# Patient Record
Sex: Female | Born: 1940
Health system: Southern US, Community
[De-identification: ages and names within clinical notes are randomized; demographics above are authoritative.]

## PROBLEM LIST (undated history)

## (undated) DIAGNOSIS — M48062 Spinal stenosis, lumbar region with neurogenic claudication: Secondary | ICD-10-CM

## (undated) DIAGNOSIS — K1379 Other lesions of oral mucosa: Secondary | ICD-10-CM

## (undated) DIAGNOSIS — Z9889 Other specified postprocedural states: Secondary | ICD-10-CM

## (undated) DIAGNOSIS — M79606 Pain in leg, unspecified: Secondary | ICD-10-CM

## (undated) DIAGNOSIS — F419 Anxiety disorder, unspecified: Secondary | ICD-10-CM

## (undated) DIAGNOSIS — C4490 Unspecified malignant neoplasm of skin, unspecified: Secondary | ICD-10-CM

## (undated) DIAGNOSIS — M542 Cervicalgia: Secondary | ICD-10-CM

## (undated) DIAGNOSIS — I10 Essential (primary) hypertension: Secondary | ICD-10-CM

## (undated) DIAGNOSIS — M545 Low back pain, unspecified: Secondary | ICD-10-CM

## (undated) DIAGNOSIS — M719 Bursopathy, unspecified: Secondary | ICD-10-CM

## (undated) DIAGNOSIS — G43909 Migraine, unspecified, not intractable, without status migrainosus: Secondary | ICD-10-CM

## (undated) DIAGNOSIS — R112 Nausea with vomiting, unspecified: Secondary | ICD-10-CM

## (undated) DIAGNOSIS — H269 Unspecified cataract: Secondary | ICD-10-CM

## (undated) DIAGNOSIS — K219 Gastro-esophageal reflux disease without esophagitis: Secondary | ICD-10-CM

## (undated) DIAGNOSIS — M7580 Other shoulder lesions, unspecified shoulder: Secondary | ICD-10-CM

## (undated) DIAGNOSIS — T8859XA Other complications of anesthesia, initial encounter: Secondary | ICD-10-CM

## (undated) DIAGNOSIS — E039 Hypothyroidism, unspecified: Secondary | ICD-10-CM

## (undated) DIAGNOSIS — E079 Disorder of thyroid, unspecified: Secondary | ICD-10-CM

## (undated) DIAGNOSIS — M438X9 Other specified deforming dorsopathies, site unspecified: Secondary | ICD-10-CM

## (undated) DIAGNOSIS — M4120 Other idiopathic scoliosis, site unspecified: Secondary | ICD-10-CM

## (undated) DIAGNOSIS — Z8489 Family history of other specified conditions: Secondary | ICD-10-CM

## (undated) DIAGNOSIS — G8929 Other chronic pain: Secondary | ICD-10-CM

## (undated) DIAGNOSIS — T4145XA Adverse effect of unspecified anesthetic, initial encounter: Secondary | ICD-10-CM

## (undated) DIAGNOSIS — C029 Malignant neoplasm of tongue, unspecified: Secondary | ICD-10-CM

## (undated) DIAGNOSIS — N8110 Cystocele, unspecified: Secondary | ICD-10-CM

## (undated) DIAGNOSIS — M1990 Unspecified osteoarthritis, unspecified site: Secondary | ICD-10-CM

## (undated) DIAGNOSIS — H9190 Unspecified hearing loss, unspecified ear: Secondary | ICD-10-CM

## (undated) DIAGNOSIS — M549 Dorsalgia, unspecified: Secondary | ICD-10-CM

## (undated) DIAGNOSIS — M5416 Radiculopathy, lumbar region: Secondary | ICD-10-CM

## (undated) HISTORY — DX: Other specified deforming dorsopathies, site unspecified: M43.8X9

## (undated) HISTORY — PX: TONGUE SURGERY: SHX810

## (undated) HISTORY — DX: Unspecified hearing loss, unspecified ear: H91.90

## (undated) HISTORY — DX: Bursopathy, unspecified: M71.9

## (undated) HISTORY — PX: OTHER SURGICAL HISTORY: SHX169

## (undated) HISTORY — PX: NASAL POLYP EXCISION: SHX2068

## (undated) HISTORY — PX: APPENDECTOMY: SHX54

## (undated) HISTORY — PX: CATARACT EXTRACTION, BILATERAL: SHX1313

## (undated) HISTORY — DX: Cervicalgia: M54.2

## (undated) HISTORY — DX: Dorsalgia, unspecified: M54.9

## (undated) HISTORY — DX: Spinal stenosis, lumbar region with neurogenic claudication: M48.062

## (undated) HISTORY — DX: Unspecified cataract: H26.9

## (undated) HISTORY — DX: Cystocele, unspecified: N81.10

## (undated) HISTORY — DX: Other shoulder lesions, unspecified shoulder: M75.80

## (undated) HISTORY — DX: Gastro-esophageal reflux disease without esophagitis: K21.9

## (undated) HISTORY — PX: BACK SURGERY: SHX140

## (undated) HISTORY — PX: TONSILLECTOMY: SUR1361

## (undated) HISTORY — PX: ABDOMINOPLASTY: SUR9

## (undated) HISTORY — DX: Hypothyroidism, unspecified: E03.9

## (undated) HISTORY — DX: Unspecified osteoarthritis, unspecified site: M19.90

## (undated) HISTORY — DX: Other lesions of oral mucosa: K13.79

## (undated) HISTORY — DX: Radiculopathy, lumbar region: M54.16

## (undated) HISTORY — PX: COLONOSCOPY: SHX174

## (undated) HISTORY — DX: Pain in leg, unspecified: M79.606

## (undated) HISTORY — DX: Disorder of thyroid, unspecified: E07.9

## (undated) HISTORY — PX: CARPAL TUNNEL RELEASE: SHX101

## (undated) HISTORY — DX: Other idiopathic scoliosis, site unspecified: M41.20

## (undated) HISTORY — DX: Essential (primary) hypertension: I10

## (undated) HISTORY — PX: ABDOMINAL HYSTERECTOMY: SHX81

---

## 1978-02-26 HISTORY — PX: BREAST SURGERY: SHX581

## 1986-02-26 HISTORY — PX: LUMBAR SPINE SURGERY: SHX701

## 1998-06-20 ENCOUNTER — Other Ambulatory Visit: Admission: RE | Admit: 1998-06-20 | Discharge: 1998-06-20 | Payer: Self-pay | Admitting: Otolaryngology

## 1999-03-20 ENCOUNTER — Other Ambulatory Visit: Admission: RE | Admit: 1999-03-20 | Discharge: 1999-03-20 | Payer: Self-pay | Admitting: Family Medicine

## 1999-09-23 ENCOUNTER — Emergency Department (HOSPITAL_COMMUNITY): Admission: EM | Admit: 1999-09-23 | Discharge: 1999-09-23 | Payer: Self-pay

## 1999-11-13 ENCOUNTER — Other Ambulatory Visit: Admission: RE | Admit: 1999-11-13 | Discharge: 1999-11-13 | Payer: Self-pay | Admitting: Gastroenterology

## 1999-11-13 ENCOUNTER — Encounter (INDEPENDENT_AMBULATORY_CARE_PROVIDER_SITE_OTHER): Payer: Self-pay

## 1999-12-06 ENCOUNTER — Emergency Department (HOSPITAL_COMMUNITY): Admission: EM | Admit: 1999-12-06 | Discharge: 1999-12-06 | Payer: Self-pay | Admitting: Emergency Medicine

## 1999-12-06 ENCOUNTER — Encounter: Payer: Self-pay | Admitting: Emergency Medicine

## 1999-12-07 ENCOUNTER — Encounter: Payer: Self-pay | Admitting: Emergency Medicine

## 1999-12-07 ENCOUNTER — Ambulatory Visit (HOSPITAL_COMMUNITY): Admission: RE | Admit: 1999-12-07 | Discharge: 1999-12-07 | Payer: Self-pay | Admitting: Emergency Medicine

## 1999-12-12 ENCOUNTER — Inpatient Hospital Stay (HOSPITAL_COMMUNITY): Admission: RE | Admit: 1999-12-12 | Discharge: 1999-12-15 | Payer: Self-pay | Admitting: Neurosurgery

## 1999-12-12 ENCOUNTER — Encounter: Payer: Self-pay | Admitting: Neurosurgery

## 1999-12-13 ENCOUNTER — Encounter: Payer: Self-pay | Admitting: Neurosurgery

## 2000-06-26 ENCOUNTER — Other Ambulatory Visit: Admission: RE | Admit: 2000-06-26 | Discharge: 2000-06-26 | Payer: Self-pay | Admitting: Family Medicine

## 2003-07-14 ENCOUNTER — Other Ambulatory Visit: Admission: RE | Admit: 2003-07-14 | Discharge: 2003-07-14 | Payer: Self-pay | Admitting: Family Medicine

## 2004-06-29 ENCOUNTER — Ambulatory Visit: Payer: Self-pay | Admitting: Family Medicine

## 2004-07-06 ENCOUNTER — Ambulatory Visit: Payer: Self-pay | Admitting: Family Medicine

## 2004-07-06 ENCOUNTER — Other Ambulatory Visit: Admission: RE | Admit: 2004-07-06 | Discharge: 2004-07-06 | Payer: Self-pay | Admitting: Family Medicine

## 2004-10-19 ENCOUNTER — Ambulatory Visit: Payer: Self-pay | Admitting: Family Medicine

## 2005-07-02 ENCOUNTER — Ambulatory Visit: Payer: Self-pay | Admitting: Family Medicine

## 2005-07-02 ENCOUNTER — Emergency Department (HOSPITAL_COMMUNITY): Admission: EM | Admit: 2005-07-02 | Discharge: 2005-07-03 | Payer: Self-pay | Admitting: Emergency Medicine

## 2005-07-05 ENCOUNTER — Ambulatory Visit: Payer: Self-pay | Admitting: Family Medicine

## 2005-07-19 ENCOUNTER — Ambulatory Visit: Payer: Self-pay | Admitting: Family Medicine

## 2005-07-26 ENCOUNTER — Other Ambulatory Visit: Admission: RE | Admit: 2005-07-26 | Discharge: 2005-07-26 | Payer: Self-pay | Admitting: Family Medicine

## 2005-07-26 ENCOUNTER — Encounter: Payer: Self-pay | Admitting: Family Medicine

## 2005-07-26 ENCOUNTER — Ambulatory Visit: Payer: Self-pay | Admitting: Family Medicine

## 2005-07-26 LAB — CONVERTED CEMR LAB

## 2005-10-11 ENCOUNTER — Ambulatory Visit: Payer: Self-pay | Admitting: Family Medicine

## 2005-10-17 ENCOUNTER — Ambulatory Visit: Payer: Self-pay | Admitting: Cardiology

## 2005-10-31 ENCOUNTER — Ambulatory Visit: Payer: Self-pay | Admitting: Family Medicine

## 2006-07-11 ENCOUNTER — Ambulatory Visit: Payer: Self-pay | Admitting: Family Medicine

## 2006-08-15 ENCOUNTER — Encounter: Payer: Self-pay | Admitting: Family Medicine

## 2006-08-15 ENCOUNTER — Ambulatory Visit: Payer: Self-pay | Admitting: Family Medicine

## 2006-08-15 DIAGNOSIS — G5600 Carpal tunnel syndrome, unspecified upper limb: Secondary | ICD-10-CM | POA: Insufficient documentation

## 2006-08-15 LAB — CONVERTED CEMR LAB
ALT: 18 units/L (ref 0–40)
AST: 24 units/L (ref 0–37)
Albumin: 4.2 g/dL (ref 3.5–5.2)
Alkaline Phosphatase: 67 units/L (ref 39–117)
BUN: 12 mg/dL (ref 6–23)
Basophils Absolute: 0.1 10*3/uL (ref 0.0–0.1)
Basophils Relative: 1.1 % — ABNORMAL HIGH (ref 0.0–1.0)
Bilirubin, Direct: 0.1 mg/dL (ref 0.0–0.3)
CO2: 30 meq/L (ref 19–32)
Calcium: 9.5 mg/dL (ref 8.4–10.5)
Chloride: 106 meq/L (ref 96–112)
Cholesterol: 222 mg/dL (ref 0–200)
Creatinine, Ser: 0.7 mg/dL (ref 0.4–1.2)
Direct LDL: 154 mg/dL
Eosinophils Absolute: 0.7 10*3/uL — ABNORMAL HIGH (ref 0.0–0.6)
Eosinophils Relative: 9.1 % — ABNORMAL HIGH (ref 0.0–5.0)
GFR calc Af Amer: 109 mL/min
GFR calc non Af Amer: 90 mL/min
Glucose, Bld: 80 mg/dL (ref 70–99)
HCT: 42 % (ref 36.0–46.0)
HDL: 41.7 mg/dL (ref >39.0)
Hemoglobin: 14.2 g/dL (ref 12.0–15.0)
Lymphocytes Relative: 21.3 % (ref 12.0–46.0)
MCHC: 33.8 g/dL (ref 30.0–36.0)
MCV: 89.1 fL (ref 78.0–100.0)
Monocytes Absolute: 0.7 10*3/uL (ref 0.2–0.7)
Monocytes Relative: 9.7 % (ref 3.0–11.0)
Neutro Abs: 4.2 10*3/uL (ref 1.4–7.7)
Neutrophils Relative %: 58.8 % (ref 43.0–77.0)
Platelets: 331 10*3/uL (ref 150–400)
Potassium: 3.5 meq/L (ref 3.5–5.1)
RBC: 4.71 M/uL (ref 3.87–5.11)
RDW: 12.8 % (ref 11.5–14.6)
Sodium: 141 meq/L (ref 135–145)
TSH: 2.06 microintl units/mL (ref 0.35–5.50)
Total Bilirubin: 0.6 mg/dL (ref 0.3–1.2)
Total CHOL/HDL Ratio: 5.3
Total Protein: 8 g/dL (ref 6.0–8.3)
Triglycerides: 115 mg/dL (ref 0–149)
VLDL: 23 mg/dL (ref 0–40)
Vit D, 1,25-Dihydroxy: 44 (ref 20–57)
WBC: 7.2 10*3/uL (ref 4.5–10.5)

## 2006-09-20 ENCOUNTER — Telehealth: Payer: Self-pay | Admitting: Family Medicine

## 2006-09-24 ENCOUNTER — Telehealth: Payer: Self-pay | Admitting: Family Medicine

## 2006-12-05 ENCOUNTER — Encounter: Payer: Self-pay | Admitting: Family Medicine

## 2007-08-14 ENCOUNTER — Ambulatory Visit: Payer: Self-pay | Admitting: Family Medicine

## 2007-08-14 LAB — CONVERTED CEMR LAB
Bilirubin Urine: NEGATIVE
Glucose, Urine, Semiquant: NEGATIVE
Ketones, urine, test strip: NEGATIVE
Specific Gravity, Urine: 1.01
pH: 6.5

## 2007-08-20 ENCOUNTER — Ambulatory Visit: Payer: Self-pay | Admitting: Family Medicine

## 2007-08-20 DIAGNOSIS — R51 Headache: Secondary | ICD-10-CM | POA: Insufficient documentation

## 2007-08-20 DIAGNOSIS — R609 Edema, unspecified: Secondary | ICD-10-CM | POA: Insufficient documentation

## 2007-08-20 DIAGNOSIS — E039 Hypothyroidism, unspecified: Secondary | ICD-10-CM | POA: Insufficient documentation

## 2007-08-20 DIAGNOSIS — R519 Headache, unspecified: Secondary | ICD-10-CM | POA: Insufficient documentation

## 2007-08-20 DIAGNOSIS — H8309 Labyrinthitis, unspecified ear: Secondary | ICD-10-CM | POA: Insufficient documentation

## 2007-08-20 LAB — CONVERTED CEMR LAB
Albumin: 3.8 g/dL (ref 3.5–5.2)
Bilirubin, Direct: 0.1 mg/dL (ref 0.0–0.3)
Calcium: 9.3 mg/dL (ref 8.4–10.5)
Cholesterol: 192 mg/dL (ref 0–200)
Eosinophils Absolute: 0.7 10*3/uL (ref 0.0–0.7)
GFR calc Af Amer: 93 mL/min
GFR calc non Af Amer: 77 mL/min
Glucose, Bld: 85 mg/dL (ref 70–99)
HCT: 41.7 % (ref 36.0–46.0)
HDL: 34.8 mg/dL — ABNORMAL LOW (ref >39.0)
Hemoglobin: 14.3 g/dL (ref 12.0–15.0)
MCHC: 34.3 g/dL (ref 30.0–36.0)
MCV: 89.1 fL (ref 78.0–100.0)
Monocytes Absolute: 0.7 10*3/uL (ref 0.1–1.0)
Monocytes Relative: 8.8 % (ref 3.0–12.0)
Neutro Abs: 4.9 10*3/uL (ref 1.4–7.7)
RDW: 12.4 % (ref 11.5–14.6)
Sodium: 139 meq/L (ref 135–145)
Total CHOL/HDL Ratio: 5.5
Total Protein: 7 g/dL (ref 6.0–8.3)
Triglycerides: 109 mg/dL (ref 0–149)

## 2008-06-28 ENCOUNTER — Ambulatory Visit: Payer: Self-pay | Admitting: Family Medicine

## 2008-06-28 DIAGNOSIS — M542 Cervicalgia: Secondary | ICD-10-CM | POA: Insufficient documentation

## 2008-06-28 DIAGNOSIS — M1990 Unspecified osteoarthritis, unspecified site: Secondary | ICD-10-CM | POA: Insufficient documentation

## 2008-06-28 DIAGNOSIS — I10 Essential (primary) hypertension: Secondary | ICD-10-CM | POA: Insufficient documentation

## 2008-07-15 ENCOUNTER — Ambulatory Visit: Payer: Self-pay | Admitting: Family Medicine

## 2008-07-15 DIAGNOSIS — M479 Spondylosis, unspecified: Secondary | ICD-10-CM | POA: Insufficient documentation

## 2008-07-17 ENCOUNTER — Encounter: Admission: RE | Admit: 2008-07-17 | Discharge: 2008-07-17 | Payer: Self-pay | Admitting: Family Medicine

## 2008-07-22 ENCOUNTER — Telehealth: Payer: Self-pay | Admitting: Family Medicine

## 2008-08-18 ENCOUNTER — Ambulatory Visit: Payer: Self-pay | Admitting: Family Medicine

## 2008-08-18 LAB — CONVERTED CEMR LAB
Bilirubin Urine: NEGATIVE
Glucose, Urine, Semiquant: NEGATIVE
pH: 6.5

## 2008-08-25 ENCOUNTER — Other Ambulatory Visit: Admission: RE | Admit: 2008-08-25 | Discharge: 2008-08-25 | Payer: Self-pay | Admitting: Family Medicine

## 2008-08-25 ENCOUNTER — Ambulatory Visit: Payer: Self-pay | Admitting: Family Medicine

## 2008-08-25 ENCOUNTER — Encounter: Payer: Self-pay | Admitting: Family Medicine

## 2008-08-25 DIAGNOSIS — R635 Abnormal weight gain: Secondary | ICD-10-CM | POA: Insufficient documentation

## 2008-08-25 DIAGNOSIS — R252 Cramp and spasm: Secondary | ICD-10-CM | POA: Insufficient documentation

## 2008-08-25 DIAGNOSIS — G4700 Insomnia, unspecified: Secondary | ICD-10-CM | POA: Insufficient documentation

## 2008-08-26 LAB — CONVERTED CEMR LAB
BUN: 22 mg/dL (ref 6–23)
Basophils Relative: 0.1 % (ref 0.0–3.0)
Bilirubin, Direct: 0 mg/dL (ref 0.0–0.3)
CO2: 29 meq/L (ref 19–32)
Chloride: 104 meq/L (ref 96–112)
Cholesterol: 228 mg/dL — ABNORMAL HIGH (ref 0–200)
Creatinine, Ser: 0.8 mg/dL (ref 0.4–1.2)
Direct LDL: 177.5 mg/dL
Eosinophils Absolute: 0.8 10*3/uL — ABNORMAL HIGH (ref 0.0–0.7)
Glucose, Bld: 90 mg/dL (ref 70–99)
MCHC: 35 g/dL (ref 30.0–36.0)
MCV: 88.5 fL (ref 78.0–100.0)
Monocytes Absolute: 0.6 10*3/uL (ref 0.1–1.0)
Neutrophils Relative %: 62.1 % (ref 43.0–77.0)
Platelets: 292 10*3/uL (ref 150.0–400.0)
Total Bilirubin: 0.6 mg/dL (ref 0.3–1.2)
Total Protein: 7.1 g/dL (ref 6.0–8.3)

## 2008-09-03 ENCOUNTER — Encounter: Payer: Self-pay | Admitting: Family Medicine

## 2008-09-07 ENCOUNTER — Encounter: Payer: Self-pay | Admitting: Family Medicine

## 2008-10-12 ENCOUNTER — Encounter: Payer: Self-pay | Admitting: Family Medicine

## 2009-09-08 ENCOUNTER — Ambulatory Visit: Payer: Self-pay | Admitting: Family Medicine

## 2009-09-08 DIAGNOSIS — T50995A Adverse effect of other drugs, medicaments and biological substances, initial encounter: Secondary | ICD-10-CM | POA: Insufficient documentation

## 2009-09-08 DIAGNOSIS — E559 Vitamin D deficiency, unspecified: Secondary | ICD-10-CM | POA: Insufficient documentation

## 2009-09-08 DIAGNOSIS — E785 Hyperlipidemia, unspecified: Secondary | ICD-10-CM | POA: Insufficient documentation

## 2009-09-08 DIAGNOSIS — R350 Frequency of micturition: Secondary | ICD-10-CM | POA: Insufficient documentation

## 2009-09-08 DIAGNOSIS — D649 Anemia, unspecified: Secondary | ICD-10-CM | POA: Insufficient documentation

## 2009-09-08 LAB — CONVERTED CEMR LAB
Glucose, Urine, Semiquant: NEGATIVE
Specific Gravity, Urine: 1.015
WBC Urine, dipstick: NEGATIVE
pH: 7

## 2009-09-13 LAB — CONVERTED CEMR LAB
Albumin: 4.3 g/dL (ref 3.5–5.2)
BUN: 16 mg/dL (ref 6–23)
Basophils Absolute: 0.1 10*3/uL (ref 0.0–0.1)
CO2: 29 meq/L (ref 19–32)
Chloride: 100 meq/L (ref 96–112)
Direct LDL: 184.3 mg/dL
Eosinophils Absolute: 0.6 10*3/uL (ref 0.0–0.7)
GFR calc non Af Amer: 77.18 mL/min (ref >60)
Glucose, Bld: 77 mg/dL (ref 70–99)
HCT: 42.9 % (ref 36.0–46.0)
HDL: 45.2 mg/dL (ref >39.00)
Lymphs Abs: 1.7 10*3/uL (ref 0.7–4.0)
MCHC: 34.8 g/dL (ref 30.0–36.0)
MCV: 86.2 fL (ref 78.0–100.0)
Monocytes Absolute: 0.7 10*3/uL (ref 0.1–1.0)
Neutro Abs: 6.1 10*3/uL (ref 1.4–7.7)
Platelets: 325 10*3/uL (ref 150.0–400.0)
Potassium: 5.2 meq/L — ABNORMAL HIGH (ref 3.5–5.1)
RDW: 13.6 % (ref 11.5–14.6)
TSH: 2.15 microintl units/mL (ref 0.35–5.50)
Total Bilirubin: 0.7 mg/dL (ref 0.3–1.2)

## 2009-10-19 ENCOUNTER — Encounter: Payer: Self-pay | Admitting: Family Medicine

## 2010-03-28 NOTE — Assessment & Plan Note (Signed)
Summary: PT WILL COME IN FASTING/NJR   Vital Signs:  Patient profile:   70 year old female Menstrual status:  hysterectomy Height:      67 inches Weight:      188 pounds BMI:     29.55 O2 Sat:      98 % Temp:     97.9 degrees F Pulse rate:   100 / minute Pulse rhythm:   regular BP sitting:   130 / 96  (left arm) Cuff size:   large  Vitals Entered By: Pura Spice, RN (September 08, 2009 10:44 AM) CC: refills discuss problems fasting for labs    History of Present Illness: This 70 year old white married female is in to discuss her medical problems, refill meds her medications but note Pap smear this year was normal 2010 had a hysterectomy 2008 Patient relates she continues to have neck pain but not as severe and has in the past Continues to have low back pain said surgery in the past by Dr. Gwynneth Munson. Takes ibuprofen 800 mg 3 times daily after meals when needed for back pain Takes Prilosec when needed for GERD Past problem with chronic labyrinthitis and takes meclizine 25 mg t.i.d. as needed. 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 Codeine  Past History:  Past Medical History: Last updated: 06/28/2008 Osteoarthritis Hypertension Hypothyroidism  Past Surgical History: Last updated: 08/20/2007 Lump from the Breast Appendectomy Hysterectomy  dr dr Nathaneil Canary  2008 Tonsillectomy  Past History:  Care Management: Dermatology: Terri Piedra Ophthalmology: Dr Hazle Quant   Review of Systems      See HPI  The patient denies anorexia, fever, weight loss, weight gain, vision loss, decreased hearing, hoarseness, chest pain, syncope, dyspnea on exertion, peripheral edema, prolonged cough, headaches, hemoptysis, abdominal pain, melena, hematochezia, severe indigestion/heartburn, hematuria, incontinence, genital  sores, muscle weakness, suspicious skin lesions, transient blindness, difficulty walking, depression, unusual weight change, abnormal bleeding, enlarged lymph nodes, angioedema, breast masses, and testicular masses.    Physical Exam  General:  Well-developed,well-nourished,in no acute distress; alert,appropriate and cooperative throughout examination Head:  Normocephalic and atraumatic without obvious abnormalities. No apparent alopecia or balding. Eyes:  No corneal or conjunctival inflammation noted. EOMI. Perrla. Funduscopic exam benign, without hemorrhages, exudates or papilledema. Vision grossly normal. Ears:  External ear exam shows no significant lesions or deformities.  Otoscopic examination reveals clear canals, tympanic membranes are intact bilaterally without bulging, retraction, inflammation or discharge. Hearing is grossly normal bilaterally. Nose:  External nasal examination shows no deformity or inflammation. Nasal mucosa are pink and moist without lesions or exudates. Mouth:  Oral mucosa and oropharynx without lesions or exudates.  Teeth in good repair. Neck:  No deformities, masses, or tenderness noted. Chest Wall:  No deformities, masses, or tenderness noted. Breasts:  No mass, nodules, thickening, tenderness, bulging, retraction, inflamation, nipple discharge or skin changes noted.   Lungs:  Normal respiratory effort, chest expands symmetrically. Lungs are clear to auscultation, no crackles or wheezes. Heart:  Normal rate and regular rhythm. S1 and S2 normal without gallop, murmur, click, rub or other extra sounds. Abdomen:  Bowel sounds positive,abdomen soft and non-tender without masses, organomegaly or hernias noted. Rectal:  not examined this year Genitalia:  not examined this year Msk:  No deformity or scoliosis noted of thoracic or lumbar spine.  continues to have some tenderness over her lateral cervical spine C2-7 Pulses:  R and L carotid,radial,femoral,dorsalis pedis  and posterior tibial pulses are full and equal bilaterally Extremities:  No clubbing, cyanosis, edema, or deformity noted with normal full range of motion of all joints.   Neurologic:  No cranial nerve deficits noted. Station and gait are normal. Plantar reflexes are down-going bilaterally. DTRs are symmetrical throughout. Sensory, motor and coordinative functions appear intact. Skin:  Intact without suspicious lesions or rashes Cervical Nodes:  No lymphadenopathy noted Axillary Nodes:  No palpable lymphadenopathy Inguinal Nodes:  No significant adenopathy   Impression & Recommendations:  Problem # 1:  FREQUENCY, URINARY (ICD-788.41) Assessment Unchanged negative urinalysis Orders: UA Dipstick w/o Micro (automated)  (81003)  Problem # 2:  HYPERLIPIDEMIA (ICD-272.4) Assessment: Improved  Orders: TLB-Lipid Panel (80061-LIPID) TLB-Hepatic/Liver Function Pnl (80076-HEPATIC) Prescription Created Electronically [phone])  Problem # 3:  ANEMIA (ICD-285.9) Assessment: Improved  Orders: TLB-CBC Platelet - w/Differential (85025-CBCD)  Problem # 4:  WEIGHT GAIN (ICD-783.1) Assessment: Deteriorated  Problem # 5:  DEGENERATIVE JOINT DISEASE, CERVICAL SPINE (ICD-721.90) Assessment: Improved  Problem # 6:  HYPERTENSION (ICD-401.9) Assessment: Improved  Her updated medication list for this problem includes:    Triamterene-hctz 75-50 Mg Tabs (Triamterene-hctz) .Marland Kitchen... Take 1 tablet by mouth once a day  Orders: EKG w/ Interpretation (93000)no evidence of ischemia  Problem # 7:  EDEMA (ICD-782.3) Assessment: Improved  Her updated medication list for this problem includes:    Triamterene-hctz 75-50 Mg Tabs (Triamterene-hctz) .Marland Kitchen... Take 1 tablet by mouth once a day  Problem # 8:  OSTEOARTHRITIS (ICD-715.90) Assessment: Improved  The following medications were removed from the medication list:    Diclofenac Sodium 75 Mg Tbec (Diclofenac sodium) .Marland Kitchen... 1 bid Her updated medication list  for this problem includes:    Ibuprofen 800 Mg Tabs (Ibuprofen) .Marland Kitchen... 1 three times a day pc gfor arthritis  Orders: T-Vitamin D (25-Hydroxy) (60454-09811)  Problem # 9:  LABYRINTHITIS, CHRONIC (ICD-386.30) Assessment: Unchanged meclizine 25 mg t.i.d.  Problem # 10:  HORMONE REPLACEMENT THERAPY (ICD-V07.4) Assessment: Improved  Complete Medication List: 1)  Meclizine Hcl 25 Mg Tabs (Meclizine hcl) .... Take 1 tablet three times a day for dizziness 2)  Sm Calcium/vitamin D 500-200 Mg-unit Tabs (Calcium carbonate-vitamin d) .... Take 2 tablet by mouth once a day 3)  Synthroid 50 Mcg Tabs (Levothyroxine sodium) .... Take 1 tablet by mouth once a day 4)  Triamterene-hctz 75-50 Mg Tabs (Triamterene-hctz) .... Take 1 tablet by mouth once a day 5)  Estratest 1.25-2.5  .Marland Kitchen.. 1 qd 6)  Lotrisone 1-0.05 % Crea (Clotrimazole-betamethasone) .... Apply bid 7)  Flexeril 10 Mg Tabs (Cyclobenzaprine hcl) .... Three times a day as needed spasm 8)  Ibuprofen 800 Mg Tabs (Ibuprofen) .Marland Kitchen.. 1 three times a day pc gfor arthritis 9)  Phenteramine 37.5  .Marland Kitchen.. 1 qam to decrease appetite  Other Orders: Venipuncture (91478) TLB-BMP (Basic Metabolic Panel-BMET) (80048-METABOL) TLB-TSH (Thyroid Stimulating Hormone) (84443-TSH)  Patient Instructions: 1)  in general physical examination reveals E. to be a healthy female but to continue the medications you're taking enough to be doing very well but you need to continue to watch her weight and increase her activity or exercise if you can Prescriptions: ESTRATEST 1.25-2.5 1 qd  #30 x 11   Entered and Authorized by:   Judithann Sheen MD   Signed by:   Judithann Sheen MD on 09/08/2009   Method used:   Print then Give to  Patient   RxID:   [id number] PHENTERAMINE 37.5 1 qAM to decrease appetite  #30 x 3   Entered and Authorized by:   Judithann Sheen MD   Signed by:   Judithann Sheen MD on 09/08/2009   Method used:   Print then Give to  Patient   RxID:   [id number] IBUPROFEN 800 MG TABS (IBUPROFEN) 1 three times a day pc gfor arthritis  #90 x 11   Entered and Authorized by:   Judithann Sheen MD   Signed by:   Judithann Sheen MD on 09/08/2009   Method used:   Electronically to        [facility]  [id number] (retail)       [address]       [place], [place]  [address]       Ph: [phone] or [phone]       Fax: [phone]   RxID:   [id number] FLEXERIL 10 MG TABS (CYCLOBENZAPRINE HCL) three times a day as needed spasm  #60 x 11   Entered and Authorized by:   Judithann Sheen MD   Signed by:   Judithann Sheen MD on 09/08/2009   Method used:   Electronically to        [id number] (retail)       [address]       [place], [place]  [address]       Ph: [phone] or [phone]       Fax: [phone]   RxID:   [id number] TRIAMTERENE-HCTZ 75-50 MG TABS (TRIAMTERENE-HCTZ) Take 1 tablet by mouth once a day  #30 x 11   Entered and Authorized by:   Judithann Sheen MD   Signed by:   Judithann Sheen MD on 09/08/2009   Method used:   Electronically to        [id number] ([facility])       [address]       [place], [place]  [address]       Ph: [phone] or [phone]       Fax: [phone]   RxID:   [id number] SYNTHROID 50 MCG TABS (LEVOTHYROXINE SODIUM) Take 1 tablet by mouth once a day  #30 x 11   Entered and Authorized by:   Judithann Sheen MD   Signed by:   Judithann Sheen MD on 09/08/2009   Method used:   Electronically to        [id number] ([facility]       [address]       [place], [place]  [address]       Ph: [phone] or [phone]       Fax: [phone]   RxID:   [id number] MECLIZINE HCL 25 MG TABS (MECLIZINE HCL) Take 1 tablet three times a day for dizziness  #60 x 11   Entered and Authorized by:   Judithann Sheen MD   Signed by:   Judithann Sheen MD on 09/08/2009   Method used:   Electronically to        [id number] ([facility]       [address]  [place], [place]  [address]       Ph: [phone] or [phone]       Fax: [phone]   RxID:   [id number]    Eye Exam  Dr Hazle Quant  Visit 08/2009     Laboratory Results   Urine Tests    Routine Urinalysis   Color: yellow Appearance: Clear Glucose: negative   (Normal Range: Negative) Bilirubin: negative   (Normal Range: Negative) Ketone: negative   (Normal Range: Negative) Spec. Gravity: 1.015   (Normal Range: 1.003-1.035) Blood: 1+   (Normal Range: Negative) pH: 7.0   (Normal Range: 5.0-8.0) Protein: negative   (Normal Range: Negative) Urobilinogen: 0.2   (Normal Range: 0-1) Nitrite: negative   (Normal Range: Negative) Leukocyte Esterace: negative   (Normal Range: Negative)    Comments: Rita Ohara  September 08, 2009 11:50 AM

## 2010-07-14 NOTE — H&P (Signed)
[place]  Patient:    Stacy Wagner, Stacy Wagner                    MRN: [id number] Adm. Date:  [id number] Attending:  Danella Penton                         History and Physical  HISTORY OF PRESENT ILLNESS:  Stacy Wagner is a lady who was seen by me yesterday afternoon in my office because of right hip and right leg pain.  It seemed that this lady had been having pain for almost three months, but since last week, the pain is getting worse.  She is unable to sleep.  She has been talking all sorts of medication, including a heavy, strong medication without any improvement.  She denies any pain in the left leg.  Patient had an MRI and I saw her as an emergency.  The pain goes from her back down to the right hip and then to groin, and to the knee, sometimes into the leg.  She is miserable. She denies any problem with bladder or bowel.  PAST MEDICAL HISTORY:  Tonsillectomy, appendectomy, benign tumor removed from the breast.  ALLERGIES:  She is not allergic to any medications.  MEDICATIONS:  She is taking medication for high blood pressure and thyroid.  SOCIAL HISTORY:  Patient does not smoke or drink.  HEIGHT AND WEIGHT:  She is 5 feet 8 inches and she weighs 225 pounds.  FAMILY HISTORY:  There is a history of heart disease and high blood pressure i her family.  REVIEW OF SYSTEMS:  Is positive for a hearing loss, ringing in the ear, high blood pressure.  PHYSICAL EXAMINATION:  GENERAL:  Patient came to my office and she was quite miserable.  It was almost impossible to do a complete physical examination on her because of the amount of pain she was having.  She was unable to get onto the examining room table up to the point that her husband and I had to help.  HEENT:  Normal.  NECK:  Normal.  LUNGS:  Normal.  CARDIOVASCULAR:  Normal.  ABDOMEN:  Normal.  NEUROLOGIC:  Mental status normal.  Cranial nerves normal.  Motor examination in  the left leg is completely normal.  In the right leg, I can break the iliopsoas and the quadriceps, and she has very mild weak dorsiflexion of the right foot.  Left leg is negative.  The right quadriceps is a bit smaller in comparison to the left one, although she is right-handed.  Femoral stretch maneuver in the left side is negative, but is highly positive in the right side, up to the point that the patient was screaming.  Sensation seemed to be normal.  Reflexes symmetrical.  LABORATORY DATA:  The MRI showed that, indeed, the lady has a degenerative disk disease at multiple levels, with some foraminal stenosis, but at the level of 2-3 in the right side she has a large herniated disk with displacement of the thecal sac and compromise of the L2 and L3 nerve roots.  CLINICAL IMPRESSION: 1. Right L2-L3 herniated disk. 2. Degenerative disk disease.  RECOMMENDATIONS:  The patient wants to go ahead with surgery.  She knows about the risks, such as infection, CSF leak, worsening of the pain, paralysis, and need for further surgery.  She is fully aware that the surgery is to help with the pain down through the  right leg, but most likely is not going to help her with the back pain that is secondary to degenerative disk disease. DD:  12/12/99 TD:  12/12/99 Job: [id number]

## 2010-07-14 NOTE — Op Note (Signed)
. [facility]  Patient:    Stacy Wagner, Stacy Wagner                    MRN: [id number] Proc. Date: 12/12/99 Adm. Date:  [id number] Attending:  Danella Penton                           Operative Report  PREOPERATIVE DIAGNOSIS:  Right L2-L3 herniated disk with a fragment, degenerative disk disease at multiple levels.  POSTOPERATIVE DIAGNOSIS:  Right L2-L3 herniated disk with a fragment, degenerative disk disease at multiple levels.  OPERATION PERFORMED:  Right L2-3 diskectomy, foraminotomy decompression of L3 nerve root.  MICROSCOPE:  Midas Rex.  SURGEON:  Tanya Nones. Jeral Fruit, M.D.  ASSISTANT:  Hewitt Shorts, M.D.  ANESTHESIA:  INDICATIONS FOR PROCEDURE:  Stacy Wagner is a 70 year old female complaining of back and right leg pain which is getting worse for the past few weeks.  Since last week, the patient has been unable to sleep.  MRI showed that she had multiple level degenerative disk disease.  At the level of 2-3 on the right side, she has a large herniated disk.  Surgery was advised.  The patient wanted to go ahead as soon as possible because of the pain.  She had weakening of the quadriceps and the iliopsoas.  The patient knew of the risks such as CSF leak, need for further surgery, paralysis and infection.  Also she knew that the procedure was to get rid of the pain down to the right leg but her degenerative disk disease would continue giving her back problems.  DESCRIPTION OF PROCEDURE:  The patient was taken to the operating room and she was positioned in a prone manner.  The back was prepped with Betadine.  Drapes were applied.  We counted the spinous process although it was quite difficult because of the patient.  Nevertheless we did an x-ray that showed indeed we were at the level of spinal stenosis of L2.  We opened the right side and we identified the L2-L3 space.  We did a hemilaminectomy L3.  We brought the microscope into  the area and we removed the yellow ligament.  With the Midas Rex, we drilled medially to ____________ facet and get access to the L3 nerve root.  Finally, using the microscope, we were able to retract the dural sac and indeed, there was a large herniated disk going upward and downward.  There was some free fragment going to the foramina.  Removal was done.  We entered the disk space and total gross diskectomy was achieved.  The procedure was done medial and lateral.  At the end we had good decompression with no evidence of any free fragment.  There was no evidence of any CSF leak although there was a little bit of arachnoid pouch.  Because of that, Tisseal was applied to the epidural space.  Valsalva maneuver before and after the application of the Tisseal was negative.  From then on, the area was irrigated closed with Vicryl and nylon.  The patient did well. DD:  12/12/99 TD:  12/13/99 Job: [id number]

## 2010-07-14 NOTE — Discharge Summary (Signed)
[place]. [facility]  Patient:    Stacy Wagner, Stacy Wagner                    MRN: [id number] Adm. Date:  [id number] Disc. Date: [id number] Attending:  Danella Penton                           Discharge Summary  ADMISSION DIAGNOSIS:  Right L2-L3 herniated disk with degenerative disk disease, lumbar.  DISCHARGE DIAGNOSIS:  Right L2-L3 herniated disk with degenerative disk disease, lumbar.  CLINICAL HISTORY:  The patient was seen by me in my office the day before admission complaining of back pain radiating down to the right leg up to the point that she was unable to stand.  MRI showed multiple levels of degenerative disk disease with a large ruptured disk at the L2-L3.  The patient was admitted for surgery.  LABORATORY:  Normal.  HOSPITAL COURSE:  The patient was taken to surgery and underwent right L2-L3 diskectomy with decompression of the L3 nerve root.  After surgery the patient had some back pain and leg pain, but the intensity of the pain was less. Today, she is feeling much better.  She is ready to go home.  CONDITION ON DISCHARGE:  Improvement.  DISCHARGE MEDICATIONS:  Percocet, diazepam, and prednisone.  FOLLOWUP:  Will be seen by me in two weeks.  DISCHARGE INSTRUCTIONS:  Diet:  She was encouraged to lose weight. Activities:  She is not to drive for at least a week, but she was encouraged to start walking up to the point that she should be able to walk at least 2 miles a day in the next three weeks. DD:  12/15/99 TD:  12/16/99 Job: [id number]

## 2010-09-26 ENCOUNTER — Encounter: Payer: Self-pay | Admitting: Family Medicine

## 2010-09-27 ENCOUNTER — Encounter: Payer: Self-pay | Admitting: Family Medicine

## 2010-09-27 ENCOUNTER — Ambulatory Visit (INDEPENDENT_AMBULATORY_CARE_PROVIDER_SITE_OTHER): Payer: BC Managed Care – PPO | Admitting: Family Medicine

## 2010-09-27 VITALS — BP 138/88 | HR 94 | Temp 98.6°F | Resp 16 | Ht 67.5 in | Wt 195.0 lb

## 2010-09-27 DIAGNOSIS — R3915 Urgency of urination: Secondary | ICD-10-CM

## 2010-09-27 DIAGNOSIS — M47812 Spondylosis without myelopathy or radiculopathy, cervical region: Secondary | ICD-10-CM

## 2010-09-27 DIAGNOSIS — I10 Essential (primary) hypertension: Secondary | ICD-10-CM

## 2010-09-27 DIAGNOSIS — M1712 Unilateral primary osteoarthritis, left knee: Secondary | ICD-10-CM

## 2010-09-27 DIAGNOSIS — Z23 Encounter for immunization: Secondary | ICD-10-CM

## 2010-09-27 DIAGNOSIS — E785 Hyperlipidemia, unspecified: Secondary | ICD-10-CM

## 2010-09-27 DIAGNOSIS — D649 Anemia, unspecified: Secondary | ICD-10-CM

## 2010-09-27 DIAGNOSIS — E039 Hypothyroidism, unspecified: Secondary | ICD-10-CM

## 2010-09-27 DIAGNOSIS — B353 Tinea pedis: Secondary | ICD-10-CM

## 2010-09-27 LAB — POCT URINALYSIS DIPSTICK
Protein, UA: NEGATIVE
Spec Grav, UA: 1.015
Urobilinogen, UA: 0.2

## 2010-09-27 LAB — CBC WITH DIFFERENTIAL/PLATELET
Basophils Relative: 0.7 % (ref 0.0–3.0)
Eosinophils Relative: 8 % — ABNORMAL HIGH (ref 0.0–5.0)
HCT: 43.3 % (ref 36.0–46.0)
Hemoglobin: 14.6 g/dL (ref 12.0–15.0)
Lymphs Abs: 1.7 10*3/uL (ref 0.7–4.0)
Monocytes Relative: 8 % (ref 3.0–12.0)
Neutro Abs: 5.9 10*3/uL (ref 1.4–7.7)
Platelets: 320 10*3/uL (ref 150.0–400.0)
RBC: 4.91 Mil/uL (ref 3.87–5.11)
WBC: 9.1 10*3/uL (ref 4.5–10.5)

## 2010-09-27 LAB — BASIC METABOLIC PANEL
GFR: 67.93 mL/min (ref >60.00)
Potassium: 4.5 mEq/L (ref 3.5–5.1)
Sodium: 142 mEq/L (ref 135–145)

## 2010-09-27 LAB — TSH: TSH: 2.39 u[IU]/mL (ref 0.35–5.50)

## 2010-09-27 LAB — LIPID PANEL
Cholesterol: 244 mg/dL — ABNORMAL HIGH (ref 0–200)
Total CHOL/HDL Ratio: 5
VLDL: 22 mg/dL (ref 0.0–40.0)

## 2010-09-27 MED ORDER — IBUPROFEN 800 MG PO TABS: ORAL_TABLET | ORAL | Status: DC

## 2010-09-27 MED ORDER — TRIAMTERENE-HCTZ 75-50 MG PO TABS: 1.0000 | ORAL_TABLET | Freq: Every day | ORAL | Status: DC

## 2010-09-27 MED ORDER — CLOTRIMAZOLE-BETAMETHASONE 1-0.05 % EX CREA: 1.0000 "application " | TOPICAL_CREAM | Freq: Two times a day (BID) | CUTANEOUS | Status: AC

## 2010-09-27 MED ORDER — EST ESTROGENS-METHYLTEST 1.25-2.5 MG PO TABS: 1.0000 | ORAL_TABLET | Freq: Every day | ORAL | Status: DC

## 2010-09-27 NOTE — Progress Notes (Signed)
  Subjective:    Patient ID: Stacy Wagner, female    DOB: 1940-04-27, 70 y.o.   MRN: [id number] This 70 year old white married female is in to discuss her medical problems get necessary lab studies as well as refilling necessary medications there your patient relates that 2 months ago she went to see orthopedic surgeon for pain in the left knee when walking, having a past history of arthritis in this knee  he injected with steroids which did not help then injected the knee with the 3 hyalin injections which did help her at she was started on Osteo Bi-Flex no NSAIDs continues to have tenderness and pain of the knee but not when walking as before. Other complaint is that of pain of a chronic nature that she  has had i as described by Dr. Para Skeans n her neck which x-ray read demonstrated degenerative joint joint disease of the cervical spine also complains of pain into the right shoulder and right upper arm but at other times when raising her right arm she has pain in the shoulder complains of occasional indigestion or reflux at night for which he takes Prilosec which gives her relief. She is very concerned about the fact she is gaining weight weighing 178 and 2010 and now weighing 195 pounds which we will discuss weight reduction She has tenia pedis or athlete feet being treated by Dr. Para Skeans dermatologist Hypertension well controlled 138/88 She relates she occasionally has a rash under her breasts which responds to Lotrisone Has had hysterectomy and and had Pap smear last year not be repeated this year Immunizations up to date except  needs Zostavax  HPI    Review of Systems see history of present illness     Objective:   Physical Exam the patient is a well-built well-nourished white female who is slightly overweight but in no distress HEENT no positive findings carotid pulses are good thyroid is normal Heart no evidence of cardiomegaly heart sounds are good without murmurs per  pulsations good bilaterally Lungs clear to palpation percussion and auscultation no rales no wheezing no dullness Breasts full no masses no tenderness silk clear operative scars present from mammary reduction in the past Abdomen liver spleen kidneys are nonpalpable masses no tenderness  Bowel sounds normal Pelvic exam and rectal exam not done Extremities marked tenderness medial and laterally of left knee other joints normal Spine patient has cervical tendernessCL2 to C7 are sure on the lateral right side of neck produces discomfort in the shoulder and upper arm Skin patient has evident fungal infection of the feet below on ankles Neurological exam negative         Assessment & Plan:  Cervical spine arthritis and arthritis of the knee continue present treatment start ibuprofen 800 mg 3 times a day and continue Osteo Bi-Flex Hypertension controlled continue Hypothyroidism continue Synthroid Chronic labyrinthitis continue meclizine  when necessary Exogenous obesity restart weight reduction diet Tenia pedis continue present treatment

## 2010-09-28 LAB — HEPATIC FUNCTION PANEL
ALT: 32 U/L (ref 0–35)
AST: 37 U/L (ref 0–37)
Albumin: 4.4 g/dL (ref 3.5–5.2)

## 2010-09-29 ENCOUNTER — Telehealth: Payer: Self-pay

## 2010-09-29 NOTE — Telephone Encounter (Signed)
Called to give pt lab results. Left a message for pt to return call.  

## 2010-09-30 ENCOUNTER — Other Ambulatory Visit: Payer: Self-pay | Admitting: Family Medicine

## 2010-10-01 ENCOUNTER — Encounter: Payer: Self-pay | Admitting: Family Medicine

## 2010-10-01 NOTE — Patient Instructions (Signed)
Medical problems improving or improved and stable For the arthritis I recommend he add ibuprofen 800 mg 3 times daily in addition to Osteo Bi-Flex Continue other medications as prescribed

## 2010-10-02 ENCOUNTER — Telehealth: Payer: Self-pay

## 2010-10-02 DIAGNOSIS — R799 Abnormal finding of blood chemistry, unspecified: Secondary | ICD-10-CM

## 2010-10-02 NOTE — Telephone Encounter (Signed)
Pt aware of lab results and has made a lab appt for 3 months.

## 2010-12-11 ENCOUNTER — Encounter (HOSPITAL_BASED_OUTPATIENT_CLINIC_OR_DEPARTMENT_OTHER)
Admission: RE | Admit: 2010-12-11 | Discharge: 2010-12-11 | Disposition: A | Discharge status: Home / Self Care | Payer: Medicare Other | Source: Ambulatory Visit | Attending: Orthopedic Surgery | Admitting: Orthopedic Surgery

## 2010-12-11 LAB — BASIC METABOLIC PANEL
BUN: 14 mg/dL (ref 6–23)
Calcium: 10 mg/dL (ref 8.4–10.5)
Chloride: 101 mEq/L (ref 96–112)
Creatinine, Ser: 0.63 mg/dL (ref 0.50–1.10)
GFR calc Af Amer: >90 mL/min (ref >90)

## 2010-12-14 ENCOUNTER — Ambulatory Visit (HOSPITAL_BASED_OUTPATIENT_CLINIC_OR_DEPARTMENT_OTHER)
Admission: RE | Admit: 2010-12-14 | Discharge: 2010-12-14 | Disposition: A | Discharge status: Home / Self Care | Payer: Medicare Other | Source: Ambulatory Visit | Attending: Orthopedic Surgery | Admitting: Orthopedic Surgery

## 2010-12-14 DIAGNOSIS — E039 Hypothyroidism, unspecified: Secondary | ICD-10-CM | POA: Insufficient documentation

## 2010-12-14 DIAGNOSIS — I10 Essential (primary) hypertension: Secondary | ICD-10-CM | POA: Insufficient documentation

## 2010-12-14 DIAGNOSIS — M65839 Other synovitis and tenosynovitis, unspecified forearm: Secondary | ICD-10-CM | POA: Insufficient documentation

## 2010-12-14 DIAGNOSIS — Z01812 Encounter for preprocedural laboratory examination: Secondary | ICD-10-CM | POA: Insufficient documentation

## 2010-12-14 DIAGNOSIS — K219 Gastro-esophageal reflux disease without esophagitis: Secondary | ICD-10-CM | POA: Insufficient documentation

## 2010-12-14 DIAGNOSIS — Z01810 Encounter for preprocedural cardiovascular examination: Secondary | ICD-10-CM | POA: Insufficient documentation

## 2010-12-28 NOTE — Op Note (Signed)
NAMEGARNET, CHATMON             ACCOUNT NO.:  [id number]  MEDICAL RECORD NO.:  [id number]  LOCATION:                                 FACILITY:  PHYSICIAN:  Katy Fitch. Wilder Kurowski, M.D.      DATE OF BIRTH:  DATE OF PROCEDURE:  12/14/2010 DATE OF DISCHARGE:                              OPERATIVE REPORT   PREOPERATIVE DIAGNOSIS:  Chronic and severe stenosing tenosynovitis, right first dorsal compartment.  POSTOPERATIVE DIAGNOSIS:  Chronic and severe stenosing tenosynovitis, right first dorsal compartment with identification of septum between extensor pollicis brevis and abductor pollicis longus tendons.  OPERATION:  Exploration of right first dorsal compartment with compartment release and excision of septum between abductor pollicis longus and extensor pollicis brevis tendons.  OPERATING SURGEON:  Katy Fitch. Machai Desmith, MD  ASSISTANT:  Marveen Reeks Dasnoit, PA-C  ANESTHESIA:  2% lidocaine field block and sheath block supplemented by IV sedation.  SUPERVISING ANESTHESIOLOGIST:  Zenon Mayo, MD  INDICATIONS:  Jenel Stacy Wagner is a 70 year old woman referred through the courtesy of Dr. Scotty Court for evaluation and management of a painful right first dorsal compartment stenosing tenosynovitis.  She has failed nonoperative measures.  We advised proceeding with release of the first dorsal compartment with the intended outcome, relief of pain and improvement of wrist and thumb mobility.  After informed consent, she is brought to the operating room at this time.  PROCEDURE:  Rheana Casebolt was brought to room 1 of the [facility] and placed in supine position on the operating table.  Following an interview by Dr. Sampson Goon of Anesthesia, sedation and local anesthesia was recommended and accepted.  Under Dr. Jarrett Ables direct supervision, IV sedation was provided in room 1.  After Betadine prep and informed consent, 2% lidocaine was infiltrated in the path intended  incision and into the right first dorsal compartment.  After a few moments, excellent anesthesia was achieved.  The right arm and hand were then prepped with Betadine soap solution and sterilely draped.  Following a routine surgical time-out, the right arm was exsanguinated with an Esmarch bandage and an arterial tourniquet on the proximal right brachium inflated to 250 mmHg.  The procedure commenced with a short transverse incision directly over the apex of the first dorsal compartment.  Subcutaneous tissues were carefully divided taking care to meticulously identify and retract the radial superficial sensory branches.  The compartment was very swollen with a wall thickness of approximately 3 mm.  This was incised longitudinally with a scalpel.  Scissors were used to extend the incision proximally and distally.  The abductor pollicis longus was noted to have two rather robust slips and the extensor pollicis brevis was noted to be in a separate dorsal compartment.  The wall between the compartments was released and the septum excised with a rongeur. Thereafter, free range of motion of the wrist and thumb was recovered. Bleeding points were electrocauterized with bipolar current followed by repair of the skin with intradermal 3-0 Prolene and a Steri-Strip.  There were no apparent complications.  For aftercare, Ms. Navejas is provided a prescription for Ultram 50 mg 1 p.o. q.4-6 hours p.r.n. pain, 20 tablets without refill.  We will see her  back in followup in a week for suture removal.  She may begin immediate range of motion exercises.     Katy Fitch Antwaine Boomhower, M.D.     RVS/MEDQ  D:  12/14/2010  T:  12/14/2010  Job:  [id number]  Electronically Signed by Josephine Igo M.D. on 12/28/2010 05:11:01 PM

## 2011-01-01 ENCOUNTER — Other Ambulatory Visit: Payer: BC Managed Care – PPO

## 2011-01-09 ENCOUNTER — Telehealth: Payer: Self-pay | Admitting: Family Medicine

## 2011-01-09 NOTE — Telephone Encounter (Signed)
DR. Scotty Wagner pt said that  her daughter Stacy Wagner is a pt of yours and pt is requesting for her and her husband be switched to you. Pt is aware that we are no longer excepting any more [facility] pts but had requested for me to ask. Please advise

## 2011-01-15 ENCOUNTER — Other Ambulatory Visit (INDEPENDENT_AMBULATORY_CARE_PROVIDER_SITE_OTHER): Payer: Medicare Other

## 2011-01-15 DIAGNOSIS — R7989 Other specified abnormal findings of blood chemistry: Secondary | ICD-10-CM

## 2011-01-15 DIAGNOSIS — R799 Abnormal finding of blood chemistry, unspecified: Secondary | ICD-10-CM

## 2011-01-15 LAB — HEPATIC FUNCTION PANEL
ALT: 24 U/L (ref 0–35)
AST: 28 U/L (ref 0–37)
Alkaline Phosphatase: 73 U/L (ref 39–117)
Total Bilirubin: 0.7 mg/dL (ref 0.3–1.2)

## 2011-01-15 LAB — LIPID PANEL
HDL: 46.1 mg/dL (ref >39.00)
Total CHOL/HDL Ratio: 4

## 2011-01-16 NOTE — Telephone Encounter (Signed)
Go ahead and accept these patients.

## 2011-03-12 DIAGNOSIS — Z1231 Encounter for screening mammogram for malignant neoplasm of breast: Secondary | ICD-10-CM | POA: Diagnosis not present

## 2011-04-05 ENCOUNTER — Encounter: Payer: Self-pay | Admitting: Family Medicine

## 2011-04-06 ENCOUNTER — Encounter: Payer: Self-pay | Admitting: Family Medicine

## 2011-04-06 ENCOUNTER — Ambulatory Visit (INDEPENDENT_AMBULATORY_CARE_PROVIDER_SITE_OTHER): Payer: Medicare Other | Admitting: Family Medicine

## 2011-04-06 DIAGNOSIS — E039 Hypothyroidism, unspecified: Secondary | ICD-10-CM | POA: Diagnosis not present

## 2011-04-06 DIAGNOSIS — I10 Essential (primary) hypertension: Secondary | ICD-10-CM

## 2011-04-06 DIAGNOSIS — R635 Abnormal weight gain: Secondary | ICD-10-CM

## 2011-04-06 DIAGNOSIS — E785 Hyperlipidemia, unspecified: Secondary | ICD-10-CM | POA: Diagnosis not present

## 2011-04-06 NOTE — Progress Notes (Signed)
  Subjective:    Patient ID: Stacy Wagner, female    DOB: [birth year], 71 y.o.   MRN: [id number]  HPI  Patient seen to establish care. Previous pt of Dr Scotty Court. She has history of hypertension, hyperlipidemia, hypothyroidism, osteoarthritis, vitamin D deficiency, and some recent problems with weight gain. Medications reviewed. Compliant with all. Thyroid normal last June. She takes Maxzide for hypertension. Not monitoring blood pressures at home. Denies any recent headaches or dizziness. No chest pains. Inconsistent exercise.  Patient has previously taken phentermine for weight loss but only briefly for one month. She is not a candidate at this point with her age and elevated blood pressure history  She remains on estrogen and testosterone has taken this for several years. She has been reluctant to taper in the past  Past Medical History  Diagnosis Date  . OA (osteoarthritis)   . Hypertension   . Thyroid disease   . Hypothyroidism   . Hypothyroidism   . Prolapse of female bladder, acquired    Past Surgical History  Procedure Date  . Breast surgery   . Appendectomy   . Abdominal hysterectomy   . Tonsillectomy   . Spine surgery   . Cosmetic surgery     reports that she has never smoked. She has never used smokeless tobacco. She reports that she does not drink alcohol or use illicit drugs. family history includes Cancer in her mother and Hypertension in her sister. Allergies  Allergen Reactions  . Codeine     nausea      Review of Systems  Constitutional: Positive for unexpected weight change. Negative for fever, chills, activity change and appetite change.  Respiratory: Negative for cough and shortness of breath.   Cardiovascular: Negative for chest pain, palpitations and leg swelling.  Gastrointestinal: Negative for abdominal pain.  Genitourinary: Negative for dysuria.  Neurological: Negative for dizziness, syncope, weakness and headaches.  Hematological: Negative  for adenopathy.  Psychiatric/Behavioral: Negative for confusion and dysphoric mood.       Objective:   Physical Exam  Constitutional: She is oriented to person, place, and time. She appears well-developed and well-nourished.  HENT:  Mouth/Throat: Oropharynx is clear and moist.  Cardiovascular: Normal rate and regular rhythm.   Pulmonary/Chest: Effort normal and breath sounds normal. No respiratory distress. She has no wheezes. She has no rales.  Musculoskeletal: She exhibits no edema.  Neurological: She is alert and oriented to person, place, and time.          Assessment & Plan:  #1 hypertension. Elevated reading today. Discussed options including additional medication but she prefers weight loss. Reassess in 4 months. Additional medication then if not to goal #2 recent weight gain. Recent TSH at goal. Discussed strategies for weight loss  #3 hypothyroidism  #4 history of postmenopausal with chronic hormonal placement. Discussed possible tapering will discuss further at physical

## 2011-04-06 NOTE — Patient Instructions (Signed)
Consider home blood pressure cuff. Work on weight loss. Try to reduce sodium intake.

## 2011-04-19 ENCOUNTER — Encounter: Payer: Self-pay | Admitting: Family Medicine

## 2011-04-25 NOTE — Progress Notes (Signed)
Quick Note:  Pt informed on home VM ______ 

## 2011-07-09 DIAGNOSIS — H40019 Open angle with borderline findings, low risk, unspecified eye: Secondary | ICD-10-CM | POA: Diagnosis not present

## 2011-08-12 ENCOUNTER — Ambulatory Visit (INDEPENDENT_AMBULATORY_CARE_PROVIDER_SITE_OTHER): Payer: Medicare Other | Admitting: Family Medicine

## 2011-08-12 VITALS — BP 121/73 | HR 90 | Temp 98.0°F | Resp 18 | Ht 67.5 in | Wt 195.6 lb

## 2011-08-12 DIAGNOSIS — R05 Cough: Secondary | ICD-10-CM | POA: Diagnosis not present

## 2011-08-12 MED ORDER — BENZONATATE 100 MG PO CAPS: 100.0000 mg | ORAL_CAPSULE | Freq: Three times a day (TID) | ORAL | Status: AC | PRN

## 2011-08-12 MED ORDER — CEFDINIR 300 MG PO CAPS: 300.0000 mg | ORAL_CAPSULE | Freq: Two times a day (BID) | ORAL | Status: AC

## 2011-08-12 NOTE — Progress Notes (Signed)
Patient Name: Stacy Wagner Date of Birth: 1940-05-19 Medical Record Number: [id number] Gender: female Date of Encounter: 08/12/2011  History of Present Illness:  Stacy Wagner is a 71 y.o. very pleasant female patient who presents with the following:  She has noted illness for about a week, and is not getting better.  Yesterday had right ear pain, and has a cough that "will not go away."  Cough is non- productive.  ST off and on.  She notes PND and hoarse voice.  No GI symptoms, she has not noted fever or chills.  She has not felt that bad until the last couple of days.  She still works full- time.   She does not take much in the way of chronic medications, and has used only cough drops for this illness.   Patient Active Problem List  Diagnosis  . HYPOTHYROIDISM  . VITAMIN D DEFICIENCY  . HYPERLIPIDEMIA  . ANEMIA  . SYNDROME, CARPAL TUNNEL  . LABYRINTHITIS, CHRONIC  . HYPERTENSION  . OSTEOARTHRITIS  . DEGENERATIVE JOINT DISEASE, CERVICAL SPINE  . NECK PAIN  . INSOMNIA  . EDEMA  . WEIGHT GAIN  . HEADACHE  . FREQUENCY, URINARY  . UNS ADVRS EFF OTH RX MEDICINAL&BIOLOGICAL SBSTNC   Past Medical History  Diagnosis Date  . OA (osteoarthritis)   . Hypertension   . Thyroid disease   . Hypothyroidism   . Hypothyroidism   . Prolapse of female bladder, acquired    Past Surgical History  Procedure Date  . Breast surgery   . Appendectomy   . Abdominal hysterectomy   . Tonsillectomy   . Spine surgery   . Cosmetic surgery    History  Substance Use Topics  . Smoking status: Never Smoker   . Smokeless tobacco: Never Used  . Alcohol Use: No   Family History  Problem Relation Age of Onset  . Cancer Mother   . Hypertension Sister    Allergies  Allergen Reactions  . Codeine     nausea    Medication list has been reviewed and updated.  Prior to Admission medications   Medication Sig Start Date End Date Taking? Authorizing Provider  Ascorbic Acid  (VITAMIN C PO) Take by mouth daily.     Yes Historical Provider, MD  Calcium 600-200 MG-UNIT per tablet Take 1 tablet by mouth daily.     Yes Historical Provider, MD  Calcium Carbonate (CALTRATE 600 PO) Take by mouth daily.     Yes Historical Provider, MD  clotrimazole-betamethasone (LOTRISONE) cream Apply 1 application topically 2 (two) times daily. 09/27/10 09/27/11 Yes Damian Leavell., MD  Cyanocobalamin (VITAMIN B 12 PO) Take by mouth daily.     Yes Historical Provider, MD  econazole nitrate 1 % cream  09/21/10  Yes Historical Provider, MD  estrogen-methylTESTOSTERone (ESTRATEST) 1.25-2.5 MG per tablet Take 1 tablet by mouth daily. 09/27/10  Yes Damian Leavell., MD  ibuprofen (ADVIL,MOTRIN) 800 MG tablet 1 tid pc for bursitis and arthritis 09/27/10  Yes Damian Leavell., MD  multivitamin [facility]) per tablet Take 1 tablet by mouth daily.     Yes Historical Provider, MD  SYNTHROID 50 MCG tablet TAKE 1 TABLET BY MOUTH ONCE A DAY 09/30/10  Yes Damian Leavell., MD  triamterene-hydrochlorothiazide (MAXZIDE) 75-50 MG per tablet Take 1 tablet by mouth daily. 09/27/10 10/27/11 Yes Damian Leavell., MD    Review of Systems:  As per HPI- otherwise negative.  Physical Examination: Filed Vitals:   08/12/11 1043  BP: 121/73  Pulse: 90  Temp: 98 F (36.7 C)  Resp: 18   Filed Vitals:   08/12/11 1043  Height: 5' 7.5" (1.715 m)  Weight: 195 lb 9.6 oz (88.724 kg)   Body mass index is 30.18 kg/(m^2). Ideal Body Weight: Weight in (lb) to have BMI = 25: 161.7   GEN: WDWN, NAD, Non-toxic, A & O x 3, overweight, looks well HEENT: Atraumatic, Normocephalic. Neck supple. No masses, No LAD.  TM and oropharynx wnl,  Nasal cavity normal Ears and Nose: No external deformity. CV: RRR, No M/G/R. No JVD. No thrill. No extra heart sounds. PULM: CTA B, no wheezes, crackles, rhonchi. No retractions. No resp. distress. No accessory muscle use. ABD: S, NT, ND, +BS. No  rebound. No HSM. EXTR: No c/c/e NEURO Normal gait.  PSYCH: Normally interactive. Conversant. Not depressed or anxious appearing.  Calm demeanor.    Assessment and Plan: 1. Cough  benzonatate (TESSALON) 100 MG capsule, cefdinir (OMNICEF) 300 MG capsule   Suspect a viral URI.  Tessalon to use PRN.  If she is not better in a few days she will fill the omnicef- will let me know if still not better or if she is getting worse.    Abbe Amsterdam, MD

## 2011-10-01 ENCOUNTER — Ambulatory Visit (INDEPENDENT_AMBULATORY_CARE_PROVIDER_SITE_OTHER): Payer: Medicare Other | Admitting: Family Medicine

## 2011-10-01 ENCOUNTER — Encounter: Payer: Self-pay | Admitting: Family Medicine

## 2011-10-01 VITALS — BP 150/90 | HR 80 | Temp 98.3°F | Resp 12 | Ht 67.25 in | Wt 196.0 lb

## 2011-10-01 DIAGNOSIS — Z0000 Encounter for general adult medical examination without abnormal findings: Secondary | ICD-10-CM

## 2011-10-01 DIAGNOSIS — E039 Hypothyroidism, unspecified: Secondary | ICD-10-CM

## 2011-10-01 DIAGNOSIS — E785 Hyperlipidemia, unspecified: Secondary | ICD-10-CM

## 2011-10-01 DIAGNOSIS — E559 Vitamin D deficiency, unspecified: Secondary | ICD-10-CM

## 2011-10-01 DIAGNOSIS — I10 Essential (primary) hypertension: Secondary | ICD-10-CM | POA: Diagnosis not present

## 2011-10-01 DIAGNOSIS — M1990 Unspecified osteoarthritis, unspecified site: Secondary | ICD-10-CM

## 2011-10-01 LAB — HEPATIC FUNCTION PANEL
AST: 33 U/L (ref 0–37)
Albumin: 4.1 g/dL (ref 3.5–5.2)
Alkaline Phosphatase: 73 U/L (ref 39–117)
Bilirubin, Direct: 0 mg/dL (ref 0.0–0.3)
Total Bilirubin: 0.4 mg/dL (ref 0.3–1.2)

## 2011-10-01 LAB — TSH: TSH: 2.89 u[IU]/mL (ref 0.35–5.50)

## 2011-10-01 LAB — BASIC METABOLIC PANEL
BUN: 19 mg/dL (ref 6–23)
CO2: 25 mEq/L (ref 19–32)
Calcium: 9.4 mg/dL (ref 8.4–10.5)
Chloride: 103 mEq/L (ref 96–112)
Creatinine, Ser: 0.7 mg/dL (ref 0.4–1.2)
Glucose, Bld: 84 mg/dL (ref 70–99)

## 2011-10-01 LAB — LIPID PANEL
Total CHOL/HDL Ratio: 5
Triglycerides: 129 mg/dL (ref 0.0–149.0)

## 2011-10-01 MED ORDER — CYCLOBENZAPRINE HCL 10 MG PO TABS: 10.0000 mg | ORAL_TABLET | Freq: Three times a day (TID) | ORAL | Status: DC | PRN

## 2011-10-01 NOTE — Progress Notes (Signed)
Subjective:    Patient ID: Stacy Wagner, female    DOB: Mar 01, 1940, 71 y.o.   MRN: [id number]  HPI  Patient here for Medicare wellness exam and medical followup. She has history of hypertension, obesity, hyperlipidemia, osteoarthritis involving multiple joints, vitamin D deficiency, and hypothyroidism. Medications reviewed. Compliant with all. She has not monitored blood pressures. No headaches or dizziness. No chest pains. No dizziness.  She had colonoscopy over 10 years ago. Immunizations up-to-date. She has been battling with weight loss for several years. No consistent exercise.  Past Medical History  Diagnosis Date  . OA (osteoarthritis)   . Hypertension   . Thyroid disease   . Hypothyroidism   . Hypothyroidism   . Prolapse of female bladder, acquired    Past Surgical History  Procedure Date  . Breast surgery   . Appendectomy   . Abdominal hysterectomy   . Tonsillectomy   . Spine surgery   . Cosmetic surgery     reports that she has never smoked. She has never used smokeless tobacco. She reports that she does not drink alcohol or use illicit drugs. family history includes Cancer in her mother and Hypertension in her sister. Allergies  Allergen Reactions  . Codeine     nausea   1.  Risk factors based on Past Medical , Social, and Family history reviewed as above 2.  Limitations in physical activities low risk for fall 3.  Depression/mood no recent depression or anxiety issues 4.  Hearing no major deficits 5.  ADLs independent in all 6.  Cognitive function (orientation to time and place, language, writing, speech,memory) memory intact. Language intact. Judgment intact 7.  Home Safety no issues identified 8.  Height, weight, and visual acuity. Recent 4 pound weight loss due to her efforts. Vision intact. Height intact with no recent changes 9.  Counseling regular exercise and diet and weight loss strategies discussed. 10. Recommendation of preventive services.  Yearly flu vaccine. Repeat colonoscopy 11. Labs based on risk factors TSH, lipid panel, hepatic panel, basic metabolic panel 12. Care Plan as above.    Review of Systems  Constitutional: Negative for fever, activity change, appetite change, fatigue and unexpected weight change.  HENT: Positive for neck pain. Negative for hearing loss, ear pain, sore throat and trouble swallowing.   Eyes: Negative for visual disturbance.  Respiratory: Negative for cough and shortness of breath.   Cardiovascular: Negative for chest pain and palpitations.  Gastrointestinal: Negative for abdominal pain, diarrhea, constipation and blood in stool.  Genitourinary: Negative for dysuria and hematuria.  Musculoskeletal: Positive for arthralgias. Negative for myalgias and back pain.  Skin: Negative for rash.  Neurological: Negative for dizziness, syncope and headaches.  Hematological: Negative for adenopathy.  Psychiatric/Behavioral: Negative for confusion and dysphoric mood.       Objective:   Physical Exam  Constitutional: She is oriented to person, place, and time. She appears well-developed and well-nourished.  HENT:  Head: Normocephalic and atraumatic.  Eyes: EOM are normal. Pupils are equal, round, and reactive to light.  Neck: Normal range of motion. Neck supple. No thyromegaly present.  Cardiovascular: Normal rate, regular rhythm and normal heart sounds.   No murmur heard. Pulmonary/Chest: Breath sounds normal. No respiratory distress. She has no wheezes. She has no rales.  Abdominal: Soft. Bowel sounds are normal. She exhibits no distension and no mass. There is no tenderness. There is no rebound and no guarding.  Musculoskeletal: Normal range of motion. She exhibits no edema.  Lymphadenopathy:  She has no cervical adenopathy.  Neurological: She is alert and oriented to person, place, and time. She displays normal reflexes. No cranial nerve deficit.  Skin: No rash noted.  Psychiatric: She has a  normal mood and affect. Her behavior is normal. Judgment and thought content normal.          Assessment & Plan:  #1 health maintenance. Repeat colonoscopy. Referral given. Discussed importance of exercise in controlling weight. Immunizations up to date. Reminder for yearly flu vaccine, though she has declined in the past. #2 hypothyroidism. Recheck TSH #3 hypertension stable.  Continue current medications #4 hyperlipidemia. Check lipid and hepatic panel #5 history of reported vitamin D deficiency. Patient on replacement. Recheck 25-hydroxy vitamin D level

## 2011-10-01 NOTE — Patient Instructions (Signed)
We will call you regarding repeat colonoscopy Remember yearly flu vaccine Get home blood pressure cuff and monitor regularly. If consistent home readings over 140/90 be in touch

## 2011-10-02 NOTE — Progress Notes (Signed)
Quick Note:  Pt informed on home and cell VM ______ 

## 2011-10-03 ENCOUNTER — Telehealth: Payer: Self-pay | Admitting: Family Medicine

## 2011-10-03 DIAGNOSIS — I10 Essential (primary) hypertension: Secondary | ICD-10-CM

## 2011-10-03 MED ORDER — ECONAZOLE NITRATE 1 % EX CREA: TOPICAL_CREAM | Freq: Every day | CUTANEOUS | Status: DC | PRN

## 2011-10-03 MED ORDER — LEVOTHYROXINE SODIUM 50 MCG PO TABS: 50.0000 ug | ORAL_TABLET | Freq: Every day | ORAL | Status: DC

## 2011-10-03 MED ORDER — EST ESTROGENS-METHYLTEST 1.25-2.5 MG PO TABS: 1.0000 | ORAL_TABLET | Freq: Every day | ORAL | Status: DC

## 2011-10-03 MED ORDER — TRIAMTERENE-HCTZ 75-50 MG PO TABS: 1.0000 | ORAL_TABLET | Freq: Every day | ORAL | Status: DC

## 2011-10-03 NOTE — Telephone Encounter (Signed)
Pt was here for cpx on 10/01/11. States that usually after her cpx, Dr. Caryl Never sends in refills on all her meds. The only one sent in on 8/5 was the Flexeril. Patient requesting refills be sent to [facility] on [address] on:  econazole nitrate 1 % cream estrogen-methylTESTOSTERone (ESTRATEST) 1.25-2.5 MG per tablet SYNTHROID 50 MCG tablet triamterene-hydrochlorothiazide (MAXZIDE) 75-50 MG per tablet  Thank you.

## 2011-10-04 ENCOUNTER — Telehealth: Payer: Self-pay | Admitting: Family Medicine

## 2011-10-04 NOTE — Telephone Encounter (Signed)
Spoke with justin at [facility] - they have Rf on hold .  Pt aware

## 2011-10-04 NOTE — Telephone Encounter (Signed)
Patient called stating that upon picking up her rx for estratest it only had one refill on it and she fills it should have been a 90 day supply. Please assist.

## 2011-10-11 ENCOUNTER — Encounter: Payer: Self-pay | Admitting: Internal Medicine

## 2011-11-13 ENCOUNTER — Ambulatory Visit (AMBULATORY_SURGERY_CENTER): Payer: Medicare Other

## 2011-11-13 VITALS — Ht 67.5 in | Wt 197.8 lb

## 2011-11-13 DIAGNOSIS — Z860100 Personal history of colon polyps, unspecified: Secondary | ICD-10-CM

## 2011-11-13 DIAGNOSIS — Z1211 Encounter for screening for malignant neoplasm of colon: Secondary | ICD-10-CM

## 2011-11-13 MED ORDER — NA SULFATE-K SULFATE-MG SULF 17.5-3.13-1.6 GM/177ML PO SOLN: 1.0000 | Freq: Once | ORAL | Status: DC

## 2011-11-13 NOTE — Progress Notes (Signed)
Pt came into office today for her PV prior to her colonoscopy with Dr Rhea Belton on 11/27/11. Informed pt she had seen Dr Arlyce Dice 12 years ago for a colonoscopy and we would need to reschedule her with Dr Arlyce Dice since that was her previous GI doctor.Pt stated she could not change her appt due to making arrangements at work and our office was the one that scheduled the appt.I will discuss the matter with the supervisor on the 4 th floor.

## 2011-11-27 ENCOUNTER — Ambulatory Visit (AMBULATORY_SURGERY_CENTER): Payer: Medicare Other | Admitting: Internal Medicine

## 2011-11-27 ENCOUNTER — Encounter: Payer: Self-pay | Admitting: Internal Medicine

## 2011-11-27 VITALS — BP 146/84 | HR 89 | Temp 97.9°F | Resp 14 | Ht 67.5 in | Wt 197.0 lb

## 2011-11-27 DIAGNOSIS — E669 Obesity, unspecified: Secondary | ICD-10-CM | POA: Diagnosis not present

## 2011-11-27 DIAGNOSIS — Z86010 Personal history of colonic polyps: Secondary | ICD-10-CM | POA: Diagnosis not present

## 2011-11-27 DIAGNOSIS — E785 Hyperlipidemia, unspecified: Secondary | ICD-10-CM | POA: Diagnosis not present

## 2011-11-27 DIAGNOSIS — E039 Hypothyroidism, unspecified: Secondary | ICD-10-CM | POA: Diagnosis not present

## 2011-11-27 DIAGNOSIS — D126 Benign neoplasm of colon, unspecified: Secondary | ICD-10-CM

## 2011-11-27 DIAGNOSIS — Z1211 Encounter for screening for malignant neoplasm of colon: Secondary | ICD-10-CM | POA: Diagnosis not present

## 2011-11-27 DIAGNOSIS — I10 Essential (primary) hypertension: Secondary | ICD-10-CM | POA: Diagnosis not present

## 2011-11-27 MED ORDER — SODIUM CHLORIDE 0.9 % IV SOLN: 500.0000 mL | INTRAVENOUS | Status: DC

## 2011-11-27 NOTE — Progress Notes (Signed)
The pt tolerated the colonoscopy very well. Maw   

## 2011-11-27 NOTE — Op Note (Signed)
[facility] [address] [place], [address]   COLONOSCOPY PROCEDURE REPORT  PATIENT: Stacy, Wagner  MR#: [id number] BIRTHDATE: 1940/07/13 , 71  yrs. old GENDER: Female ENDOSCOPIST: Beverley Fiedler, MD REFERRED AO:[id number], Bruce PROCEDURE DATE:  11/27/2011 PROCEDURE:   Colonoscopy with snare polypectomy ASA CLASS:   Class III INDICATIONS:average risk screening and last colonoscopy performed 2001. MEDICATIONS: MAC sedation, administered by CRNA and propofol (Diprivan) 250mg  IV  DESCRIPTION OF PROCEDURE:   After the risks benefits and alternatives of the procedure were thoroughly explained, informed consent was obtained.  A digital rectal exam revealed no rectal mass.   The LB CF-[id number]  endoscope was introduced through the anus and advanced to the cecum, which was identified by both the appendix and ileocecal valve. No adverse events experienced. The quality of the prep was Suprep good  The instrument was then slowly withdrawn as the colon was fully examined.  COLON FINDINGS: Three sessile polyps ranging between 3-66mm in size were found at the cecum, in the ascending colon, and transverse colon.  Polypectomy was performed using cold snare.  All resections were complete and all polyp tissue was completely retrieved. Small internal hemorrhoids were found.  Retroflexed views revealed internal hemorrhoids. The time to cecum=5 minutes 04 seconds. Withdrawal time=13 minutes 50 seconds.  The scope was withdrawn and the procedure completed. COMPLICATIONS: There were no complications.  ENDOSCOPIC IMPRESSION: 1.   Three sessile polyps ranging between 3-39mm in size were found at the cecum, in the ascending colon, and transverse colon; Polypectomy was performed using cold snare 2.   Small internal hemorrhoids  RECOMMENDATIONS: 1.  Hold aspirin, aspirin products, and anti-inflammatory medication for 1 week. 2.  Await pathology results 3.  If the polyps  removed today are proven to be adenomatous (pre-cancerous) polyps, you will need a colonoscopy in 3 years. Otherwise you should continue to follow colorectal cancer screening guidelines for "routine risk" patients with a colonoscopy in 10 years.  You will receive a letter within 1-2 weeks with the results of your biopsy as well as final recommendations.  Please call my office if you have not received a letter after 3 weeks.  eSigned:  Beverley Fiedler, MD 11/27/2011 9:11 AM   cc: Evelena Peat, MD and The Patient

## 2011-11-27 NOTE — Progress Notes (Signed)
Patient did not experience any of the following events: a burn prior to discharge; a fall within the facility; wrong site/side/patient/procedure/implant event; or a hospital transfer or hospital admission upon discharge from the facility. (G8907) Patient did not have preoperative order for IV antibiotic SSI prophylaxis. (G8918)  

## 2011-11-27 NOTE — Patient Instructions (Addendum)
Discharge instructions given with verbal understanding. Handouts on polyps and hemorrhoids given. Resume previous medications. Hold aspirin and aspirin products for one week. YOU HAD AN ENDOSCOPIC PROCEDURE TODAY AT THE [facility]: Refer to the procedure report that was given to you for any specific questions about what was found during the examination.  If the procedure report does not answer your questions, please call your gastroenterologist to clarify.  If you requested that your care partner not be given the details of your procedure findings, then the procedure report has been included in a sealed envelope for you to review at your convenience later.  YOU SHOULD EXPECT: Some feelings of bloating in the abdomen. Passage of more gas than usual.  Walking can help get rid of the air that was put into your GI tract during the procedure and reduce the bloating. If you had a lower endoscopy (such as a colonoscopy or flexible sigmoidoscopy) you may notice spotting of blood in your stool or on the toilet paper. If you underwent a bowel prep for your procedure, then you may not have a normal bowel movement for a few days.  DIET: Your first meal following the procedure should be a light meal and then it is ok to progress to your normal diet.  A half-sandwich or bowl of soup is an example of a good first meal.  Heavy or fried foods are harder to digest and may make you feel nauseous or bloated.  Likewise meals heavy in dairy and vegetables can cause extra gas to form and this can also increase the bloating.  Drink plenty of fluids but you should avoid alcoholic beverages for 24 hours.  ACTIVITY: Your care partner should take you home directly after the procedure.  You should plan to take it easy, moving slowly for the rest of the day.  You can resume normal activity the day after the procedure however you should NOT DRIVE or use heavy machinery for 24 hours (because of the sedation medicines used  during the test).    SYMPTOMS TO REPORT IMMEDIATELY: A gastroenterologist can be reached at any hour.  During normal business hours, 8:30 AM to 5:00 PM Monday through Friday, call [phone].  After hours and on weekends, please call the GI answering service at [phone] who will take a message and have the physician on call contact you.   Following lower endoscopy (colonoscopy or flexible sigmoidoscopy):  Excessive amounts of blood in the stool  Significant tenderness or worsening of abdominal pains  Swelling of the abdomen that is new, acute  Fever of 100F or higher FOLLOW UP: If any biopsies were taken you will be contacted by phone or by letter within the next 1-3 weeks.  Call your gastroenterologist if you have not heard about the biopsies in 3 weeks.  Our staff will call the home number listed on your records the next business day following your procedure to check on you and address any questions or concerns that you may have at that time regarding the information given to you following your procedure. This is a courtesy call and so if there is no answer at the home number and we have not heard from you through the emergency physician on call, we will assume that you have returned to your regular daily activities without incident.  SIGNATURES/CONFIDENTIALITY: You and/or your care partner have signed paperwork which will be entered into your electronic medical record.  These signatures attest to the fact that that  the information above on your After Visit Summary has been reviewed and is understood.  Full responsibility of the confidentiality of this discharge information lies with you and/or your care-partner.

## 2011-11-28 ENCOUNTER — Telehealth: Payer: Self-pay | Admitting: *Deleted

## 2011-11-28 NOTE — Telephone Encounter (Signed)
  Follow up Call-  Call back number 11/27/2011  Post procedure Call Back phone  # [phone]  Permission to leave phone message Yes     Patient questions:  Do you have a fever, pain , or abdominal swelling? no Pain Score  0 *  Have you tolerated food without any problems? yes  Have you been able to return to your normal activities? yes  Do you have any questions about your discharge instructions: Diet   no Medications  no Follow up visit  no  Do you have questions or concerns about your Care? no  Actions: * If pain score is 4 or above: No action needed, pain <4.

## 2011-12-03 ENCOUNTER — Encounter: Payer: Self-pay | Admitting: Internal Medicine

## 2012-01-07 DIAGNOSIS — H2510 Age-related nuclear cataract, unspecified eye: Secondary | ICD-10-CM | POA: Diagnosis not present

## 2012-02-21 DIAGNOSIS — L821 Other seborrheic keratosis: Secondary | ICD-10-CM | POA: Diagnosis not present

## 2012-02-21 DIAGNOSIS — D485 Neoplasm of uncertain behavior of skin: Secondary | ICD-10-CM | POA: Diagnosis not present

## 2012-02-21 DIAGNOSIS — L259 Unspecified contact dermatitis, unspecified cause: Secondary | ICD-10-CM | POA: Diagnosis not present

## 2012-05-05 ENCOUNTER — Other Ambulatory Visit: Payer: Self-pay | Admitting: Family Medicine

## 2012-05-09 ENCOUNTER — Other Ambulatory Visit: Payer: Self-pay | Admitting: Family Medicine

## 2012-07-17 DIAGNOSIS — H2510 Age-related nuclear cataract, unspecified eye: Secondary | ICD-10-CM | POA: Diagnosis not present

## 2012-07-17 DIAGNOSIS — H40019 Open angle with borderline findings, low risk, unspecified eye: Secondary | ICD-10-CM | POA: Diagnosis not present

## 2012-09-25 DIAGNOSIS — H40019 Open angle with borderline findings, low risk, unspecified eye: Secondary | ICD-10-CM | POA: Diagnosis not present

## 2012-10-01 ENCOUNTER — Encounter: Payer: Self-pay | Admitting: Family Medicine

## 2012-10-01 ENCOUNTER — Ambulatory Visit (INDEPENDENT_AMBULATORY_CARE_PROVIDER_SITE_OTHER): Payer: Medicare Other | Admitting: Family Medicine

## 2012-10-01 VITALS — BP 138/80 | HR 81 | Temp 97.7°F | Wt 217.0 lb

## 2012-10-01 DIAGNOSIS — I10 Essential (primary) hypertension: Secondary | ICD-10-CM | POA: Diagnosis not present

## 2012-10-01 DIAGNOSIS — E669 Obesity, unspecified: Secondary | ICD-10-CM | POA: Insufficient documentation

## 2012-10-01 DIAGNOSIS — E039 Hypothyroidism, unspecified: Secondary | ICD-10-CM | POA: Diagnosis not present

## 2012-10-01 DIAGNOSIS — E785 Hyperlipidemia, unspecified: Secondary | ICD-10-CM | POA: Diagnosis not present

## 2012-10-01 DIAGNOSIS — Z0000 Encounter for general adult medical examination without abnormal findings: Secondary | ICD-10-CM

## 2012-10-01 LAB — LIPID PANEL
HDL: 36.9 mg/dL — ABNORMAL LOW (ref >39.00)
Total CHOL/HDL Ratio: 6
VLDL: 27.2 mg/dL (ref 0.0–40.0)

## 2012-10-01 LAB — HEPATIC FUNCTION PANEL
ALT: 25 U/L (ref 0–35)
Total Bilirubin: 0.4 mg/dL (ref 0.3–1.2)
Total Protein: 7.6 g/dL (ref 6.0–8.3)

## 2012-10-01 LAB — BASIC METABOLIC PANEL
Chloride: 103 mEq/L (ref 96–112)
GFR: 76.48 mL/min (ref >60.00)
Glucose, Bld: 90 mg/dL (ref 70–99)
Potassium: 4.1 mEq/L (ref 3.5–5.1)
Sodium: 138 mEq/L (ref 135–145)

## 2012-10-01 MED ORDER — TRIAMTERENE-HCTZ 75-50 MG PO TABS: 1.0000 | ORAL_TABLET | Freq: Every day | ORAL | Status: DC

## 2012-10-01 MED ORDER — LEVOTHYROXINE SODIUM 50 MCG PO TABS: 50.0000 ug | ORAL_TABLET | Freq: Every day | ORAL | Status: DC

## 2012-10-01 MED ORDER — CLOTRIMAZOLE-BETAMETHASONE 1-0.05 % EX CREA: TOPICAL_CREAM | Freq: Two times a day (BID) | CUTANEOUS | Status: DC

## 2012-10-01 MED ORDER — EST ESTROGENS-METHYLTEST 1.25-2.5 MG PO TABS: 1.0000 | ORAL_TABLET | Freq: Every day | ORAL | Status: DC

## 2012-10-01 NOTE — Patient Instructions (Addendum)
Set up repeat mammogram Continue yearly flu vaccine. Try to lose some weight.

## 2012-10-01 NOTE — Progress Notes (Signed)
Subjective:    Patient ID: Stacy Wagner, female    DOB: November 19, 1940, 72 y.o.   MRN: [id number]  HPI Patient seen for Medicare wellness exam and medical followup Medical history significant for hypertension, hyperlipidemia, obesity, insomnia, GERD, osteoarthritis, hypothyroidism, and history of vitamin D deficiency GERD well controlled with Prilosec. Hypertension treated with triamterene hydrochlorothiazide. Blood pressure well-controlled. No dizziness. No headaches. No recent chest pains.  She had some recent weight gain which she attributes to poor dietary compliance. She is compliant with thyroid medication. No cold intolerance. No major constipation issues.  Immunizations up to date. Colonoscopy last year. Needs repeat mammogram. Previous hysterectomy for benign disease.  Past Medical History  Diagnosis Date  . OA (osteoarthritis)   . Hypertension   . Thyroid disease   . Hypothyroidism   . Hypothyroidism   . Prolapse of female bladder, acquired   . GERD (gastroesophageal reflux disease)   . Hearing loss     bil / no hearing aids  . Bursitis disorder     bil shoulders  . AC (acromioclavicular) joint bone spurs    Past Surgical History  Procedure Laterality Date  . Breast surgery    . Appendectomy    . Tonsillectomy    . Spine surgery    . Cosmetic surgery      tummy tuck /remove excess skin arms and legs  . Carpat tunnel  release      right hand  . Hand tendon surgery      right hand  . Abdominal hysterectomy      uterine prolapse    reports that she has never smoked. She has never used smokeless tobacco. She reports that she does not drink alcohol or use illicit drugs. family history includes Cancer in her mother and Hypertension in her sister. Allergies  Allergen Reactions  . Codeine     nausea   1.  Risk factors based on Past Medical , Social, and Family history reviewed and as above 2.  Limitations in physical activities no recent falls and low risk 3.   Depression/mood no depression or anxiety issues 4.  Hearing chronic hearing loss. She is previously seen osteolysis. She's not willing to get hearing aids at this time 5.  ADLs independent in all 6.  Cognitive function (orientation to time and place, language, writing, speech,memory) memory intact. Judgment and language intact 7.  Home Safety no issues 8.  Height, weight, and visual acuity. Recent weight gain as above due to poor dietary compliance. Height stable. Vision symptoms stable 9.  Counseling discussed weight loss strategies. 10. Recommendation of preventive services. Continue yearly flu vaccine. She needs repeat mammogram 11. Labs based on risk factors lipid, hepatic, basic metabolic panel, TSH. 12. Care Plan as above    Review of Systems  Constitutional: Negative for fever, activity change, appetite change, fatigue and unexpected weight change.  HENT: Positive for hearing loss (chronic with no recent sudden changes). Negative for ear pain, sore throat and trouble swallowing.   Eyes: Negative for visual disturbance.  Respiratory: Negative for cough and shortness of breath.   Cardiovascular: Negative for chest pain and palpitations.  Gastrointestinal: Negative for abdominal pain, diarrhea, constipation and blood in stool.  Genitourinary: Negative for dysuria and hematuria.  Musculoskeletal: Negative for myalgias, back pain and arthralgias.  Skin: Negative for rash.  Neurological: Negative for dizziness, syncope and headaches.  Hematological: Negative for adenopathy.  Psychiatric/Behavioral: Negative for confusion and dysphoric mood.       Objective:  Physical Exam  Constitutional: She is oriented to person, place, and time. She appears well-developed and well-nourished.  HENT:  Head: Normocephalic and atraumatic.  Eyes: EOM are normal. Pupils are equal, round, and reactive to light.  Neck: Normal range of motion. Neck supple. No thyromegaly present.  Cardiovascular:  Normal rate, regular rhythm and normal heart sounds.   No murmur heard. Pulmonary/Chest: Breath sounds normal. No respiratory distress. She has no wheezes. She has no rales.  Abdominal: Soft. Bowel sounds are normal. She exhibits no distension and no mass. There is no tenderness. There is no rebound and no guarding.  Musculoskeletal: Normal range of motion. She exhibits no edema.  Lymphadenopathy:    She has no cervical adenopathy.  Neurological: She is alert and oriented to person, place, and time. She displays normal reflexes. No cranial nerve deficit.  Skin: No rash noted.  Psychiatric: She has a normal mood and affect. Her behavior is normal. Judgment and thought content normal.          Assessment & Plan:  #1 health maintenance. Continue yearly flu vaccine. Set up mammogram. Work on weight loss #2 hypertension. Stable. Refill Maxzide for one year #3 hypothyroidism. Recheck TSH. Refill levothyroxin for one year #4 post menopause. She is reluctant to discontinue estrogen therapy. Refill given. Set up mammogram as above.

## 2013-01-01 ENCOUNTER — Other Ambulatory Visit: Payer: Self-pay

## 2013-04-02 DIAGNOSIS — H40019 Open angle with borderline findings, low risk, unspecified eye: Secondary | ICD-10-CM | POA: Diagnosis not present

## 2013-04-02 DIAGNOSIS — H2510 Age-related nuclear cataract, unspecified eye: Secondary | ICD-10-CM | POA: Diagnosis not present

## 2013-04-20 ENCOUNTER — Other Ambulatory Visit: Payer: Self-pay | Admitting: Family Medicine

## 2013-04-21 NOTE — Telephone Encounter (Signed)
Refill OK for one year.

## 2013-09-21 DIAGNOSIS — Z1231 Encounter for screening mammogram for malignant neoplasm of breast: Secondary | ICD-10-CM | POA: Diagnosis not present

## 2013-10-06 ENCOUNTER — Encounter: Payer: Self-pay | Admitting: Family Medicine

## 2013-10-08 ENCOUNTER — Ambulatory Visit (INDEPENDENT_AMBULATORY_CARE_PROVIDER_SITE_OTHER): Payer: Medicare Other | Admitting: Family Medicine

## 2013-10-08 ENCOUNTER — Encounter: Payer: Self-pay | Admitting: Family Medicine

## 2013-10-08 VITALS — BP 130/80 | HR 80 | Temp 97.8°F | Ht 67.5 in | Wt 175.0 lb

## 2013-10-08 DIAGNOSIS — Z0000 Encounter for general adult medical examination without abnormal findings: Secondary | ICD-10-CM | POA: Diagnosis not present

## 2013-10-08 DIAGNOSIS — K219 Gastro-esophageal reflux disease without esophagitis: Secondary | ICD-10-CM | POA: Insufficient documentation

## 2013-10-08 DIAGNOSIS — E785 Hyperlipidemia, unspecified: Secondary | ICD-10-CM

## 2013-10-08 DIAGNOSIS — M1990 Unspecified osteoarthritis, unspecified site: Secondary | ICD-10-CM | POA: Diagnosis not present

## 2013-10-08 DIAGNOSIS — I10 Essential (primary) hypertension: Secondary | ICD-10-CM

## 2013-10-08 DIAGNOSIS — Z23 Encounter for immunization: Secondary | ICD-10-CM | POA: Diagnosis not present

## 2013-10-08 DIAGNOSIS — E039 Hypothyroidism, unspecified: Secondary | ICD-10-CM

## 2013-10-08 LAB — HEPATIC FUNCTION PANEL
ALBUMIN: 4.1 g/dL (ref 3.5–5.2)
ALT: 25 U/L (ref 0–35)
AST: 28 U/L (ref 0–37)
Alkaline Phosphatase: 80 U/L (ref 39–117)
Bilirubin, Direct: 0.1 mg/dL (ref 0.0–0.3)
TOTAL PROTEIN: 7.7 g/dL (ref 6.0–8.3)
Total Bilirubin: 0.7 mg/dL (ref 0.2–1.2)

## 2013-10-08 LAB — BASIC METABOLIC PANEL
BUN: 15 mg/dL (ref 6–23)
CHLORIDE: 99 meq/L (ref 96–112)
CO2: 29 meq/L (ref 19–32)
CREATININE: 0.8 mg/dL (ref 0.4–1.2)
Calcium: 9.3 mg/dL (ref 8.4–10.5)
GFR: 77.39 mL/min (ref >60.00)
Glucose, Bld: 60 mg/dL — ABNORMAL LOW (ref 70–99)
POTASSIUM: 4.1 meq/L (ref 3.5–5.1)
Sodium: 138 mEq/L (ref 135–145)

## 2013-10-08 LAB — TSH: TSH: 2.4 u[IU]/mL (ref 0.35–4.50)

## 2013-10-08 LAB — LIPID PANEL
CHOL/HDL RATIO: 5
CHOLESTEROL: 199 mg/dL (ref 0–200)
HDL: 41.3 mg/dL (ref >39.00)
LDL CALC: 142 mg/dL — AB (ref 0–99)
NonHDL: 157.7
TRIGLYCERIDES: 79 mg/dL (ref 0.0–149.0)
VLDL: 15.8 mg/dL (ref 0.0–40.0)

## 2013-10-08 MED ORDER — LEVOTHYROXINE SODIUM 50 MCG PO TABS: 50.0000 ug | ORAL_TABLET | Freq: Every day | ORAL | Status: DC

## 2013-10-08 MED ORDER — TRIAMTERENE-HCTZ 75-50 MG PO TABS: 1.0000 | ORAL_TABLET | Freq: Every day | ORAL | Status: DC

## 2013-10-08 MED ORDER — EST ESTROGENS-METHYLTEST 1.25-2.5 MG PO TABS: ORAL_TABLET | ORAL | Status: DC

## 2013-10-08 MED ORDER — CYCLOBENZAPRINE HCL 10 MG PO TABS: 10.0000 mg | ORAL_TABLET | Freq: Three times a day (TID) | ORAL | Status: DC | PRN

## 2013-10-08 NOTE — Progress Notes (Signed)
Pre visit review using our clinic review tool, if applicable. No additional management support is needed unless otherwise documented below in the visit note. 

## 2013-10-08 NOTE — Progress Notes (Signed)
Subjective:    Patient ID: Stacy Wagner, female    DOB: 10/18/1940, 73 y.o.   MRN: [id number]  HPI Patient is here for Medicare wellness exam and medical followup. Her chronic problems include history of hypothyroidism, hyperlipidemia, hypertension, and obesity. She has done tremendous job of weight loss and lost about 30 pounds since last year. She's done this mostly due to dietary changes. She had colonoscopy in 2013 with recommended three-year followup. Needs Prevnar 13. Other immunizations up to date. She had recent mammogram. She's had previous hysterectomy.  Medications reviewed. She remains on triamterene hydrochlorothiazide for hypertension. Blood pressure well controlled. She is on Synthroid for hypothyroidism. No recent fatigue issues. She has history of GERD which is been controlled with omeprazole. She's tried several times tapering off this but without success. Denies any dysphagia.  Past Medical History  Diagnosis Date  . OA (osteoarthritis)   . Hypertension   . Thyroid disease   . Hypothyroidism   . Hypothyroidism   . Prolapse of female bladder, acquired   . GERD (gastroesophageal reflux disease)   . Hearing loss     bil / no hearing aids  . Bursitis disorder     bil shoulders  . AC (acromioclavicular) joint bone spurs    Past Surgical History  Procedure Laterality Date  . Breast surgery    . Appendectomy    . Tonsillectomy    . Spine surgery    . Cosmetic surgery      tummy tuck /remove excess skin arms and legs  . Carpat tunnel  release      right hand  . Hand tendon surgery      right hand  . Abdominal hysterectomy      uterine prolapse    reports that she has never smoked. She has never used smokeless tobacco. She reports that she does not drink alcohol or use illicit drugs. family history includes Cancer in her mother; Hypertension in her sister. Allergies  Allergen Reactions  . Codeine     nausea   1.  Risk factors based on Past Medical ,  Social, and Family history reviewed and as indicated above with no changes 2.  Limitations in physical activities None.  No recent falls. 3.  Depression/mood No active depression or anxiety issues 4.  Hearing  Chronic bilateral hearing loss and we've recommended audiology assessment 5.  ADLs independent in all. 6.  Cognitive function (orientation to time and place, language, writing, speech,memory) no short or long term memory issues.  Language and judgement intact. 7.  Home Safety no issues 8.  Height, weight, and visual acuity.all stable. Recent weight loss as above due to her efforts 9.  Counseling discussed discussed regular weightbearing exercise and adequate calcium and vitamin D consumption. Recommend hearing assessment 10. Recommendation of preventive services. Prevnar 13. Recommend yearly flu vaccine, though she declines. Repeat colonoscopy by next year 40. Labs based on risk factors lipid, hepatic, basic metabolic panel, and TSH 12. Care Plan yearly flu vaccine recommended but declined by patient. Repeat colonoscopy by next year. Repeat tetanus in 5 years. 13. Other Providers-Dr Pyrtle (GI) 14. Written schedule of screening/prevention services given to patient.    Review of Systems  Constitutional: Negative for fever, activity change, appetite change, fatigue and unexpected weight change.  HENT: Negative for ear pain, hearing loss, sore throat and trouble swallowing.   Eyes: Negative for visual disturbance.  Respiratory: Negative for cough and shortness of breath.   Cardiovascular: Negative for  chest pain and palpitations.  Gastrointestinal: Negative for abdominal pain, diarrhea, constipation and blood in stool.  Genitourinary: Negative for dysuria and hematuria.  Musculoskeletal: Negative for arthralgias, back pain and myalgias.  Skin: Negative for rash.  Neurological: Negative for dizziness, syncope and headaches.  Hematological: Negative for adenopathy.    Psychiatric/Behavioral: Negative for confusion and dysphoric mood.       Objective:   Physical Exam  Constitutional: She is oriented to person, place, and time. She appears well-developed and well-nourished.  HENT:  Head: Normocephalic and atraumatic.  Eyes: EOM are normal. Pupils are equal, round, and reactive to light.  Neck: Normal range of motion. Neck supple. No thyromegaly present.  Cardiovascular: Normal rate, regular rhythm and normal heart sounds.   No murmur heard. Pulmonary/Chest: Breath sounds normal. No respiratory distress. She has no wheezes. She has no rales.  Abdominal: Soft. Bowel sounds are normal. She exhibits no distension and no mass. There is no tenderness. There is no rebound and no guarding.  Genitourinary:  Breasts are symmetric with no mass. Pelvic deferred as she's had previous hysterectomy for benign disease-uterine prolapse  Musculoskeletal: Normal range of motion. She exhibits no edema.  Lymphadenopathy:    She has no cervical adenopathy.  Neurological: She is alert and oriented to person, place, and time. She displays normal reflexes. No cranial nerve deficit.  Skin: No rash noted.  Psychiatric: She has a normal mood and affect. Her behavior is normal. Judgment and thought content normal.          Assessment & Plan:  #1 health maintenance. She'll be due for repeat colonoscopy next year. She is encouraged to get hearing assessed. Check lab work including TSH, basic metabolic panel, lipid panel. Prevnar 13 given. Encouraged to get yearly flu vaccine but she declines #2 hypothyroidism. Recheck TSH #3 hypertension which is well-controlled. Continue Maxzide #4 dyslipidemia. Patient requesting repeat lipid panel #5 GERD. Continue omeprazole

## 2013-10-08 NOTE — Patient Instructions (Signed)
Let us know if you change your mind regarding flu vaccine this fall You'll need repeat colonoscopy by next year. Recommend regular weightbearing exercise such as walking Recommend daily calcium 1200 mg daily and vitamin D [phone] international units daily We will call you with lab work. Repeat tetanus in 5 years Continue yearly mammogram

## 2013-10-09 ENCOUNTER — Telehealth: Payer: Self-pay | Admitting: Family Medicine

## 2013-10-09 NOTE — Telephone Encounter (Signed)
Relevant patient education assigned to patient using Emmi. ° °

## 2013-10-22 DIAGNOSIS — D3709 Neoplasm of uncertain behavior of other specified sites of the oral cavity: Secondary | ICD-10-CM | POA: Diagnosis not present

## 2013-10-22 DIAGNOSIS — D3701 Neoplasm of uncertain behavior of lip: Secondary | ICD-10-CM | POA: Diagnosis not present

## 2013-10-29 DIAGNOSIS — K1329 Other disturbances of oral epithelium, including tongue: Secondary | ICD-10-CM | POA: Diagnosis not present

## 2013-11-05 DIAGNOSIS — K1329 Other disturbances of oral epithelium, including tongue: Secondary | ICD-10-CM | POA: Diagnosis not present

## 2014-03-04 DIAGNOSIS — H524 Presbyopia: Secondary | ICD-10-CM | POA: Diagnosis not present

## 2014-03-04 DIAGNOSIS — H40013 Open angle with borderline findings, low risk, bilateral: Secondary | ICD-10-CM | POA: Diagnosis not present

## 2014-03-04 DIAGNOSIS — H2513 Age-related nuclear cataract, bilateral: Secondary | ICD-10-CM | POA: Diagnosis not present

## 2014-03-29 ENCOUNTER — Ambulatory Visit (INDEPENDENT_AMBULATORY_CARE_PROVIDER_SITE_OTHER): Payer: Medicare Other | Admitting: Family Medicine

## 2014-03-29 ENCOUNTER — Encounter: Payer: Self-pay | Admitting: Family Medicine

## 2014-03-29 VITALS — BP 130/90 | HR 90 | Temp 97.9°F | Wt 178.0 lb

## 2014-03-29 DIAGNOSIS — H938X3 Other specified disorders of ear, bilateral: Secondary | ICD-10-CM | POA: Diagnosis not present

## 2014-03-29 NOTE — Progress Notes (Signed)
Pre visit review using our clinic review tool, if applicable. No additional management support is needed unless otherwise documented below in the visit note. 

## 2014-03-29 NOTE — Patient Instructions (Signed)

## 2014-03-29 NOTE — Progress Notes (Signed)
   Subjective:    Patient ID: Stacy Wagner, female    DOB: Apr 12, 1940, 74 y.o.   MRN: [id number]  HPI Acute visit. Patient here with 2 week history of sensation of both ears being "clogged up". She has chronic bilateral hearing loss which is unchanged. She has not had any ear pain. No ear drainage. No sinus congestion. She has occasional fleeting vertigo but this is not new. Denies any recent sinusitis symptoms. No alleviating factors. No exacerbating factors. No recent flying  Past Medical History  Diagnosis Date  . OA (osteoarthritis)   . Hypertension   . Thyroid disease   . Hypothyroidism   . Hypothyroidism   . Prolapse of female bladder, acquired   . GERD (gastroesophageal reflux disease)   . Hearing loss     bil / no hearing aids  . Bursitis disorder     bil shoulders  . AC (acromioclavicular) joint bone spurs    Past Surgical History  Procedure Laterality Date  . Breast surgery    . Appendectomy    . Tonsillectomy    . Spine surgery    . Cosmetic surgery      tummy tuck /remove excess skin arms and legs  . Carpat tunnel  release      right hand  . Hand tendon surgery      right hand  . Abdominal hysterectomy      uterine prolapse    reports that she has never smoked. She has never used smokeless tobacco. She reports that she does not drink alcohol or use illicit drugs. family history includes Cancer in her mother; Hypertension in her sister. Allergies  Allergen Reactions  . Codeine     nausea      Review of Systems  Constitutional: Negative for fever and chills.  HENT: Positive for hearing loss (Chronic and unchanged). Negative for congestion, ear discharge, ear pain, sore throat and tinnitus.   Neurological: Negative for dizziness and headaches.       Objective:   Physical Exam  Constitutional: She is oriented to person, place, and time. She appears well-developed and well-nourished.  HENT:  Right Ear: External ear normal.  Left Ear: External ear  normal.  Mouth/Throat: Oropharynx is clear and moist.  Neck: Neck supple.  Cardiovascular: Normal rate and regular rhythm.  Exam reveals no gallop.   Pulmonary/Chest: Effort normal and breath sounds normal. No respiratory distress. She has no wheezes. She has no rales.  Neurological: She is alert and oriented to person, place, and time. No cranial nerve deficit.          Assessment & Plan:  Bilateral ear fullness. Question barotitis media. No evidence for cerumen impaction. Nonfocal exam. No visible effusions. Give this another 2-3 weeks. if Not resolving by then consider ENT referral

## 2014-05-01 ENCOUNTER — Other Ambulatory Visit: Payer: Self-pay | Admitting: Family Medicine

## 2014-05-03 ENCOUNTER — Telehealth: Payer: Self-pay

## 2014-05-03 MED ORDER — EST ESTROGENS-METHYLTEST 1.25-2.5 MG PO TABS: 1.0000 | ORAL_TABLET | Freq: Every day | ORAL | Status: DC

## 2014-05-03 NOTE — Telephone Encounter (Signed)
Refill for one year 

## 2014-05-03 NOTE — Telephone Encounter (Signed)
Rx faxed to pharmacy  

## 2014-05-03 NOTE — Telephone Encounter (Signed)
eSTROGEN-methyltestosterone  Last visit 03/29/14 Last refill 10/08/13 #90 3 refill

## 2014-06-24 DIAGNOSIS — J3489 Other specified disorders of nose and nasal sinuses: Secondary | ICD-10-CM | POA: Diagnosis not present

## 2014-06-24 DIAGNOSIS — H903 Sensorineural hearing loss, bilateral: Secondary | ICD-10-CM | POA: Diagnosis not present

## 2014-07-06 DIAGNOSIS — H903 Sensorineural hearing loss, bilateral: Secondary | ICD-10-CM | POA: Diagnosis not present

## 2014-07-06 DIAGNOSIS — J3489 Other specified disorders of nose and nasal sinuses: Secondary | ICD-10-CM | POA: Diagnosis not present

## 2014-07-27 ENCOUNTER — Encounter (HOSPITAL_COMMUNITY): Payer: Self-pay

## 2014-07-27 ENCOUNTER — Emergency Department (HOSPITAL_COMMUNITY)
Admission: EM | Admit: 2014-07-27 | Discharge: 2014-07-28 | Disposition: A | Discharge status: Home / Self Care | Payer: Medicare Other | Attending: Emergency Medicine | Admitting: Emergency Medicine

## 2014-07-27 DIAGNOSIS — I10 Essential (primary) hypertension: Secondary | ICD-10-CM | POA: Insufficient documentation

## 2014-07-27 DIAGNOSIS — K219 Gastro-esophageal reflux disease without esophagitis: Secondary | ICD-10-CM | POA: Insufficient documentation

## 2014-07-27 DIAGNOSIS — Y9289 Other specified places as the place of occurrence of the external cause: Secondary | ICD-10-CM | POA: Diagnosis not present

## 2014-07-27 DIAGNOSIS — X58XXXA Exposure to other specified factors, initial encounter: Secondary | ICD-10-CM | POA: Insufficient documentation

## 2014-07-27 DIAGNOSIS — Y9389 Activity, other specified: Secondary | ICD-10-CM | POA: Diagnosis not present

## 2014-07-27 DIAGNOSIS — T161XXA Foreign body in right ear, initial encounter: Secondary | ICD-10-CM | POA: Diagnosis not present

## 2014-07-27 DIAGNOSIS — Y998 Other external cause status: Secondary | ICD-10-CM | POA: Diagnosis not present

## 2014-07-27 DIAGNOSIS — E039 Hypothyroidism, unspecified: Secondary | ICD-10-CM | POA: Diagnosis not present

## 2014-07-27 DIAGNOSIS — H9190 Unspecified hearing loss, unspecified ear: Secondary | ICD-10-CM | POA: Diagnosis not present

## 2014-07-27 DIAGNOSIS — Z87448 Personal history of other diseases of urinary system: Secondary | ICD-10-CM | POA: Insufficient documentation

## 2014-07-27 DIAGNOSIS — Z8739 Personal history of other diseases of the musculoskeletal system and connective tissue: Secondary | ICD-10-CM | POA: Diagnosis not present

## 2014-07-27 DIAGNOSIS — Z79899 Other long term (current) drug therapy: Secondary | ICD-10-CM | POA: Insufficient documentation

## 2014-07-27 NOTE — ED Notes (Signed)
Pt presents with c/o foreign body in her right ear. Pt reports she had an adjustment made to her hearing aid today and one of the small pieces is now lodged down into her ear. Pt reports she feels the object in her ear but is unable to retrieve it.

## 2014-07-28 NOTE — ED Provider Notes (Signed)
CSN: [id number]     Arrival date & time 07/27/14  2306 History   First MD Initiated Contact with Patient 07/28/14 0006     Chief Complaint  Patient presents with  . Foreign Body in [place]     (Consider location/radiation/quality/duration/timing/severity/associated sxs/prior Treatment) HPI Comments: She got new hearing aids today and a piece is now lodged in her ear.  They tried getting it out with tweezers only to pursue further in   Patient is a 74 y.o. female presenting with foreign body in ear. The history is provided by the patient.  Foreign Body in Ear This is a new problem. The current episode started today. The problem occurs constantly. The problem has been unchanged. Pertinent negatives include no fever or headaches. Nothing aggravates the symptoms. She has tried nothing for the symptoms. The treatment provided no relief.    Past Medical History  Diagnosis Date  . OA (osteoarthritis)   . Hypertension   . Thyroid disease   . Hypothyroidism   . Hypothyroidism   . Prolapse of female bladder, acquired   . GERD (gastroesophageal reflux disease)   . Hearing loss     bil / no hearing aids  . Bursitis disorder     bil shoulders  . AC (acromioclavicular) joint bone spurs    Past Surgical History  Procedure Laterality Date  . Breast surgery    . Appendectomy    . Tonsillectomy    . Spine surgery    . Cosmetic surgery      tummy tuck /remove excess skin arms and legs  . Carpat tunnel  release      right hand  . Hand tendon surgery      right hand  . Abdominal hysterectomy      uterine prolapse   Family History  Problem Relation Age of Onset  . Cancer Mother     ?lung cancer  . Hypertension Sister    History  Substance Use Topics  . Smoking status: Never Smoker   . Smokeless tobacco: Never Used  . Alcohol Use: No   OB History    No data available     Review of Systems  Constitutional: Negative for fever.  HENT: Positive for ear pain.   Neurological:  Negative for dizziness and headaches.  All other systems reviewed and are negative.     Allergies  Codeine  Home Medications   Prior to Admission medications   Medication Sig Start Date End Date Taking? Authorizing Provider  Ascorbic Acid (VITAMIN C PO) Take by mouth daily.      Historical Provider, MD  b complex vitamins tablet Take 1 tablet by mouth daily.    Historical Provider, MD  Calcium 600-200 MG-UNIT per tablet Take 1 tablet by mouth daily.      Historical Provider, MD  Calcium Carbonate (CALTRATE 600 PO) Take by mouth daily.      Historical Provider, MD  clotrimazole-betamethasone (LOTRISONE) cream Apply topically 2 (two) times daily. 10/01/12   Eulas Post, MD  Cyanocobalamin (VITAMIN B 12 PO) Take by mouth daily.      Historical Provider, MD  cyclobenzaprine (FLEXERIL) 10 MG tablet Take 1 tablet (10 mg total) by mouth 3 (three) times daily as needed. 10/08/13   Eulas Post, MD  estrogen-methylTESTOSTERone (ESTRATEST) 1.25-2.5 MG per tablet Take 1 tablet by mouth daily. 05/03/14   Eulas Post, MD  Glucosamine-Chondroitin (OSTEO BI-FLEX REGULAR STRENGTH PO) Take 1 tablet by mouth daily.  Historical Provider, MD  ibuprofen (ADVIL,MOTRIN) 800 MG tablet 1 tid pc for bursitis and arthritis 09/27/10   Hoover Browns., MD  levothyroxine (SYNTHROID) 50 MCG tablet Take 1 tablet (50 mcg total) by mouth daily. 10/08/13   Eulas Post, MD  multivitamin [facility]) per tablet Take 1 tablet by mouth daily. Centrum MVI -Take one daily    Historical Provider, MD  omeprazole (PRILOSEC) 20 MG capsule Take 20 mg by mouth daily.    Historical Provider, MD  triamterene-hydrochlorothiazide (MAXZIDE) 75-50 MG per tablet Take 1 tablet by mouth daily. 10/08/13 11/07/14  Eulas Post, MD   BP 156/96 mmHg  Pulse 82  Temp(Src) 97.6 F (36.4 C) (Oral)  Resp 18  SpO2 99% Physical Exam  Constitutional: She appears well-developed and well-nourished.  HENT:  Head:  Normocephalic.  Left Ear: External ear normal.  Mall, gray, soft rubber piece from hearing aid lodged in right ear removed successfully and  Neck: Normal range of motion.  Cardiovascular: Normal rate.   Pulmonary/Chest: Effort normal.  Musculoskeletal: Normal range of motion.  Nursing note and vitals reviewed.   ED Course  FOREIGN BODY REMOVAL Date/Time: 07/28/2014 12:30 AM Performed by: Junius Creamer Authorized by: Junius Creamer Consent: Verbal consent obtained. Written consent not obtained. Risks and benefits: risks, benefits and alternatives were discussed Consent given by: patient Patient understanding: patient states understanding of the procedure being performed Patient identity confirmed: verbally with patient Time out: Immediately prior to procedure a "time out" was called to verify the correct patient, procedure, equipment, support staff and site/side marked as required. Body area: ear Location details: right ear Patient sedated: no Patient restrained: no Patient cooperative: yes Localization method: visualized Removal mechanism: alligator forceps Complexity: simple 1 objects recovered. Objects recovered: hearing aid part Post-procedure assessment: foreign body removed Patient tolerance: Patient tolerated the procedure well with no immediate complications   (including critical care time) Labs Review Labs Reviewed - No data to display  Imaging Review No results found.   EKG Interpretation None      MDM   Final diagnoses:  Foreign body in ear, right, initial encounter         Junius Creamer, NP 07/28/14 0031  Varney Biles, MD 08/01/14 5883

## 2014-07-28 NOTE — Discharge Instructions (Signed)
Ear Foreign Body An ear foreign body is an object that is stuck in the ear. It is common for young children to put objects into the ear canal. These may include pebbles, beads, beans, and any other small objects which will fit. In adults, objects such as cotton swabs may become lodged in the ear canal. In all ages, the most common foreign bodies are insects that enter the ear canal.  SYMPTOMS  Foreign bodies may cause pain, buzzing or roaring sounds, hearing loss, and ear drainage.  HOME CARE INSTRUCTIONS   Keep all follow-up appointments with your caregiver as told.  Keep small objects out of reach of young children. Tell them not to put anything in their ears. SEEK IMMEDIATE MEDICAL CARE IF:   You have bleeding from the ear.  You have increased pain or swelling of the ear.  You have reduced hearing.  You have discharge coming from the ear.  You have a fever.  You have a headache. MAKE SURE YOU:   Understand these instructions.  Will watch your condition.  Will get help right away if you are not doing well or get worse. Document Released: 02/10/2000 Document Revised: 05/07/2011 Document Reviewed: 10/01/2007 [facility] Patient Information 2015 [place], [place]. This information is not intended to replace advice given to you by your health care provider. Make sure you discuss any questions you have with your health care provider. Foreign body was removed intact

## 2014-08-05 DIAGNOSIS — H524 Presbyopia: Secondary | ICD-10-CM | POA: Diagnosis not present

## 2014-08-05 DIAGNOSIS — H2513 Age-related nuclear cataract, bilateral: Secondary | ICD-10-CM | POA: Diagnosis not present

## 2014-08-05 DIAGNOSIS — H40013 Open angle with borderline findings, low risk, bilateral: Secondary | ICD-10-CM | POA: Diagnosis not present

## 2014-08-13 ENCOUNTER — Ambulatory Visit (INDEPENDENT_AMBULATORY_CARE_PROVIDER_SITE_OTHER): Payer: Medicare Other | Admitting: Neurology

## 2014-08-13 ENCOUNTER — Encounter: Payer: Self-pay | Admitting: Neurology

## 2014-08-13 VITALS — BP 130/72 | HR 80 | Resp 20 | Ht 68.4 in | Wt 189.6 lb

## 2014-08-13 DIAGNOSIS — R110 Nausea: Secondary | ICD-10-CM | POA: Diagnosis not present

## 2014-08-13 DIAGNOSIS — R42 Dizziness and giddiness: Secondary | ICD-10-CM | POA: Diagnosis not present

## 2014-08-13 NOTE — Progress Notes (Signed)
NEUROLOGY CONSULTATION NOTE  Stacy Wagner MRN: [id number] DOB: January 12, 1941  Referring provider: Dr. Thornell Mule Primary care provider: Dr. Elease Hashimoto  Reason for consult:  Vertigo, migraine  HISTORY OF PRESENT ILLNESS: Stacy Wagner is a 74 year old right-handed woman with hypertension, hypothyroidism, degenerative cervical disc disease, GERD and osteoarthritis who presents for migraine and vertigo.  She reports dizziness for many years, but she feels it has more recently gotten worse.  She describes a "funny feeling in my head" but cannot elaborate.  She denies spinning sensation or sensation of movement of herself or her surroundings.  It is not episodic.  She always has a sense of this dizziness and  Movement can exacerbate it.  She will always feel dizzy or nauseous if she quickly sits straight up from supine position.  When she is in bed, she will turn to her side before lifting herself up.  It is also associated with nausea without vomiting.  She is able to ambulate.  Sometimes, movement can trigger nausea without the dizziness.  Since it is not clearly episodic with distinct beginning and end, it is difficult for her to give an estimated duration of symptoms.  On only one occasion did she have a distinct onset of episode with spinning sensation, nausea and inability to ambulate.  She has remote history of left-sided migraines, which have since resolved.  On very rare occasions, she may note a mild headache.  She sees Dr. Thornell Mule, ENT for aural fullness and bilateral moderate sensorineural hearing loss.  She mentioned these symptoms to him.  He did perform an ENT.  I do not have these results, but he told her the dizziness is not coming from the ear.  PAST MEDICAL HISTORY: Past Medical History  Diagnosis Date  . OA (osteoarthritis)   . Hypertension   . Thyroid disease   . Hypothyroidism   . Hypothyroidism   . Prolapse of female bladder, acquired   . GERD (gastroesophageal reflux  disease)   . Hearing loss     bil / no hearing aids  . Bursitis disorder     bil shoulders  . AC (acromioclavicular) joint bone spurs     PAST SURGICAL HISTORY: Past Surgical History  Procedure Laterality Date  . Breast surgery    . Appendectomy    . Tonsillectomy    . Spine surgery    . Cosmetic surgery      tummy tuck /remove excess skin arms and legs  . Carpat tunnel  release      right hand  . Hand tendon surgery      right hand  . Abdominal hysterectomy      uterine prolapse    MEDICATIONS: Current Outpatient Prescriptions on File Prior to Visit  Medication Sig Dispense Refill  . clotrimazole-betamethasone (LOTRISONE) cream Apply topically 2 (two) times daily. 30 g 2  . cyclobenzaprine (FLEXERIL) 10 MG tablet Take 1 tablet (10 mg total) by mouth 3 (three) times daily as needed. 90 tablet 3  . estrogen-methylTESTOSTERone (ESTRATEST) 1.25-2.5 MG per tablet Take 1 tablet by mouth daily. 90 tablet 3  . ibuprofen (ADVIL,MOTRIN) 800 MG tablet 1 tid pc for bursitis and arthritis 90 tablet 11  . levothyroxine (SYNTHROID) 50 MCG tablet Take 1 tablet (50 mcg total) by mouth daily. 90 tablet 3  . omeprazole (PRILOSEC) 20 MG capsule Take 20 mg by mouth daily.    Marland Kitchen triamterene-hydrochlorothiazide (MAXZIDE) 75-50 MG per tablet Take 1 tablet by mouth daily. 90 tablet 3  .  Ascorbic Acid (VITAMIN C PO) Take by mouth daily.      Marland Kitchen b complex vitamins tablet Take 1 tablet by mouth daily.    . Calcium 600-200 MG-UNIT per tablet Take 1 tablet by mouth daily.      . Calcium Carbonate (CALTRATE 600 PO) Take by mouth daily.      . Cyanocobalamin (VITAMIN B 12 PO) Take by mouth daily.      . Glucosamine-Chondroitin (OSTEO BI-FLEX REGULAR STRENGTH PO) Take 1 tablet by mouth daily.    . multivitamin (THERAGRAN) per tablet Take 1 tablet by mouth daily. Centrum MVI -Take one daily     No current facility-administered medications on file prior to visit.    ALLERGIES: Allergies  Allergen  Reactions  . Codeine     nausea    FAMILY HISTORY: Family History  Problem Relation Age of Onset  . Cancer Mother     ?lung cancer  . Hypertension Sister   . Diabetes Father     SOCIAL HISTORY: History   Social History  . Marital Status: Married    Spouse Name: N/A  . Number of Children: N/A  . Years of Education: N/A   Occupational History  . Not on file.   Social History Main Topics  . Smoking status: Never Smoker   . Smokeless tobacco: Never Used  . Alcohol Use: No  . Drug Use: No  . Sexual Activity:    Partners: Male   Other Topics Concern  . Not on file   Social History Narrative    REVIEW OF SYSTEMS: Constitutional: No fevers, chills, or sweats, no generalized fatigue, change in appetite Eyes: No visual changes, double vision, eye pain Ear, nose and throat: Hearing loss, no ear pain, nasal congestion, sore throat Cardiovascular: No chest pain, palpitations Respiratory:  No shortness of breath at rest or with exertion, wheezes GastrointestinaI: as above Genitourinary:  No dysuria, urinary retention or frequency Musculoskeletal:  No neck pain, back pain Integumentary: No rash, pruritus, skin lesions Neurological: as above Psychiatric: No depression, insomnia, anxiety Endocrine: No palpitations, fatigue, diaphoresis, mood swings, change in appetite, change in weight, increased thirst Hematologic/Lymphatic:  No anemia, purpura, petechiae. Allergic/Immunologic: no itchy/runny eyes, nasal congestion, recent allergic reactions, rashes  PHYSICAL EXAM: Filed Vitals:   08/13/14 0955  BP: 130/72  Pulse: 80  Resp: 20   General: No acute distress Head:  Normocephalic/atraumatic Eyes:  fundi unremarkable, without vessel changes, exudates, hemorrhages or papilledema. Neck: supple, no paraspinal tenderness, full range of motion Back: No paraspinal tenderness Heart: regular rate and rhythm Lungs: Clear to auscultation bilaterally. Vascular: No carotid  bruits. Neurological Exam: Mental status: alert and oriented to person, place, and time, recent and remote memory intact, fund of knowledge intact, attention and concentration intact, speech fluent and not dysarthric, language intact. Cranial nerves: CN I: not tested CN II: pupils equal, round and reactive to light, visual fields intact, fundi unremarkable, without vessel changes, exudates, hemorrhages or papilledema. CN III, IV, VI:  full range of motion, no nystagmus, no ptosis CN V: facial sensation intact CN VII: upper and lower face symmetric CN VIII: hearing intact CN IX, X: gag intact, uvula midline CN XI: sternocleidomastoid and trapezius muscles intact CN XII: tongue midline Bulk & Tone: normal, no fasciculations. Motor:  5/5 throughout Sensation:  Mildly reduced pinprick and vibratory sensory loss in feet. Deep Tendon Reflexes:  2+ throughout, toes downgoing Finger to nose testing:  No dysmetria Heel to shin:  No dysmetria Gait:  Normal station and stride.  Able to turn and tandem walk. Romberg negative.  IMPRESSION: Dizziness Nausea History of migraine  She seems to have a vestibulopathy, however it does not correlate with vestibular migraine.  She does not describe clear episodic spells with onset and end.  It is more of a vague constant nausea or dizziness that can fluctuate throughout the day.  It happens daily for many years.  PLAN: Since she says it has been getting worse, we will check MRI of brain.  Further recommendations pending results.  Otherwise, I don't think there is anything specific from a neurologic perspective to treat the symptoms.    45 minutes spent with patient face to face, over 50% spent discussing possible diagnoses and management.  Thank you for allowing me to take part in the care of this patient.  Metta Clines, DO  CC:  Vicie Mutters, MD  Carolann Littler, MD

## 2014-08-13 NOTE — Patient Instructions (Addendum)
Since the dizziness is getting worse, I would get an MRI of the brain without contrast.  [facility]  08/24/14 3:45pm We will let you know what the results show and will give further recommendations based on results.

## 2014-08-24 ENCOUNTER — Ambulatory Visit (HOSPITAL_COMMUNITY)
Admission: RE | Admit: 2014-08-24 | Discharge: 2014-08-24 | Disposition: A | Discharge status: Home / Self Care | Payer: Medicare Other | Source: Ambulatory Visit | Attending: Neurology | Admitting: Neurology

## 2014-08-24 DIAGNOSIS — I10 Essential (primary) hypertension: Secondary | ICD-10-CM | POA: Insufficient documentation

## 2014-08-24 DIAGNOSIS — Z8673 Personal history of transient ischemic attack (TIA), and cerebral infarction without residual deficits: Secondary | ICD-10-CM | POA: Diagnosis not present

## 2014-08-24 DIAGNOSIS — R42 Dizziness and giddiness: Secondary | ICD-10-CM | POA: Insufficient documentation

## 2014-08-24 DIAGNOSIS — M4802 Spinal stenosis, cervical region: Secondary | ICD-10-CM | POA: Insufficient documentation

## 2014-08-24 DIAGNOSIS — I739 Peripheral vascular disease, unspecified: Secondary | ICD-10-CM | POA: Insufficient documentation

## 2014-08-24 DIAGNOSIS — R110 Nausea: Secondary | ICD-10-CM

## 2014-08-25 ENCOUNTER — Telehealth: Payer: Self-pay | Admitting: *Deleted

## 2014-08-25 NOTE — Telephone Encounter (Signed)
Patient is awareMRI shows no specific cause of dizziness. There is evidence of small vessel disease and an old very tiny stroke, likely related to history of high blood pressure. I would recommend starting aspirin 81mg  daily. Neurology follow-up not warranted. MRI shows no specific cause of dizziness. There is evidence of small vessel disease and an old very tiny stroke, likely related to history of high blood pressure. I would recommend starting aspirin 81mg  daily. Neurology follow-up not warranted.

## 2014-08-25 NOTE — Telephone Encounter (Signed)
-----   Message from Pieter Partridge, DO sent at 08/25/2014  1:51 PM EDT ----- MRI shows no specific cause of dizziness.  There is evidence of small vessel disease and an old very tiny stroke, likely related to history of high blood pressure.  I would recommend starting aspirin 81mg  daily.  Neurology follow-up not warranted.

## 2014-10-06 ENCOUNTER — Ambulatory Visit: Payer: Medicare Other | Admitting: Neurology

## 2014-10-07 ENCOUNTER — Encounter: Payer: Self-pay | Admitting: *Deleted

## 2014-10-15 ENCOUNTER — Encounter: Payer: Self-pay | Admitting: Family Medicine

## 2014-10-15 ENCOUNTER — Ambulatory Visit (INDEPENDENT_AMBULATORY_CARE_PROVIDER_SITE_OTHER): Payer: Medicare Other | Admitting: Family Medicine

## 2014-10-15 VITALS — BP 130/78 | HR 80 | Temp 97.8°F | Ht 67.5 in | Wt 187.0 lb

## 2014-10-15 DIAGNOSIS — Z0000 Encounter for general adult medical examination without abnormal findings: Secondary | ICD-10-CM

## 2014-10-15 DIAGNOSIS — E039 Hypothyroidism, unspecified: Secondary | ICD-10-CM | POA: Diagnosis not present

## 2014-10-15 DIAGNOSIS — Z780 Asymptomatic menopausal state: Secondary | ICD-10-CM | POA: Insufficient documentation

## 2014-10-15 DIAGNOSIS — I10 Essential (primary) hypertension: Secondary | ICD-10-CM

## 2014-10-15 LAB — BASIC METABOLIC PANEL
BUN: 18 mg/dL (ref 6–23)
CO2: 29 mEq/L (ref 19–32)
Calcium: 9.5 mg/dL (ref 8.4–10.5)
Chloride: 103 mEq/L (ref 96–112)
Creatinine, Ser: 0.81 mg/dL (ref 0.40–1.20)
GFR: 73.88 mL/min (ref >60.00)
Glucose, Bld: 76 mg/dL (ref 70–99)
POTASSIUM: 4.2 meq/L (ref 3.5–5.1)
SODIUM: 141 meq/L (ref 135–145)

## 2014-10-15 LAB — TSH: TSH: 2.17 u[IU]/mL (ref 0.35–4.50)

## 2014-10-15 MED ORDER — EST ESTROGENS-METHYLTEST 1.25-2.5 MG PO TABS: 1.0000 | ORAL_TABLET | Freq: Every day | ORAL | Status: DC

## 2014-10-15 MED ORDER — LEVOTHYROXINE SODIUM 50 MCG PO TABS: 50.0000 ug | ORAL_TABLET | Freq: Every day | ORAL | Status: DC

## 2014-10-15 MED ORDER — TRIAMTERENE-HCTZ 75-50 MG PO TABS: 1.0000 | ORAL_TABLET | Freq: Every day | ORAL | Status: DC

## 2014-10-15 NOTE — Progress Notes (Signed)
Pre visit review using our clinic review tool, if applicable. No additional management support is needed unless otherwise documented below in the visit note. 

## 2014-10-15 NOTE — Patient Instructions (Signed)
Remember to set up yearly mammogram You should get repeat colonoscopy this fall.  Left me know if gastroenterology office has not contacted you in a few weeks Recommend yearly flu vaccine

## 2014-10-15 NOTE — Progress Notes (Signed)
Subjective:    Patient ID: Stacy Wagner, female    DOB: 10/22/1940, 74 y.o.   MRN: [id number]  HPI Patient seen for Medicare Wellness exam and medical follow-up. Her chronic problems include intermittent vertigo (which has been worked previously by neurology), GERD, hypothyroidism, hypertension, hyperlipidemia. Compliant with prescription medications. She's not yet had mammogram this year. She'll be due for repeat colonoscopy later this year. She's had previous polyps. Takes Estratest for postmenopausal symptoms and is reluctant to stop. No history of osteoporosis.  Past Medical History  Diagnosis Date  . OA (osteoarthritis)   . Hypertension   . Thyroid disease   . Hypothyroidism   . Prolapse of female bladder, acquired   . GERD (gastroesophageal reflux disease)   . Hearing loss     bil / no hearing aids  . Bursitis disorder     bil shoulders  . AC (acromioclavicular) joint bone spurs    Past Surgical History  Procedure Laterality Date  . Breast surgery    . Appendectomy    . Tonsillectomy    . Spine surgery    . Cosmetic surgery      tummy tuck /remove excess skin arms and legs  . Carpat tunnel  release      right hand  . Hand tendon surgery      right hand  . Abdominal hysterectomy      uterine prolapse    reports that she has never smoked. She has never used smokeless tobacco. She reports that she does not drink alcohol or use illicit drugs. family history includes Cancer in her mother; Diabetes in her father; Hypertension in her sister. Allergies  Allergen Reactions  . Codeine     nausea   1.  Risk factors based on Past Medical , Social, and Family history reviewed and as indicated above with no changes 2.  Limitations in physical activities None.  No recent falls. 3.  Depression/mood No active depression or anxiety issues 4.  Hearing bilateral hearing aids 5.  ADLs independent in all. 6.  Cognitive function (orientation to time and place, language, writing,  speech,memory) no short or long term memory issues.  Language and judgement intact. 7.  Home Safety no issues 8.  Height, weight, and visual acuity.all stable. 9.  Counseling discussed daily calcium and vitamin D. Encouraged her to consider DEXA scan 10. Recommendation of preventive services. Yearly flu vaccine. Repeat mammogram this year. Repeat colonoscopy this fall 11. Labs based on risk factors TSH and basic metabolic panel 12. Care Plan as above 13. Other Providers Dr Anselmo Pickler. 14. Written schedule of screening/prevention services given to patient.    Review of Systems  Constitutional: Negative for fever, activity change, appetite change, fatigue and unexpected weight change.  HENT: Negative for ear pain, hearing loss, sore throat and trouble swallowing.   Eyes: Negative for visual disturbance.  Respiratory: Negative for cough and shortness of breath.   Cardiovascular: Negative for chest pain and palpitations.  Gastrointestinal: Negative for abdominal pain, diarrhea, constipation and blood in stool.  Endocrine: Negative for polydipsia and polyuria.  Genitourinary: Negative for dysuria and hematuria.  Musculoskeletal: Positive for arthralgias. Negative for myalgias and back pain.  Skin: Negative for rash.  Neurological: Negative for dizziness, syncope and headaches.  Hematological: Negative for adenopathy.  Psychiatric/Behavioral: Negative for confusion and dysphoric mood.       Objective:   Physical Exam  Constitutional: She is oriented to person, place, and time. She appears well-developed and well-nourished.  HENT:  Head: Normocephalic and atraumatic.  Eyes: EOM are normal. Pupils are equal, round, and reactive to light.  Neck: Normal range of motion. Neck supple. No thyromegaly present.  Cardiovascular: Normal rate, regular rhythm and normal heart sounds.   No murmur heard. Pulmonary/Chest: Breath sounds normal. No respiratory distress. She has no wheezes. She has no  rales.  Abdominal: Soft. Bowel sounds are normal. She exhibits no distension and no mass. There is no tenderness. There is no rebound and no guarding.  Musculoskeletal: Normal range of motion. She exhibits no edema.  Lymphadenopathy:    She has no cervical adenopathy.  Neurological: She is alert and oriented to person, place, and time. She displays normal reflexes. No cranial nerve deficit.  Skin: No rash noted.  Psychiatric: She has a normal mood and affect. Her behavior is normal. Judgment and thought content normal.          Assessment & Plan:  #1 Medicare wellness visit. We discussed yearly flu vaccine. Other immunizations up-to-date. Repeat mammogram this year. She will need repeat colonoscopy around October of this year #2 hypertension stable and at goal. Check basic metabolic panel. Refill Maxzide for one year #3 hypothyroidism. Recheck TSH. Refill levothyroxin for one year #4 postmenopausal. Discussed pros and cons of ongoing hormone use. She is reluctant to discontinue. Prescription provided. Schedule mammogram as above.

## 2014-10-24 ENCOUNTER — Encounter: Payer: Self-pay | Admitting: Internal Medicine

## 2014-10-31 ENCOUNTER — Ambulatory Visit (INDEPENDENT_AMBULATORY_CARE_PROVIDER_SITE_OTHER): Payer: Medicare Other | Admitting: Internal Medicine

## 2014-10-31 ENCOUNTER — Other Ambulatory Visit: Payer: Self-pay | Admitting: Family Medicine

## 2014-10-31 VITALS — BP 130/74 | HR 83 | Temp 97.4°F | Resp 14 | Ht 68.0 in | Wt 190.4 lb

## 2014-10-31 DIAGNOSIS — M25512 Pain in left shoulder: Secondary | ICD-10-CM

## 2014-10-31 DIAGNOSIS — M7552 Bursitis of left shoulder: Secondary | ICD-10-CM | POA: Diagnosis not present

## 2014-10-31 MED ORDER — PREDNISONE 20 MG PO TABS: ORAL_TABLET | ORAL | Status: DC

## 2014-10-31 MED ORDER — MELOXICAM 15 MG PO TABS: 15.0000 mg | ORAL_TABLET | Freq: Every day | ORAL | Status: DC

## 2014-10-31 MED ORDER — TRIAMCINOLONE ACETONIDE 40 MG/ML IJ SUSP
40.0000 mg | Freq: Once | INTRAMUSCULAR | Status: AC
  Administered 2014-10-31: 40 mg via INTRA_ARTICULAR

## 2014-10-31 NOTE — Progress Notes (Signed)
Subjective:  This chart was scribed for Tami Lin , MD by Thea Alken, ED Scribe. This patient was seen in room 7 and the patient's care was started at 11:23 AM.   Patient ID: Stacy Wagner, female    DOB: 1940/11/12, 74 y.o.   MRN: [id number]  HPI   Chief Complaint  Patient presents with  . Bursitis    Left shoulder   HPI Comments: Stacy Wagner is a 74 y.o. female who presents to the [facility] complaining of left shoulder pain that began 2 days ago. She reports pain with movement and with touch. She has taken ibuprofen with minimal relief to pain. She has hx of bursitis ,6-7 years ago and  received an injection for with relief to the pain a couple days later. She reports current pain is similar to when she had bursitis. She denies hx of gout.   Patient Active Problem List   Diagnosis Date Noted  . Postmenopausal 10/15/2014  . Dizziness and giddiness 08/13/2014  . Nausea without vomiting 08/13/2014  . GERD (gastroesophageal reflux disease) 10/08/2013  . Obesity (BMI 30-39.9) 10/01/2012  . VITAMIN D DEFICIENCY 09/08/2009  . HYPERLIPIDEMIA 09/08/2009  . FREQUENCY, URINARY 09/08/2009  . UNS ADVRS EFF OTH RX MEDICINAL&BIOLOGICAL SBSTNC 09/08/2009  . INSOMNIA 08/25/2008  . WEIGHT GAIN 08/25/2008  . DEGENERATIVE JOINT DISEASE, CERVICAL SPINE 07/15/2008  . Essential hypertension 06/28/2008  . OSTEOARTHRITIS 06/28/2008  . NECK PAIN 06/28/2008  . Hypothyroidism 08/20/2007  . LABYRINTHITIS, CHRONIC 08/20/2007  . EDEMA 08/20/2007  . HEADACHE 08/20/2007  . SYNDROME, CARPAL TUNNEL 08/15/2006     Prior to Admission medications   Medication Sig Start Date End Date Taking? Authorizing Provider  estrogen-methylTESTOSTERone (ESTRATEST) 1.25-2.5 MG per tablet Take 1 tablet by mouth daily. 10/15/14  Yes Eulas Post, MD  levothyroxine (SYNTHROID) 50 MCG tablet Take 1 tablet (50 mcg total) by mouth daily. 10/15/14  Yes Eulas Post, MD    multivitamin [facility]) per tablet Take 1 tablet by mouth daily. Centrum MVI -Take one daily   Yes Historical Provider, MD  triamterene-hydrochlorothiazide (MAXZIDE) 75-50 MG per tablet Take 1 tablet by mouth daily. 10/15/14 11/14/15 Yes Eulas Post, MD  Ascorbic Acid (VITAMIN C PO) Take by mouth daily.      Historical Provider, MD  b complex vitamins tablet Take 1 tablet by mouth daily.    Historical Provider, MD  Calcium 600-200 MG-UNIT per tablet Take 1 tablet by mouth daily.      Historical Provider, MD  Calcium Carbonate (CALTRATE 600 PO) Take by mouth daily.      Historical Provider, MD  clotrimazole-betamethasone (LOTRISONE) cream Apply topically 2 (two) times daily. Patient not taking: Reported on 10/31/2014 10/01/12   Eulas Post, MD  Cyanocobalamin (VITAMIN B 12 PO) Take by mouth daily.      Historical Provider, MD  cyclobenzaprine (FLEXERIL) 10 MG tablet Take 1 tablet (10 mg total) by mouth 3 (three) times daily as needed. Patient not taking: Reported on 10/31/2014 10/08/13   Eulas Post, MD  Glucosamine-Chondroitin (OSTEO BI-FLEX REGULAR STRENGTH PO) Take 1 tablet by mouth daily.    Historical Provider, MD  ibuprofen (ADVIL,MOTRIN) 800 MG tablet 1 tid pc for bursitis and arthritis Patient not taking: Reported on 10/31/2014 09/27/10   Hoover Browns., MD  mupirocin ointment (BACTROBAN) 2 % APPLY TO AFFECTED AREA TWICE PER DAY 06/24/14   Historical Provider, MD  omeprazole (PRILOSEC) 20 MG capsule Take  20 mg by mouth daily.    Historical Provider, MD   Review of Systems  Musculoskeletal: Positive for arthralgias.  Neurological: Negative for weakness and numbness.   no fever chills or night sweats   Objective:   Physical Exam  Constitutional: She is oriented to person, place, and time. She appears well-developed and well-nourished. No distress.  HENT:  Head: Normocephalic and atraumatic.  Eyes: Conjunctivae and EOM are normal.  Neck: Neck supple.   Cardiovascular: Normal rate.   Pulmonary/Chest: Effort normal.  Musculoskeletal: Normal range of motion.  Limites ROM in left shoulder with pain esp internal rotation and elevation pasted 45 degrees she has painful arch. There is no swelling or redness. She has discrete tenderness over the top of the deltoid and in the bicepital groove.    Neurological: She is alert and oriented to person, place, and time.  Skin: Skin is warm and dry.  Psychiatric: She has a normal mood and affect. Her behavior is normal.  Nursing note and vitals reviewed.   Procedure After sterile prep, injected 40 mg kenalog, plus 2 cc lidocaine into the bursa and periarticular area without complication. Post injection with improved ROM and pain    Filed Vitals:   10/31/14 1039  BP: 130/74  Pulse: 83  Temp: 97.4 F (36.3 C)  TempSrc: Oral  Resp: 14  Height: 5\' 8"  (1.727 m)  Weight: 190 lb 6.4 oz (86.365 kg)  SpO2: 98%    Assessment & Plan:  Pain in joint, shoulder region, left - Plan: triamcinolone acetonide (KENALOG-40) injection 40 mg  Bursitis of deltoid, left  Meds ordered this encounter  Medications  . predniSONE (DELTASONE) 20 MG tablet    Sig: 3/3/2/2/1/1 single daily dose for 6 days    Dispense:  12 tablet    Refill:  0  . meloxicam (MOBIC) 15 MG tablet    Sig: Take 1 tablet (15 mg total) by mouth daily.    Dispense:  14 tablet    Refill:  0  . triamcinolone acetonide (KENALOG-40) injection 40 mg    Sig:    Ice tid ROM exercises adv as tol F/u 2 weeks if not full rom I have completed the patient encounter in its entirety as documented by the scribe, with editing by me where necessary. Jamirah Zelaya P. Laney Pastor, M.D.

## 2014-11-08 ENCOUNTER — Encounter: Payer: Self-pay | Admitting: Family Medicine

## 2014-11-08 ENCOUNTER — Ambulatory Visit (INDEPENDENT_AMBULATORY_CARE_PROVIDER_SITE_OTHER): Payer: Medicare Other | Admitting: Family Medicine

## 2014-11-08 VITALS — BP 130/70 | HR 86 | Temp 97.9°F | Wt 190.0 lb

## 2014-11-08 DIAGNOSIS — M7582 Other shoulder lesions, left shoulder: Secondary | ICD-10-CM | POA: Diagnosis not present

## 2014-11-08 NOTE — Patient Instructions (Signed)
Rotator Cuff Tendinitis  Rotator cuff tendinitis is inflammation of the tough, cord-like bands that connect muscle to bone (tendons) in your rotator cuff. Your rotator cuff is the collection of all the muscles and tendons that connect your arm to your shoulder. Your rotator cuff holds the head of your upper arm bone (humerus) in the cup (fossa) of your shoulder blade (scapula). CAUSES Rotator cuff tendinitis is usually caused by overusing the joint involved.  SIGNS AND SYMPTOMS  Deep ache in the shoulder also felt on the outside upper arm over the shoulder muscle.  Point tenderness over the area that is injured.  Pain comes on gradually and becomes worse with lifting the arm to the side (abduction) or turning it inward (internal rotation).  May lead to a chronic tear: When a rotator cuff tendon becomes inflamed, it runs the risk of losing its blood supply, causing some tendon fibers to die. This increases the risk that the tendon can fray and partially or completely tear. DIAGNOSIS Rotator cuff tendinitis is diagnosed by taking a medical history, performing a physical exam, and reviewing results of imaging exams. The medical history is useful to help determine the type of rotator cuff injury. The physical exam will include looking at the injured shoulder, feeling the injured area, and watching you do range-of-motion exercises. X-ray exams are typically done to rule out other causes of shoulder pain, such as fractures. MRI is the imaging exam usually used for significant shoulder injuries. Sometimes a dye study called CT arthrogram is done, but it is not as widely used as MRI. In some institutions, special ultrasound tests may also be used to aid in the diagnosis. TREATMENT  Less Severe Cases  Use of a sling to rest the shoulder for a short period of time. Prolonged use of the sling can cause stiffness, weakness, and loss of motion of the shoulder joint.  Anti-inflammatory medicines, such as  ibuprofen or naproxen sodium, may be prescribed. More Severe Cases  Physical therapy.  Use of steroid injections into the shoulder joint.  Surgery. HOME CARE INSTRUCTIONS   Use a sling or splint until the pain decreases. Prolonged use of the sling can cause stiffness, weakness, and loss of motion of the shoulder joint.  Apply ice to the injured area:  Put ice in a plastic bag.  Place a towel between your skin and the bag.  Leave the ice on for 20 minutes, 2-3 times a day.  Try to avoid use other than gentle range of motion while your shoulder is painful. Use the shoulder and exercise only as directed by your health care provider. Stop exercises or range of motion if pain or discomfort increases, unless directed otherwise by your health care provider.  Only take over-the-counter or prescription medicines for pain, discomfort, or fever as directed by your health care provider.  If you were given a shoulder sling and straps (immobilizer), do not remove it except as directed, or until you see a health care provider for a follow-up exam. If you need to remove it, move your arm as little as possible or as directed.  You may want to sleep on several pillows at night to lessen swelling and pain. SEEK IMMEDIATE MEDICAL CARE IF:   Your shoulder pain increases or new pain develops in your arm, hand, or fingers and is not relieved with medicines.  You have new, unexplained symptoms, especially increased numbness in the hands or loss of strength.  You develop any worsening of the problems  that brought you in for care.  Your arm, hand, or fingers are numb or tingling.  Your arm, hand, or fingers are swollen, painful, or turn white or blue. MAKE SURE YOU:  Understand these instructions.  Will watch your condition.  Will get help right away if you are not doing well or get worse. Document Released: 05/05/2003 Document Revised: 12/03/2012 Document Reviewed: 09/24/2012 [facility] Patient  Information 2015 [place], [place]. This information is not intended to replace advice given to you by your health care provider. Make sure you discuss any questions you have with your health care provider.  Continue with the Meloxicam once daily Try icing 2-3 times daily as discussed If no better in 2 weeks let me know and we can consider repeat injection.

## 2014-11-08 NOTE — Progress Notes (Signed)
   Subjective:    Patient ID: Stacy Wagner, female    DOB: [birth year], 74 y.o.   MRN: [id number]  HPI Urgent care follow-up. Patient seen with left shoulder pain. She was seen there one week ago and related onset just a few days prior to that visit. She had pain mostly on the left subacromial region with similar episode of "bursitis "6-7 years ago that improved following steroid injection. Patient received steroid injection at urgent care along with oral prednisone taper and she has seen minimal improvement. She'll also started on meloxicam once daily. She denies any injury. No radiculopathy symptoms. Pain with abduction against resistance. No biceps tenderness. No upper extremity weakness  Past Medical History  Diagnosis Date  . OA (osteoarthritis)   . Hypertension   . Thyroid disease   . Hypothyroidism   . Prolapse of female bladder, acquired   . GERD (gastroesophageal reflux disease)   . Hearing loss     bil / no hearing aids  . Bursitis disorder     bil shoulders  . AC (acromioclavicular) joint bone spurs    Past Surgical History  Procedure Laterality Date  . Breast surgery    . Appendectomy    . Tonsillectomy    . Spine surgery    . Cosmetic surgery      tummy tuck /remove excess skin arms and legs  . Carpat tunnel  release      right hand  . Hand tendon surgery      right hand  . Abdominal hysterectomy      uterine prolapse    reports that she has never smoked. She has never used smokeless tobacco. She reports that she does not drink alcohol or use illicit drugs. family history includes Cancer in her mother; Diabetes in her father; Hypertension in her sister. Allergies  Allergen Reactions  . Codeine     nausea      Review of Systems  Cardiovascular: Negative for chest pain.  Neurological: Negative for weakness and numbness.       Objective:   Physical Exam  Constitutional: She appears well-developed and well-nourished.  Cardiovascular: Normal rate and  regular rhythm.   Pulmonary/Chest: Effort normal and breath sounds normal. No respiratory distress. She has no wheezes. She has no rales.  Musculoskeletal:  Left shoulder full range of motion. She has no biceps tenderness today no acromioclavicular tenderness. She has pain with abduction greater than 90 against resistance. Minimal pain with internal rotation          Assessment & Plan:  Left shoulder pain. Suspect rotator cuff tendinitis/bursitis. Recent steroid injection above. She has not tried icing. We strongly encouraged icing 2-3 times daily. Continue range of motion exercises. Continue meloxicam. Give this another 2-3 weeks if not better then consider repeat steroid injection

## 2015-03-23 DIAGNOSIS — H524 Presbyopia: Secondary | ICD-10-CM | POA: Diagnosis not present

## 2015-03-23 DIAGNOSIS — H25813 Combined forms of age-related cataract, bilateral: Secondary | ICD-10-CM | POA: Diagnosis not present

## 2015-03-23 DIAGNOSIS — H02831 Dermatochalasis of right upper eyelid: Secondary | ICD-10-CM | POA: Diagnosis not present

## 2015-03-23 DIAGNOSIS — H401133 Primary open-angle glaucoma, bilateral, severe stage: Secondary | ICD-10-CM | POA: Diagnosis not present

## 2015-05-09 ENCOUNTER — Other Ambulatory Visit: Payer: Self-pay | Admitting: Family Medicine

## 2015-05-11 ENCOUNTER — Telehealth: Payer: Self-pay | Admitting: Family Medicine

## 2015-05-11 MED ORDER — EEMT 1.25-2.5 MG PO TABS: 1.0000 | ORAL_TABLET | Freq: Every day | ORAL | Status: DC

## 2015-05-11 NOTE — Telephone Encounter (Signed)
Printed for signature

## 2015-05-11 NOTE — Telephone Encounter (Addendum)
Pt called back to advise they will not fill electronically and pt needs a written rx to take to her pharmacy. Pt would like to pick up today if possible please.  Please call on mobile phone.

## 2015-05-11 NOTE — Telephone Encounter (Signed)
Please resend rx estrogens methyltest to [facility] [address]

## 2015-05-11 NOTE — Telephone Encounter (Signed)
Pt is aware that RX is up front.

## 2015-06-02 DIAGNOSIS — L570 Actinic keratosis: Secondary | ICD-10-CM | POA: Diagnosis not present

## 2015-06-02 DIAGNOSIS — L820 Inflamed seborrheic keratosis: Secondary | ICD-10-CM | POA: Diagnosis not present

## 2015-06-02 DIAGNOSIS — L309 Dermatitis, unspecified: Secondary | ICD-10-CM | POA: Diagnosis not present

## 2015-08-10 ENCOUNTER — Ambulatory Visit (INDEPENDENT_AMBULATORY_CARE_PROVIDER_SITE_OTHER): Payer: Medicare Other | Admitting: Family Medicine

## 2015-08-10 DIAGNOSIS — M7552 Bursitis of left shoulder: Secondary | ICD-10-CM | POA: Insufficient documentation

## 2015-08-10 MED ORDER — METHYLPREDNISOLONE ACETATE 80 MG/ML IJ SUSP
80.0000 mg | Freq: Once | INTRAMUSCULAR | Status: AC
  Administered 2015-08-10: 80 mg via INTRA_ARTICULAR

## 2015-08-10 NOTE — Progress Notes (Signed)
Pre visit review using our clinic review tool, if applicable. No additional management support is needed unless otherwise documented below in the visit note. 

## 2015-08-10 NOTE — Patient Instructions (Signed)

## 2015-08-10 NOTE — Progress Notes (Signed)
   Subjective:    Patient ID: Stacy Wagner, female    DOB: 1940/03/18, 75 y.o.   MRN: [id number]  HPI  Patient seen with recurrent left shoulder pain. Similar problem in the past. No recent injury. No radiculopathy symptoms. She had steroid injection back in September at urgent care which helped for several months. Current symptoms started over month ago. She has pain with internal rotation and abduction. Occasional night pain. Has not tried any recent icing. No relief with ibuprofen. No associated weakness. No upper extremity numbness.  Past Medical History  Diagnosis Date  . OA (osteoarthritis)   . Hypertension   . Thyroid disease   . Hypothyroidism   . Prolapse of female bladder, acquired   . GERD (gastroesophageal reflux disease)   . Hearing loss     bil / no hearing aids  . Bursitis disorder     bil shoulders  . AC (acromioclavicular) joint bone spurs    Past Surgical History  Procedure Laterality Date  . Breast surgery    . Appendectomy    . Tonsillectomy    . Spine surgery    . Cosmetic surgery      tummy tuck /remove excess skin arms and legs  . Carpat tunnel  release      right hand  . Hand tendon surgery      right hand  . Abdominal hysterectomy      uterine prolapse    reports that she has never smoked. She has never used smokeless tobacco. She reports that she does not drink alcohol or use illicit drugs. family history includes Cancer in her mother; Diabetes in her father; Hypertension in her sister. Allergies  Allergen Reactions  . Codeine     nausea     Review of Systems  Respiratory: Negative for shortness of breath.   Cardiovascular: Negative for chest pain.  Musculoskeletal: Negative for neck pain.  Neurological: Negative for weakness and numbness.       Objective:   Physical Exam  Constitutional: She appears well-developed and well-nourished.  Cardiovascular: Normal rate and regular rhythm.   Pulmonary/Chest: Effort normal and  breath sounds normal. No respiratory distress. She has no wheezes. She has no rales.  Musculoskeletal:  Shoulders are symmetric in appearance. No obvious swelling. No ecchymosis. She has no localized tenderness. She has pain with internal rotation and abduction greater than 90 against resistance. No biceps tenderness. No acromioclavicular tenderness.  Neurological:  Full strength rotator cuff testing. Symmetric reflexes          Assessment & Plan:  Recurrent left shoulder pain. Suspect rotator cuff tendinitis versus bursitis. No evidence for obvious weakness. She has benefited in the past from injection. She's not gotten relief recently from ibuprofen.  Discussed risks and benefits of corticosteroid injection and patient consented.  After prepping skin with betadine, injected 1 cc depomedrol and 2 cc of plain xylocaine with 25 gauge one and one half inch needle using posterior lateral approach and pt tolerated well. -She is instructed in range of motion exercises- -she is instructed in rotator cuff strengthening exercises after her acute pain subsides. She was given therapy and and exercise reviewed with her in detail- -she'll try some icing for the next few days -Touch base in 1-2 weeks if no improvement  Eulas Post MD [facility] at [facility]

## 2015-09-20 ENCOUNTER — Telehealth: Payer: Self-pay | Admitting: Family Medicine

## 2015-09-20 NOTE — Telephone Encounter (Signed)
Pt request refill  Est Estrogens-Methyltest (EEMT) 1.25-2.5 MG TABS  Pt has cpe on 8/24 and is out of this med. Can she get 30 days to get her through?  Pt states she has to pick up this rx due to it now being a controlled substance. ? Ok to leave message on cell phone.

## 2015-09-21 MED ORDER — EEMT 1.25-2.5 MG PO TABS
1.0000 | ORAL_TABLET | Freq: Every day | ORAL | 3 refills | Status: DC
Start: 2015-09-21 — End: 2015-10-17

## 2015-09-21 NOTE — Telephone Encounter (Signed)
RX printed, signed, faxed. Pt is aware via voicemail.

## 2015-10-17 ENCOUNTER — Ambulatory Visit (INDEPENDENT_AMBULATORY_CARE_PROVIDER_SITE_OTHER): Payer: Medicare Other | Admitting: Family Medicine

## 2015-10-17 ENCOUNTER — Other Ambulatory Visit: Payer: Self-pay

## 2015-10-17 ENCOUNTER — Encounter: Payer: Self-pay | Admitting: Family Medicine

## 2015-10-17 VITALS — BP 122/82 | HR 93 | Temp 97.9°F | Ht 68.0 in | Wt 195.0 lb

## 2015-10-17 DIAGNOSIS — Z0000 Encounter for general adult medical examination without abnormal findings: Secondary | ICD-10-CM | POA: Diagnosis not present

## 2015-10-17 DIAGNOSIS — E039 Hypothyroidism, unspecified: Secondary | ICD-10-CM | POA: Diagnosis not present

## 2015-10-17 DIAGNOSIS — M1990 Unspecified osteoarthritis, unspecified site: Secondary | ICD-10-CM | POA: Diagnosis not present

## 2015-10-17 DIAGNOSIS — E785 Hyperlipidemia, unspecified: Secondary | ICD-10-CM

## 2015-10-17 DIAGNOSIS — I10 Essential (primary) hypertension: Secondary | ICD-10-CM | POA: Diagnosis not present

## 2015-10-17 LAB — LIPID PANEL
CHOL/HDL RATIO: 5
Cholesterol: 210 mg/dL — ABNORMAL HIGH (ref 0–200)
HDL: 38.3 mg/dL — AB (ref >39.00)
LDL CALC: 132 mg/dL — AB (ref 0–99)
NONHDL: 171.33
TRIGLYCERIDES: 195 mg/dL — AB (ref 0.0–149.0)
VLDL: 39 mg/dL (ref 0.0–40.0)

## 2015-10-17 LAB — BASIC METABOLIC PANEL
BUN: 16 mg/dL (ref 6–23)
CHLORIDE: 102 meq/L (ref 96–112)
CO2: 26 meq/L (ref 19–32)
Calcium: 9.1 mg/dL (ref 8.4–10.5)
Creatinine, Ser: 0.88 mg/dL (ref 0.40–1.20)
GFR: 66.95 mL/min (ref >60.00)
GLUCOSE: 87 mg/dL (ref 70–99)
Potassium: 3.7 mEq/L (ref 3.5–5.1)
SODIUM: 138 meq/L (ref 135–145)

## 2015-10-17 LAB — HEPATIC FUNCTION PANEL
ALBUMIN: 4.2 g/dL (ref 3.5–5.2)
ALT: 20 U/L (ref 0–35)
AST: 25 U/L (ref 0–37)
Alkaline Phosphatase: 81 U/L (ref 39–117)
Bilirubin, Direct: 0.1 mg/dL (ref 0.0–0.3)
TOTAL PROTEIN: 7.4 g/dL (ref 6.0–8.3)
Total Bilirubin: 0.3 mg/dL (ref 0.2–1.2)

## 2015-10-17 LAB — TSH: TSH: 2.73 u[IU]/mL (ref 0.35–4.50)

## 2015-10-17 MED ORDER — EEMT 1.25-2.5 MG PO TABS: 1.0000 | ORAL_TABLET | Freq: Every day | ORAL | 3 refills | Status: DC

## 2015-10-17 MED ORDER — CYCLOBENZAPRINE HCL 10 MG PO TABS: 10.0000 mg | ORAL_TABLET | Freq: Three times a day (TID) | ORAL | 0 refills | Status: DC | PRN

## 2015-10-17 MED ORDER — LEVOTHYROXINE SODIUM 50 MCG PO TABS: 50.0000 ug | ORAL_TABLET | Freq: Every day | ORAL | 3 refills | Status: DC

## 2015-10-17 MED ORDER — TRIAMTERENE-HCTZ 75-50 MG PO TABS: 1.0000 | ORAL_TABLET | Freq: Every day | ORAL | 3 refills | Status: DC

## 2015-10-17 NOTE — Addendum Note (Signed)
Addended by: Elio Forget on: 10/17/2015 09:48 AM   Modules accepted: Orders

## 2015-10-17 NOTE — Progress Notes (Signed)
Subjective:     Patient ID: Stacy Wagner, female   DOB: 05/25/1940, 75 y.o.   MRN: [id number]  HPI Patient here for Medicare subsequent annual wellness visit and medical follow-up.  Her chronic problems include history of hypothyroidism, obesity, hypertension, hyperlipidemia, GERD. She remains on low-dose estrogen and has been reluctant to discontinue. She has hypertension which has been well controlled with Maxzide. She has history of hyperlipidemia which she has been able to manage with diet. She has unfortunately gained some weight since last visit. Poor compliance with diet. Takes levothyroxine for hypothyroidism. Compliant with medications. No side effects.  Bone density scan few years ago. No known history of abnormality. She has lost over one inch in height. She is scheduled for mammogram in August. She's had previous hysterectomy for benign disease. She had previous adenomatous polyps and is overdue for repeat colonoscopy. She refuses flu vaccine. Other immunizations up-to-date.  Past Medical History:  Diagnosis Date  . AC (acromioclavicular) joint bone spurs   . Bursitis disorder    bil shoulders  . GERD (gastroesophageal reflux disease)   . Hearing loss    bil / no hearing aids  . Hypertension   . Hypothyroidism   . OA (osteoarthritis)   . Prolapse of female bladder, acquired   . Thyroid disease    Past Surgical History:  Procedure Laterality Date  . ABDOMINAL HYSTERECTOMY     uterine prolapse  . APPENDECTOMY    . BREAST SURGERY    . carpat tunnel  release     right hand  . COSMETIC SURGERY     tummy tuck /remove excess skin arms and legs  . HAND TENDON SURGERY     right hand  . SPINE SURGERY    . TONSILLECTOMY      reports that she has never smoked. She has never used smokeless tobacco. She reports that she does not drink alcohol or use drugs. family history includes Cancer in her mother; Diabetes in her father; Hypertension in her sister. Allergies  Allergen  Reactions  . Codeine     nausea   1.  Risk factors based on Past Medical , Social, and Family history reviewed and as indicated above with no changes 2.  Limitations in physical activities None.  No recent falls. 3.  Depression/mood No active depression or anxiety issues 4.  Hearing chronic hearing loss with bilateral hearing aids. Stable 5.  ADLs independent in all. Still works part-time about 15 hours per week 6.  Cognitive function (orientation to time and place, language, writing, speech,memory) no short or long term memory issues.  Language and judgement intact. 7.  Home Safety no issues 8.  Height, weight, and visual acuity. recent weight gain. She attributes to poor dietary compliance 9.  Counseling discussed encouraged weight loss. Daily calcium and vitamin D. 10. Recommendation of preventive services. Consider repeat colonoscopy. Schedule DEXA scan. Mammogram scheduled. Recommended flu vaccine and she declines. 11. Labs based on risk factors lipid, hepatic, TSH, basic metabolic panel 12. Care Plan as above 13. Other Providers-none 14. Written schedule of screening/prevention services given to patient.    Review of Systems  Constitutional: Negative for activity change, appetite change, fatigue, fever and unexpected weight change.  HENT: Negative for ear pain, hearing loss, sore throat and trouble swallowing.   Eyes: Negative for visual disturbance.  Respiratory: Negative for cough and shortness of breath.   Cardiovascular: Negative for chest pain and palpitations.  Gastrointestinal: Negative for abdominal pain, blood in stool,  constipation and diarrhea.  Endocrine: Negative for polydipsia and polyuria.  Genitourinary: Negative for dysuria and hematuria.  Musculoskeletal: Positive for arthralgias and back pain. Negative for myalgias.  Skin: Negative for rash.  Neurological: Negative for dizziness, syncope and headaches.  Hematological: Negative for adenopathy.   Psychiatric/Behavioral: Negative for confusion and dysphoric mood.       Objective:   Physical Exam  Constitutional: She is oriented to person, place, and time. She appears well-developed and well-nourished.  HENT:  Right Ear: External ear normal.  Left Ear: External ear normal.  Mouth/Throat: Oropharynx is clear and moist.  Neck: Neck supple. No thyromegaly present.  Cardiovascular: Normal rate.   Pulmonary/Chest: Effort normal and breath sounds normal. No respiratory distress. She has no wheezes. She has no rales.  Abdominal: Soft. She exhibits no mass. There is no tenderness. There is no rebound and no guarding.  Musculoskeletal: She exhibits no edema.  Lymphadenopathy:    She has no cervical adenopathy.  Neurological: She is alert and oriented to person, place, and time.  Skin: No rash noted.  Psychiatric: She has a normal mood and affect. Her behavior is normal.       Assessment:     #1 Medicare subsequent annual wellness visit-offered flu vaccine and patient declines. She needs to schedule follow-up colonoscopy. She has mammogram scheduled.   #2 hypothyroidism  #3 hypertension stable and at goal  #4 history of hyperlipidemia    Plan:     -Flu vaccine offered and declined -Other immunizations up-to-date -Mammogram already scheduled for August 25 -Written prescription for bone density scan -Labs with TSH, basic metabolic panel, hepatic, lipid -She is encouraged to lose some weight  Stacy Post MD [facility] at [facility]

## 2015-10-17 NOTE — Progress Notes (Signed)
Pre visit review using our clinic review tool, if applicable. No additional management support is needed unless otherwise documented below in the visit note. 

## 2015-10-17 NOTE — Patient Instructions (Signed)
Set up repeat colonoscopy.  Health Maintenance  Topic Date Due  . DEXA SCAN  10/24/2007  . COLONOSCOPY  11/27/2014  . MAMMOGRAM  09/22/2015  . INFLUENZA VACCINE  11/08/2015 (Originally 09/27/2015)  . TETANUS/TDAP  08/26/2018  . ZOSTAVAX  Completed  . PNA vac Low Risk Adult  Completed

## 2015-10-21 ENCOUNTER — Encounter: Payer: Self-pay | Admitting: Family Medicine

## 2015-10-21 DIAGNOSIS — Z780 Asymptomatic menopausal state: Secondary | ICD-10-CM | POA: Diagnosis not present

## 2015-10-21 DIAGNOSIS — Z1231 Encounter for screening mammogram for malignant neoplasm of breast: Secondary | ICD-10-CM | POA: Diagnosis not present

## 2015-10-21 LAB — HM MAMMOGRAPHY: HM Mammogram: NORMAL (ref 0–4)

## 2015-10-25 ENCOUNTER — Telehealth: Payer: Self-pay | Admitting: Family Medicine

## 2015-10-25 NOTE — Telephone Encounter (Signed)
[employer] called to inform of denial for  cyclobenzaprine (FLEXERIL) 10 MG tablet  This is a non FDA approved use.  Letter will be mailed out.

## 2015-10-25 NOTE — Telephone Encounter (Signed)
Spoke with husband who said she would be home in an hour so I stated would call patient back.

## 2015-10-25 NOTE — Telephone Encounter (Signed)
She was apparently placed on that years ago by Dr Joni Fears.  Let her know.  If she would like she can follow up to discuss alternatives.

## 2015-10-26 NOTE — Telephone Encounter (Signed)
Called and left message for pt to return call to office.  

## 2015-10-27 NOTE — Telephone Encounter (Signed)
Spoke with patient about [employer]'s denial of Flexeril. She stated she knew and will just pay for it out of pocket since she doesn't need it that often.

## 2015-11-03 ENCOUNTER — Encounter: Payer: Self-pay | Admitting: Family Medicine

## 2015-11-18 DIAGNOSIS — M7542 Impingement syndrome of left shoulder: Secondary | ICD-10-CM | POA: Diagnosis not present

## 2015-11-18 DIAGNOSIS — M25512 Pain in left shoulder: Secondary | ICD-10-CM | POA: Diagnosis not present

## 2015-12-22 DIAGNOSIS — H02834 Dermatochalasis of left upper eyelid: Secondary | ICD-10-CM | POA: Diagnosis not present

## 2015-12-22 DIAGNOSIS — H02831 Dermatochalasis of right upper eyelid: Secondary | ICD-10-CM | POA: Diagnosis not present

## 2015-12-22 DIAGNOSIS — H524 Presbyopia: Secondary | ICD-10-CM | POA: Diagnosis not present

## 2015-12-22 DIAGNOSIS — H02832 Dermatochalasis of right lower eyelid: Secondary | ICD-10-CM | POA: Diagnosis not present

## 2015-12-22 DIAGNOSIS — H02835 Dermatochalasis of left lower eyelid: Secondary | ICD-10-CM | POA: Diagnosis not present

## 2015-12-22 DIAGNOSIS — H25813 Combined forms of age-related cataract, bilateral: Secondary | ICD-10-CM | POA: Diagnosis not present

## 2016-02-03 ENCOUNTER — Other Ambulatory Visit: Payer: Self-pay

## 2016-04-26 ENCOUNTER — Other Ambulatory Visit: Payer: Self-pay | Admitting: Family Medicine

## 2016-04-28 ENCOUNTER — Other Ambulatory Visit: Payer: Self-pay | Admitting: Family Medicine

## 2016-04-30 ENCOUNTER — Telehealth: Payer: Self-pay | Admitting: Family Medicine

## 2016-04-30 MED ORDER — EST ESTROGENS-METHYLTEST 1.25-2.5 MG PO TABS: 1.0000 | ORAL_TABLET | Freq: Every day | ORAL | 5 refills | Status: DC

## 2016-04-30 NOTE — Telephone Encounter (Signed)
Pt need new Rx for estrogens-methylTEST  Pt state that she only has 2 pills left and would like to pick the scrip up today if possible.

## 2016-04-30 NOTE — Telephone Encounter (Signed)
Rx sent 

## 2016-05-02 MED ORDER — EST ESTROGENS-METHYLTEST 1.25-2.5 MG PO TABS: 1.0000 | ORAL_TABLET | Freq: Every day | ORAL | 5 refills | Status: DC

## 2016-05-02 NOTE — Addendum Note (Signed)
Addended by: Westley Hummer B on: 05/02/2016 01:04 PM   Modules accepted: Orders

## 2016-05-02 NOTE — Telephone Encounter (Signed)
Refill sent.

## 2016-05-02 NOTE — Telephone Encounter (Signed)
rx needs to go to [facility]

## 2016-06-18 DIAGNOSIS — L570 Actinic keratosis: Secondary | ICD-10-CM | POA: Diagnosis not present

## 2016-06-18 DIAGNOSIS — L430 Hypertrophic lichen planus: Secondary | ICD-10-CM | POA: Diagnosis not present

## 2016-06-18 DIAGNOSIS — L814 Other melanin hyperpigmentation: Secondary | ICD-10-CM | POA: Diagnosis not present

## 2016-06-18 DIAGNOSIS — L820 Inflamed seborrheic keratosis: Secondary | ICD-10-CM | POA: Diagnosis not present

## 2016-06-18 DIAGNOSIS — D485 Neoplasm of uncertain behavior of skin: Secondary | ICD-10-CM | POA: Diagnosis not present

## 2016-06-18 DIAGNOSIS — L821 Other seborrheic keratosis: Secondary | ICD-10-CM | POA: Diagnosis not present

## 2016-06-18 DIAGNOSIS — L309 Dermatitis, unspecified: Secondary | ICD-10-CM | POA: Diagnosis not present

## 2016-06-21 DIAGNOSIS — H25813 Combined forms of age-related cataract, bilateral: Secondary | ICD-10-CM | POA: Diagnosis not present

## 2016-06-21 DIAGNOSIS — H02831 Dermatochalasis of right upper eyelid: Secondary | ICD-10-CM | POA: Diagnosis not present

## 2016-06-21 DIAGNOSIS — H02832 Dermatochalasis of right lower eyelid: Secondary | ICD-10-CM | POA: Diagnosis not present

## 2016-06-21 DIAGNOSIS — H524 Presbyopia: Secondary | ICD-10-CM | POA: Diagnosis not present

## 2016-06-21 DIAGNOSIS — H02834 Dermatochalasis of left upper eyelid: Secondary | ICD-10-CM | POA: Diagnosis not present

## 2016-10-17 ENCOUNTER — Other Ambulatory Visit: Payer: Self-pay | Admitting: Family Medicine

## 2016-10-17 ENCOUNTER — Ambulatory Visit (INDEPENDENT_AMBULATORY_CARE_PROVIDER_SITE_OTHER): Payer: Medicare Other | Admitting: Family Medicine

## 2016-10-17 ENCOUNTER — Encounter: Payer: Self-pay | Admitting: Family Medicine

## 2016-10-17 VITALS — BP 160/100 | HR 110 | Temp 98.2°F | Ht 67.5 in | Wt 206.2 lb

## 2016-10-17 DIAGNOSIS — K219 Gastro-esophageal reflux disease without esophagitis: Secondary | ICD-10-CM | POA: Diagnosis not present

## 2016-10-17 DIAGNOSIS — I10 Essential (primary) hypertension: Secondary | ICD-10-CM | POA: Diagnosis not present

## 2016-10-17 DIAGNOSIS — E039 Hypothyroidism, unspecified: Secondary | ICD-10-CM

## 2016-10-17 DIAGNOSIS — E785 Hyperlipidemia, unspecified: Secondary | ICD-10-CM

## 2016-10-17 LAB — HEPATIC FUNCTION PANEL
ALK PHOS: 79 U/L (ref 39–117)
ALT: 19 U/L (ref 0–35)
AST: 23 U/L (ref 0–37)
Albumin: 3.9 g/dL (ref 3.5–5.2)
BILIRUBIN DIRECT: 0.1 mg/dL (ref 0.0–0.3)
Total Bilirubin: 0.4 mg/dL (ref 0.2–1.2)
Total Protein: 6.9 g/dL (ref 6.0–8.3)

## 2016-10-17 LAB — BASIC METABOLIC PANEL
BUN: 18 mg/dL (ref 6–23)
CALCIUM: 9.2 mg/dL (ref 8.4–10.5)
CO2: 27 meq/L (ref 19–32)
Chloride: 104 mEq/L (ref 96–112)
Creatinine, Ser: 0.9 mg/dL (ref 0.40–1.20)
GFR: 65.06 mL/min (ref >60.00)
GLUCOSE: 108 mg/dL — AB (ref 70–99)
Potassium: 3.5 mEq/L (ref 3.5–5.1)
SODIUM: 139 meq/L (ref 135–145)

## 2016-10-17 LAB — LIPID PANEL
CHOL/HDL RATIO: 5
Cholesterol: 200 mg/dL (ref 0–200)
HDL: 37 mg/dL — AB (ref >39.00)
LDL Cholesterol: 130 mg/dL — ABNORMAL HIGH (ref 0–99)
NONHDL: 163.23
Triglycerides: 166 mg/dL — ABNORMAL HIGH (ref 0.0–149.0)
VLDL: 33.2 mg/dL (ref 0.0–40.0)

## 2016-10-17 LAB — TSH: TSH: 6.3 u[IU]/mL — AB (ref 0.35–4.50)

## 2016-10-17 MED ORDER — LEVOTHYROXINE SODIUM 50 MCG PO TABS: 50.0000 ug | ORAL_TABLET | Freq: Every day | ORAL | 3 refills | Status: DC

## 2016-10-17 MED ORDER — EST ESTROGENS-METHYLTEST 1.25-2.5 MG PO TABS: 1.0000 | ORAL_TABLET | Freq: Every day | ORAL | 5 refills | Status: DC

## 2016-10-17 MED ORDER — LEVOTHYROXINE SODIUM 75 MCG PO TABS: 75.0000 ug | ORAL_TABLET | Freq: Every day | ORAL | 1 refills | Status: DC

## 2016-10-17 MED ORDER — TRIAMTERENE-HCTZ 75-50 MG PO TABS: 1.0000 | ORAL_TABLET | Freq: Every day | ORAL | 3 refills | Status: DC

## 2016-10-17 NOTE — Progress Notes (Signed)
Subjective:     Patient ID: Stacy Wagner, female   DOB: [birth year], 76 y.o.   MRN: [id number]  HPI Patient here for medical follow-up. She unfortunately got a speeding ticket on the way here this morning and blood pressures up likely related to that. She feels quite anxious at this time. She has hypertension which is generally been well controlled Maxide. She has had some weight gain this past year. Her other medical problems include history of hypothyroidism, hyperlipidemia, osteoarthritis, GERD. She's been on Prilosec for several years. She's not tried transitioning to H2-blockers.  No recent chest pains. Nonsmoker. No alcohol use.  She has mammogram set up. She is overdue for repeat colonoscopy in hopes to set that up this fall.  Past Medical History:  Diagnosis Date  . AC (acromioclavicular) joint bone spurs   . Bursitis disorder    bil shoulders  . GERD (gastroesophageal reflux disease)   . Hearing loss    bil / no hearing aids  . Hypertension   . Hypothyroidism   . OA (osteoarthritis)   . Prolapse of female bladder, acquired   . Thyroid disease    Past Surgical History:  Procedure Laterality Date  . ABDOMINAL HYSTERECTOMY     uterine prolapse  . APPENDECTOMY    . BREAST SURGERY    . carpat tunnel  release     right hand  . COSMETIC SURGERY     tummy tuck /remove excess skin arms and legs  . HAND TENDON SURGERY     right hand  . SPINE SURGERY    . TONSILLECTOMY      reports that she has never smoked. She has never used smokeless tobacco. She reports that she does not drink alcohol or use drugs. family history includes Cancer in her mother; Diabetes in her father; Hypertension in her sister. Allergies  Allergen Reactions  . Codeine     nausea     Review of Systems  Constitutional: Negative for fatigue and unexpected weight change.  Eyes: Negative for visual disturbance.  Respiratory: Negative for cough, chest tightness, shortness of breath and wheezing.    Cardiovascular: Negative for chest pain, palpitations and leg swelling.  Gastrointestinal: Negative for abdominal pain.  Endocrine: Negative for polydipsia and polyuria.  Musculoskeletal: Positive for arthralgias.  Neurological: Negative for dizziness, seizures, syncope, weakness, light-headedness and headaches.       Objective:   Physical Exam  Constitutional: She appears well-developed and well-nourished.  Eyes: Pupils are equal, round, and reactive to light.  Neck: Neck supple. No JVD present. No thyromegaly present.  Cardiovascular: Normal rate and regular rhythm.  Exam reveals no gallop.   Pulmonary/Chest: Effort normal and breath sounds normal. No respiratory distress. She has no wheezes. She has no rales.  Musculoskeletal: She exhibits no edema.  Neurological: She is alert.       Assessment:     #1 hypertension. Elevated reading today likely related to acute stress of her speeding ticket  #2 hypothyroidism  #3 hyperlipidemia  #4 GERD currently well controlled    Plan:     -She is strongly advised to lose some weight -Monitor blood pressure more closely at home and schedule follow-up within 2 months to reassess blood pressure here -Refilled regular medications for one year -Obtain labs with TSH, basic metabolic panel, lipid panel, hepatic panel -set up repeat colonoscopy -She is already scheduled for repeat mammogram -Encouraged to set up Medicare subsequent annual wellness visit  Eulas Post MD [facility]

## 2016-10-17 NOTE — Patient Instructions (Addendum)
Try to lose some weight Get repeat mammogram Consider trying to transition from Prilosec to Zantac or Pepcid Monitor blood pressure at home and be in touch if consistently > 140/90  Set up Medicare Annual Wellness Visit. Set up repeat Colonoscopy

## 2016-10-31 DIAGNOSIS — Z1231 Encounter for screening mammogram for malignant neoplasm of breast: Secondary | ICD-10-CM | POA: Diagnosis not present

## 2016-10-31 LAB — HM MAMMOGRAPHY

## 2016-11-02 ENCOUNTER — Encounter: Payer: Self-pay | Admitting: Family Medicine

## 2016-11-09 ENCOUNTER — Ambulatory Visit: Payer: Medicare Other

## 2016-11-12 NOTE — Progress Notes (Addendum)
Subjective:   Stacy Wagner is a 76 y.o. female who presents for Medicare Annual (Subsequent) preventive examination.  The Patient was informed that the wellness visit is to identify future health risk and educate and initiate measures that can reduce risk for increased disease through the lifespan.    Annual Wellness Assessment  Reports health as fair  Preventive Screening -Counseling & Management  Medicare Annual Preventive Care Visit - Subsequent Last OV 08/22;   Health Maintenance Due  Topic Date Due  . COLONOSCOPY  11/27/2014    Last one 11/2011; agrees to fup and get this taken care of Mammogram 10/2016 Dexa; 09/2015 -0.30 (taking calcium)  Pap 2007   VS reviewed;   Diet  76 yo weight 285 Last 2 years put on 30 - 40lbs Success in the past; Dr. 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 and she had to work afternoon and ended up having to leave  [employer] the job - grieving some  Tearful today - allowed to vent feelings  States she is fine Has bible study she can go to now The wound has not healed as yet  Encouraged counseling if she does not feel better soon   BMI 31.8  Exercise Has a bad back; can't stand for a long period of time  Has a bad knee on the right  When she worked; she would walk up and down steps  On hold to "get her back fixed" has lower back pain  Hearing Screening Comments: Has hearing aids  Went to the hearing center  Vision Screening Comments: Vision - has glasses Has cataracts but they are not large enough for surgery    Stressors: recent loss of job  Sleep patterns: takes otc meds but does sleep   Pain - back  1-10  Hurts 10 if she is on her feet a lot  Stops when she sits States it starting to go down her right leg      Cardiac Risk Factors Addressed Hyperlipidemia - chol 200; HDL 37; LDL 130; trig 166        Objective:     Vitals: BP (!) 160/70   Pulse 80   Ht 5\' 8"  (1.727 m)   Wt 209 lb (94.8 kg)   SpO2 98%   BMI 31.78 kg/m   Body mass index is 31.78 kg/m.   Tobacco History  Smoking Status  . Never Smoker  Smokeless Tobacco  . Never Used     Counseling given: Yes   Past Medical History:  Diagnosis Date  . AC (acromioclavicular) joint bone spurs   . Bursitis disorder    bil shoulders  . GERD (gastroesophageal reflux disease)   . Hearing loss    bil / no hearing aids  . Hypertension   . Hypothyroidism   . OA (osteoarthritis)   . Prolapse of female bladder, acquired   . Thyroid disease    Past Surgical History:  Procedure Laterality Date  . ABDOMINAL HYSTERECTOMY     uterine prolapse  . APPENDECTOMY    . BREAST SURGERY    . carpat  tunnel  release     right hand  . COSMETIC SURGERY     tummy tuck /remove excess skin arms and legs  . HAND TENDON SURGERY     right hand  . SPINE SURGERY    . TONSILLECTOMY     Family History  Problem Relation Age of Onset  . Cancer Mother        ?lung cancer  . Hypertension Sister   . Diabetes Father    History  Sexual Activity  . Sexual activity: Yes  . Partners: Male    Outpatient Encounter Prescriptions as of 11/13/2016  Medication Sig  . Ascorbic Acid (VITAMIN C PO) Take by mouth daily.    Marland Kitchen b complex vitamins tablet Take 1 tablet by mouth daily.  . Calcium 600-200 MG-UNIT per tablet Take 1 tablet by mouth daily.    . Calcium Carbonate (CALTRATE 600 PO) Take by mouth daily.    . clotrimazole-betamethasone (LOTRISONE) cream Apply topically 2 (two) times daily.  . Cyanocobalamin (VITAMIN B 12 PO) Take by mouth daily.    . cyclobenzaprine (FLEXERIL) 10 MG tablet Take 1 tablet (10 mg total) by mouth 3 (three)  times daily as needed.  Marland Kitchen estrogens-methylTEST (ESTRATEST) 1.25-2.5 MG tablet Take 1 tablet by mouth daily.  . Glucosamine-Chondroitin (OSTEO BI-FLEX REGULAR STRENGTH PO) Take 1 tablet by mouth daily.  Marland Kitchen levothyroxine (SYNTHROID, LEVOTHROID) 75 MCG tablet Take 1 tablet (75 mcg total) by mouth daily.  . multivitamin (THERAGRAN) per tablet Take 1 tablet by mouth daily. Centrum MVI -Take one daily  . mupirocin ointment (BACTROBAN) 2 % APPLY TO AFFECTED AREA TWICE PER DAY  . omeprazole (PRILOSEC) 20 MG capsule Take 20 mg by mouth daily.  Marland Kitchen triamterene-hydrochlorothiazide (MAXZIDE) 75-50 MG tablet Take 1 tablet by mouth daily.   No facility-administered encounter medications on file as of 11/13/2016.     Activities of Daily Living In your present state of health, do you have any difficulty performing the following activities: 11/13/2016  Hearing? Y  Vision? N  Some recent data might be hidden    Patient Care Team: Eulas Post, MD as PCP - General (Family Medicine)    Assessment:     Exercise Activities and Dietary recommendations    Goals    . Weight (lb) < 190 lb (86.2 kg)          Will determine the best path to wait loss.  Life is good and enjoy the journey            Fall Risk Fall Risk  11/13/2016 02/03/2016 10/15/2014 10/15/2014 10/08/2013  Falls in the past year? No No No No No  Comment - Emmi Telephone Survey: data to providers prior to load - - -   Depression Screen PHQ 2/9 Scores 11/13/2016 10/17/2016 10/31/2014 10/15/2014  PHQ - 2 Score 0 0 0 0     Cognitive Function   Ad8 score reviewed for issues:  Issues making decisions:  Less interest in hobbies / activities:  Repeats questions, stories (family complaining):  Trouble using ordinary gadgets (microwave, computer, phone):  Forgets the month or year:   Mismanaging finances:   Remembering appts:  Daily problems with thinking and/or memory: Ad8 score is=0       6CIT Screen 11/13/2016  What  Year? 0 points  What month? 0 points  What time? 0 points  Count back from 20 0 points  Months in reverse 0 points  Repeat phrase 0 points  Total Score 0  Immunization History  Administered Date(s) Administered  . Pneumococcal Conjugate-13 10/08/2013  . Pneumococcal Polysaccharide-23 08/25/2008  . Td 08/25/2008  . Zoster 09/27/2010   Screening Tests Health Maintenance  Topic Date Due  . COLONOSCOPY  11/27/2014  . INFLUENZA VACCINE  10/17/2017 (Originally 09/26/2016)  . TETANUS/TDAP  08/26/2018  . MAMMOGRAM  11/01/2018  . DEXA SCAN  Completed  . PNA vac Low Risk Adult  Completed      Plan:    PCP Notes   Health Maintenance Colonoscopy due 11/2014 and she agreed to call and schedule   Abnormal Screens  BP elevated; she will buy her a new cuff this week  Feels like this is weight gain;  Discussed seeing Dr. Elease Hashimoto if it does not go down in 2 weeks  Referrals  Discussed back pain and some pain down her right leg if she stands to long; declined apt with Dr. Elease Hashimoto but agreed to make an apt with the same practice that did her back surgery   Patient concerns; Is centered on her weight, but has not made any deliberate plans to lose weight. 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   Nurse Concerns; BP 160 /70 / feels like this is due to weight gain and stress  Next PCP apt 12/17/2016 and her will recheck your BP  She will bring her readings with her when seeing Dr. Elease Hashimoto (bP was taken at the end of her session today so a second BP was not obtained. )        I have personally reviewed and noted the following in the patient's chart:   . Medical and social history . Use of alcohol, tobacco or illicit drugs  . Current medications and supplements . Functional ability and status . Nutritional status . Physical activity . Advanced directives . List of other  physicians . Hospitalizations, surgeries, and ER visits in previous 12 months . Vitals . Screenings to include cognitive, depression, and falls . Referrals and appointments  In addition, I have reviewed and discussed with patient certain preventive protocols, quality metrics, and best practice recommendations. A written personalized care plan for preventive services as well as general preventive health recommendations were provided to patient.     VFIEP,PIRJJ, RN  11/13/2016  Agree with assessment as above.  We do need to reassess BP if remains elevated.  Eulas Post MD [facility]

## 2016-11-13 ENCOUNTER — Telehealth: Payer: Self-pay

## 2016-11-13 ENCOUNTER — Ambulatory Visit (INDEPENDENT_AMBULATORY_CARE_PROVIDER_SITE_OTHER): Payer: Medicare Other

## 2016-11-13 VITALS — BP 160/70 | HR 80 | Ht 68.0 in | Wt 209.0 lb

## 2016-11-13 DIAGNOSIS — Z0000 Encounter for general adult medical examination without abnormal findings: Secondary | ICD-10-CM | POA: Diagnosis not present

## 2016-11-13 NOTE — Patient Instructions (Addendum)
Stacy Wagner , Thank you for taking time to come for your Medicare Wellness Visit. I appreciate your ongoing commitment to your health goals. Please review the following plan we discussed and let me know if I can assist you in the future.   Plans to make apt with a doctor for her back issues/pain going down your leg indicates you need to see someone   Taking care of this may help with future exercise  and enjoying your life   Will set up a time for colonoscopy with your GI doctor   Deaf & [facility] of Hearing [facility] Services - can assist with hearing aid x 1  No reviews  [facility]  [address]  [phone]  (discussed BP and has apt 10/22 with Dr. Elease Hashimoto   These are the goals we discussed: Goals    . Weight (lb) < 190 lb (86.2 kg)          Will determine the best path to wait loss.  Life is good and enjoy the journey             This is a list of the screening recommended for you and due dates:  Health Maintenance  Topic Date Due  . Colon Cancer Screening  11/27/2014  . Flu Shot  10/17/2017*  . Tetanus Vaccine  08/26/2018  . Mammogram  11/01/2018  . DEXA scan (bone density measurement)  Completed  . Pneumonia vaccines  Completed  *Topic was postponed. The date shown is not the original due date.    Prevention of falls: Remove rugs or any tripping hazards in the home Use Non slip mats in bathtubs and showers Placing grab bars next to the toilet and or shower Placing handrails on both sides of the stair way Adding extra lighting in the home.   Personal safety issues reviewed:  1. Consider starting a community watch program per [facility] 2.  Changes batteries is smoke detector and/or carbon monoxide detector  3.  If you have firearms; keep them in a safe place 4.  Wear protection when in the sun; Always wear sunscreen or a hat; It is good to have your doctor check your skin annually or review any new areas of concern 5. Driving  safety; Keep in the right lane; stay 3 car lengths behind the car in front of you on the highway; look 3 times prior to pulling out; carry your cell phone everywhere you go!    Learn about the Yellow Dot program:  The program allows first responders at your emergency to have access to who your physician is, as well as your medications and medical conditions.  Citizens requesting the Yellow Dot Packages should contact Master Corporal Nunzio Cobbs at the [facility] [phone] for the first week of the program and beginning the week after Easter citizens should contact their Scientist, physiological.    Fall Prevention in the Home Falls can cause injuries and can affect people from all age groups. There are many simple things that you can do to make your home safe and to help prevent falls. What can I do on the outside of my home?  Regularly repair the edges of walkways and driveways and fix any cracks.  Remove high doorway thresholds.  Trim any shrubbery on the main path into your home.  Use bright outdoor lighting.  Clear walkways of debris and clutter, including tools and rocks.  Regularly check that handrails  are securely fastened and in good repair. Both sides of any steps should have handrails.  Install guardrails along the edges of any raised decks or porches.  Have leaves, snow, and ice cleared regularly.  Use sand or salt on walkways during winter months.  In the garage, clean up any spills right away, including grease or oil spills. What can I do in the bathroom?  Use night lights.  Install grab bars by the toilet and in the tub and shower. Do not use towel bars as grab bars.  Use non-skid mats or decals on the floor of the tub or shower.  If you need to sit down while you are in the shower, use a plastic, non-slip stool.  Keep the floor dry. Immediately clean up any water that spills on the floor.  Remove soap buildup in the tub or  shower on a regular basis.  Attach bath mats securely with double-sided non-slip rug tape.  Remove throw rugs and other tripping hazards from the floor. What can I do in the bedroom?  Use night lights.  Make sure that a bedside light is easy to reach.  Do not use oversized bedding that drapes onto the floor.  Have a firm chair that has side arms to use for getting dressed.  Remove throw rugs and other tripping hazards from the floor. What can I do in the kitchen?  Clean up any spills right away.  Avoid walking on wet floors.  Place frequently used items in easy-to-reach places.  If you need to reach for something above you, use a sturdy step stool that has a grab bar.  Keep electrical cables out of the way.  Do not use floor polish or wax that makes floors slippery. If you have to use wax, make sure that it is non-skid floor wax.  Remove throw rugs and other tripping hazards from the floor. What can I do in the stairways?  Do not leave any items on the stairs.  Make sure that there are handrails on both sides of the stairs. Fix handrails that are broken or loose. Make sure that handrails are as long as the stairways.  Check any carpeting to make sure that it is firmly attached to the stairs. Fix any carpet that is loose or worn.  Avoid having throw rugs at the top or bottom of stairways, or secure the rugs with carpet tape to prevent them from moving.  Make sure that you have a light switch at the top of the stairs and the bottom of the stairs. If you do not have them, have them installed. What are some other fall prevention tips?  Wear closed-toe shoes that fit well and support your feet. Wear shoes that have rubber soles or low heels.  When you use a stepladder, make sure that it is completely opened and that the sides are firmly locked. Have someone hold the ladder while you are using it. Do not climb a closed stepladder.  Add color or contrast paint or tape to grab  bars and handrails in your home. Place contrasting color strips on the first and last steps.  Use mobility aids as needed, such as canes, walkers, scooters, and crutches.  Turn on lights if it is dark. Replace any light bulbs that burn out.  Set up furniture so that there are clear paths. Keep the furniture in the same spot.  Fix any uneven floor surfaces.  Choose a carpet design that does not hide the edge  of steps of a stairway.  Be aware of any and all pets.  Review your medicines with your healthcare provider. Some medicines can cause dizziness or changes in blood pressure, which increase your risk of falling. Talk with your health care provider about other ways that you can decrease your risk of falls. This may include working with a physical therapist or trainer to improve your strength, balance, and endurance. This information is not intended to replace advice given to you by your health care provider. Make sure you discuss any questions you have with your health care provider. Document Released: 02/02/2002 Document Revised: 07/12/2015 Document Reviewed: 03/19/2014 Elsevier Interactive Patient Education  2017 [place] Maintenance, Female Adopting a healthy lifestyle and getting preventive care can go a long way to promote health and wellness. Talk with your health care provider about what schedule of regular examinations is right for you. This is a good chance for you to check in with your provider about disease prevention and staying healthy. In between checkups, there are plenty of things you can do on your own. Experts have done a lot of research about which lifestyle changes and preventive measures are most likely to keep you healthy. Ask your health care provider for more information. Weight and diet Eat a healthy diet  Be sure to include plenty of vegetables, fruits, low-fat dairy products, and lean protein.  Do not eat a lot of foods high in solid fats, added  sugars, or salt.  Get regular exercise. This is one of the most important things you can do for your health. ? Most adults should exercise for at least 150 minutes each week. The exercise should increase your heart rate and make you sweat (moderate-intensity exercise). ? Most adults should also do strengthening exercises at least twice a week. This is in addition to the moderate-intensity exercise.  Maintain a healthy weight  Body mass index (BMI) is a measurement that can be used to identify possible weight problems. It estimates body fat based on height and weight. Your health care provider can help determine your BMI and help you achieve or maintain a healthy weight.  For females 72 years of age and older: ? A BMI below 18.5 is considered underweight. ? A BMI of 18.5 to 24.9 is normal. ? A BMI of 25 to 29.9 is considered overweight. ? A BMI of 30 and above is considered obese.  Watch levels of cholesterol and blood lipids  You should start having your blood tested for lipids and cholesterol at 74 years of age, then have this test every 5 years.  You may need to have your cholesterol levels checked more often if: ? Your lipid or cholesterol levels are high. ? You are older than 74 years of age. ? You are at high risk for heart disease.  Cancer screening Lung Cancer  Lung cancer screening is recommended for adults 55-100 years old who are at high risk for lung cancer because of a history of smoking.  A yearly low-dose CT scan of the lungs is recommended for people who: ? Currently smoke. ? Have quit within the past 15 years. ? Have at least a 30-pack-year history of smoking. A pack year is smoking an average of one pack of cigarettes a day for 1 year.  Yearly screening should continue until it has been 15 years since you quit.  Yearly screening should stop if you develop a health problem that would prevent you from having lung cancer  treatment.  Breast Cancer  Practice breast  self-awareness. This means understanding how your breasts normally appear and feel.  It also means doing regular breast self-exams. Let your health care provider know about any changes, no matter how small.  If you are in your 20s or 30s, you should have a clinical breast exam (CBE) by a health care provider every 1-3 years as part of a regular health exam.  If you are 42 or older, have a CBE every year. Also consider having a breast X-ray (mammogram) every year.  If you have a family history of breast cancer, talk to your health care provider about genetic screening.  If you are at high risk for breast cancer, talk to your health care provider about having an MRI and a mammogram every year.  Breast cancer gene (BRCA) assessment is recommended for women who have family members with BRCA-related cancers. BRCA-related cancers include: ? Breast. ? Ovarian. ? Tubal. ? Peritoneal cancers.  Results of the assessment will determine the need for genetic counseling and BRCA1 and BRCA2 testing.  Cervical Cancer Your health care provider may recommend that you be screened regularly for cancer of the pelvic organs (ovaries, uterus, and vagina). This screening involves a pelvic examination, including checking for microscopic changes to the surface of your cervix (Pap test). You may be encouraged to have this screening done every 3 years, beginning at age 71.  For women ages 46-65, health care providers may recommend pelvic exams and Pap testing every 3 years, or they may recommend the Pap and pelvic exam, combined with testing for human papilloma virus (HPV), every 5 years. Some types of HPV increase your risk of cervical cancer. Testing for HPV may also be done on women of any age with unclear Pap test results.  Other health care providers may not recommend any screening for nonpregnant women who are considered low risk for pelvic cancer and who do not have symptoms. Ask your health care provider if a  screening pelvic exam is right for you.  If you have had past treatment for cervical cancer or a condition that could lead to cancer, you need Pap tests and screening for cancer for at least 20 years after your treatment. If Pap tests have been discontinued, your risk factors (such as having a new sexual partner) need to be reassessed to determine if screening should resume. Some women have medical problems that increase the chance of getting cervical cancer. In these cases, your health care provider may recommend more frequent screening and Pap tests.  Colorectal Cancer  This type of cancer can be detected and often prevented.  Routine colorectal cancer screening usually begins at 74 years of age and continues through 74 years of age.  Your health care provider may recommend screening at an earlier age if you have risk factors for colon cancer.  Your health care provider may also recommend using home test kits to check for hidden blood in the stool.  A small camera at the end of a tube can be used to examine your colon directly (sigmoidoscopy or colonoscopy). This is done to check for the earliest forms of colorectal cancer.  Routine screening usually begins at age 40.  Direct examination of the colon should be repeated every 5-10 years through 74 years of age. However, you may need to be screened more often if early forms of precancerous polyps or small growths are found.  Skin Cancer  Check your skin from head to toe regularly.  Tell your health care provider about any new moles or changes in moles, especially if there is a change in a mole's shape or color.  Also tell your health care provider if you have a mole that is larger than the size of a pencil eraser.  Always use sunscreen. Apply sunscreen liberally and repeatedly throughout the day.  Protect yourself by wearing long sleeves, pants, a wide-brimmed hat, and sunglasses whenever you are outside.  Heart disease, diabetes, and  high blood pressure  High blood pressure causes heart disease and increases the risk of stroke. High blood pressure is more likely to develop in: ? People who have blood pressure in the high end of the normal range (130-139/85-89 mm Hg). ? People who are overweight or obese. ? People who are African American.  If you are 39-62 years of age, have your blood pressure checked every 3-5 years. If you are 30 years of age or older, have your blood pressure checked every year. You should have your blood pressure measured twice-once when you are at a hospital or clinic, and once when you are not at a hospital or clinic. Record the average of the two measurements. To check your blood pressure when you are not at a hospital or clinic, you can use: ? An automated blood pressure machine at a pharmacy. ? A home blood pressure monitor.  If you are between 71 years and 84 years old, ask your health care provider if you should take aspirin to prevent strokes.  Have regular diabetes screenings. This involves taking a blood sample to check your fasting blood sugar level. ? If you are at a normal weight and have a low risk for diabetes, have this test once every three years after 74 years of age. ? If you are overweight and have a high risk for diabetes, consider being tested at a younger age or more often. Preventing infection Hepatitis B  If you have a higher risk for hepatitis B, you should be screened for this virus. You are considered at high risk for hepatitis B if: ? You were born in a country where hepatitis B is common. Ask your health care provider which countries are considered high risk. ? Your parents were born in a high-risk country, and you have not been immunized against hepatitis B (hepatitis B vaccine). ? You have HIV or AIDS. ? You use needles to inject street drugs. ? You live with someone who has hepatitis B. ? You have had sex with someone who has hepatitis B. ? You get hemodialysis  treatment. ? You take certain medicines for conditions, including cancer, organ transplantation, and autoimmune conditions.  Hepatitis C  Blood testing is recommended for: ? Everyone born from [birth year] through 1963. ? Anyone with known risk factors for hepatitis C.  Sexually transmitted infections (STIs)  You should be screened for sexually transmitted infections (STIs) including gonorrhea and chlamydia if: ? You are sexually active and are younger than 74 years of age. ? You are older than 74 years of age and your health care provider tells you that you are at risk for this type of infection. ? Your sexual activity has changed since you were last screened and you are at an increased risk for chlamydia or gonorrhea. Ask your health care provider if you are at risk.  If you do not have HIV, but are at risk, it may be recommended that you take a prescription medicine daily to prevent HIV infection. This is called  pre-exposure prophylaxis (PrEP). You are considered at risk if: ? You are sexually active and do not regularly use condoms or know the HIV status of your partner(s). ? You take drugs by injection. ? You are sexually active with a partner who has HIV.  Talk with your health care provider about whether you are at high risk of being infected with HIV. If you choose to begin PrEP, you should first be tested for HIV. You should then be tested every 3 months for as long as you are taking PrEP. Pregnancy  If you are premenopausal and you may become pregnant, ask your health care provider about preconception counseling.  If you may become pregnant, take 400 to 800 micrograms (mcg) of folic acid every day.  If you want to prevent pregnancy, talk to your health care provider about birth control (contraception). Osteoporosis and menopause  Osteoporosis is a disease in which the bones lose minerals and strength with aging. This can result in serious bone fractures. Your risk for osteoporosis  can be identified using a bone density scan.  If you are 77 years of age or older, or if you are at risk for osteoporosis and fractures, ask your health care provider if you should be screened.  Ask your health care provider whether you should take a calcium or vitamin D supplement to lower your risk for osteoporosis.  Menopause may have certain physical symptoms and risks.  Hormone replacement therapy may reduce some of these symptoms and risks. Talk to your health care provider about whether hormone replacement therapy is right for you. Follow these instructions at home:  Schedule regular health, dental, and eye exams.  Stay current with your immunizations.  Do not use any tobacco products including cigarettes, chewing tobacco, or electronic cigarettes.  If you are pregnant, do not drink alcohol.  If you are breastfeeding, limit how much and how often you drink alcohol.  Limit alcohol intake to no more than 1 drink per day for nonpregnant women. One drink equals 12 ounces of beer, 5 ounces of wine, or 1 ounces of hard liquor.  Do not use street drugs.  Do not share needles.  Ask your health care provider for help if you need support or information about quitting drugs.  Tell your health care provider if you often feel depressed.  Tell your health care provider if you have ever been abused or do not feel safe at home. This information is not intended to replace advice given to you by your health care provider. Make sure you discuss any questions you have with your health care provider. Document Released: 08/28/2010 Document Revised: 07/21/2015 Document Reviewed: 11/16/2014 Elsevier Interactive Patient Education  [employer].

## 2016-11-13 NOTE — Telephone Encounter (Signed)
fup on home BP  Pt BP 160/80 Declines MD apt   Outreach to see what home readings have been

## 2016-11-15 ENCOUNTER — Encounter: Payer: Self-pay | Admitting: Family Medicine

## 2016-11-20 DIAGNOSIS — L821 Other seborrheic keratosis: Secondary | ICD-10-CM | POA: Diagnosis not present

## 2016-11-20 DIAGNOSIS — L820 Inflamed seborrheic keratosis: Secondary | ICD-10-CM | POA: Diagnosis not present

## 2016-11-20 DIAGNOSIS — B353 Tinea pedis: Secondary | ICD-10-CM | POA: Diagnosis not present

## 2016-11-20 DIAGNOSIS — L570 Actinic keratosis: Secondary | ICD-10-CM | POA: Diagnosis not present

## 2016-11-21 NOTE — Telephone Encounter (Signed)
Call to the patient and unable to reach to fup on home reading for her BP She does have an apt with Dr. Elease Hashimoto in October. [place] RN

## 2016-12-17 ENCOUNTER — Ambulatory Visit: Payer: Medicare Other | Admitting: Family Medicine

## 2016-12-19 ENCOUNTER — Telehealth: Payer: Self-pay

## 2016-12-19 NOTE — Telephone Encounter (Signed)
Has apt with Dr. Elease Hashimoto on the 26th for BP review Sh  RN

## 2016-12-21 ENCOUNTER — Encounter: Payer: Self-pay | Admitting: Family Medicine

## 2016-12-21 ENCOUNTER — Ambulatory Visit (INDEPENDENT_AMBULATORY_CARE_PROVIDER_SITE_OTHER): Payer: Medicare Other | Admitting: Family Medicine

## 2016-12-21 VITALS — BP 130/80 | HR 89 | Temp 98.1°F | Wt 203.8 lb

## 2016-12-21 DIAGNOSIS — E039 Hypothyroidism, unspecified: Secondary | ICD-10-CM

## 2016-12-21 DIAGNOSIS — I10 Essential (primary) hypertension: Secondary | ICD-10-CM | POA: Diagnosis not present

## 2016-12-21 LAB — TSH: TSH: 1.65 u[IU]/mL (ref 0.35–4.50)

## 2016-12-21 NOTE — Progress Notes (Signed)
Subjective:     Patient ID: Stacy Wagner, female   DOB: 1940-08-13, 76 y.o.   MRN: [id number]  HPI Patient seen for medical follow-up. She was here to [place] for physical and had blood pressure 160/100. However, that morning she got a speeding ticket on the way here and we suspected her blood pressure was up partly related to that. She is compliant with her blood pressure medication. No recent headaches, dizziness, or chest pains.  Recent TSH under treated with TSH of 6.3. We increased her levothyroxine to 75 g daily. She has lost about 7 pounds and she treats this to cutting ice cream out of her diet. She hopes to lose more weight  Past Medical History:  Diagnosis Date  . AC (acromioclavicular) joint bone spurs   . Bursitis disorder    bil shoulders  . GERD (gastroesophageal reflux disease)   . Hearing loss    bil / no hearing aids  . Hypertension   . Hypothyroidism   . OA (osteoarthritis)   . Prolapse of female bladder, acquired   . Thyroid disease    Past Surgical History:  Procedure Laterality Date  . ABDOMINAL HYSTERECTOMY     uterine prolapse  . APPENDECTOMY    . BREAST SURGERY    . carpat tunnel  release     right hand  . COSMETIC SURGERY     tummy tuck /remove excess skin arms and legs  . HAND TENDON SURGERY     right hand  . SPINE SURGERY    . TONSILLECTOMY      reports that she has never smoked. She has never used smokeless tobacco. She reports that she does not drink alcohol or use drugs. family history includes Cancer in her mother; Diabetes in her father; Hypertension in her sister. Allergies  Allergen Reactions  . Codeine     nausea     Review of Systems  Constitutional: Negative for fatigue.  Eyes: Negative for visual disturbance.  Respiratory: Negative for cough, chest tightness, shortness of breath and wheezing.   Cardiovascular: Negative for chest pain, palpitations and leg swelling.  Endocrine: Negative for polydipsia and polyuria.   Neurological: Negative for dizziness, seizures, syncope, weakness, light-headedness and headaches.       Objective:   Physical Exam  Constitutional: She appears well-developed and well-nourished.  Eyes: Pupils are equal, round, and reactive to light.  Neck: Neck supple. No JVD present. No thyromegaly present.  Cardiovascular: Normal rate and regular rhythm.  Exam reveals no gallop.   Pulmonary/Chest: Effort normal and breath sounds normal. No respiratory distress. She has no wheezes. She has no rales.  Musculoskeletal: She exhibits no edema.  Neurological: She is alert.       Assessment:     #1 hypertension stable and at goal  #2 hypothyroidism recently under replaced    Plan:     -Repeat TSH -Continue weight loss efforts -We'll plan routine follow-up in one year for physical and sooner as needed  Eulas Post MD [facility] at [facility]

## 2016-12-27 DIAGNOSIS — H25813 Combined forms of age-related cataract, bilateral: Secondary | ICD-10-CM | POA: Diagnosis not present

## 2016-12-27 DIAGNOSIS — H524 Presbyopia: Secondary | ICD-10-CM | POA: Diagnosis not present

## 2016-12-27 DIAGNOSIS — H02831 Dermatochalasis of right upper eyelid: Secondary | ICD-10-CM | POA: Diagnosis not present

## 2016-12-27 DIAGNOSIS — H40013 Open angle with borderline findings, low risk, bilateral: Secondary | ICD-10-CM | POA: Diagnosis not present

## 2016-12-27 DIAGNOSIS — H02834 Dermatochalasis of left upper eyelid: Secondary | ICD-10-CM | POA: Diagnosis not present

## 2016-12-27 DIAGNOSIS — H02835 Dermatochalasis of left lower eyelid: Secondary | ICD-10-CM | POA: Diagnosis not present

## 2016-12-27 DIAGNOSIS — H02832 Dermatochalasis of right lower eyelid: Secondary | ICD-10-CM | POA: Diagnosis not present

## 2017-01-28 ENCOUNTER — Other Ambulatory Visit: Payer: Self-pay | Admitting: Family Medicine

## 2017-04-04 DIAGNOSIS — H25813 Combined forms of age-related cataract, bilateral: Secondary | ICD-10-CM | POA: Diagnosis not present

## 2017-04-04 DIAGNOSIS — H02831 Dermatochalasis of right upper eyelid: Secondary | ICD-10-CM | POA: Diagnosis not present

## 2017-04-04 DIAGNOSIS — H02832 Dermatochalasis of right lower eyelid: Secondary | ICD-10-CM | POA: Diagnosis not present

## 2017-04-04 DIAGNOSIS — H02834 Dermatochalasis of left upper eyelid: Secondary | ICD-10-CM | POA: Diagnosis not present

## 2017-04-04 DIAGNOSIS — H40013 Open angle with borderline findings, low risk, bilateral: Secondary | ICD-10-CM | POA: Diagnosis not present

## 2017-04-08 DIAGNOSIS — M545 Low back pain: Secondary | ICD-10-CM | POA: Diagnosis not present

## 2017-04-08 DIAGNOSIS — Z6830 Body mass index (BMI) 30.0-30.9, adult: Secondary | ICD-10-CM | POA: Diagnosis not present

## 2017-04-08 DIAGNOSIS — I10 Essential (primary) hypertension: Secondary | ICD-10-CM | POA: Diagnosis not present

## 2017-04-16 DIAGNOSIS — M545 Low back pain: Secondary | ICD-10-CM | POA: Diagnosis not present

## 2017-04-18 DIAGNOSIS — M5126 Other intervertebral disc displacement, lumbar region: Secondary | ICD-10-CM | POA: Diagnosis not present

## 2017-04-18 DIAGNOSIS — M545 Low back pain: Secondary | ICD-10-CM | POA: Diagnosis not present

## 2017-04-27 ENCOUNTER — Other Ambulatory Visit: Payer: Self-pay | Admitting: Family Medicine

## 2017-04-29 NOTE — Telephone Encounter (Signed)
Refill for 6 months. 

## 2017-04-30 DIAGNOSIS — M5416 Radiculopathy, lumbar region: Secondary | ICD-10-CM | POA: Diagnosis not present

## 2017-05-20 DIAGNOSIS — Z6830 Body mass index (BMI) 30.0-30.9, adult: Secondary | ICD-10-CM | POA: Diagnosis not present

## 2017-05-20 DIAGNOSIS — M48062 Spinal stenosis, lumbar region with neurogenic claudication: Secondary | ICD-10-CM | POA: Diagnosis not present

## 2017-05-20 DIAGNOSIS — M5416 Radiculopathy, lumbar region: Secondary | ICD-10-CM | POA: Diagnosis not present

## 2017-05-20 DIAGNOSIS — I10 Essential (primary) hypertension: Secondary | ICD-10-CM | POA: Diagnosis not present

## 2017-05-21 DIAGNOSIS — L570 Actinic keratosis: Secondary | ICD-10-CM | POA: Diagnosis not present

## 2017-05-21 DIAGNOSIS — D2262 Melanocytic nevi of left upper limb, including shoulder: Secondary | ICD-10-CM | POA: Diagnosis not present

## 2017-05-21 DIAGNOSIS — M48062 Spinal stenosis, lumbar region with neurogenic claudication: Secondary | ICD-10-CM | POA: Diagnosis not present

## 2017-05-21 DIAGNOSIS — L820 Inflamed seborrheic keratosis: Secondary | ICD-10-CM | POA: Diagnosis not present

## 2017-05-21 DIAGNOSIS — L821 Other seborrheic keratosis: Secondary | ICD-10-CM | POA: Diagnosis not present

## 2017-06-05 DIAGNOSIS — M1712 Unilateral primary osteoarthritis, left knee: Secondary | ICD-10-CM | POA: Insufficient documentation

## 2017-06-05 DIAGNOSIS — M25562 Pain in left knee: Secondary | ICD-10-CM | POA: Diagnosis not present

## 2017-06-19 DIAGNOSIS — M48062 Spinal stenosis, lumbar region with neurogenic claudication: Secondary | ICD-10-CM | POA: Diagnosis not present

## 2017-06-19 DIAGNOSIS — I10 Essential (primary) hypertension: Secondary | ICD-10-CM | POA: Diagnosis not present

## 2017-06-19 DIAGNOSIS — Z6831 Body mass index (BMI) 31.0-31.9, adult: Secondary | ICD-10-CM | POA: Diagnosis not present

## 2017-07-04 DIAGNOSIS — H02831 Dermatochalasis of right upper eyelid: Secondary | ICD-10-CM | POA: Diagnosis not present

## 2017-07-04 DIAGNOSIS — H25813 Combined forms of age-related cataract, bilateral: Secondary | ICD-10-CM | POA: Diagnosis not present

## 2017-07-04 DIAGNOSIS — H02832 Dermatochalasis of right lower eyelid: Secondary | ICD-10-CM | POA: Diagnosis not present

## 2017-07-04 DIAGNOSIS — H02834 Dermatochalasis of left upper eyelid: Secondary | ICD-10-CM | POA: Diagnosis not present

## 2017-07-04 DIAGNOSIS — H524 Presbyopia: Secondary | ICD-10-CM | POA: Diagnosis not present

## 2017-07-29 DIAGNOSIS — I10 Essential (primary) hypertension: Secondary | ICD-10-CM | POA: Diagnosis not present

## 2017-07-29 DIAGNOSIS — Z6830 Body mass index (BMI) 30.0-30.9, adult: Secondary | ICD-10-CM | POA: Diagnosis not present

## 2017-07-29 DIAGNOSIS — M4120 Other idiopathic scoliosis, site unspecified: Secondary | ICD-10-CM | POA: Diagnosis not present

## 2017-07-31 ENCOUNTER — Other Ambulatory Visit: Payer: Self-pay | Admitting: Student

## 2017-07-31 DIAGNOSIS — M4120 Other idiopathic scoliosis, site unspecified: Secondary | ICD-10-CM

## 2017-08-12 ENCOUNTER — Ambulatory Visit
Admission: RE | Admit: 2017-08-12 | Discharge: 2017-08-12 | Disposition: A | Discharge status: Home / Self Care | Payer: Medicare Other | Source: Ambulatory Visit | Attending: Student | Admitting: Student

## 2017-08-12 DIAGNOSIS — M48061 Spinal stenosis, lumbar region without neurogenic claudication: Secondary | ICD-10-CM | POA: Diagnosis not present

## 2017-08-12 DIAGNOSIS — M4120 Other idiopathic scoliosis, site unspecified: Secondary | ICD-10-CM

## 2017-08-12 DIAGNOSIS — M4727 Other spondylosis with radiculopathy, lumbosacral region: Secondary | ICD-10-CM | POA: Diagnosis not present

## 2017-08-19 DIAGNOSIS — M4120 Other idiopathic scoliosis, site unspecified: Secondary | ICD-10-CM | POA: Diagnosis not present

## 2017-08-19 DIAGNOSIS — Z6830 Body mass index (BMI) 30.0-30.9, adult: Secondary | ICD-10-CM | POA: Diagnosis not present

## 2017-08-19 DIAGNOSIS — I10 Essential (primary) hypertension: Secondary | ICD-10-CM | POA: Diagnosis not present

## 2017-08-27 ENCOUNTER — Other Ambulatory Visit: Payer: Self-pay | Admitting: Family Medicine

## 2017-09-17 DIAGNOSIS — M5416 Radiculopathy, lumbar region: Secondary | ICD-10-CM | POA: Diagnosis not present

## 2017-09-17 DIAGNOSIS — M48062 Spinal stenosis, lumbar region with neurogenic claudication: Secondary | ICD-10-CM | POA: Diagnosis not present

## 2017-09-17 DIAGNOSIS — I10 Essential (primary) hypertension: Secondary | ICD-10-CM | POA: Diagnosis not present

## 2017-09-17 DIAGNOSIS — Z6830 Body mass index (BMI) 30.0-30.9, adult: Secondary | ICD-10-CM | POA: Diagnosis not present

## 2017-09-17 DIAGNOSIS — M47816 Spondylosis without myelopathy or radiculopathy, lumbar region: Secondary | ICD-10-CM | POA: Diagnosis not present

## 2017-10-02 DIAGNOSIS — M47816 Spondylosis without myelopathy or radiculopathy, lumbar region: Secondary | ICD-10-CM | POA: Diagnosis not present

## 2017-10-08 DIAGNOSIS — K1329 Other disturbances of oral epithelium, including tongue: Secondary | ICD-10-CM | POA: Diagnosis not present

## 2017-10-18 ENCOUNTER — Encounter: Payer: Self-pay | Admitting: Family Medicine

## 2017-10-18 ENCOUNTER — Ambulatory Visit (INDEPENDENT_AMBULATORY_CARE_PROVIDER_SITE_OTHER): Payer: Medicare Other | Admitting: Family Medicine

## 2017-10-18 VITALS — BP 130/80 | HR 89 | Temp 98.1°F | Wt 204.2 lb

## 2017-10-18 DIAGNOSIS — M1990 Unspecified osteoarthritis, unspecified site: Secondary | ICD-10-CM | POA: Diagnosis not present

## 2017-10-18 DIAGNOSIS — K219 Gastro-esophageal reflux disease without esophagitis: Secondary | ICD-10-CM

## 2017-10-18 DIAGNOSIS — E039 Hypothyroidism, unspecified: Secondary | ICD-10-CM | POA: Diagnosis not present

## 2017-10-18 DIAGNOSIS — E785 Hyperlipidemia, unspecified: Secondary | ICD-10-CM | POA: Diagnosis not present

## 2017-10-18 DIAGNOSIS — I10 Essential (primary) hypertension: Secondary | ICD-10-CM

## 2017-10-18 LAB — HEPATIC FUNCTION PANEL
ALBUMIN: 4.3 g/dL (ref 3.5–5.2)
ALK PHOS: 81 U/L (ref 39–117)
ALT: 16 U/L (ref 0–35)
AST: 19 U/L (ref 0–37)
Bilirubin, Direct: 0.1 mg/dL (ref 0.0–0.3)
Total Bilirubin: 0.5 mg/dL (ref 0.2–1.2)
Total Protein: 7.4 g/dL (ref 6.0–8.3)

## 2017-10-18 LAB — LIPID PANEL
Cholesterol: 232 mg/dL — ABNORMAL HIGH (ref 0–200)
HDL: 43.9 mg/dL (ref >39.00)
LDL Cholesterol: 156 mg/dL — ABNORMAL HIGH (ref 0–99)
NonHDL: 188.49
Total CHOL/HDL Ratio: 5
Triglycerides: 164 mg/dL — ABNORMAL HIGH (ref 0.0–149.0)
VLDL: 32.8 mg/dL (ref 0.0–40.0)

## 2017-10-18 LAB — BASIC METABOLIC PANEL
BUN: 22 mg/dL (ref 6–23)
CALCIUM: 10.1 mg/dL (ref 8.4–10.5)
CO2: 31 mEq/L (ref 19–32)
Chloride: 100 mEq/L (ref 96–112)
Creatinine, Ser: 0.99 mg/dL (ref 0.40–1.20)
GFR: 58.12 mL/min — AB (ref >60.00)
Glucose, Bld: 104 mg/dL — ABNORMAL HIGH (ref 70–99)
POTASSIUM: 4.2 meq/L (ref 3.5–5.1)
SODIUM: 140 meq/L (ref 135–145)

## 2017-10-18 LAB — TSH: TSH: 2.54 u[IU]/mL (ref 0.35–4.50)

## 2017-10-18 MED ORDER — EST ESTROGENS-METHYLTEST 1.25-2.5 MG PO TABS: 1.0000 | ORAL_TABLET | Freq: Every day | ORAL | 1 refills | Status: DC

## 2017-10-18 MED ORDER — TRIAMTERENE-HCTZ 75-50 MG PO TABS: 1.0000 | ORAL_TABLET | Freq: Every day | ORAL | 3 refills | Status: DC

## 2017-10-18 MED ORDER — LEVOTHYROXINE SODIUM 75 MCG PO TABS: ORAL_TABLET | ORAL | 3 refills | Status: DC

## 2017-10-18 MED ORDER — CYCLOBENZAPRINE HCL 10 MG PO TABS: 10.0000 mg | ORAL_TABLET | Freq: Three times a day (TID) | ORAL | 1 refills | Status: DC | PRN

## 2017-10-18 NOTE — Progress Notes (Signed)
  Subjective:     Patient ID: Stacy Wagner, female   DOB: 25-Jan-1941, 77 y.o.   MRN: [id number]  HPI Patient here for medical follow-up. She has history of hypertension, hypothyroidism, osteoarthritis. She's had some chronic back difficulties and is seen by specialist. She's had some epidural injections without much improvement. This has limited her activities. She needs refills of several medications today including levothyroxine, Maxide, and Estratest. She gets regular mammograms. Also requests refill Flexeril which takes for intermittent upper back pain.  No recent falls. She declines flu vaccine  Past Medical History:  Diagnosis Date  . AC (acromioclavicular) joint bone spurs   . Bursitis disorder    bil shoulders  . GERD (gastroesophageal reflux disease)   . Hearing loss    bil / no hearing aids  . Hypertension   . Hypothyroidism   . OA (osteoarthritis)   . Prolapse of female bladder, acquired   . Thyroid disease    Past Surgical History:  Procedure Laterality Date  . ABDOMINAL HYSTERECTOMY     uterine prolapse  . APPENDECTOMY    . BREAST SURGERY    . carpat tunnel  release     right hand  . COSMETIC SURGERY     tummy tuck /remove excess skin arms and legs  . HAND TENDON SURGERY     right hand  . SPINE SURGERY    . TONSILLECTOMY      reports that she has never smoked. She has never used smokeless tobacco. She reports that she does not drink alcohol or use drugs. family history includes Cancer in her mother; Diabetes in her father; Hypertension in her sister. Allergies  Allergen Reactions  . Codeine     nausea     Review of Systems  Constitutional: Negative for fatigue.  Eyes: Negative for visual disturbance.  Respiratory: Negative for cough, chest tightness, shortness of breath and wheezing.   Cardiovascular: Negative for chest pain, palpitations and leg swelling.  Neurological: Negative for dizziness, seizures, syncope, weakness, light-headedness and  headaches.       Objective:   Physical Exam  Constitutional: She appears well-developed and well-nourished.  Eyes: Pupils are equal, round, and reactive to light.  Neck: Neck supple. No JVD present. No thyromegaly present.  Cardiovascular: Normal rate and regular rhythm. Exam reveals no gallop.  Pulmonary/Chest: Effort normal and breath sounds normal. No respiratory distress. She has no wheezes. She has no rales.  Musculoskeletal: She exhibits no edema.  Neurological: She is alert.       Assessment:     #1 hypertension stable and at goal  #2 hypothyroidism  #3 postmenopausal on estrogen replacement    Plan:     -refilled her Synthroid, Maxzide, Estratest and Flexeril -Check lab work with basic metabolic panel, lipid panel, hepatic panel, TSH -Recommended flu vaccine and she declines -We had discussions regarding pros and cons of postmenopausal estrogen therapy. She is aware of risks including DVT/PE risk, CVA.  Eulas Post MD [facility] at [facility]

## 2017-10-22 ENCOUNTER — Telehealth: Payer: Self-pay | Admitting: *Deleted

## 2017-10-22 NOTE — Telephone Encounter (Signed)
Prior auth for Estrogen-methyltest 1.25-2.5mg  sent to [email or web address]-key [id number].

## 2017-10-22 NOTE — Telephone Encounter (Signed)
Prior auth for Cyclobenzaprine 10mg  sent to [email or web address]-key [id number].

## 2017-10-25 NOTE — Telephone Encounter (Signed)
Fax received from [facility] stating the request was denied and this was given to Dr Burchette's asst.

## 2017-10-29 NOTE — Telephone Encounter (Signed)
Placed in Dr Burchette's folder 

## 2017-10-29 NOTE — Telephone Encounter (Signed)
Fax received from [facility] stating the request was approved and this was unable to be scanned due to torn paper.  I called [facility] and left a detailed message with this info.

## 2017-10-31 ENCOUNTER — Other Ambulatory Visit: Payer: Self-pay | Admitting: Family Medicine

## 2017-11-04 DIAGNOSIS — Z1231 Encounter for screening mammogram for malignant neoplasm of breast: Secondary | ICD-10-CM | POA: Diagnosis not present

## 2017-11-04 LAB — HM MAMMOGRAPHY

## 2017-11-08 DIAGNOSIS — M1712 Unilateral primary osteoarthritis, left knee: Secondary | ICD-10-CM | POA: Diagnosis not present

## 2017-11-11 DIAGNOSIS — H903 Sensorineural hearing loss, bilateral: Secondary | ICD-10-CM | POA: Diagnosis not present

## 2017-11-12 DIAGNOSIS — M5416 Radiculopathy, lumbar region: Secondary | ICD-10-CM | POA: Diagnosis not present

## 2017-11-12 DIAGNOSIS — I10 Essential (primary) hypertension: Secondary | ICD-10-CM | POA: Diagnosis not present

## 2017-11-12 DIAGNOSIS — M47816 Spondylosis without myelopathy or radiculopathy, lumbar region: Secondary | ICD-10-CM | POA: Diagnosis not present

## 2017-11-12 DIAGNOSIS — M48062 Spinal stenosis, lumbar region with neurogenic claudication: Secondary | ICD-10-CM | POA: Diagnosis not present

## 2017-11-15 ENCOUNTER — Ambulatory Visit: Payer: Medicare Other

## 2017-11-15 DIAGNOSIS — M1712 Unilateral primary osteoarthritis, left knee: Secondary | ICD-10-CM | POA: Diagnosis not present

## 2017-11-20 DIAGNOSIS — M47816 Spondylosis without myelopathy or radiculopathy, lumbar region: Secondary | ICD-10-CM | POA: Diagnosis not present

## 2017-11-22 DIAGNOSIS — M1712 Unilateral primary osteoarthritis, left knee: Secondary | ICD-10-CM | POA: Diagnosis not present

## 2017-11-25 DIAGNOSIS — L821 Other seborrheic keratosis: Secondary | ICD-10-CM | POA: Diagnosis not present

## 2017-11-25 DIAGNOSIS — L814 Other melanin hyperpigmentation: Secondary | ICD-10-CM | POA: Diagnosis not present

## 2017-11-25 DIAGNOSIS — D2262 Melanocytic nevi of left upper limb, including shoulder: Secondary | ICD-10-CM | POA: Diagnosis not present

## 2017-11-25 DIAGNOSIS — D0472 Carcinoma in situ of skin of left lower limb, including hip: Secondary | ICD-10-CM | POA: Diagnosis not present

## 2017-11-25 DIAGNOSIS — D485 Neoplasm of uncertain behavior of skin: Secondary | ICD-10-CM | POA: Diagnosis not present

## 2017-11-25 DIAGNOSIS — L570 Actinic keratosis: Secondary | ICD-10-CM | POA: Diagnosis not present

## 2017-11-26 DIAGNOSIS — C4490 Unspecified malignant neoplasm of skin, unspecified: Secondary | ICD-10-CM

## 2017-11-26 HISTORY — PX: SKIN CANCER EXCISION: SHX779

## 2017-11-26 HISTORY — DX: Unspecified malignant neoplasm of skin, unspecified: C44.90

## 2017-12-31 DIAGNOSIS — M1712 Unilateral primary osteoarthritis, left knee: Secondary | ICD-10-CM | POA: Diagnosis not present

## 2018-01-01 DIAGNOSIS — C021 Malignant neoplasm of border of tongue: Secondary | ICD-10-CM | POA: Diagnosis not present

## 2018-01-03 DIAGNOSIS — H40013 Open angle with borderline findings, low risk, bilateral: Secondary | ICD-10-CM | POA: Diagnosis not present

## 2018-01-06 DIAGNOSIS — C021 Malignant neoplasm of border of tongue: Secondary | ICD-10-CM | POA: Diagnosis not present

## 2018-01-07 DIAGNOSIS — C021 Malignant neoplasm of border of tongue: Secondary | ICD-10-CM | POA: Diagnosis not present

## 2018-01-08 ENCOUNTER — Encounter: Payer: Self-pay | Admitting: Family Medicine

## 2018-01-08 ENCOUNTER — Other Ambulatory Visit: Payer: Self-pay

## 2018-01-08 ENCOUNTER — Ambulatory Visit (INDEPENDENT_AMBULATORY_CARE_PROVIDER_SITE_OTHER): Payer: Medicare Other | Admitting: Family Medicine

## 2018-01-08 VITALS — BP 136/88 | HR 93 | Temp 97.6°F | Wt 182.4 lb

## 2018-01-08 DIAGNOSIS — H6981 Other specified disorders of Eustachian tube, right ear: Secondary | ICD-10-CM

## 2018-01-08 DIAGNOSIS — H6991 Unspecified Eustachian tube disorder, right ear: Secondary | ICD-10-CM

## 2018-01-08 NOTE — Progress Notes (Signed)
  Subjective:     Patient ID: Stacy Wagner, female   DOB: 11/18/1940, 77 y.o.   MRN: [id number]  HPI Patient is seen with chief complaint of a "clogged right ear ".  Symptom duration one day.  She was concerned she may have either wax or ear infection.  Denies any nasal congestion.  No dizziness.  No ear drainage.  No ear pain.  She has chronic hearing loss and has bilateral hearing aids.  She had a tongue biopsy reportedly recently which showed signs of cancer.  She had several previous biopsies which had not shown cancer.  She has follow-up with ENT scheduled for tomorrow.  She has not noted any lymphadenopathy.  Past Medical History:  Diagnosis Date  . AC (acromioclavicular) joint bone spurs   . Bursitis disorder    bil shoulders  . GERD (gastroesophageal reflux disease)   . Hearing loss    bil / no hearing aids  . Hypertension   . Hypothyroidism   . OA (osteoarthritis)   . Prolapse of female bladder, acquired   . Thyroid disease    Past Surgical History:  Procedure Laterality Date  . ABDOMINAL HYSTERECTOMY     uterine prolapse  . APPENDECTOMY    . BREAST SURGERY    . carpat tunnel  release     right hand  . COSMETIC SURGERY     tummy tuck /remove excess skin arms and legs  . HAND TENDON SURGERY     right hand  . SPINE SURGERY    . TONSILLECTOMY      reports that she has never smoked. She has never used smokeless tobacco. She reports that she does not drink alcohol or use drugs. family history includes Cancer in her mother; Diabetes in her father; Hypertension in her sister. Allergies  Allergen Reactions  . Codeine     nausea     Review of Systems  Constitutional: Negative for appetite change, chills, fever and unexpected weight change.  HENT: Negative for ear discharge, ear pain and hearing loss.        Objective:   Physical Exam  Constitutional: She appears well-developed and well-nourished.  HENT:  Right Ear: External ear normal.  Left Ear: External  ear normal.  Neck: Neck supple.  Cardiovascular: Normal rate and regular rhythm.  Pulmonary/Chest: Effort normal and breath sounds normal.  Lymphadenopathy:    She has no cervical adenopathy.       Assessment:     Right ear fullness.  Normal exam.  No cerumen.  No eardrum abnormalities.  No evidence for effusion    Plan:     Right ear fullness with normal exam.  Question eustachian tube dysfunction.  Recommend observation.  She will try Valsalva maneuver.  Eulas Post MD  [facility] at [facility]

## 2018-01-08 NOTE — Patient Instructions (Signed)
Your ear looks good.  No sign of infection and no wax.  Try Valsalva maneuver to see if this helps.

## 2018-01-09 ENCOUNTER — Other Ambulatory Visit: Payer: Self-pay | Admitting: Otolaryngology

## 2018-01-09 DIAGNOSIS — C021 Malignant neoplasm of border of tongue: Secondary | ICD-10-CM | POA: Diagnosis not present

## 2018-01-09 DIAGNOSIS — C029 Malignant neoplasm of tongue, unspecified: Secondary | ICD-10-CM

## 2018-01-10 ENCOUNTER — Ambulatory Visit
Admission: RE | Admit: 2018-01-10 | Discharge: 2018-01-10 | Disposition: A | Discharge status: Home / Self Care | Payer: Medicare Other | Source: Ambulatory Visit | Attending: Otolaryngology | Admitting: Otolaryngology

## 2018-01-10 ENCOUNTER — Ambulatory Visit: Payer: Self-pay | Admitting: Otolaryngology

## 2018-01-10 DIAGNOSIS — C029 Malignant neoplasm of tongue, unspecified: Secondary | ICD-10-CM

## 2018-01-10 MED ORDER — IOPAMIDOL (ISOVUE-300) INJECTION 61%
60.0000 mL | Freq: Once | INTRAVENOUS | Status: AC | PRN
  Administered 2018-01-10: 60 mL via INTRAVENOUS

## 2018-01-10 NOTE — H&P (Signed)
PREOPERATIVE H&P  Chief Complaint: cancer on the right lateral tongue  HPI: Stacy Wagner is a 77 y.o. female who presents for evaluation of cancer on the right lateral tongue. 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 More recently has had increased soreness and repeat biopsy demonstrated squamous cell carcinoma. She's taken operating room this time for partial glossectomy and limited neck dissection.  Past Medical History:  Diagnosis Date  . AC (acromioclavicular) joint bone spurs   . Bursitis disorder    bil shoulders  . GERD (gastroesophageal reflux disease)   . Hearing loss    bil / no hearing aids  . Hypertension   . Hypothyroidism   . OA (osteoarthritis)   . Prolapse of female bladder, acquired   . Thyroid disease    Past Surgical History:  Procedure Laterality Date  . ABDOMINAL HYSTERECTOMY     uterine prolapse  . APPENDECTOMY    . BREAST SURGERY    . carpat tunnel  release     right hand  . COSMETIC SURGERY     tummy tuck /remove excess skin arms and legs  . HAND TENDON SURGERY     right hand  . SPINE SURGERY    . TONSILLECTOMY     Social History   Socioeconomic History  . Marital status: Married    Spouse name: Not on file  . Number of children: Not on file  . Years of education: Not on file  . Highest education level: Not on file  Occupational History  . Not on file  Social Needs  . Financial resource strain: Not on file  . Food insecurity:    Worry: Not on file    Inability: Not on file  . Transportation needs:    Medical: Not on file    Non-medical: Not on file  Tobacco Use  . Smoking status: Never Smoker  . Smokeless tobacco: Never Used  Substance and Sexual Activity  . Alcohol use: No    Alcohol/week: 0.0 standard drinks  . Drug use: No  . Sexual activity: Yes    Partners: Male  Lifestyle  . Physical activity:    Days per week: Not on  file    Minutes per session: Not on file  . Stress: Not on file  Relationships  . Social connections:    Talks on phone: Not on file    Gets together: Not on file    Attends religious service: Not on file    Active member of club or organization: Not on file    Attends meetings of clubs or organizations: Not on file    Relationship status: Not on file  Other Topics Concern  . Not on file  Social History Narrative  . Not on file   Family History  Problem Relation Age of Onset  . Cancer Mother        ?lung cancer  . Hypertension Sister   . Diabetes Father    Allergies  Allergen Reactions  . Codeine     nausea   Prior to Admission medications   Medication Sig Start Date End Date Taking? Authorizing Provider  Ascorbic Acid (VITAMIN C PO) Take by mouth daily.      [provider]  b complex vitamins tablet Take 1 tablet by mouth daily.    [provider]  Calcium 600-200 MG-UNIT per tablet Take 1 tablet by mouth  daily.      [provider]  Calcium Carbonate (CALTRATE 600 PO) Take by mouth daily.      [provider]  clotrimazole-betamethasone (LOTRISONE) cream Apply topically 2 (two) times daily. 10/01/12   Burchette, Alinda Sierras, MD  Cyanocobalamin (VITAMIN B 12 PO) Take by mouth daily.      [provider]  cyclobenzaprine (FLEXERIL) 10 MG tablet Take 1 tablet (10 mg total) by mouth 3 (three) times daily as needed. 10/18/17   Burchette, Alinda Sierras, MD  estrogens-methylTEST (ESTRATEST) 1.25-2.5 MG tablet TAKE 1 TABLET BY MOUTH EVERY DAY 11/01/17   Burchette, Alinda Sierras, MD  Glucosamine-Chondroitin (OSTEO BI-FLEX REGULAR STRENGTH PO) Take 1 tablet by mouth daily.    [provider]  levothyroxine (SYNTHROID, LEVOTHROID) 75 MCG tablet TAKE 1 TABLET BY MOUTH EVERY DAY ON AN EMPTY STOMACH 10/18/17   Burchette, Alinda Sierras, MD  multivitamin [facility]) per tablet Take 1 tablet by mouth daily. Centrum MVI -Take one daily    [provider]   mupirocin ointment (BACTROBAN) 2 % APPLY TO AFFECTED AREA TWICE PER DAY 06/24/14   [provider]  omeprazole (PRILOSEC) 20 MG capsule Take 20 mg by mouth daily.    [provider]  triamterene-hydrochlorothiazide (MAXZIDE) 75-50 MG tablet Take 1 tablet by mouth daily. 10/18/17 11/17/18  Burchette, Alinda Sierras, MD     Positive ROS: right lateral tongue soreness  All other systems have been reviewed and were otherwise negative with the exception of those mentioned in the HPI and as above.  Physical Exam: There were no vitals filed for this visit.  General: Alert, no acute distress Oral: Normal oral mucosa. Status post tonsillectomy. She has right lateral tongue leukoplakia, slight induration and opened sore from previous biopsy. Tongue has normal mobility. IDL reveals clear BOT, hypopharynx and larynx. Nasal: Clear nasal passages Neck: No palpable adenopathy or thyroid nodules. No submental or submandibular nodes noted. Ear: Ear canal is clear with normal appearing TMs Cardiovascular: Regular rate and rhythm, no murmur.  Respiratory: Clear to auscultation Neurologic: Alert and oriented x 3   Assessment/Plan: TONGUE CANCER Plan for Procedure(s): PARTIAL GLOSSECTOMY AND RIGHT MODIFIED NECK DISSECTION   [name], MD 01/10/2018 1:29 PM

## 2018-01-12 DIAGNOSIS — C021 Malignant neoplasm of border of tongue: Secondary | ICD-10-CM | POA: Diagnosis not present

## 2018-01-13 NOTE — Pre-Procedure Instructions (Signed)
Stacy Wagner  01/13/2018      [facility] #[id number], [address] [place] [address] Phone: [phone] Fax: [phone]    Your procedure is scheduled on November 20th.  Report to [facility] Admitting at [phone] A.M.  Call this number if you have problems the morning of surgery:  [phone]   Remember:  Do not eat or drink after midnight.    Take these medicines the morning of surgery with A SIP OF WATER   acetaminophen (TYLENOL) if needed  estrogens-methylTEST (ESTRATEST)  levothyroxine (SYNTHROID, LEVOTHROID   7 days prior to surgery STOP taking any Aspirin(unless otherwise instructed by your surgeon), Aleve, Naproxen, Ibuprofen, Motrin, Advil, Goody's, BC's, all herbal medications, fish oil, and all vitamins     Do not wear jewelry, make-up or nail polish.  Do not wear lotions, powders, or perfumes, or deodorant.  Do not shave 48 hours prior to surgery.  Men may shave face and neck.  Do not bring valuables to the hospital.  [facility] is not responsible for any belongings or valuables.  Contacts, dentures or bridgework may not be worn into surgery.  Leave your suitcase in the car.  After surgery it may be brought to your room.  For patients admitted to the hospital, discharge time will be determined by your treatment team.  Patients discharged the day of surgery will not be allowed to drive home.    - Preparing For Surgery  Before surgery, you can play an important role. Because skin is not sterile, your skin needs to be as free of germs as possible. You can reduce the number of germs on your skin by washing with CHG (chlorahexidine gluconate) Soap before surgery.  CHG is an antiseptic cleaner which kills germs and bonds with the skin to continue killing germs even after washing.    Oral Hygiene is also important to reduce your risk of  infection.  Remember - BRUSH YOUR TEETH THE MORNING OF SURGERY WITH YOUR REGULAR TOOTHPASTE  Please do not use if you have an allergy to CHG or antibacterial soaps. If your skin becomes reddened/irritated stop using the CHG.  Do not shave (including legs and underarms) for at least 48 hours prior to first CHG shower. It is OK to shave your face.  Please follow these instructions carefully.   1. Shower the NIGHT BEFORE SURGERY and the MORNING OF SURGERY with CHG.   2. If you chose to wash your hair, wash your hair first as usual with your normal shampoo.  3. After you shampoo, rinse your hair and body thoroughly to remove the shampoo.  4. Use CHG as you would any other liquid soap. You can apply CHG directly to the skin and wash gently with a scrungie or a clean washcloth.   5. Apply the CHG Soap to your body ONLY FROM THE NECK DOWN.  Do not use on open wounds or open sores. Avoid contact with your eyes, ears, mouth and genitals (private parts). Wash Face and genitals (private parts)  with your normal soap.  6. Wash thoroughly, paying special attention to the area where your surgery will be performed.  7. Thoroughly rinse your body with warm water from the neck down.  8. DO NOT shower/wash with your normal soap after using and rinsing off the CHG Soap.  9. Pat yourself dry with  a CLEAN TOWEL.  10. Wear CLEAN PAJAMAS to bed the night before surgery, wear comfortable clothes the morning of surgery  11. Place CLEAN SHEETS on your bed the night of your first shower and DO NOT SLEEP WITH PETS.    Day of Surgery:  Do not apply any deodorants/lotions.  Please wear clean clothes to the hospital/surgery center.   Remember to brush your teeth WITH YOUR REGULAR TOOTHPASTE.    Please read over the following fact sheets that you were given.

## 2018-01-14 ENCOUNTER — Encounter (HOSPITAL_COMMUNITY)
Admission: RE | Admit: 2018-01-14 | Discharge: 2018-01-14 | Disposition: A | Discharge status: Home / Self Care | Payer: Medicare Other | Source: Ambulatory Visit | Attending: Otolaryngology | Admitting: Otolaryngology

## 2018-01-14 ENCOUNTER — Encounter (HOSPITAL_COMMUNITY): Payer: Self-pay

## 2018-01-14 ENCOUNTER — Other Ambulatory Visit: Payer: Self-pay

## 2018-01-14 DIAGNOSIS — E039 Hypothyroidism, unspecified: Secondary | ICD-10-CM | POA: Diagnosis not present

## 2018-01-14 DIAGNOSIS — C029 Malignant neoplasm of tongue, unspecified: Secondary | ICD-10-CM | POA: Diagnosis not present

## 2018-01-14 DIAGNOSIS — Z833 Family history of diabetes mellitus: Secondary | ICD-10-CM | POA: Diagnosis not present

## 2018-01-14 DIAGNOSIS — Z8249 Family history of ischemic heart disease and other diseases of the circulatory system: Secondary | ICD-10-CM | POA: Diagnosis not present

## 2018-01-14 DIAGNOSIS — K219 Gastro-esophageal reflux disease without esophagitis: Secondary | ICD-10-CM | POA: Diagnosis not present

## 2018-01-14 DIAGNOSIS — Z809 Family history of malignant neoplasm, unspecified: Secondary | ICD-10-CM | POA: Diagnosis not present

## 2018-01-14 DIAGNOSIS — Z01818 Encounter for other preprocedural examination: Secondary | ICD-10-CM | POA: Insufficient documentation

## 2018-01-14 DIAGNOSIS — Z90710 Acquired absence of both cervix and uterus: Secondary | ICD-10-CM | POA: Diagnosis not present

## 2018-01-14 DIAGNOSIS — H9193 Unspecified hearing loss, bilateral: Secondary | ICD-10-CM | POA: Diagnosis not present

## 2018-01-14 DIAGNOSIS — Z79899 Other long term (current) drug therapy: Secondary | ICD-10-CM | POA: Diagnosis not present

## 2018-01-14 DIAGNOSIS — C023 Malignant neoplasm of anterior two-thirds of tongue, part unspecified: Secondary | ICD-10-CM | POA: Diagnosis present

## 2018-01-14 DIAGNOSIS — Z885 Allergy status to narcotic agent status: Secondary | ICD-10-CM | POA: Diagnosis not present

## 2018-01-14 DIAGNOSIS — M719 Bursopathy, unspecified: Secondary | ICD-10-CM | POA: Diagnosis not present

## 2018-01-14 DIAGNOSIS — M1990 Unspecified osteoarthritis, unspecified site: Secondary | ICD-10-CM | POA: Diagnosis not present

## 2018-01-14 DIAGNOSIS — I10 Essential (primary) hypertension: Secondary | ICD-10-CM | POA: Diagnosis not present

## 2018-01-14 HISTORY — DX: Adverse effect of unspecified anesthetic, initial encounter: T41.45XA

## 2018-01-14 HISTORY — DX: Nausea with vomiting, unspecified: R11.2

## 2018-01-14 HISTORY — DX: Other complications of anesthesia, initial encounter: T88.59XA

## 2018-01-14 HISTORY — DX: Other specified postprocedural states: Z98.890

## 2018-01-14 LAB — CBC
HCT: 45.2 % (ref 36.0–46.0)
Hemoglobin: 14.1 g/dL (ref 12.0–15.0)
MCH: 28 pg (ref 26.0–34.0)
MCHC: 31.2 g/dL (ref 30.0–36.0)
MCV: 89.7 fL (ref 80.0–100.0)
PLATELETS: 365 10*3/uL (ref 150–400)
RBC: 5.04 MIL/uL (ref 3.87–5.11)
RDW: 13.9 % (ref 11.5–15.5)
WBC: 8.5 10*3/uL (ref 4.0–10.5)
nRBC: 0 % (ref 0.0–0.2)

## 2018-01-14 LAB — BASIC METABOLIC PANEL
ANION GAP: 9 (ref 5–15)
BUN: 16 mg/dL (ref 8–23)
CALCIUM: 9.1 mg/dL (ref 8.9–10.3)
CO2: 24 mmol/L (ref 22–32)
Chloride: 105 mmol/L (ref 98–111)
Creatinine, Ser: 1.07 mg/dL — ABNORMAL HIGH (ref 0.44–1.00)
GFR calc Af Amer: 57 mL/min — ABNORMAL LOW (ref >60)
GFR, EST NON AFRICAN AMERICAN: 49 mL/min — AB (ref >60)
Glucose, Bld: 70 mg/dL (ref 70–99)
POTASSIUM: 3.2 mmol/L — AB (ref 3.5–5.1)
Sodium: 138 mmol/L (ref 135–145)

## 2018-01-14 MED ORDER — CHLORHEXIDINE GLUCONATE CLOTH 2 % EX PADS: 6.0000 | MEDICATED_PAD | Freq: Once | CUTANEOUS | Status: DC

## 2018-01-14 NOTE — Pre-Procedure Instructions (Signed)
Stacy Wagner  01/14/2018      [facility] #[id number], [address] [place] [address] Phone: [phone] Fax: [phone]    Your procedure is scheduled on November 20th.  Report to [facility] Admitting at [phone] A.M.  Call this number if you have problems the morning of surgery:  [phone]   Remember:  Do not eat or drink after midnight.    Take these medicines the morning of surgery with A SIP OF WATER   acetaminophen (TYLENOL) if needed  estrogens-methylTEST (ESTRATEST)  levothyroxine (SYNTHROID, LEVOTHROID   7 days prior to surgery STOP taking any Aspirin(unless otherwise instructed by your surgeon), Aleve, Naproxen, Ibuprofen, Motrin, Advil, Goody's, BC's, all herbal medications, fish oil, and all vitamins     Do not wear jewelry, make-up or nail polish.   Do not wear lotions, powders, or perfumes, or deodorant.   Do not shave 48 hours prior to surgery.     Do not bring valuables to the hospital.   [facility] is not responsible for any belongings or valuables.  Contacts, dentures or bridgework may not be worn into surgery.  Leave your suitcase in the car.  After surgery it may be brought to your room.  For patients admitted to the hospital, discharge time will be determined by your treatment team.  Patients discharged the day of surgery will not be allowed to drive home.    [place]- Preparing For Surgery  Before surgery, you can play an important role. Because skin is not sterile, your skin needs to be as free of germs as possible. You can reduce the number of germs on your skin by washing with CHG (chlorahexidine gluconate) Soap before surgery.  CHG is an antiseptic cleaner which kills germs and bonds with the skin to continue killing germs even after washing.    Oral Hygiene is also important to reduce your risk of infection.  Remember -  BRUSH YOUR TEETH THE MORNING OF SURGERY WITH YOUR REGULAR TOOTHPASTE  Please do not use if you have an allergy to CHG or antibacterial soaps. If your skin becomes reddened/irritated stop using the CHG.  Do not shave (including legs and underarms) for at least 48 hours prior to first CHG shower. It is OK to shave your face.  Please follow these instructions carefully.   1. Shower the NIGHT BEFORE SURGERY and the MORNING OF SURGERY with CHG.   2. If you chose to wash your hair, wash your hair first as usual with your normal shampoo.  3. After you shampoo, rinse your hair and body thoroughly to remove the shampoo.  4. Use CHG as you would any other liquid soap. You can apply CHG directly to the skin and wash gently with a scrungie or a clean washcloth.   5. Apply the CHG Soap to your body ONLY FROM THE NECK DOWN.  Do not use on open wounds or open sores. Avoid contact with your eyes, ears, mouth and genitals (private parts). Wash Face and genitals (private parts)  with your normal soap.  6. Wash thoroughly, paying special attention to the area where your surgery will be performed.  7. Thoroughly rinse your body with warm water from the neck down.  8. DO NOT shower/wash with your normal soap after using and rinsing off the CHG Soap.  9. Pat yourself dry with a  CLEAN TOWEL.  10. Wear CLEAN PAJAMAS to bed the night before surgery, wear comfortable clothes the morning of surgery  11. Place CLEAN SHEETS on your bed the night of your first shower and DO NOT SLEEP WITH PETS.    Day of Surgery:  Do not apply any deodorants/lotions.  Please wear clean clothes to the hospital/surgery center.   Remember to brush your teeth WITH YOUR REGULAR TOOTHPASTE.    Please read over the following fact sheets that you were given.

## 2018-01-14 NOTE — Progress Notes (Signed)
Pt. Denies any concerns related to her breathing or chest,  Denies ever having any advanced cardiac care or testing. PCP- Dr. Elease Hashimoto. Copied label from a vitamin, amino source that she takes daily.  Paper copy in binder chart.

## 2018-01-15 ENCOUNTER — Inpatient Hospital Stay (HOSPITAL_COMMUNITY): Payer: Medicare Other | Admitting: Registered Nurse

## 2018-01-15 ENCOUNTER — Encounter (HOSPITAL_COMMUNITY)
Admission: RE | Disposition: A | Discharge status: Home / Self Care | Payer: Self-pay | Source: Ambulatory Visit | Attending: Otolaryngology

## 2018-01-15 ENCOUNTER — Encounter (HOSPITAL_COMMUNITY): Payer: Self-pay

## 2018-01-15 ENCOUNTER — Observation Stay (HOSPITAL_COMMUNITY)
Admission: RE | Admit: 2018-01-15 | Discharge: 2018-01-16 | Disposition: A | Discharge status: Home / Self Care | Payer: Medicare Other | Source: Ambulatory Visit | Attending: Otolaryngology | Admitting: Otolaryngology

## 2018-01-15 ENCOUNTER — Other Ambulatory Visit: Payer: Self-pay

## 2018-01-15 DIAGNOSIS — Z8249 Family history of ischemic heart disease and other diseases of the circulatory system: Secondary | ICD-10-CM | POA: Insufficient documentation

## 2018-01-15 DIAGNOSIS — M719 Bursopathy, unspecified: Secondary | ICD-10-CM | POA: Diagnosis not present

## 2018-01-15 DIAGNOSIS — Z79899 Other long term (current) drug therapy: Secondary | ICD-10-CM | POA: Insufficient documentation

## 2018-01-15 DIAGNOSIS — Z90710 Acquired absence of both cervix and uterus: Secondary | ICD-10-CM | POA: Diagnosis not present

## 2018-01-15 DIAGNOSIS — K219 Gastro-esophageal reflux disease without esophagitis: Secondary | ICD-10-CM | POA: Diagnosis not present

## 2018-01-15 DIAGNOSIS — E039 Hypothyroidism, unspecified: Secondary | ICD-10-CM | POA: Diagnosis not present

## 2018-01-15 DIAGNOSIS — C029 Malignant neoplasm of tongue, unspecified: Secondary | ICD-10-CM | POA: Diagnosis not present

## 2018-01-15 DIAGNOSIS — M1990 Unspecified osteoarthritis, unspecified site: Secondary | ICD-10-CM | POA: Insufficient documentation

## 2018-01-15 DIAGNOSIS — Z833 Family history of diabetes mellitus: Secondary | ICD-10-CM | POA: Diagnosis not present

## 2018-01-15 DIAGNOSIS — Z809 Family history of malignant neoplasm, unspecified: Secondary | ICD-10-CM | POA: Insufficient documentation

## 2018-01-15 DIAGNOSIS — H9193 Unspecified hearing loss, bilateral: Secondary | ICD-10-CM | POA: Diagnosis not present

## 2018-01-15 DIAGNOSIS — Z885 Allergy status to narcotic agent status: Secondary | ICD-10-CM | POA: Insufficient documentation

## 2018-01-15 DIAGNOSIS — C023 Malignant neoplasm of anterior two-thirds of tongue, part unspecified: Secondary | ICD-10-CM

## 2018-01-15 DIAGNOSIS — C021 Malignant neoplasm of border of tongue: Secondary | ICD-10-CM | POA: Diagnosis not present

## 2018-01-15 DIAGNOSIS — I10 Essential (primary) hypertension: Secondary | ICD-10-CM | POA: Diagnosis not present

## 2018-01-15 DIAGNOSIS — E785 Hyperlipidemia, unspecified: Secondary | ICD-10-CM | POA: Diagnosis not present

## 2018-01-15 HISTORY — DX: Low back pain, unspecified: M54.50

## 2018-01-15 HISTORY — DX: Unspecified malignant neoplasm of skin, unspecified: C44.90

## 2018-01-15 HISTORY — DX: Migraine, unspecified, not intractable, without status migrainosus: G43.909

## 2018-01-15 HISTORY — DX: Other chronic pain: G89.29

## 2018-01-15 HISTORY — PX: GLOSSECTOMY: SHX5200

## 2018-01-15 HISTORY — PX: PARTIAL GLOSSECTOMY: SHX2173

## 2018-01-15 HISTORY — DX: Malignant neoplasm of tongue, unspecified: C02.9

## 2018-01-15 HISTORY — DX: Low back pain: M54.5

## 2018-01-15 HISTORY — DX: Unspecified osteoarthritis, unspecified site: M19.90

## 2018-01-15 HISTORY — DX: Family history of other specified conditions: Z84.89

## 2018-01-15 SURGERY — GLOSSECTOMY
Anesthesia: General

## 2018-01-15 MED ORDER — LIDOCAINE-EPINEPHRINE 1 %-1:100000 IJ SOLN
INTRAMUSCULAR | Status: AC
  Filled 2018-01-15: qty 1

## 2018-01-15 MED ORDER — PROPOFOL 10 MG/ML IV BOLUS
INTRAVENOUS | Status: AC
  Filled 2018-01-15: qty 20

## 2018-01-15 MED ORDER — BACITRACIN ZINC 500 UNIT/GM EX OINT
1.0000 "application " | TOPICAL_OINTMENT | Freq: Three times a day (TID) | CUTANEOUS | Status: DC
  Administered 2018-01-15: 1 via TOPICAL
  Filled 2018-01-15: qty 28.35

## 2018-01-15 MED ORDER — EST ESTROGENS-METHYLTEST 1.25-2.5 MG PO TABS: 1.0000 | ORAL_TABLET | Freq: Every day | ORAL | Status: DC

## 2018-01-15 MED ORDER — 0.9 % SODIUM CHLORIDE (POUR BTL) OPTIME
TOPICAL | Status: DC | PRN
  Administered 2018-01-15: 1000 mL

## 2018-01-15 MED ORDER — FENTANYL CITRATE (PF) 100 MCG/2ML IJ SOLN: 25.0000 ug | INTRAMUSCULAR | Status: DC | PRN

## 2018-01-15 MED ORDER — LIDOCAINE 2% (20 MG/ML) 5 ML SYRINGE
INTRAMUSCULAR | Status: AC
  Filled 2018-01-15: qty 5

## 2018-01-15 MED ORDER — MORPHINE SULFATE (PF) 2 MG/ML IV SOLN: 2.0000 mg | INTRAVENOUS | Status: DC | PRN

## 2018-01-15 MED ORDER — ACETAMINOPHEN 500 MG PO TABS: 1000.0000 mg | ORAL_TABLET | Freq: Four times a day (QID) | ORAL | Status: DC | PRN

## 2018-01-15 MED ORDER — LACTATED RINGERS IV SOLN
INTRAVENOUS | Status: DC
  Administered 2018-01-15: 12:00:00 via INTRAVENOUS

## 2018-01-15 MED ORDER — ONDANSETRON HCL 4 MG/2ML IJ SOLN
INTRAMUSCULAR | Status: DC | PRN
  Administered 2018-01-15: 4 mg via INTRAVENOUS

## 2018-01-15 MED ORDER — DIPHENHYDRAMINE-APAP (SLEEP) 25-500 MG PO TABS: 2.0000 | ORAL_TABLET | Freq: Every evening | ORAL | Status: DC | PRN

## 2018-01-15 MED ORDER — ROCURONIUM BROMIDE 10 MG/ML (PF) SYRINGE
PREFILLED_SYRINGE | INTRAVENOUS | Status: DC | PRN
  Administered 2018-01-15: 10 mg via INTRAVENOUS
  Administered 2018-01-15: 40 mg via INTRAVENOUS

## 2018-01-15 MED ORDER — KCL IN DEXTROSE-NACL 20-5-0.45 MEQ/L-%-% IV SOLN
INTRAVENOUS | Status: DC
  Administered 2018-01-15 – 2018-01-16 (×2): via INTRAVENOUS
  Filled 2018-01-15 (×2): qty 1000

## 2018-01-15 MED ORDER — HYDROCODONE-ACETAMINOPHEN 7.5-325 MG/15ML PO SOLN: 10.0000 mL | ORAL | Status: DC | PRN

## 2018-01-15 MED ORDER — FENTANYL CITRATE (PF) 250 MCG/5ML IJ SOLN
INTRAMUSCULAR | Status: AC
  Filled 2018-01-15: qty 5

## 2018-01-15 MED ORDER — SODIUM CHLORIDE 0.9 % IV SOLN
INTRAVENOUS | Status: DC | PRN
  Administered 2018-01-15: 15 ug/min via INTRAVENOUS

## 2018-01-15 MED ORDER — DEXAMETHASONE SODIUM PHOSPHATE 10 MG/ML IJ SOLN
INTRAMUSCULAR | Status: DC | PRN
  Administered 2018-01-15: 10 mg via INTRAVENOUS

## 2018-01-15 MED ORDER — IBUPROFEN 100 MG/5ML PO SUSP
400.0000 mg | Freq: Four times a day (QID) | ORAL | Status: DC | PRN
  Filled 2018-01-15: qty 20

## 2018-01-15 MED ORDER — IBUPROFEN 800 MG PO TABS: 800.0000 mg | ORAL_TABLET | Freq: Every evening | ORAL | Status: DC | PRN

## 2018-01-15 MED ORDER — LIDOCAINE 2% (20 MG/ML) 5 ML SYRINGE
INTRAMUSCULAR | Status: DC | PRN
  Administered 2018-01-15: 60 mg via INTRAVENOUS

## 2018-01-15 MED ORDER — CEFAZOLIN SODIUM-DEXTROSE 2-4 GM/100ML-% IV SOLN
2.0000 g | INTRAVENOUS | Status: AC
  Administered 2018-01-15: 2 g via INTRAVENOUS

## 2018-01-15 MED ORDER — CEFAZOLIN SODIUM-DEXTROSE 1-4 GM/50ML-% IV SOLN
1.0000 g | Freq: Three times a day (TID) | INTRAVENOUS | Status: DC
  Administered 2018-01-15 – 2018-01-16 (×2): 1 g via INTRAVENOUS
  Filled 2018-01-15 (×3): qty 50

## 2018-01-15 MED ORDER — ONDANSETRON HCL 4 MG/2ML IJ SOLN
4.0000 mg | Freq: Once | INTRAMUSCULAR | Status: AC
  Administered 2018-01-15: 4 mg via INTRAVENOUS
  Filled 2018-01-15: qty 2

## 2018-01-15 MED ORDER — ROCURONIUM BROMIDE 50 MG/5ML IV SOSY
PREFILLED_SYRINGE | INTRAVENOUS | Status: AC
  Filled 2018-01-15: qty 10

## 2018-01-15 MED ORDER — DEXAMETHASONE SODIUM PHOSPHATE 10 MG/ML IJ SOLN
INTRAMUSCULAR | Status: AC
  Filled 2018-01-15: qty 1

## 2018-01-15 MED ORDER — SUGAMMADEX SODIUM 200 MG/2ML IV SOLN
INTRAVENOUS | Status: DC | PRN
  Administered 2018-01-15: 200 mg via INTRAVENOUS

## 2018-01-15 MED ORDER — PROPOFOL 10 MG/ML IV BOLUS
INTRAVENOUS | Status: DC | PRN
  Administered 2018-01-15: 110 mg via INTRAVENOUS

## 2018-01-15 MED ORDER — BACITRACIN ZINC 500 UNIT/GM EX OINT
TOPICAL_OINTMENT | CUTANEOUS | Status: AC
  Filled 2018-01-15: qty 28.35

## 2018-01-15 MED ORDER — LEVOTHYROXINE SODIUM 75 MCG PO TABS
75.0000 ug | ORAL_TABLET | Freq: Every day | ORAL | Status: DC
  Administered 2018-01-16: 75 ug via ORAL
  Filled 2018-01-15: qty 1

## 2018-01-15 MED ORDER — LACTATED RINGERS IV SOLN
INTRAVENOUS | Status: DC | PRN
  Administered 2018-01-15 (×2): via INTRAVENOUS

## 2018-01-15 MED ORDER — FENTANYL CITRATE (PF) 250 MCG/5ML IJ SOLN
INTRAMUSCULAR | Status: DC | PRN
  Administered 2018-01-15: 25 ug via INTRAVENOUS
  Administered 2018-01-15 (×4): 50 ug via INTRAVENOUS
  Administered 2018-01-15 (×2): 25 ug via INTRAVENOUS
  Administered 2018-01-15: 50 ug via INTRAVENOUS

## 2018-01-15 MED ORDER — SUCCINYLCHOLINE CHLORIDE 200 MG/10ML IV SOSY
PREFILLED_SYRINGE | INTRAVENOUS | Status: AC
  Filled 2018-01-15: qty 10

## 2018-01-15 MED ORDER — CEFAZOLIN SODIUM-DEXTROSE 2-4 GM/100ML-% IV SOLN
INTRAVENOUS | Status: AC
  Filled 2018-01-15: qty 100

## 2018-01-15 MED ORDER — SUGAMMADEX SODIUM 200 MG/2ML IV SOLN
INTRAVENOUS | Status: AC
  Filled 2018-01-15: qty 2

## 2018-01-15 MED ORDER — ONDANSETRON HCL 4 MG/2ML IJ SOLN
INTRAMUSCULAR | Status: AC
  Filled 2018-01-15: qty 2

## 2018-01-15 MED ORDER — PHENYLEPHRINE 40 MCG/ML (10ML) SYRINGE FOR IV PUSH (FOR BLOOD PRESSURE SUPPORT)
PREFILLED_SYRINGE | INTRAVENOUS | Status: DC | PRN
  Administered 2018-01-15 (×2): 80 ug via INTRAVENOUS

## 2018-01-15 MED ORDER — OXYMETAZOLINE HCL 0.05 % NA SOLN
NASAL | Status: AC
  Filled 2018-01-15: qty 15

## 2018-01-15 MED ORDER — ARTIFICIAL TEARS OPHTHALMIC OINT
TOPICAL_OINTMENT | OPHTHALMIC | Status: AC
  Filled 2018-01-15: qty 3.5

## 2018-01-15 MED ORDER — TRIAMTERENE-HCTZ 75-50 MG PO TABS
1.0000 | ORAL_TABLET | Freq: Every day | ORAL | Status: DC
  Administered 2018-01-16: 1 via ORAL
  Filled 2018-01-15: qty 1

## 2018-01-15 MED ORDER — ACETAMINOPHEN 500 MG PO TABS: 500.0000 mg | ORAL_TABLET | Freq: Every evening | ORAL | Status: DC | PRN

## 2018-01-15 MED ORDER — DIPHENHYDRAMINE HCL 25 MG PO CAPS: 25.0000 mg | ORAL_CAPSULE | Freq: Every evening | ORAL | Status: DC | PRN

## 2018-01-15 SURGICAL SUPPLY — 50 items
ATTRACTOMAT 16X20 MAGNETIC DRP (DRAPES) IMPLANT
BLADE 10 SAFETY STRL DISP (BLADE) IMPLANT
BLADE SURG 15 STRL LF DISP TIS (BLADE) ×2 IMPLANT
BLADE SURG 15 STRL SS (BLADE) ×4
CLEANER TIP ELECTROSURG 2X2 (MISCELLANEOUS) ×3 IMPLANT
CONT SPEC 4OZ CLIKSEAL STRL BL (MISCELLANEOUS) ×3 IMPLANT
CORD BIPOLAR FORCEPS 12FT (ELECTRODE) ×3 IMPLANT
COVER BACK TABLE 60X90IN (DRAPES) IMPLANT
COVER WAND RF STERILE (DRAPES) ×3 IMPLANT
DRAPE HALF SHEET 40X57 (DRAPES) ×9 IMPLANT
ELECT COATED BLADE 2.86 ST (ELECTRODE) ×3 IMPLANT
ELECT REM PT RETURN 9FT ADLT (ELECTROSURGICAL) ×3
ELECTRODE REM PT RTRN 9FT ADLT (ELECTROSURGICAL) ×1 IMPLANT
GAUZE 4X4 16PLY RFD (DISPOSABLE) IMPLANT
GAUZE SPONGE 4X4 12PLY STRL (GAUZE/BANDAGES/DRESSINGS) IMPLANT
GLOVE SS BIOGEL STRL SZ 7.5 (GLOVE) ×1 IMPLANT
GLOVE SUPERSENSE BIOGEL SZ 7.5 (GLOVE) ×2
GOWN STRL REUS W/ TWL LRG LVL3 (GOWN DISPOSABLE) ×2 IMPLANT
GOWN STRL REUS W/ TWL XL LVL3 (GOWN DISPOSABLE) ×1 IMPLANT
GOWN STRL REUS W/TWL LRG LVL3 (GOWN DISPOSABLE) ×4
GOWN STRL REUS W/TWL XL LVL3 (GOWN DISPOSABLE) ×2
GUARD TEETH (MISCELLANEOUS) IMPLANT
IMPL STRAVIX 3X6 (Tissue) ×1 IMPLANT
IMPLANT STRAVIX 3X6 (Tissue) ×3 IMPLANT
KIT BASIN OR (CUSTOM PROCEDURE TRAY) ×3 IMPLANT
KIT TURNOVER KIT B (KITS) ×3 IMPLANT
LOCATOR NERVE 3 VOLT (DISPOSABLE) IMPLANT
NEEDLE HYPO 25GX1X1/2 BEV (NEEDLE) IMPLANT
NS IRRIG 1000ML POUR BTL (IV SOLUTION) ×3 IMPLANT
PAD ARMBOARD 7.5X6 YLW CONV (MISCELLANEOUS) ×6 IMPLANT
PENCIL FOOT CONTROL (ELECTRODE) ×3 IMPLANT
SLEEVE SCD COMPRESS KNEE MED (MISCELLANEOUS) ×3 IMPLANT
SPECIMEN JAR SMALL (MISCELLANEOUS) ×3 IMPLANT
SPONGE INTESTINAL PEANUT (DISPOSABLE) IMPLANT
SURGILUBE 2OZ TUBE FLIPTOP (MISCELLANEOUS) IMPLANT
SUT CHROMIC 3 0 PS 2 (SUTURE) ×6 IMPLANT
SUT SILK 2 0 PERMA HAND 18 BK (SUTURE) IMPLANT
SUT SILK 3 0 (SUTURE)
SUT SILK 3 0 SH CR/8 (SUTURE) IMPLANT
SUT SILK 3-0 18XBRD TIE 12 (SUTURE) IMPLANT
SUT SILK 4 0 (SUTURE)
SUT SILK 4-0 18XBRD TIE 12 (SUTURE) IMPLANT
SUT VIC AB 3-0 SH 27 (SUTURE) ×2
SUT VIC AB 3-0 SH 27XBRD (SUTURE) ×1 IMPLANT
SUT VIC AB 4-0 RB1 18 (SUTURE) ×3 IMPLANT
TOWEL OR 17X24 6PK STRL BLUE (TOWEL DISPOSABLE) IMPLANT
TRAY ENT MC OR (CUSTOM PROCEDURE TRAY) ×3 IMPLANT
TUBE CONNECTING 12'X1/4 (SUCTIONS)
TUBE CONNECTING 12X1/4 (SUCTIONS) IMPLANT
WATER STERILE IRR 1000ML POUR (IV SOLUTION) ×3 IMPLANT

## 2018-01-15 NOTE — Interval H&P Note (Signed)
History and Physical Interval Note:  01/15/2018 1:30 PM  Stacy Wagner  has presented today for surgery, with the diagnosis of TONGUE CANCER  The various methods of treatment have been discussed with the patient and family. After consideration of risks, benefits and other options for treatment, the patient has consented to  Procedure(s): PARTIAL GLOSSECTOMY AND RIGHT MODIFIED NECK DISSECTION (N/A) as a surgical intervention .  The patient's history has been reviewed, patient examined, no change in status, stable for surgery.  I have reviewed the patient's chart and labs.  Questions were answered to the patient's satisfaction.     [name]

## 2018-01-15 NOTE — Anesthesia Procedure Notes (Signed)
Procedure Name: Intubation Date/Time: 01/15/2018 1:40 PM Performed by: Jearld Pies, CRNA Pre-anesthesia Checklist: Patient identified, Emergency Drugs available, Suction available and Patient being monitored Patient Re-evaluated:Patient Re-evaluated prior to induction Oxygen Delivery Method: Circle System Utilized Preoxygenation: Pre-oxygenation with 100% oxygen Induction Type: IV induction Ventilation: Mask ventilation without difficulty Laryngoscope Size: Miller and 2 Grade View: Grade I Tube type: Oral Rae Tube size: 7.0 mm Number of attempts: 1 Airway Equipment and Method: Stylet and Oral airway Placement Confirmation: ETT inserted through vocal cords under direct vision,  positive ETCO2 and breath sounds checked- equal and bilateral Secured at: 22 cm Tube secured with: Tape Dental Injury: Teeth and Oropharynx as per pre-operative assessment

## 2018-01-15 NOTE — Anesthesia Postprocedure Evaluation (Signed)
Anesthesia Post Note  Patient: MAZIYAH VESSEL  Procedure(s) Performed: PARTIAL GLOSSECTOMY (N/A )     Patient location during evaluation: PACU Anesthesia Type: General Level of consciousness: awake and alert Pain management: pain level controlled Vital Signs Assessment: post-procedure vital signs reviewed and stable Respiratory status: spontaneous breathing, nonlabored ventilation, respiratory function stable and patient connected to nasal cannula oxygen Cardiovascular status: blood pressure returned to baseline and stable Postop Assessment: no apparent nausea or vomiting Anesthetic complications: no    Last Vitals:  Vitals:   01/15/18 1600 01/15/18 1615  BP: (!) 153/72 (!) 144/73  Pulse: 92 88  Resp: 19 12  Temp:    SpO2: 99% 99%    Last Pain:  Vitals:   01/15/18 1615  TempSrc:   PainSc: 0-No pain                 Agastya Meister,W. EDMOND

## 2018-01-15 NOTE — Anesthesia Preprocedure Evaluation (Signed)
Anesthesia Evaluation  Patient identified by MRN, date of birth, ID band Patient awake    Reviewed: Allergy & Precautions, H&P , NPO status , Patient's Chart, lab work & pertinent test results  History of Anesthesia Complications (+) PONV  Airway Mallampati: II  TM Distance: >3 FB Neck ROM: Full    Dental no notable dental hx. (+) Teeth Intact, Dental Advisory Given   Pulmonary neg pulmonary ROS,    Pulmonary exam normal breath sounds clear to auscultation       Cardiovascular hypertension,  Rhythm:Regular Rate:Normal     Neuro/Psych  Headaches, negative psych ROS   GI/Hepatic Neg liver ROS, GERD  Medicated and Controlled,  Endo/Other  Hypothyroidism   Renal/GU negative Renal ROS  negative genitourinary   Musculoskeletal   Abdominal   Peds  Hematology negative hematology ROS (+)   Anesthesia Other Findings   Reproductive/Obstetrics negative OB ROS                             Anesthesia Physical Anesthesia Plan  ASA: II  Anesthesia Plan: General   Post-op Pain Management:    Induction: Intravenous  PONV Risk Score and Plan: 4 or greater and Ondansetron, Dexamethasone and Treatment may vary due to age or medical condition  Airway Management Planned: Oral ETT  Additional Equipment:   Intra-op Plan:   Post-operative Plan: Extubation in OR  Informed Consent: I have reviewed the patients History and Physical, chart, labs and discussed the procedure including the risks, benefits and alternatives for the proposed anesthesia with the patient or authorized representative who has indicated his/her understanding and acceptance.   Dental advisory given  Plan Discussed with: CRNA  Anesthesia Plan Comments:         Anesthesia Quick Evaluation

## 2018-01-15 NOTE — Transfer of Care (Signed)
Immediate Anesthesia Transfer of Care Note  Patient: Stacy Wagner  Procedure(s) Performed: PARTIAL GLOSSECTOMY (N/A )  Patient Location: PACU  Anesthesia Type:General  Level of Consciousness: sedated, patient cooperative and responds to stimulation  Airway & Oxygen Therapy: Patient Spontanous Breathing and Patient connected to nasal cannula oxygen  Post-op Assessment: Report given to RN and Post -op Vital signs reviewed and stable  Post vital signs: Reviewed and stable  Last Vitals:  Vitals Value Taken Time  BP 168/87   Temp    Pulse 93   Resp 15   SpO2 99     Last Pain:  Vitals:   01/15/18 1205  TempSrc:   PainSc: 0-No pain         Complications: No apparent anesthesia complications

## 2018-01-15 NOTE — Brief Op Note (Signed)
01/15/2018  3:37 PM  PATIENT:  [name]  77 y.o. female  PRE-OPERATIVE DIAGNOSIS:  TONGUE CANCER  POST-OPERATIVE DIAGNOSIS:  TONGUE CANCER  PROCEDURE:  Procedure(s): PARTIAL GLOSSECTOMY (N/A) with placement of Stravix   SURGEON:  Surgeon(s) and Role:    * Rozetta Nunnery, MD - Primary  PHYSICIAN ASSISTANT:   ASSISTANTS: none   ANESTHESIA:   none General  EBL:  10 mL   BLOOD ADMINISTERED:none  DRAINS: none   LOCAL MEDICATIONS USED:  NONE  SPECIMEN:  Source of Specimen:  right lateral tongue  DISPOSITION OF SPECIMEN:  PATHOLOGY  COUNTS:  YES  TOURNIQUET:  * No tourniquets in log *  DICTATION: .Other Dictation: Dictation Number [id number]  PLAN OF CARE: Admit for overnight observation  PATIENT DISPOSITION:  PACU - hemodynamically stable.   Delay start of Pharmacological VTE agent (>24hrs) due to surgical blood loss or risk of bleeding: yes

## 2018-01-15 NOTE — Op Note (Signed)
NAME: Stacy Wagner, Stacy Wagner MEDICAL RECORD [id number] ACCOUNT [id number] DATE OF BIRTH:[birth year] FACILITY: [facility] LOCATION: [facility] PHYSICIAN:CHRISTOPHER Lincoln Maxin, MD  OPERATIVE REPORT  DATE OF PROCEDURE:  01/15/2018  PREOPERATIVE DIAGNOSIS:  Squamous cell carcinoma of the right lateral tongue.  POSTOPERATIVE DIAGNOSIS:  Squamous cell carcinoma of the right lateral tongue.  OPERATION:  Partial right glossectomy with placement of Strabix graft.  SURGEON:  [name], MD  ANESTHESIA:  General endotracheal.  COMPLICATIONS:  None.  BRIEF CLINICAL NOTE:  the patient is a 77 year old female who has had chronic dysplasia on the right lateral tongue for several years.  More recently, it has become tender and a biopsy by oral surgery demonstrated invasive squamous cell carcinoma.  On  exam, she has dysplasia with slight induration on the midportion of dysplasia where previous biopsy was performed.  A CT scan demonstrated no significant adenopathy in the neck and the tongue cancer could not visualize on a CT scan.  The patient is taken  to the operating room this time for a wide local excision, partial glossectomy of the cancer.  DESCRIPTION OF PROCEDURE:  The patient was brought to the operating room and underwent general endotracheal anesthesia.  The tongue was exposed using a bite blocks and a _____  to retract the lips.  The dysplasia extended from the mid lateral tongue back  posteriorly.  Just short of the anterior tonsillar pillar.  The previous biopsy site extended close to the dorsum of the tongue, but was just a couple of millimeters short of the dorsum of the tongue.  Wide excision of the dysplasia that appeared  grossly was marked out.  This included approximate 6-8 mm depth at the midportion of dysplasia where the previous biopsy was obtained.  The specimen was excised and marked with a long suture anteriorly, short suture superiorly and medially sutures  posteriorly and long  suture inferiorly.  In addition, a small sliver of superior margin was reexcised because of the proximity of the excision to the superior margin.  This was sent as a separate specimen as a superior margin.  Hemostasis was obtained  with cautery and bipolar.  After obtaining adequate hemostasis, a graft of Strabix was utilized and secured to the right lateral tongue with 4-0 Vicryl sutures around the periphery and several mid sutures to hold it deep and centrally to the tongue.  The  Strabix graft was perforated with a scalpel.  Prior to placing the graft over the right lateral tongue.  The patient had minimal bleeding throughout the procedure.  She had minimal swelling of the tongue, was awoke from anesthesia with no airway  problems and was transferred to recovery room  postoperatively doing well.  DISPOSITION:  The patient will be observed overnight in the hospital and planned discharge tomorrow on hydrocodone elixir and amoxicillin for antibiotic and I will follow up in my office in 1 week for recheck.  AN/NUANCE  D:01/15/2018 T:01/15/2018 JOB:[id number]

## 2018-01-15 NOTE — Progress Notes (Signed)
2000 spoke with pharmacy, they do not have Estratest on site. Asked patient to bring from home and take in moring.  Patient's husband will bring per patient.

## 2018-01-16 ENCOUNTER — Encounter (HOSPITAL_COMMUNITY): Payer: Self-pay | Admitting: Otolaryngology

## 2018-01-16 DIAGNOSIS — I10 Essential (primary) hypertension: Secondary | ICD-10-CM | POA: Diagnosis not present

## 2018-01-16 DIAGNOSIS — K219 Gastro-esophageal reflux disease without esophagitis: Secondary | ICD-10-CM | POA: Diagnosis not present

## 2018-01-16 DIAGNOSIS — M719 Bursopathy, unspecified: Secondary | ICD-10-CM | POA: Diagnosis not present

## 2018-01-16 DIAGNOSIS — E039 Hypothyroidism, unspecified: Secondary | ICD-10-CM | POA: Diagnosis not present

## 2018-01-16 DIAGNOSIS — H9193 Unspecified hearing loss, bilateral: Secondary | ICD-10-CM | POA: Diagnosis not present

## 2018-01-16 DIAGNOSIS — C029 Malignant neoplasm of tongue, unspecified: Secondary | ICD-10-CM | POA: Diagnosis not present

## 2018-01-16 MED ORDER — CEPHALEXIN 500 MG PO CAPS: 500.0000 mg | ORAL_CAPSULE | Freq: Two times a day (BID) | ORAL | 0 refills | Status: AC

## 2018-01-16 MED ORDER — MECLIZINE HCL 25 MG PO TABS: 25.0000 mg | ORAL_TABLET | Freq: Three times a day (TID) | ORAL | 0 refills | Status: DC | PRN

## 2018-01-16 NOTE — Discharge Planning (Signed)
Patient discharged home in stable condition. Verbalizes understanding of all discharge instructions, including home medications and follow up appointments. 

## 2018-01-16 NOTE — Progress Notes (Signed)
Patient POD 1 AF VSS Doing well with minimal complaints of pain. C/o of dizziness last night. No bleeding or airway problems Stravix graft inatact with no significant tongue swelling Discharge to home Follow up in 6 days. Meds include keflex 500 mg bid for 1 week and meclizine prn dizziness.

## 2018-01-16 NOTE — Discharge Instructions (Signed)
Tylenol, ibuprofen or hydrocodone tabs every 6 hrs prn pain Keflex 500 mg bid for the next week Soft diet Topical anesthetic as needed for pain as prescribed by Dr Romie Minus Return to see Dr Lucia Gaskins next Wednesday at 11 am

## 2018-01-18 NOTE — Discharge Summary (Signed)
NAME: Stacy Wagner, Stacy Wagner MEDICAL RECORD [id number] ACCOUNT [id number] DATE OF BIRTH:[birth year] FACILITY[facility] LOCATION: [facility] PHYSICIAN:CHRISTOPHER Lincoln Maxin, MD  DISCHARGE SUMMARY  DATE OF DISCHARGE:  01/16/2018  ADMISSION DIAGNOSIS:  T1 N0 squamous cell carcinoma of the right lateral tongue.  OPERATION DURING THIS HOSPITALIZATION:  Right partial glossectomy on 01/15/2018.  HOSPITAL COURSE:  The patient was admitted to the operating room on 01/15/2018 at which time she underwent a right partial glossectomy for a T1 N0 squamous cell carcinoma of the right lateral tongue.  She had a graft placed after partial glossectomy.   She received preoperative and postoperative antibiotic Ancef 1 g IV q.8.  She remained afebrile and vital signs stable throughout the hospital course.  The patient was drinking and talking well the following day after surgery with no significant swelling  or airway problems.  She was afebrile.  Wound was healing nicely.  The patient was subsequently discharged home on first postoperative day.  DISPOSITION:  The patient was discharged home and will follow up in my office in 1 week for recheck.  She was prescribed amoxicillin 500 mg b.i.d. for a week along with hydrocodone elixir 2-3 teaspoons q.6 hours p.r.n. pain.  She was also instructed to  take Tylenol and ibuprofen for pain.  LN/NUANCE D:01/18/2018 T:01/18/2018 JOB:[id number]

## 2018-02-04 DIAGNOSIS — L820 Inflamed seborrheic keratosis: Secondary | ICD-10-CM | POA: Diagnosis not present

## 2018-02-04 DIAGNOSIS — Z85828 Personal history of other malignant neoplasm of skin: Secondary | ICD-10-CM | POA: Diagnosis not present

## 2018-02-04 DIAGNOSIS — L821 Other seborrheic keratosis: Secondary | ICD-10-CM | POA: Diagnosis not present

## 2018-02-04 DIAGNOSIS — L570 Actinic keratosis: Secondary | ICD-10-CM | POA: Diagnosis not present

## 2018-03-06 DIAGNOSIS — H02831 Dermatochalasis of right upper eyelid: Secondary | ICD-10-CM | POA: Diagnosis not present

## 2018-03-06 DIAGNOSIS — H40013 Open angle with borderline findings, low risk, bilateral: Secondary | ICD-10-CM | POA: Diagnosis not present

## 2018-03-06 DIAGNOSIS — H25813 Combined forms of age-related cataract, bilateral: Secondary | ICD-10-CM | POA: Diagnosis not present

## 2018-03-06 DIAGNOSIS — H02834 Dermatochalasis of left upper eyelid: Secondary | ICD-10-CM | POA: Diagnosis not present

## 2018-03-06 DIAGNOSIS — H524 Presbyopia: Secondary | ICD-10-CM | POA: Diagnosis not present

## 2018-03-06 DIAGNOSIS — H02832 Dermatochalasis of right lower eyelid: Secondary | ICD-10-CM | POA: Diagnosis not present

## 2018-04-01 ENCOUNTER — Other Ambulatory Visit: Payer: Self-pay | Admitting: Family Medicine

## 2018-05-02 DIAGNOSIS — C021 Malignant neoplasm of border of tongue: Secondary | ICD-10-CM | POA: Diagnosis not present

## 2018-05-13 ENCOUNTER — Other Ambulatory Visit: Payer: Self-pay | Admitting: Family Medicine

## 2018-05-26 DIAGNOSIS — D225 Melanocytic nevi of trunk: Secondary | ICD-10-CM | POA: Diagnosis not present

## 2018-05-26 DIAGNOSIS — L814 Other melanin hyperpigmentation: Secondary | ICD-10-CM | POA: Diagnosis not present

## 2018-05-26 DIAGNOSIS — L821 Other seborrheic keratosis: Secondary | ICD-10-CM | POA: Diagnosis not present

## 2018-05-26 DIAGNOSIS — L570 Actinic keratosis: Secondary | ICD-10-CM | POA: Diagnosis not present

## 2018-05-26 DIAGNOSIS — L550 Sunburn of first degree: Secondary | ICD-10-CM | POA: Diagnosis not present

## 2018-05-26 DIAGNOSIS — D1801 Hemangioma of skin and subcutaneous tissue: Secondary | ICD-10-CM | POA: Diagnosis not present

## 2018-05-26 DIAGNOSIS — D2262 Melanocytic nevi of left upper limb, including shoulder: Secondary | ICD-10-CM | POA: Diagnosis not present

## 2018-06-16 ENCOUNTER — Telehealth: Payer: Self-pay | Admitting: *Deleted

## 2018-06-16 NOTE — Telephone Encounter (Signed)
Left message to scheudle awv

## 2018-08-05 ENCOUNTER — Other Ambulatory Visit: Payer: Self-pay | Admitting: Otolaryngology

## 2018-08-05 DIAGNOSIS — C029 Malignant neoplasm of tongue, unspecified: Secondary | ICD-10-CM

## 2018-08-25 ENCOUNTER — Ambulatory Visit
Admission: RE | Admit: 2018-08-25 | Discharge: 2018-08-25 | Disposition: A | Discharge status: Home / Self Care | Payer: Medicare Other | Source: Ambulatory Visit | Attending: Otolaryngology | Admitting: Otolaryngology

## 2018-08-25 DIAGNOSIS — C021 Malignant neoplasm of border of tongue: Secondary | ICD-10-CM | POA: Diagnosis not present

## 2018-08-25 DIAGNOSIS — C029 Malignant neoplasm of tongue, unspecified: Secondary | ICD-10-CM

## 2018-08-25 MED ORDER — IOPAMIDOL (ISOVUE-300) INJECTION 61%
75.0000 mL | Freq: Once | INTRAVENOUS | Status: AC | PRN
  Administered 2018-08-25: 75 mL via INTRAVENOUS

## 2018-08-28 DIAGNOSIS — Z85810 Personal history of malignant neoplasm of tongue: Secondary | ICD-10-CM | POA: Diagnosis not present

## 2018-09-04 DIAGNOSIS — H02834 Dermatochalasis of left upper eyelid: Secondary | ICD-10-CM | POA: Diagnosis not present

## 2018-09-04 DIAGNOSIS — H524 Presbyopia: Secondary | ICD-10-CM | POA: Diagnosis not present

## 2018-09-04 DIAGNOSIS — H40013 Open angle with borderline findings, low risk, bilateral: Secondary | ICD-10-CM | POA: Diagnosis not present

## 2018-09-04 DIAGNOSIS — H25813 Combined forms of age-related cataract, bilateral: Secondary | ICD-10-CM | POA: Diagnosis not present

## 2018-09-04 DIAGNOSIS — H02831 Dermatochalasis of right upper eyelid: Secondary | ICD-10-CM | POA: Diagnosis not present

## 2018-09-22 DIAGNOSIS — H2511 Age-related nuclear cataract, right eye: Secondary | ICD-10-CM | POA: Diagnosis not present

## 2018-09-22 DIAGNOSIS — H2513 Age-related nuclear cataract, bilateral: Secondary | ICD-10-CM | POA: Diagnosis not present

## 2018-09-24 ENCOUNTER — Other Ambulatory Visit: Payer: Self-pay

## 2018-09-29 DIAGNOSIS — H2511 Age-related nuclear cataract, right eye: Secondary | ICD-10-CM | POA: Diagnosis not present

## 2018-09-29 DIAGNOSIS — H25811 Combined forms of age-related cataract, right eye: Secondary | ICD-10-CM | POA: Diagnosis not present

## 2018-10-07 DIAGNOSIS — H2512 Age-related nuclear cataract, left eye: Secondary | ICD-10-CM | POA: Diagnosis not present

## 2018-10-13 ENCOUNTER — Ambulatory Visit: Payer: Medicare Other | Admitting: Family Medicine

## 2018-10-20 DIAGNOSIS — H25812 Combined forms of age-related cataract, left eye: Secondary | ICD-10-CM | POA: Diagnosis not present

## 2018-10-20 DIAGNOSIS — H2512 Age-related nuclear cataract, left eye: Secondary | ICD-10-CM | POA: Diagnosis not present

## 2018-10-21 ENCOUNTER — Ambulatory Visit: Payer: Self-pay

## 2018-10-21 ENCOUNTER — Encounter: Payer: Self-pay | Admitting: Family Medicine

## 2018-10-21 ENCOUNTER — Ambulatory Visit (INDEPENDENT_AMBULATORY_CARE_PROVIDER_SITE_OTHER): Payer: Medicare Other | Admitting: Family Medicine

## 2018-10-21 ENCOUNTER — Other Ambulatory Visit: Payer: Self-pay

## 2018-10-21 ENCOUNTER — Encounter: Payer: Self-pay | Admitting: Internal Medicine

## 2018-10-21 VITALS — BP 116/68 | HR 91 | Temp 97.8°F | Ht 66.5 in | Wt 150.5 lb

## 2018-10-21 DIAGNOSIS — Z0000 Encounter for general adult medical examination without abnormal findings: Secondary | ICD-10-CM

## 2018-10-21 DIAGNOSIS — E039 Hypothyroidism, unspecified: Secondary | ICD-10-CM | POA: Diagnosis not present

## 2018-10-21 DIAGNOSIS — D126 Benign neoplasm of colon, unspecified: Secondary | ICD-10-CM | POA: Diagnosis not present

## 2018-10-21 DIAGNOSIS — E785 Hyperlipidemia, unspecified: Secondary | ICD-10-CM

## 2018-10-21 DIAGNOSIS — K219 Gastro-esophageal reflux disease without esophagitis: Secondary | ICD-10-CM

## 2018-10-21 DIAGNOSIS — I10 Essential (primary) hypertension: Secondary | ICD-10-CM

## 2018-10-21 LAB — HEPATIC FUNCTION PANEL
ALT: 16 U/L (ref 0–35)
AST: 22 U/L (ref 0–37)
Albumin: 4.5 g/dL (ref 3.5–5.2)
Alkaline Phosphatase: 74 U/L (ref 39–117)
Bilirubin, Direct: 0.1 mg/dL (ref 0.0–0.3)
Total Bilirubin: 0.4 mg/dL (ref 0.2–1.2)
Total Protein: 7.1 g/dL (ref 6.0–8.3)

## 2018-10-21 LAB — BASIC METABOLIC PANEL
BUN: 24 mg/dL — ABNORMAL HIGH (ref 6–23)
CO2: 29 mEq/L (ref 19–32)
Calcium: 9.4 mg/dL (ref 8.4–10.5)
Chloride: 103 mEq/L (ref 96–112)
Creatinine, Ser: 0.79 mg/dL (ref 0.40–1.20)
GFR: 70.76 mL/min (ref >60.00)
Glucose, Bld: 83 mg/dL (ref 70–99)
Potassium: 3.8 mEq/L (ref 3.5–5.1)
Sodium: 141 mEq/L (ref 135–145)

## 2018-10-21 LAB — LIPID PANEL
Cholesterol: 206 mg/dL — ABNORMAL HIGH (ref 0–200)
HDL: 45.3 mg/dL (ref >39.00)
LDL Cholesterol: 140 mg/dL — ABNORMAL HIGH (ref 0–99)
NonHDL: 160.38
Total CHOL/HDL Ratio: 5
Triglycerides: 100 mg/dL (ref 0.0–149.0)
VLDL: 20 mg/dL (ref 0.0–40.0)

## 2018-10-21 LAB — TSH: TSH: 1.57 u[IU]/mL (ref 0.35–4.50)

## 2018-10-21 NOTE — Telephone Encounter (Signed)
Provided lab results to Patient voiced understanding.  No questions noted.

## 2018-10-21 NOTE — Telephone Encounter (Signed)
Noted! Thank you

## 2018-10-21 NOTE — Patient Instructions (Signed)
Advance Directive  Advance directives are legal documents that let you make choices ahead of time about your health care and medical treatment in case you become unable to communicate for yourself. Advance directives are a way for you to communicate your wishes to family, friends, and health care providers. This can help convey your decisions about end-of-life care if you become unable to communicate. Discussing and writing advance directives should happen over time rather than all at once. Advance directives can be changed depending on your situation and what you want, even after you have signed the advance directives. If you do not have an advance directive, some states assign family decision makers to act on your behalf based on how closely you are related to them. Each state has its own laws regarding advance directives. You may want to check with your health care provider, attorney, or state representative about the laws in your state. There are different types of advance directives, such as:  Medical power of attorney.  Living will.  Do not resuscitate (DNR) or do not attempt resuscitation (DNAR) order. Health care proxy and medical power of attorney A health care proxy, also called a health care agent, is a person who is appointed to make medical decisions for you in cases in which you are unable to make the decisions yourself. Generally, people choose someone they know well and trust to represent their preferences. Make sure to ask this person for an agreement to act as your proxy. A proxy may have to exercise judgment in the event of a medical decision for which your wishes are not known. A medical power of attorney is a legal document that names your health care proxy. Depending on the laws in your state, after the document is written, it may also need to be:  Signed.  Notarized.  Dated.  Copied.  Witnessed.  Incorporated into your medical record. You may also want to appoint  someone to manage your financial affairs in a situation in which you are unable to do so. This is called a durable power of attorney for finances. It is a separate legal document from the durable power of attorney for health care. You may choose the same person or someone different from your health care proxy to act as your agent in financial matters. If you do not appoint a proxy, or if there is a concern that the proxy is not acting in your best interests, a court-appointed guardian may be designated to act on your behalf. Living will A living will is a set of instructions documenting your wishes about medical care when you cannot express them yourself. Health care providers should keep a copy of your living will in your medical record. You may want to give a copy to family members or friends. To alert caregivers in case of an emergency, you can place a card in your wallet to let them know that you have a living will and where they can find it. A living will is used if you become:  Terminally ill.  Incapacitated.  Unable to communicate or make decisions. Items to consider in your living will include:  The use or non-use of life-sustaining equipment, such as dialysis machines and breathing machines (ventilators).  A DNR or DNAR order, which is the instruction not to use cardiopulmonary resuscitation (CPR) if breathing or heartbeat stops.  The use or non-use of tube feeding.  Withholding of food and fluids.  Comfort (palliative) care when the goal becomes comfort rather  than a cure.  Organ and tissue donation. A living will does not give instructions for distributing your money and property if you should pass away. It is recommended that you seek the advice of a lawyer when writing a will. Decisions about taxes, beneficiaries, and asset distribution will be legally binding. This process can relieve your family and friends of any concerns surrounding disputes or questions that may come up about  the distribution of your assets. DNR or DNAR A DNR or DNAR order is a request not to have CPR in the event that your heart stops beating or you stop breathing. If a DNR or DNAR order has not been made and shared, a health care provider will try to help any patient whose heart has stopped or who has stopped breathing. If you plan to have surgery, talk with your health care provider about how your DNR or DNAR order will be followed if problems occur. Summary  Advance directives are the legal documents that allow you to make choices ahead of time about your health care and medical treatment in case you become unable to communicate for yourself.  The process of discussing and writing advance directives should happen over time. You can change the advance directives, even after you have signed them.  Advance directives include DNR or DNAR orders, living wills, and designating an agent as your medical power of attorney. This information is not intended to replace advice given to you by your health care provider. Make sure you discuss any questions you have with your health care provider. Document Released: 05/22/2007 Document Revised: 03/19/2018 Document Reviewed: 01/02/2016 Elsevier Patient Education  [place].  We will set up repeat colonoscopy  Let me know if you change your mind about flu vaccine.

## 2018-10-21 NOTE — Progress Notes (Signed)
Subjective:     Patient ID: Stacy Wagner, female   DOB: 1940/06/03, 78 y.o.   MRN: [id number]  HPI Patient is here for subsequent annual Medicare wellness visit and medical follow-up.  She was diagnosed with tongue cancer last year and had surgery and had recent follow-up with ENT with follow-up CT scan which is shown no signs of recurrence.  She lost a lot of weight following her surgery and made some healthy lifestyle changes and has been basically maintaining her weight around 150 pounds past several weeks.  She is happy with her current weight.  Has good appetite.  Her chronic problems include hypothyroidism, GERD, hypertension, hyperlipidemia.  Takes Maxide for hypertension.  She is on thyroid replacement.  Compliant with medications.  Blood pressure well controlled.  No dizziness.  GERD symptoms controlled with over-the-counter Nexium. Denies any recent chest pains, dyspnea, cough, fever, abdominal pain.  Ongoing chronic back pain  Past Medical History:  Diagnosis Date  . AC (acromioclavicular) joint bone spurs   . Arthritis    "lower back" (01/15/2018)  . Bursitis disorder    bil shoulders  . Chronic lower back pain   . Complication of anesthesia    some nausea in past with anesthesia, pt. also recalls having the "tube removed too early & her her throat was full of mucous, states that she could move any part of her body)  . Family history of adverse reaction to anesthesia    "daughter gets PONV" (01/15/2018)  . GERD (gastroesophageal reflux disease)   . Hearing loss    bil / no hearing aids  . Hypertension   . Hypothyroidism   . Migraine    "used to have them all the time; none in 10 yrs" (01/15/2018)  . OA (osteoarthritis)    Multiple injections in lumbar region   . PONV (postoperative nausea and vomiting)   . Prolapse of female bladder, acquired   . Skin cancer 11/2017   "left thigh"  . Thyroid disease   . Tongue cancer [facility])    Past Surgical History:  Procedure  Laterality Date  . ABDOMINAL HYSTERECTOMY     uterine prolapse  . ABDOMINOPLASTY     tummy tuck Emilio Aspen excess skin arms and legs  . APPENDECTOMY    . BACK SURGERY    . BREAST SURGERY Right 1980   removal of a nodule- post trauma, benign pathology  . CARPAL TUNNEL RELEASE Right   . CATARACT EXTRACTION, BILATERAL    . GLOSSECTOMY N/A 01/15/2018   Procedure: PARTIAL GLOSSECTOMY;  Surgeon: Rozetta Nunnery, MD;  Location: [facility] OR;  Service: ENT;  Laterality: N/A;  . LUMBAR SPINE SURGERY  1988   /w Dr. Joya Salm- reports that it was in the lumbar region, unsure of date.  Marland Kitchen NASAL POLYP EXCISION    . PARTIAL GLOSSECTOMY  01/15/2018  . SKIN CANCER EXCISION Left 11/2017   "thigh"  . TONSILLECTOMY      reports that she has never smoked. She has never used smokeless tobacco. She reports that she does not drink alcohol or use drugs. family history includes Cancer in her mother; Diabetes in her father; Hypertension in her sister. Allergies  Allergen Reactions  . Codeine Nausea Only   1.  Risk factors based on Past Medical , Social, and Family history reviewed and as indicated above with no changes  2.  Limitations in physical activities None.  No recent falls.  Stays very active around the house.  She has been limited  with formal exercise such as walking because of some chronic back difficulties.  Feels very steady on her feet   3.  Depression/mood No active depression or anxiety issues PHQ 2 equals 0  4.  Hearing -she has chronic hearing loss and has bilateral hearing aids  5.  ADLs independent in all.  6.  Cognitive function (orientation to time and place, language, writing, speech,memory) no short or long term memory issues.  Language and judgement intact.  7.  Home Safety no issues  8.  Height, weight, and visual acuity-loss of weight as above.  This was initially unintentional following surgery within she has made intentional dietary changes to maintain her weight  9.   Counseling discussed patient has had previous adenomatous polyps and is overdue for colonoscopy.  She is willing to go back at this time.  She had 2 colon adenomas back in 2013  10. Recommendation of preventive services.  Recommend flu vaccine but she declines.  She has had previous Zostavax and we discussed Shingrix and she will check with pharmacy about that.  Pneumonia vaccines complete.  Needs follow-up mammogram which she will schedule.  Previous hysterectomy so no indication for Pap smears  11. Labs based on risk factors-TSH, lipid, hepatic, basic metabolic panel  12. Care Plan-as above  13. Other Providers-Dr. Radene Journey, ENT   Dr. Hilarie Fredrickson, GI  14. Written schedule of screening/prevention services given to patient. Health Maintenance  Topic Date Due  . COLONOSCOPY  11/27/2014  . TETANUS/TDAP  08/26/2018  . INFLUENZA VACCINE  10/15/2019 (Originally 09/27/2018)  . DEXA SCAN  Completed  . PNA vac Low Risk Adult  Completed   15 Advanced Directives.  She and her husband currently have none.  These were discussed with handout given.   Review of Systems  Constitutional: Negative for activity change, appetite change, fatigue, fever and unexpected weight change.  HENT: Negative for ear pain, hearing loss, sore throat and trouble swallowing.   Eyes: Negative for visual disturbance.  Respiratory: Negative for cough and shortness of breath.   Cardiovascular: Negative for chest pain and palpitations.  Gastrointestinal: Negative for abdominal pain, blood in stool, constipation and diarrhea.  Endocrine: Negative for polydipsia and polyuria.  Genitourinary: Negative for dysuria, hematuria and vaginal discharge.  Musculoskeletal: Negative for arthralgias, back pain and myalgias.  Skin: Negative for rash.  Neurological: Negative for dizziness, syncope, weakness and headaches.  Hematological: Negative for adenopathy. Does not bruise/bleed easily.  Psychiatric/Behavioral: Negative for confusion  and dysphoric mood.       Objective:   Physical Exam Constitutional:      Appearance: She is well-developed.  HENT:     Head: Normocephalic and atraumatic.     Mouth/Throat:     Comments: Previous surgery involving the right side of tongue.  No evidence for disease recurrence Eyes:     Pupils: Pupils are equal, round, and reactive to light.  Neck:     Musculoskeletal: Normal range of motion and neck supple.     Thyroid: No thyromegaly.     Comments: No anterior cervical or supraclavicular adenopathy Cardiovascular:     Rate and Rhythm: Normal rate and regular rhythm.     Heart sounds: Normal heart sounds. No murmur.  Pulmonary:     Effort: No respiratory distress.     Breath sounds: Normal breath sounds. No wheezing or rales.  Abdominal:     General: Bowel sounds are normal. There is no distension.     Palpations: Abdomen is soft.  There is no mass.     Tenderness: There is no abdominal tenderness. There is no guarding or rebound.  Musculoskeletal: Normal range of motion.  Lymphadenopathy:     Cervical: No cervical adenopathy.  Skin:    Findings: No rash.  Neurological:     Mental Status: She is alert and oriented to person, place, and time.     Cranial Nerves: No cranial nerve deficit.     Deep Tendon Reflexes: Reflexes normal.  Psychiatric:        Behavior: Behavior normal.        Thought Content: Thought content normal.        Judgment: Judgment normal.        Assessment:     #1 Medicare subsequent annual wellness visit.  We discussed health maintenance issues as below  #2 history of colon adenomas.  Patient is overdue for repeat colonoscopy and is willing to go at this time  #3 hypertension which is stable and at goal  #4 history of dyslipidemia  #5 hypothyroidism  #6 GERD which is controlled on over-the-counter Nexium    Plan:     -Check labs with TSH, lipid, hepatic, basic metabolic panel -Set up referral for repeat colonoscopy -Handout given  regarding advanced directives -She will check with pharmacist regarding coverage for Shingrix vaccine -Recommend flu vaccine but she declines -Set up repeat mammogram  Eulas Post MD [facility]

## 2018-10-27 ENCOUNTER — Other Ambulatory Visit: Payer: Self-pay | Admitting: Family Medicine

## 2018-10-27 NOTE — Telephone Encounter (Signed)
See request °

## 2018-10-27 NOTE — Telephone Encounter (Signed)
Requested medication (s) are due for refill today: yes  Requested medication (s) are on the active medication list: yes  Last refill:   Future visit scheduled: no  Notes to clinic:  Review for refill   Requested Prescriptions  Pending Prescriptions Disp Refills   estrogens-methylTEST (ESTRATEST) 1.25-2.5 MG tablet 90 tablet 1    Sig: Take 1 tablet by mouth daily.     Not Delegated - OB/GYN:  Hormone Combinations - Controlled Failed - 10/27/2018 10:04 AM      Failed - This refill cannot be delegated      Failed - Mammogram is up-to-date per Health Maintenance      Passed - Last BP in normal range    BP Readings from Last 1 Encounters:  10/21/18 116/68         Passed - Valid encounter within last 12 months    Recent Outpatient Visits          6 days ago Medicare annual wellness visit, subsequent   Therapist, [facility] at [facility], Alinda Sierras, MD   9 months ago Acute dysfunction of right eustachian tube   Therapist, [facility] at [facility], Alinda Sierras, MD   1 year ago Essential hypertension   Therapist, music at [facility], Alinda Sierras, MD   1 year ago Essential hypertension   Therapist, music at [facility], Alinda Sierras, MD   2 years ago Essential hypertension   Therapist, music at [facility], Alinda Sierras, MD              levothyroxine (SYNTHROID) 75 MCG tablet 90 tablet 3    Sig: TAKE 1 TABLET BY MOUTH EVERY DAY ON AN [place]     Endocrinology:  Hypothyroid Agents Failed - 10/27/2018 10:04 AM      Failed - TSH needs to be rechecked within 3 months after an abnormal result. Refill until TSH is due.      Passed - TSH in normal range and within 360 days    TSH  Date Value Ref Range Status  10/21/2018 1.57 0.35 - 4.50 uIU/mL Final         Passed - Valid encounter within last 12 months    Recent Outpatient Visits          6 days ago Medicare annual wellness visit, subsequent   Therapist, music at [facility],  Alinda Sierras, MD   9 months ago Acute dysfunction of right eustachian tube   Therapist, music at [facility], Alinda Sierras, MD   1 year ago Essential hypertension   Therapist, music at [facility], Alinda Sierras, MD   1 year ago Essential hypertension   Therapist, music at [facility], Alinda Sierras, MD   2 years ago Essential hypertension   Therapist, music at [facility], Alinda Sierras, MD              triamterene-hydrochlorothiazide (MAXZIDE) 75-50 MG tablet 90 tablet 0    Sig: Take 1 tablet by mouth daily with breakfast.     Cardiovascular: Diuretic Combos Passed - 10/27/2018 10:04 AM      Passed - K in normal range and within 360 days    Potassium  Date Value Ref Range Status  10/21/2018 3.8 3.5 - 5.1 mEq/L Final         Passed - Na in normal range and within 360 days    Sodium  Date Value Ref Range Status  10/21/2018 141 135 - 145 mEq/L Final  Passed - Cr in normal range and within 360 days    Creatinine, Ser  Date Value Ref Range Status  10/21/2018 0.79 0.40 - 1.20 mg/dL Final         Passed - Ca in normal range and within 360 days    Calcium  Date Value Ref Range Status  10/21/2018 9.4 8.4 - 10.5 mg/dL Final         Passed - Last BP in normal range    BP Readings from Last 1 Encounters:  10/21/18 116/68         Passed - Valid encounter within last 6 months    Recent Outpatient Visits          6 days ago Medicare annual wellness visit, subsequent   Therapist, music at [facility], Alinda Sierras, MD   9 months ago Acute dysfunction of right eustachian tube   Therapist, music at [facility], Alinda Sierras, MD   1 year ago Essential hypertension   Therapist, music at [facility], Alinda Sierras, MD   1 year ago Essential hypertension   Therapist, music at [facility], Alinda Sierras, MD   2 years ago Essential hypertension   Therapist, music at [facility], Alinda Sierras, MD

## 2018-10-27 NOTE — Telephone Encounter (Signed)
Medication Refill - Medication: levothyroxine (SYNTHROID, LEVOTHROID) 75 MCG tablet and estrogens-methylTEST (ESTRATEST) 1.25-2.5 MG tablet and triamterene-hydrochlorothiazide (MAXZIDE) 75-50 MG tablet   Preferred Pharmacy (with phone number or street name):  [facility] [id number], [place] [phone] (Phone) [phone] (Fax)

## 2018-10-28 ENCOUNTER — Other Ambulatory Visit: Payer: Self-pay | Admitting: Family Medicine

## 2018-10-28 MED ORDER — EST ESTROGENS-METHYLTEST 1.25-2.5 MG PO TABS: 1.0000 | ORAL_TABLET | Freq: Every day | ORAL | 1 refills | Status: DC

## 2018-10-28 MED ORDER — LEVOTHYROXINE SODIUM 75 MCG PO TABS: ORAL_TABLET | ORAL | 3 refills | Status: DC

## 2018-10-28 MED ORDER — TRIAMTERENE-HCTZ 75-50 MG PO TABS: 1.0000 | ORAL_TABLET | Freq: Every day | ORAL | 1 refills | Status: DC

## 2018-10-29 ENCOUNTER — Other Ambulatory Visit: Payer: Self-pay

## 2018-10-29 MED ORDER — EST ESTROGENS-METHYLTEST 1.25-2.5 MG PO TABS: 1.0000 | ORAL_TABLET | Freq: Every day | ORAL | 1 refills | Status: DC

## 2018-10-29 NOTE — Telephone Encounter (Signed)
Can you send this controlled med? Last OV 10/21/18  Thank you!

## 2018-11-05 ENCOUNTER — Other Ambulatory Visit: Payer: Self-pay | Admitting: Family Medicine

## 2018-11-05 ENCOUNTER — Other Ambulatory Visit: Payer: Self-pay

## 2018-11-05 DIAGNOSIS — Z780 Asymptomatic menopausal state: Secondary | ICD-10-CM

## 2018-11-05 MED ORDER — EST ESTROGENS-METHYLTEST 1.25-2.5 MG PO TABS: 1.0000 | ORAL_TABLET | Freq: Every day | ORAL | 1 refills | Status: DC

## 2018-11-10 DIAGNOSIS — Z1231 Encounter for screening mammogram for malignant neoplasm of breast: Secondary | ICD-10-CM | POA: Diagnosis not present

## 2018-11-10 LAB — HM MAMMOGRAPHY

## 2018-11-13 ENCOUNTER — Other Ambulatory Visit: Payer: Self-pay

## 2018-11-13 ENCOUNTER — Ambulatory Visit: Payer: Medicare Other | Admitting: *Deleted

## 2018-11-13 VITALS — Ht 68.0 in | Wt 150.0 lb

## 2018-11-13 DIAGNOSIS — Z86010 Personal history of colonic polyps: Secondary | ICD-10-CM

## 2018-11-13 MED ORDER — SUPREP BOWEL PREP KIT 17.5-3.13-1.6 GM/177ML PO SOLN: 1.0000 | Freq: Once | ORAL | 0 refills | Status: AC

## 2018-11-13 NOTE — Progress Notes (Signed)
No constipation issues per pt  No home oxygen used or hx of sleep apnea  She does have PONV  Registered in [place]  Pt's previsit is done over the phone and all paperwork (prep instructions, blank consent form to just read over, pre-procedure acknowledgement form and stamped envelope) sent to patient  Pt is aware that care partner will wait in the car during procedure; if they feel like they will be too hot to wait in the car; they may wait in the lobby.  We want them to wear a mask (we do not have any that we can provide them), practice social distancing, and we will check their temperatures when they get here.  I did remind patient that their care partner needs to stay in the parking lot the entire time. Pt will wear mask into building.

## 2018-11-21 ENCOUNTER — Encounter (HOSPITAL_COMMUNITY): Payer: Self-pay | Admitting: Emergency Medicine

## 2018-11-21 ENCOUNTER — Other Ambulatory Visit: Payer: Self-pay

## 2018-11-21 ENCOUNTER — Emergency Department (HOSPITAL_COMMUNITY)
Admission: EM | Admit: 2018-11-21 | Discharge: 2018-11-21 | Disposition: A | Discharge status: Home / Self Care | Payer: No Typology Code available for payment source | Attending: Emergency Medicine | Admitting: Emergency Medicine

## 2018-11-21 ENCOUNTER — Emergency Department (HOSPITAL_COMMUNITY): Payer: No Typology Code available for payment source

## 2018-11-21 DIAGNOSIS — Z23 Encounter for immunization: Secondary | ICD-10-CM | POA: Insufficient documentation

## 2018-11-21 DIAGNOSIS — Z85810 Personal history of malignant neoplasm of tongue: Secondary | ICD-10-CM | POA: Diagnosis not present

## 2018-11-21 DIAGNOSIS — S46912A Strain of unspecified muscle, fascia and tendon at shoulder and upper arm level, left arm, initial encounter: Secondary | ICD-10-CM | POA: Insufficient documentation

## 2018-11-21 DIAGNOSIS — Y92410 Unspecified street and highway as the place of occurrence of the external cause: Secondary | ICD-10-CM | POA: Diagnosis not present

## 2018-11-21 DIAGNOSIS — R0781 Pleurodynia: Secondary | ICD-10-CM | POA: Diagnosis not present

## 2018-11-21 DIAGNOSIS — Y999 Unspecified external cause status: Secondary | ICD-10-CM | POA: Diagnosis not present

## 2018-11-21 DIAGNOSIS — E039 Hypothyroidism, unspecified: Secondary | ICD-10-CM | POA: Insufficient documentation

## 2018-11-21 DIAGNOSIS — R52 Pain, unspecified: Secondary | ICD-10-CM | POA: Diagnosis not present

## 2018-11-21 DIAGNOSIS — Y939 Activity, unspecified: Secondary | ICD-10-CM | POA: Insufficient documentation

## 2018-11-21 DIAGNOSIS — Z79899 Other long term (current) drug therapy: Secondary | ICD-10-CM | POA: Insufficient documentation

## 2018-11-21 DIAGNOSIS — S299XXA Unspecified injury of thorax, initial encounter: Secondary | ICD-10-CM | POA: Diagnosis not present

## 2018-11-21 DIAGNOSIS — S4992XA Unspecified injury of left shoulder and upper arm, initial encounter: Secondary | ICD-10-CM | POA: Diagnosis not present

## 2018-11-21 DIAGNOSIS — I10 Essential (primary) hypertension: Secondary | ICD-10-CM | POA: Diagnosis not present

## 2018-11-21 DIAGNOSIS — S46812A Strain of other muscles, fascia and tendons at shoulder and upper arm level, left arm, initial encounter: Secondary | ICD-10-CM | POA: Diagnosis not present

## 2018-11-21 DIAGNOSIS — M25512 Pain in left shoulder: Secondary | ICD-10-CM | POA: Diagnosis not present

## 2018-11-21 MED ORDER — CYCLOBENZAPRINE HCL 10 MG PO TABS
5.0000 mg | ORAL_TABLET | Freq: Once | ORAL | Status: AC
  Administered 2018-11-21: 16:00:00 5 mg via ORAL
  Filled 2018-11-21 (×2): qty 1

## 2018-11-21 MED ORDER — IBUPROFEN 200 MG PO TABS
600.0000 mg | ORAL_TABLET | Freq: Once | ORAL | Status: AC
  Administered 2018-11-21: 600 mg via ORAL
  Filled 2018-11-21: qty 3

## 2018-11-21 MED ORDER — TETANUS-DIPHTH-ACELL PERTUSSIS 5-2.5-18.5 LF-MCG/0.5 IM SUSP
0.5000 mL | Freq: Once | INTRAMUSCULAR | Status: AC
  Administered 2018-11-21: 0.5 mL via INTRAMUSCULAR
  Filled 2018-11-21: qty 0.5

## 2018-11-21 MED ORDER — CYCLOBENZAPRINE HCL 5 MG PO TABS: 5.0000 mg | ORAL_TABLET | Freq: Three times a day (TID) | ORAL | 0 refills | Status: DC | PRN
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
## 2018-11-21 NOTE — Discharge Instructions (Signed)
Take motrin for pain   Take flexeril for muscle spasms   Use shoulder immobilizer for comfort   See your doctor  Return to ER if you have worse shoulder pain, headaches, vomiting, abdominal pain.
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
## 2018-11-25 DIAGNOSIS — L820 Inflamed seborrheic keratosis: Secondary | ICD-10-CM | POA: Diagnosis not present

## 2018-11-25 DIAGNOSIS — L578 Other skin changes due to chronic exposure to nonionizing radiation: Secondary | ICD-10-CM | POA: Diagnosis not present

## 2018-11-25 DIAGNOSIS — L821 Other seborrheic keratosis: Secondary | ICD-10-CM | POA: Diagnosis not present

## 2018-11-26 ENCOUNTER — Telehealth: Payer: Self-pay | Admitting: Internal Medicine

## 2018-11-26 NOTE — Telephone Encounter (Signed)

## 2018-11-27 ENCOUNTER — Encounter: Payer: Self-pay | Admitting: Internal Medicine

## 2018-11-27 ENCOUNTER — Other Ambulatory Visit: Payer: Self-pay

## 2018-11-27 ENCOUNTER — Ambulatory Visit (AMBULATORY_SURGERY_CENTER): Payer: Medicare Other | Admitting: Internal Medicine

## 2018-11-27 VITALS — BP 100/69 | HR 79 | Temp 98.0°F | Resp 22 | Ht 68.0 in | Wt 150.0 lb

## 2018-11-27 DIAGNOSIS — I10 Essential (primary) hypertension: Secondary | ICD-10-CM | POA: Diagnosis not present

## 2018-11-27 DIAGNOSIS — D120 Benign neoplasm of cecum: Secondary | ICD-10-CM | POA: Diagnosis not present

## 2018-11-27 DIAGNOSIS — K219 Gastro-esophageal reflux disease without esophagitis: Secondary | ICD-10-CM | POA: Diagnosis not present

## 2018-11-27 DIAGNOSIS — Z86010 Personal history of colonic polyps: Secondary | ICD-10-CM | POA: Diagnosis not present

## 2018-11-27 MED ORDER — SODIUM CHLORIDE 0.9 % IV SOLN: 500.0000 mL | Freq: Once | INTRAVENOUS | Status: DC

## 2018-11-27 NOTE — Op Note (Signed)
[name] Procedure Date: 11/27/2018 9:15 AM MRN: [id number] Endoscopist: Jerene Bears , MD Age: 78 Referring MD:  Date of Birth: 11-06-1940 Gender: Female Account #: [id number] Procedure:                Colonoscopy Indications:              High risk colon cancer surveillance: Personal                            history of non-advanced adenoma, Personal history                            of sessile serrated colon polyp (less than 10 mm in                            size) with no dysplasia, Last colonoscopy: 2013 Medicines:                Monitored Anesthesia Care Procedure:                Pre-Anesthesia Assessment:                           - Prior to the procedure, a History and Physical                            was performed, and patient medications and                            allergies were reviewed. The patient's tolerance of                            previous anesthesia was also reviewed. The risks                            and benefits of the procedure and the sedation                            options and risks were discussed with the patient.                            All questions were answered, and informed consent                            was obtained. Prior Anticoagulants: The patient has                            taken no previous anticoagulant or antiplatelet                            agents. ASA Grade Assessment: II - A patient with                            mild systemic disease. After reviewing the risks  and benefits, the patient was deemed in                            satisfactory condition to undergo the procedure.                           After obtaining informed consent, the colonoscope                            was passed under direct vision. Throughout the                            procedure, the patient's blood pressure, pulse, and                            oxygen  saturations were monitored continuously. The                            Colonoscope was introduced through the anus and                            advanced to the cecum, identified by appendiceal                            orifice and ileocecal valve. The colonoscopy was                            performed without difficulty. The patient tolerated                            the procedure well. The quality of the bowel                            preparation was good. The ileocecal valve,                            appendiceal orifice, and rectum were photographed. Scope In: 9:23:02 AM Scope Out: 9:45:29 AM Scope Withdrawal Time: 0 hours 17 minutes 56 seconds  Total Procedure Duration: 0 hours 22 minutes 27 seconds  Findings:                 The digital rectal exam was normal.                           A 4 mm polyp was found in the cecum. The polyp was                            sessile. The polyp was removed with a cold snare.                            Resection and retrieval were complete.                           The exam was otherwise without abnormality on  direct and retroflexion views. Complications:            No immediate complications. Estimated Blood Loss:     Estimated blood loss was minimal. Impression:               - One 4 mm polyp in the cecum, removed with a cold                            snare. Resected and retrieved.                           - The examination was otherwise normal on direct                            and retroflexion views. Recommendation:           - Patient has a contact number available for                            emergencies. The signs and symptoms of potential                            delayed complications were discussed with the                            patient. Return to normal activities tomorrow.                            Written discharge instructions were provided to the                             patient.                           - Resume previous diet.                           - Continue present medications.                           - Await pathology results.                           - No repeat colonoscopy due to age > 50 years at                            next surveillance interval. Jerene Bears, MD 11/27/2018 9:48:50 AM This report has been signed electronically.

## 2018-11-27 NOTE — Patient Instructions (Signed)
Discharge instructions given. Handout on polyps. Resume previous medications. YOU HAD AN ENDOSCOPIC PROCEDURE TODAY AT THE [facility]:   Refer to the procedure report that was given to you for any specific questions about what was found during the examination.  If the procedure report does not answer your questions, please call your gastroenterologist to clarify.  If you requested that your care partner not be given the details of your procedure findings, then the procedure report has been included in a sealed envelope for you to review at your convenience later.  YOU SHOULD EXPECT: Some feelings of bloating in the abdomen. Passage of more gas than usual.  Walking can help get rid of the air that was put into your GI tract during the procedure and reduce the bloating. If you had a lower endoscopy (such as a colonoscopy or flexible sigmoidoscopy) you may notice spotting of blood in your stool or on the toilet paper. If you underwent a bowel prep for your procedure, you may not have a normal bowel movement for a few days.  Please Note:  You might notice some irritation and congestion in your nose or some drainage.  This is from the oxygen used during your procedure.  There is no need for concern and it should clear up in a day or so.  SYMPTOMS TO REPORT IMMEDIATELY:   Following lower endoscopy (colonoscopy or flexible sigmoidoscopy):  Excessive amounts of blood in the stool  Significant tenderness or worsening of abdominal pains  Swelling of the abdomen that is new, acute  Fever of 100F or higher   For urgent or emergent issues, a gastroenterologist can be reached at any hour by calling [phone].   DIET:  We do recommend a small meal at first, but then you may proceed to your regular diet.  Drink plenty of fluids but you should avoid alcoholic beverages for 24 hours.  ACTIVITY:  You should plan to take it easy for the rest of today and you should NOT DRIVE or use heavy  machinery until tomorrow (because of the sedation medicines used during the test).    FOLLOW UP: Our staff will call the number listed on your records 48-72 hours following your procedure to check on you and address any questions or concerns that you may have regarding the information given to you following your procedure. If we do not reach you, we will leave a message.  We will attempt to reach you two times.  During this call, we will ask if you have developed any symptoms of COVID 19. If you develop any symptoms (ie: fever, flu-like symptoms, shortness of breath, cough etc.) before then, please call [phone].  If you test positive for Covid 19 in the 2 weeks post procedure, please call and report this information to us.    If any biopsies were taken you will be contacted by phone or by letter within the next 1-3 weeks.  Please call us at [phone] if you have not heard about the biopsies in 3 weeks.    SIGNATURES/CONFIDENTIALITY: You and/or your care partner have signed paperwork which will be entered into your electronic medical record.  These signatures attest to the fact that that the information above on your After Visit Summary has been reviewed and is understood.  Full responsibility of the confidentiality of this discharge information lies with you and/or your care-partner. 

## 2018-11-27 NOTE — Progress Notes (Signed)
Called to room to assist during endoscopic procedure.  Patient ID and intended procedure confirmed with present staff. Received instructions for my participation in the procedure from the performing physician.  

## 2018-11-27 NOTE — Progress Notes (Signed)
Report to PACU, RN, vss, BBS= Clear.  

## 2018-12-01 ENCOUNTER — Telehealth: Payer: Self-pay

## 2018-12-01 ENCOUNTER — Encounter: Payer: Self-pay | Admitting: Internal Medicine

## 2018-12-01 NOTE — Telephone Encounter (Signed)
NO ANSWER, MESSAGE LEFT FOR PATIENT. 

## 2018-12-01 NOTE — Telephone Encounter (Signed)
  Follow up Call-  Call back number 11/27/2018  Post procedure Call Back phone  # [phone]  Permission to leave phone message Yes  Some recent data might be hidden     Patient questions:  Do you have a fever, pain , or abdominal swelling? No. Pain Score  0 *  Have you tolerated food without any problems? Yes.    Have you been able to return to your normal activities? Yes.    Do you have any questions about your discharge instructions: Diet   No. Medications  No. Follow up visit  No.  Do you have questions or concerns about your Care? No.  Actions: * If pain score is 4 or above: No action needed, pain <4. 1. Have you developed a fever since your procedure? no  2.   Have you had an respiratory symptoms (SOB or cough) since your procedure? no  3.   Have you tested positive for COVID 19 since your procedure no  4.   Have you had any family members/close contacts diagnosed with the COVID 19 since your procedure?  no   If yes to any of these questions please route to Joylene John, RN and Alphonsa Gin, Therapist, sports.

## 2018-12-18 DIAGNOSIS — I10 Essential (primary) hypertension: Secondary | ICD-10-CM | POA: Diagnosis not present

## 2018-12-18 DIAGNOSIS — M542 Cervicalgia: Secondary | ICD-10-CM | POA: Diagnosis not present

## 2018-12-23 ENCOUNTER — Other Ambulatory Visit: Payer: Self-pay | Admitting: Neurological Surgery

## 2018-12-23 DIAGNOSIS — M542 Cervicalgia: Secondary | ICD-10-CM

## 2018-12-27 ENCOUNTER — Encounter (INDEPENDENT_AMBULATORY_CARE_PROVIDER_SITE_OTHER): Payer: Self-pay

## 2018-12-29 ENCOUNTER — Ambulatory Visit
Admission: RE | Admit: 2018-12-29 | Discharge: 2018-12-29 | Disposition: A | Discharge status: Home / Self Care | Payer: Medicare Other | Source: Ambulatory Visit | Attending: Neurological Surgery | Admitting: Neurological Surgery

## 2018-12-29 ENCOUNTER — Other Ambulatory Visit: Payer: Self-pay

## 2018-12-29 DIAGNOSIS — M542 Cervicalgia: Secondary | ICD-10-CM | POA: Diagnosis not present

## 2018-12-29 DIAGNOSIS — S199XXA Unspecified injury of neck, initial encounter: Secondary | ICD-10-CM | POA: Diagnosis not present

## 2019-01-01 ENCOUNTER — Ambulatory Visit (INDEPENDENT_AMBULATORY_CARE_PROVIDER_SITE_OTHER): Payer: Medicare Other | Admitting: Otolaryngology

## 2019-01-01 ENCOUNTER — Other Ambulatory Visit: Payer: Self-pay

## 2019-01-01 DIAGNOSIS — Z85810 Personal history of malignant neoplasm of tongue: Secondary | ICD-10-CM

## 2019-01-01 NOTE — Progress Notes (Signed)
HPI: Stacy Wagner is a 78 y.o. female who returns today for evaluation of follow-up for excision of a T1N0 right lateral tongue squamous cell carcinoma.  Pathology report revealed a 1.8 cm moderately differentiated squamous cell carcinoma with depth of invasion 4 mm.  Resection margins were not involved.  This was performed in November 2019.  She has been doing well with no specific complaints..  Past Medical History:  Diagnosis Date  . AC (acromioclavicular) joint bone spurs   . Arthritis    "lower back" (01/15/2018)  . Bursitis disorder    bil shoulders  . Chronic lower back pain   . Complication of anesthesia    some nausea in past with anesthesia, pt. also recalls having the "tube removed too early & her her throat was full of mucous, states that she could move any part of her body)  . Family history of adverse reaction to anesthesia    "daughter gets PONV" (01/15/2018)  . GERD (gastroesophageal reflux disease)   . Hearing loss    bil / no hearing aids  . Hypertension   . Hypothyroidism   . Migraine    "used to have them all the time; none in 10 yrs" (01/15/2018)  . OA (osteoarthritis)    Multiple injections in lumbar region   . PONV (postoperative nausea and vomiting)   . Prolapse of female bladder, acquired   . Skin cancer 11/2017   "left thigh"  . Thyroid disease   . Tongue cancer ([place])    12-2017   Past Surgical History:  Procedure Laterality Date  . ABDOMINAL HYSTERECTOMY     uterine prolapse  . ABDOMINOPLASTY     tummy tuck Emilio Aspen excess skin arms and legs  . APPENDECTOMY    . BACK SURGERY    . BREAST SURGERY Right 1980   removal of a nodule- post trauma, benign pathology  . CARPAL TUNNEL RELEASE Right   . CATARACT EXTRACTION, BILATERAL    . COLONOSCOPY    . GLOSSECTOMY N/A 01/15/2018   Procedure: PARTIAL GLOSSECTOMY;  Surgeon: Rozetta Nunnery, MD;  Location: [facility] OR;  Service: ENT;  Laterality: N/A;  . LUMBAR SPINE SURGERY  1988   /w Dr. Joya Salm-  reports that it was in the lumbar region, unsure of date.  Marland Kitchen NASAL POLYP EXCISION    . PARTIAL GLOSSECTOMY  01/15/2018  . SKIN CANCER EXCISION Left 11/2017   "thigh"  . TONSILLECTOMY     Social History   Socioeconomic History  . Marital status: Married    Spouse name: Not on file  . Number of children: Not on file  . Years of education: Not on file  . Highest education level: Not on file  Occupational History  . Not on file  Social Needs  . Financial resource strain: Not on file  . Food insecurity    Worry: Not on file    Inability: Not on file  . Transportation needs    Medical: Not on file    Non-medical: Not on file  Tobacco Use  . Smoking status: Never Smoker  . Smokeless tobacco: Never Used  Substance and Sexual Activity  . Alcohol use: Never    Alcohol/week: 0.0 standard drinks    Frequency: Never  . Drug use: Never  . Sexual activity: Not Currently    Partners: Male  Lifestyle  . Physical activity    Days per week: Not on file    Minutes per session: Not on file  . Stress:  Not on file  Relationships  . Social Herbalist on phone: Not on file    Gets together: Not on file    Attends religious service: Not on file    Active member of club or organization: Not on file    Attends meetings of clubs or organizations: Not on file    Relationship status: Not on file  Other Topics Concern  . Not on file  Social History Narrative  . Not on file   Family History  Problem Relation Age of Onset  . Cancer Mother        ?lung cancer  . Hypertension Sister   . Diabetes Father   . Stomach cancer Cousin   . Rectal cancer Neg Hx   . Esophageal cancer Neg Hx   . Colon cancer Neg Hx    Allergies  Allergen Reactions  . Codeine Nausea Only   Prior to Admission medications   Medication Sig Start Date End Date Taking? Authorizing Provider  acetaminophen (TYLENOL) 500 MG tablet Take 1,000 mg by mouth every 6 (six) hours as needed for moderate pain or  headache.    [provider]  Ascorbic Acid (VITAMIN C PO) Take by mouth daily.    [provider]  Calcium Carbonate-Vit D-Min (CALTRATE 600+D PLUS MINERALS PO) Take by mouth daily.    [provider]  cyclobenzaprine (FLEXERIL) 5 MG tablet Take 1 tablet (5 mg total) by mouth 3 (three) times daily as needed for muscle spasms. 11/21/18   Drenda Freeze, MD  esomeprazole (NEXIUM) 20 MG capsule Take 20 mg by mouth at bedtime.    [provider]  estrogens-methylTEST (ESTRATEST) 1.25-2.5 MG tablet Take 1 tablet by mouth daily. 11/05/18   Burchette, Alinda Sierras, MD  ibuprofen (ADVIL,MOTRIN) 200 MG tablet Take 800 mg by mouth at bedtime as needed for headache or moderate pain.    [provider]  levothyroxine (SYNTHROID) 75 MCG tablet TAKE 1 TABLET BY MOUTH EVERY DAY ON AN EMPTY STOMACH 10/28/18   Burchette, Alinda Sierras, MD  LOTEMAX SM 0.38 % GEL as needed.  10/08/18   [provider]  meclizine (ANTIVERT) 25 MG tablet Take 1 tablet (25 mg total) by mouth 3 (three) times daily as needed for dizziness. 01/16/18   Rozetta Nunnery, MD  Multiple Vitamins-Minerals (OSTEO COMPLEX PO) Take by mouth daily.    [provider]  OVER THE COUNTER MEDICATION Take 4 tablets by mouth daily. Serovital Supplement    [provider]  OVER THE COUNTER MEDICATION Serovital daily for sleep    [provider]  Thiamine HCl (VITAMIN B-1 PO) Take by mouth daily.    [provider]  triamterene-hydrochlorothiazide (MAXZIDE) 75-50 MG tablet Take 1 tablet by mouth daily with breakfast. 10/28/18 11/27/19  Burchette, Alinda Sierras, MD  VITAMIN D PO Take by mouth daily.    [provider]     Positive ROS: Are otherwise negative  All other systems have been reviewed and were otherwise negative with the exception of those mentioned in the HPI and as above.  Physical Exam: General: Alert, no acute distress Ears: Ear canals are clear  bilaterally with intact, clear TMs Nasal: Clear nasal passages Oral: Clear oropharynx.  Right lateral tongue is soft to palpation with no abnormal mucosal lesions noted.  She has minimal deviation of the tongue to the right with extension.  Palpation of the tongue was soft with no palpable nodules or lesions.  Remaining  oral mucosa is clear.  Indirect laryngoscopy revealed a clear base of tongue, vallecula and epiglottis. Neck: No palpable adenopathy or masses.  Specificly no palpable right submandibular or right jugular nodes palpable.  Procedures  Assessment: History of T1N0 squamous cell carcinoma of the right lateral tongue status post partial glossectomy in November 2019 No evidence of recurrent disease on clinical exam in the office today  Plan: She will follow-up in 6 months for recheck.  Earlier if she notices any problems.   Radene Journey, MD

## 2019-01-08 DIAGNOSIS — I10 Essential (primary) hypertension: Secondary | ICD-10-CM | POA: Diagnosis not present

## 2019-01-08 DIAGNOSIS — M4120 Other idiopathic scoliosis, site unspecified: Secondary | ICD-10-CM | POA: Diagnosis not present

## 2019-01-21 DIAGNOSIS — M545 Low back pain: Secondary | ICD-10-CM | POA: Diagnosis not present

## 2019-01-21 DIAGNOSIS — M48062 Spinal stenosis, lumbar region with neurogenic claudication: Secondary | ICD-10-CM | POA: Diagnosis not present

## 2019-01-21 DIAGNOSIS — M4120 Other idiopathic scoliosis, site unspecified: Secondary | ICD-10-CM | POA: Diagnosis not present

## 2019-01-21 DIAGNOSIS — M47816 Spondylosis without myelopathy or radiculopathy, lumbar region: Secondary | ICD-10-CM | POA: Diagnosis not present

## 2019-01-21 DIAGNOSIS — M5416 Radiculopathy, lumbar region: Secondary | ICD-10-CM | POA: Diagnosis not present

## 2019-01-21 DIAGNOSIS — I10 Essential (primary) hypertension: Secondary | ICD-10-CM | POA: Diagnosis not present

## 2019-02-02 ENCOUNTER — Other Ambulatory Visit: Payer: Self-pay | Admitting: Neurosurgery

## 2019-02-02 DIAGNOSIS — Z780 Asymptomatic menopausal state: Secondary | ICD-10-CM | POA: Diagnosis not present

## 2019-02-02 DIAGNOSIS — E2839 Other primary ovarian failure: Secondary | ICD-10-CM | POA: Diagnosis not present

## 2019-02-02 DIAGNOSIS — R634 Abnormal weight loss: Secondary | ICD-10-CM | POA: Diagnosis not present

## 2019-02-02 DIAGNOSIS — Z90710 Acquired absence of both cervix and uterus: Secondary | ICD-10-CM | POA: Diagnosis not present

## 2019-02-02 DIAGNOSIS — M5416 Radiculopathy, lumbar region: Secondary | ICD-10-CM

## 2019-02-02 DIAGNOSIS — R29890 Loss of height: Secondary | ICD-10-CM | POA: Diagnosis not present

## 2019-02-02 DIAGNOSIS — Z85038 Personal history of other malignant neoplasm of large intestine: Secondary | ICD-10-CM | POA: Diagnosis not present

## 2019-02-02 LAB — HM DEXA SCAN: HM Dexa Scan: NORMAL

## 2019-02-10 ENCOUNTER — Encounter: Payer: Self-pay | Admitting: *Deleted

## 2019-02-25 ENCOUNTER — Other Ambulatory Visit: Payer: Self-pay

## 2019-02-25 ENCOUNTER — Ambulatory Visit
Admission: RE | Admit: 2019-02-25 | Discharge: 2019-02-25 | Disposition: A | Discharge status: Home / Self Care | Payer: Medicare Other | Source: Ambulatory Visit | Attending: Neurosurgery | Admitting: Neurosurgery

## 2019-02-25 DIAGNOSIS — M5416 Radiculopathy, lumbar region: Secondary | ICD-10-CM

## 2019-03-08 ENCOUNTER — Ambulatory Visit: Payer: Medicare Other | Attending: Internal Medicine

## 2019-03-08 DIAGNOSIS — Z23 Encounter for immunization: Secondary | ICD-10-CM | POA: Insufficient documentation

## 2019-03-08 NOTE — Progress Notes (Signed)
   Covid-19 Vaccination Clinic  Name:  Stacy Wagner    MRN: [id number] DOB: 08-26-1940  03/08/2019  [name] was observed post Covid-19 immunization for 15 minutes without incidence. She was provided with Vaccine Information Sheet and instruction to access the V-Safe system.   [name] was instructed to call 911 with any severe reactions post vaccine: Marland Kitchen Difficulty breathing  . Swelling of your face and throat  . A fast heartbeat  . A bad rash all over your body  . Dizziness and weakness    Immunizations Administered    Name Date Dose VIS Date Route   Pfizer COVID-19 Vaccine 03/08/2019  1:41 PM 0.3 mL 02/06/2019 Intramuscular   Manufacturer: [employer]   Lot: [id number]   [place]: [id number]

## 2019-03-10 ENCOUNTER — Ambulatory Visit (INDEPENDENT_AMBULATORY_CARE_PROVIDER_SITE_OTHER): Payer: Medicare Other | Admitting: Otolaryngology

## 2019-03-19 ENCOUNTER — Ambulatory Visit (INDEPENDENT_AMBULATORY_CARE_PROVIDER_SITE_OTHER): Payer: Medicare Other | Admitting: Otolaryngology

## 2019-03-19 ENCOUNTER — Other Ambulatory Visit: Payer: Self-pay

## 2019-03-19 VITALS — Temp 97.3°F

## 2019-03-19 DIAGNOSIS — K140 Glossitis: Secondary | ICD-10-CM | POA: Diagnosis not present

## 2019-03-19 NOTE — Progress Notes (Signed)
HPI: Stacy Wagner is a 79 y.o. female who returns today for evaluation of soreness on the right side of her tongue.  It was worse 3 to 4 weeks ago but is doing better now.  She is status post right hemiglossectomy for squamous cell carcinoma performed in November 2019.  Tumor depth of invasion at that time was 4 mm.  She had negative margins.  She has had aphthous ulcers in the past.  And the soreness on the right lateral anterior tongue is doing better today..  Past Medical History:  Diagnosis Date  . AC (acromioclavicular) joint bone spurs   . Arthritis    "lower back" (01/15/2018)  . Bursitis disorder    bil shoulders  . Chronic lower back pain   . Complication of anesthesia    some nausea in past with anesthesia, pt. also recalls having the "tube removed too early & her her throat was full of mucous, states that she could move any part of her body)  . Family history of adverse reaction to anesthesia    "daughter gets PONV" (01/15/2018)  . GERD (gastroesophageal reflux disease)   . Hearing loss    bil / no hearing aids  . Hypertension   . Hypothyroidism   . Migraine    "used to have them all the time; none in 10 yrs" (01/15/2018)  . OA (osteoarthritis)    Multiple injections in lumbar region   . PONV (postoperative nausea and vomiting)   . Prolapse of female bladder, acquired   . Skin cancer 11/2017   "left thigh"  . Thyroid disease   . Tongue cancer ([place])    12-2017   Past Surgical History:  Procedure Laterality Date  . ABDOMINAL HYSTERECTOMY     uterine prolapse  . ABDOMINOPLASTY     tummy tuck Emilio Aspen excess skin arms and legs  . APPENDECTOMY    . BACK SURGERY    . BREAST SURGERY Right 1980   removal of a nodule- post trauma, benign pathology  . CARPAL TUNNEL RELEASE Right   . CATARACT EXTRACTION, BILATERAL    . COLONOSCOPY    . GLOSSECTOMY N/A 01/15/2018   Procedure: PARTIAL GLOSSECTOMY;  Surgeon: Rozetta Nunnery, MD;  Location: [facility] OR;  Service: ENT;   Laterality: N/A;  . LUMBAR SPINE SURGERY  1988   /w Dr. Joya Salm- reports that it was in the lumbar region, unsure of date.  Marland Kitchen NASAL POLYP EXCISION    . PARTIAL GLOSSECTOMY  01/15/2018  . SKIN CANCER EXCISION Left 11/2017   "thigh"  . TONSILLECTOMY     Social History   Socioeconomic History  . Marital status: Married    Spouse name: Not on file  . Number of children: Not on file  . Years of education: Not on file  . Highest education level: Not on file  Occupational History  . Not on file  Tobacco Use  . Smoking status: Never Smoker  . Smokeless tobacco: Never Used  Substance and Sexual Activity  . Alcohol use: Never    Alcohol/week: 0.0 standard drinks  . Drug use: Never  . Sexual activity: Not Currently    Partners: Male  Other Topics Concern  . Not on file  Social History Narrative  . Not on file   Social Determinants of Health   Financial Resource Strain:   . Difficulty of Paying Living Expenses: Not on file  Food Insecurity:   . Worried About Charity fundraiser in the Last Year:  Not on file  . Ran Out of Food in the Last Year: Not on file  Transportation Needs:   . Lack of Transportation (Medical): Not on file  . Lack of Transportation (Non-Medical): Not on file  Physical Activity:   . Days of Exercise per Week: Not on file  . Minutes of Exercise per Session: Not on file  Stress:   . Feeling of Stress : Not on file  Social Connections:   . Frequency of Communication with Friends and Family: Not on file  . Frequency of Social Gatherings with Friends and Family: Not on file  . Attends Religious Services: Not on file  . Active Member of Clubs or Organizations: Not on file  . Attends Archivist Meetings: Not on file  . Marital Status: Not on file   Family History  Problem Relation Age of Onset  . Cancer Mother        ?lung cancer  . Hypertension Sister   . Diabetes Father   . Stomach cancer Cousin   . Rectal cancer Neg Hx   . Esophageal cancer  Neg Hx   . Colon cancer Neg Hx    Allergies  Allergen Reactions  . Codeine Nausea Only   Prior to Admission medications   Medication Sig Start Date End Date Taking? Authorizing Provider  acetaminophen (TYLENOL) 500 MG tablet Take 1,000 mg by mouth every 6 (six) hours as needed for moderate pain or headache.   Yes [provider]  Ascorbic Acid (VITAMIN C PO) Take by mouth daily.   Yes [provider]  Calcium Carbonate-Vit D-Min (CALTRATE 600+D PLUS MINERALS PO) Take by mouth daily.   Yes [provider]  cyclobenzaprine (FLEXERIL) 5 MG tablet Take 1 tablet (5 mg total) by mouth 3 (three) times daily as needed for muscle spasms. 11/21/18  Yes Drenda Freeze, MD  esomeprazole (NEXIUM) 20 MG capsule Take 20 mg by mouth at bedtime.   Yes [provider]  estrogens-methylTEST (ESTRATEST) 1.25-2.5 MG tablet Take 1 tablet by mouth daily. 11/05/18  Yes Burchette, Alinda Sierras, MD  ibuprofen (ADVIL,MOTRIN) 200 MG tablet Take 800 mg by mouth at bedtime as needed for headache or moderate pain.   Yes [provider]  levothyroxine (SYNTHROID) 75 MCG tablet TAKE 1 TABLET BY MOUTH EVERY DAY ON AN EMPTY STOMACH 10/28/18  Yes Burchette, Alinda Sierras, MD  LOTEMAX SM 0.38 % GEL as needed.  10/08/18  Yes [provider]  meclizine (ANTIVERT) 25 MG tablet Take 1 tablet (25 mg total) by mouth 3 (three) times daily as needed for dizziness. 01/16/18  Yes Rozetta Nunnery, MD  Multiple Vitamins-Minerals (OSTEO COMPLEX PO) Take by mouth daily.   Yes [provider]  OVER THE COUNTER MEDICATION Take 4 tablets by mouth daily. Serovital Supplement   Yes [provider]  OVER THE COUNTER MEDICATION Serovital daily for sleep   Yes [provider]  Thiamine HCl (VITAMIN B-1 PO) Take by mouth daily.   Yes [provider]  triamterene-hydrochlorothiazide (MAXZIDE) 75-50 MG tablet Take 1 tablet by mouth daily with breakfast. 10/28/18 11/27/19 Yes  Burchette, Alinda Sierras, MD  VITAMIN D PO Take by mouth daily.   Yes [provider]     Positive ROS: Otherwise negative  All other systems have been reviewed and were otherwise negative with the exception of those mentioned in the HPI and as above.  Physical Exam: Constitutional: Alert, well-appearing, no acute distress Ears: External ears without lesions  or tenderness. Ear canals are clear bilaterally with intact, clear TMs.  Nasal: External nose without lesions. Septum midline. Clear nasal passages Oral: Lips and gums without lesions. Tongue status post right partial glossectomy.  The area of soreness she describes is on the anterior portion of this and more toward the tip of the tongue.  There is no ulceration noted.  Mucosa appears intact.  This area soft to palpation with no induration and is not really tender today on exam.  Remaining floor mouth is clear.  It appears that her tongue is irritated somewhat by her teeth. Neck: No palpable adenopathy or masses.  No palpable nodules within the submandibular or submental region on close examination Respiratory: Breathing comfortably  Skin: No facial/neck lesions or rash noted.  Procedures  Assessment: Status post partial glossectomy for T1N0 squamous cell carcinoma of the right lateral tongue 01/15/2018. Mild glossitis.  On clinical exam no evidence of recurrent disease  Plan: She will follow-up in 4 months for recheck. She will return earlier if she notices any worsening of the soreness of the tongue Also discussed with her concerning using a mouthguard her teeth guard on the lower teeth.    Radene Journey, MD

## 2019-03-25 DIAGNOSIS — M6281 Muscle weakness (generalized): Secondary | ICD-10-CM | POA: Diagnosis not present

## 2019-03-25 DIAGNOSIS — R293 Abnormal posture: Secondary | ICD-10-CM | POA: Diagnosis not present

## 2019-03-25 DIAGNOSIS — M546 Pain in thoracic spine: Secondary | ICD-10-CM | POA: Diagnosis not present

## 2019-03-25 DIAGNOSIS — M5136 Other intervertebral disc degeneration, lumbar region: Secondary | ICD-10-CM | POA: Diagnosis not present

## 2019-03-26 ENCOUNTER — Ambulatory Visit: Payer: Medicare Other

## 2019-03-27 DIAGNOSIS — M5136 Other intervertebral disc degeneration, lumbar region: Secondary | ICD-10-CM | POA: Diagnosis not present

## 2019-03-27 DIAGNOSIS — M546 Pain in thoracic spine: Secondary | ICD-10-CM | POA: Diagnosis not present

## 2019-03-27 DIAGNOSIS — M6281 Muscle weakness (generalized): Secondary | ICD-10-CM | POA: Diagnosis not present

## 2019-03-27 DIAGNOSIS — R293 Abnormal posture: Secondary | ICD-10-CM | POA: Diagnosis not present

## 2019-03-28 ENCOUNTER — Ambulatory Visit: Payer: Medicare Other | Attending: Internal Medicine

## 2019-03-28 DIAGNOSIS — Z23 Encounter for immunization: Secondary | ICD-10-CM | POA: Insufficient documentation

## 2019-03-28 NOTE — Progress Notes (Signed)
   Covid-19 Vaccination Clinic  Name:  TRISHA LILLARD    MRN: [id number] DOB: [birth year]  03/28/2019  Ms. Hackworth was observed post Covid-19 immunization for 15 minutes without incidence. She was provided with Vaccine Information Sheet and instruction to access the V-Safe system.   Ms. Wiman was instructed to call 911 with any severe reactions post vaccine: Marland Kitchen Difficulty breathing  . Swelling of your face and throat  . A fast heartbeat  . A bad rash all over your body  . Dizziness and weakness    Immunizations Administered    Name Date Dose VIS Date Route   Pfizer COVID-19 Vaccine 03/28/2019  8:56 AM 0.3 mL 02/06/2019 Intramuscular   Manufacturer: [employer]   Lot: [id number]   [place]: [id number]

## 2019-03-30 DIAGNOSIS — M6281 Muscle weakness (generalized): Secondary | ICD-10-CM | POA: Diagnosis not present

## 2019-03-30 DIAGNOSIS — M546 Pain in thoracic spine: Secondary | ICD-10-CM | POA: Diagnosis not present

## 2019-03-30 DIAGNOSIS — M5136 Other intervertebral disc degeneration, lumbar region: Secondary | ICD-10-CM | POA: Diagnosis not present

## 2019-03-30 DIAGNOSIS — R293 Abnormal posture: Secondary | ICD-10-CM | POA: Diagnosis not present

## 2019-04-02 DIAGNOSIS — M546 Pain in thoracic spine: Secondary | ICD-10-CM | POA: Diagnosis not present

## 2019-04-02 DIAGNOSIS — M6281 Muscle weakness (generalized): Secondary | ICD-10-CM | POA: Diagnosis not present

## 2019-04-02 DIAGNOSIS — R293 Abnormal posture: Secondary | ICD-10-CM | POA: Diagnosis not present

## 2019-04-02 DIAGNOSIS — M5136 Other intervertebral disc degeneration, lumbar region: Secondary | ICD-10-CM | POA: Diagnosis not present

## 2019-04-07 DIAGNOSIS — M546 Pain in thoracic spine: Secondary | ICD-10-CM | POA: Diagnosis not present

## 2019-04-07 DIAGNOSIS — M5136 Other intervertebral disc degeneration, lumbar region: Secondary | ICD-10-CM | POA: Diagnosis not present

## 2019-04-07 DIAGNOSIS — M6281 Muscle weakness (generalized): Secondary | ICD-10-CM | POA: Diagnosis not present

## 2019-04-07 DIAGNOSIS — R293 Abnormal posture: Secondary | ICD-10-CM | POA: Diagnosis not present

## 2019-04-09 DIAGNOSIS — M5136 Other intervertebral disc degeneration, lumbar region: Secondary | ICD-10-CM | POA: Diagnosis not present

## 2019-04-09 DIAGNOSIS — R293 Abnormal posture: Secondary | ICD-10-CM | POA: Diagnosis not present

## 2019-04-09 DIAGNOSIS — M6281 Muscle weakness (generalized): Secondary | ICD-10-CM | POA: Diagnosis not present

## 2019-04-09 DIAGNOSIS — M546 Pain in thoracic spine: Secondary | ICD-10-CM | POA: Diagnosis not present

## 2019-04-14 DIAGNOSIS — M5136 Other intervertebral disc degeneration, lumbar region: Secondary | ICD-10-CM | POA: Diagnosis not present

## 2019-04-14 DIAGNOSIS — R293 Abnormal posture: Secondary | ICD-10-CM | POA: Diagnosis not present

## 2019-04-14 DIAGNOSIS — M546 Pain in thoracic spine: Secondary | ICD-10-CM | POA: Diagnosis not present

## 2019-04-14 DIAGNOSIS — M6281 Muscle weakness (generalized): Secondary | ICD-10-CM | POA: Diagnosis not present

## 2019-04-16 DIAGNOSIS — M5136 Other intervertebral disc degeneration, lumbar region: Secondary | ICD-10-CM | POA: Diagnosis not present

## 2019-04-16 DIAGNOSIS — M6281 Muscle weakness (generalized): Secondary | ICD-10-CM | POA: Diagnosis not present

## 2019-04-16 DIAGNOSIS — R293 Abnormal posture: Secondary | ICD-10-CM | POA: Diagnosis not present

## 2019-04-16 DIAGNOSIS — M546 Pain in thoracic spine: Secondary | ICD-10-CM | POA: Diagnosis not present

## 2019-04-20 DIAGNOSIS — M546 Pain in thoracic spine: Secondary | ICD-10-CM | POA: Diagnosis not present

## 2019-04-20 DIAGNOSIS — M5136 Other intervertebral disc degeneration, lumbar region: Secondary | ICD-10-CM | POA: Diagnosis not present

## 2019-04-20 DIAGNOSIS — R293 Abnormal posture: Secondary | ICD-10-CM | POA: Diagnosis not present

## 2019-04-20 DIAGNOSIS — M6281 Muscle weakness (generalized): Secondary | ICD-10-CM | POA: Diagnosis not present

## 2019-04-23 DIAGNOSIS — M5136 Other intervertebral disc degeneration, lumbar region: Secondary | ICD-10-CM | POA: Diagnosis not present

## 2019-04-23 DIAGNOSIS — M546 Pain in thoracic spine: Secondary | ICD-10-CM | POA: Diagnosis not present

## 2019-04-23 DIAGNOSIS — R293 Abnormal posture: Secondary | ICD-10-CM | POA: Diagnosis not present

## 2019-04-23 DIAGNOSIS — M6281 Muscle weakness (generalized): Secondary | ICD-10-CM | POA: Diagnosis not present

## 2019-04-29 DIAGNOSIS — M5416 Radiculopathy, lumbar region: Secondary | ICD-10-CM | POA: Diagnosis not present

## 2019-04-29 DIAGNOSIS — M48062 Spinal stenosis, lumbar region with neurogenic claudication: Secondary | ICD-10-CM | POA: Diagnosis not present

## 2019-04-29 DIAGNOSIS — M4120 Other idiopathic scoliosis, site unspecified: Secondary | ICD-10-CM | POA: Diagnosis not present

## 2019-04-29 DIAGNOSIS — M545 Low back pain: Secondary | ICD-10-CM | POA: Diagnosis not present

## 2019-04-29 DIAGNOSIS — I10 Essential (primary) hypertension: Secondary | ICD-10-CM | POA: Diagnosis not present

## 2019-04-29 DIAGNOSIS — M438X9 Other specified deforming dorsopathies, site unspecified: Secondary | ICD-10-CM | POA: Diagnosis not present

## 2019-05-11 ENCOUNTER — Other Ambulatory Visit: Payer: Self-pay | Admitting: Family Medicine

## 2019-05-20 DIAGNOSIS — M5416 Radiculopathy, lumbar region: Secondary | ICD-10-CM | POA: Diagnosis not present

## 2019-05-20 DIAGNOSIS — M438X9 Other specified deforming dorsopathies, site unspecified: Secondary | ICD-10-CM | POA: Diagnosis not present

## 2019-05-20 DIAGNOSIS — M4120 Other idiopathic scoliosis, site unspecified: Secondary | ICD-10-CM | POA: Diagnosis not present

## 2019-05-20 DIAGNOSIS — M545 Low back pain: Secondary | ICD-10-CM | POA: Diagnosis not present

## 2019-05-20 DIAGNOSIS — M48062 Spinal stenosis, lumbar region with neurogenic claudication: Secondary | ICD-10-CM | POA: Diagnosis not present

## 2019-05-20 DIAGNOSIS — I10 Essential (primary) hypertension: Secondary | ICD-10-CM | POA: Diagnosis not present

## 2019-05-21 ENCOUNTER — Other Ambulatory Visit: Payer: Self-pay | Admitting: Family Medicine

## 2019-05-21 ENCOUNTER — Other Ambulatory Visit: Payer: Self-pay | Admitting: Neurosurgery

## 2019-05-21 DIAGNOSIS — M48062 Spinal stenosis, lumbar region with neurogenic claudication: Secondary | ICD-10-CM

## 2019-05-21 DIAGNOSIS — Z780 Asymptomatic menopausal state: Secondary | ICD-10-CM

## 2019-06-07 ENCOUNTER — Other Ambulatory Visit: Payer: Self-pay

## 2019-06-07 ENCOUNTER — Ambulatory Visit
Admission: RE | Admit: 2019-06-07 | Discharge: 2019-06-07 | Disposition: A | Discharge status: Home / Self Care | Payer: Medicare Other | Source: Ambulatory Visit | Attending: Neurosurgery | Admitting: Neurosurgery

## 2019-06-07 DIAGNOSIS — M48061 Spinal stenosis, lumbar region without neurogenic claudication: Secondary | ICD-10-CM | POA: Diagnosis not present

## 2019-06-07 DIAGNOSIS — M48062 Spinal stenosis, lumbar region with neurogenic claudication: Secondary | ICD-10-CM

## 2019-06-08 DIAGNOSIS — I10 Essential (primary) hypertension: Secondary | ICD-10-CM | POA: Diagnosis not present

## 2019-06-08 DIAGNOSIS — M5416 Radiculopathy, lumbar region: Secondary | ICD-10-CM | POA: Diagnosis not present

## 2019-06-08 DIAGNOSIS — M4120 Other idiopathic scoliosis, site unspecified: Secondary | ICD-10-CM | POA: Diagnosis not present

## 2019-06-08 DIAGNOSIS — M48062 Spinal stenosis, lumbar region with neurogenic claudication: Secondary | ICD-10-CM | POA: Diagnosis not present

## 2019-06-08 DIAGNOSIS — M545 Low back pain: Secondary | ICD-10-CM | POA: Diagnosis not present

## 2019-06-08 DIAGNOSIS — M438X9 Other specified deforming dorsopathies, site unspecified: Secondary | ICD-10-CM | POA: Diagnosis not present

## 2019-06-15 ENCOUNTER — Other Ambulatory Visit: Payer: Self-pay

## 2019-06-15 ENCOUNTER — Other Ambulatory Visit: Payer: Self-pay | Admitting: Neurosurgery

## 2019-06-16 ENCOUNTER — Other Ambulatory Visit: Payer: Self-pay

## 2019-07-02 ENCOUNTER — Other Ambulatory Visit: Payer: Self-pay

## 2019-07-02 ENCOUNTER — Ambulatory Visit (INDEPENDENT_AMBULATORY_CARE_PROVIDER_SITE_OTHER): Payer: Medicare Other | Admitting: Otolaryngology

## 2019-07-02 VITALS — Temp 97.3°F

## 2019-07-02 DIAGNOSIS — Z85810 Personal history of malignant neoplasm of tongue: Secondary | ICD-10-CM | POA: Diagnosis not present

## 2019-07-02 NOTE — Progress Notes (Signed)
HPI: Stacy Wagner is a 79 y.o. female who returns today for evaluation of follow-up on history of right lateral T1N0 squamous cell carcinoma of the tongue status post partial glossectomy in November 2019.  She has been doing well although she has slight soreness where she had the partial glossectomy performed.  This comes and goes..  Past Medical History:  Diagnosis Date  . AC (acromioclavicular) joint bone spurs   . Arthritis    "lower back" (01/15/2018)  . Back pain   . Bursitis disorder    bil shoulders  . Cataracts, bilateral   . Chronic lower back pain   . Complication of anesthesia    some nausea in past with anesthesia, pt. also recalls having the "tube removed too early & her her throat was full of mucous, states that she could move any part of her body)  . Family history of adverse reaction to anesthesia    "daughter gets PONV" (01/15/2018)  . GERD (gastroesophageal reflux disease)   . Hearing loss    bil / no hearing aids  . Hypertension   . Hypothyroidism   . Idiopathic scoliosis   . Leg pain   . Lumbar radiculopathy   . Lumbar stenosis with neurogenic claudication   . Migraine    "used to have them all the time; none in 10 yrs" (01/15/2018)  . Mouth sores   . Neck pain   . OA (osteoarthritis)    Multiple injections in lumbar region   . PONV (postoperative nausea and vomiting)   . Prolapse of female bladder, acquired   . Sagittal plane imbalance   . Skin cancer 11/2017   "left thigh"  . Thyroid disease   . Tongue cancer ([place])    12-2017   Past Surgical History:  Procedure Laterality Date  . ABDOMINAL HYSTERECTOMY     uterine prolapse  . ABDOMINOPLASTY     tummy tuck Emilio Aspen excess skin arms and legs  . APPENDECTOMY    . BACK SURGERY    . BREAST SURGERY Right 1980   removal of a nodule- post trauma, benign pathology  . CARPAL TUNNEL RELEASE Right   . CATARACT EXTRACTION, BILATERAL    . COLONOSCOPY    . GLOSSECTOMY N/A 01/15/2018   Procedure:  PARTIAL GLOSSECTOMY;  Surgeon: Rozetta Nunnery, MD;  Location: [facility] OR;  Service: ENT;  Laterality: N/A;  . LUMBAR SPINE SURGERY  1988   /w Dr. Joya Salm- reports that it was in the lumbar region, unsure of date.  Marland Kitchen NASAL POLYP EXCISION    . PARTIAL GLOSSECTOMY  01/15/2018  . SKIN CANCER EXCISION Left 11/2017   "thigh"  . TONGUE SURGERY    . TONSILLECTOMY    . TUMMY TUCK     Social History   Socioeconomic History  . Marital status: Married    Spouse name: Not on file  . Number of children: Not on file  . Years of education: Not on file  . Highest education level: Not on file  Occupational History  . Occupation: RETIRED  Tobacco Use  . Smoking status: Never Smoker  . Smokeless tobacco: Never Used  Substance and Sexual Activity  . Alcohol use: Never    Alcohol/week: 0.0 standard drinks  . Drug use: Never  . Sexual activity: Not Currently    Partners: Male  Other Topics Concern  . Not on file  Social History Narrative  . Not on file   Social Determinants of Health   Financial Resource Strain:   .  Difficulty of Paying Living Expenses:   Food Insecurity:   . Worried About Charity fundraiser in the Last Year:   . Arboriculturist in the Last Year:   Transportation Needs:   . Film/video editor (Medical):   Marland Kitchen Lack of Transportation (Non-Medical):   Physical Activity:   . Days of Exercise per Week:   . Minutes of Exercise per Session:   Stress:   . Feeling of Stress :   Social Connections:   . Frequency of Communication with Friends and Family:   . Frequency of Social Gatherings with Friends and Family:   . Attends Religious Services:   . Active Member of Clubs or Organizations:   . Attends Archivist Meetings:   Marland Kitchen Marital Status:    Family History  Problem Relation Age of Onset  . Cancer Mother        ?lung cancer  . Hypertension Sister   . Diabetes Father   . Stomach cancer Cousin   . Rectal cancer Neg Hx   . Esophageal cancer Neg Hx   .  Colon cancer Neg Hx    Allergies  Allergen Reactions  . Codeine Nausea Only   Prior to Admission medications   Medication Sig Start Date End Date Taking? Authorizing Provider  acetaminophen (TYLENOL) 500 MG tablet Take 1,000 mg by mouth every 6 (six) hours as needed for moderate pain or headache.    [provider]  Ascorbic Acid (VITAMIN C PO) Take by mouth daily.    [provider]  Calcium Carbonate-Vit D-Min (CALTRATE 600+D PLUS MINERALS PO) Take by mouth daily.    [provider]  cyclobenzaprine (FLEXERIL) 5 MG tablet Take 1 tablet (5 mg total) by mouth 3 (three) times daily as needed for muscle spasms. 11/21/18   Drenda Freeze, MD  esomeprazole (NEXIUM) 20 MG capsule Take 20 mg by mouth at bedtime.    [provider]  estrogens-methylTEST (ESTRATEST) 1.25-2.5 MG tablet TAKE 1 TABLET BY MOUTH DAILY 05/21/19   Burchette, Alinda Sierras, MD  ibuprofen (ADVIL,MOTRIN) 200 MG tablet Take 800 mg by mouth at bedtime as needed for headache or moderate pain.    [provider]  levothyroxine (SYNTHROID) 75 MCG tablet TAKE 1 TABLET BY MOUTH EVERY DAY ON AN EMPTY STOMACH 10/28/18   Burchette, Alinda Sierras, MD  LOTEMAX SM 0.38 % GEL as needed.  10/08/18   [provider]  meclizine (ANTIVERT) 25 MG tablet Take 1 tablet (25 mg total) by mouth 3 (three) times daily as needed for dizziness. 01/16/18   Rozetta Nunnery, MD  Multiple Vitamins-Minerals (OSTEO COMPLEX PO) Take by mouth daily.    [provider]  OVER THE COUNTER MEDICATION Take 4 tablets by mouth daily. Serovital Supplement    [provider]  OVER THE COUNTER MEDICATION Serovital daily for sleep    [provider]  Thiamine HCl (VITAMIN B-1 PO) Take by mouth daily.    [provider]  traMADol (ULTRAM) 50 MG tablet Take 50 mg by mouth every 6 (six) hours as needed. 04/29/19   [provider]  triamterene-hydrochlorothiazide (MAXZIDE) 75-50 MG tablet  TAKE 1 TABLET BY MOUTH DAILY WITH BREAKFAST 05/11/19 06/09/20  Burchette, Alinda Sierras, MD  VITAMIN D PO Take by mouth daily.    [provider]     Positive ROS: Otherwise negative  All other systems have been reviewed and were otherwise negative with the exception of those mentioned in  the HPI and as above.  Physical Exam: Constitutional: Alert, well-appearing, no acute distress Ears: External ears without lesions or tenderness. Ear canals are clear bilaterally with intact, clear TMs.  Nasal: External nose without lesions. Clear nasal passages Oral: Lips and gums without lesions.  Tongue appears clear today on examination she has a small defect where she had a partial glossectomy performed.  She has some scar tissue within this defect but everything is mucosal covered.  On palpation this area soft there is no leukoplakia no ulceration.  Floor of mouth is clear.  Posterior oropharynx is clear. Neck: There is no palpable adenopathy in the right neck or right semitubular area.  No palpable adenopathy on the left side of the neck either. Respiratory: Breathing comfortably  Skin: No facial/neck lesions or rash noted.  Procedures  Assessment: History of T1N0 squamous cell carcinoma of the right lateral tongue status post partial glossectomy performed in November 2019.  Plan: She will follow up in 5 months for recheck.  She will return earlier if she develops any worsening of the tongue soreness.   Radene Journey, MD

## 2019-07-06 DIAGNOSIS — D485 Neoplasm of uncertain behavior of skin: Secondary | ICD-10-CM | POA: Diagnosis not present

## 2019-07-06 DIAGNOSIS — D225 Melanocytic nevi of trunk: Secondary | ICD-10-CM | POA: Diagnosis not present

## 2019-07-06 DIAGNOSIS — L814 Other melanin hyperpigmentation: Secondary | ICD-10-CM | POA: Diagnosis not present

## 2019-07-06 DIAGNOSIS — L821 Other seborrheic keratosis: Secondary | ICD-10-CM | POA: Diagnosis not present

## 2019-07-06 DIAGNOSIS — L570 Actinic keratosis: Secondary | ICD-10-CM | POA: Diagnosis not present

## 2019-07-21 ENCOUNTER — Telehealth (HOSPITAL_COMMUNITY): Payer: Self-pay

## 2019-07-21 NOTE — Telephone Encounter (Signed)

## 2019-07-22 ENCOUNTER — Encounter: Payer: Self-pay | Admitting: Vascular Surgery

## 2019-07-22 ENCOUNTER — Other Ambulatory Visit: Payer: Self-pay

## 2019-07-22 ENCOUNTER — Ambulatory Visit (INDEPENDENT_AMBULATORY_CARE_PROVIDER_SITE_OTHER): Payer: Medicare Other | Admitting: Vascular Surgery

## 2019-07-22 VITALS — BP 109/70 | HR 100 | Temp 97.8°F | Resp 20 | Ht 68.0 in | Wt 155.0 lb

## 2019-07-22 DIAGNOSIS — M4120 Other idiopathic scoliosis, site unspecified: Secondary | ICD-10-CM | POA: Diagnosis not present

## 2019-07-22 NOTE — H&P (View-Only) (Signed)
REASON FOR CONSULT:    To evaluate for anterior retroperitoneal exposure of L5-S1.  The consult is requested by Dr. Vertell Limber.  ASSESSMENT & PLAN:   SCOLIOSIS: Dr. Vertell Limber is recommended anterior lumbar interbody fusion at L5-S1 in addition to a right sided XLIF at multiple levels.  We have been asked to provide anterior retroperitoneal exposure of L5-S1.  I think she is a good candidate for this. I have reviewed our role in exposure of the spine in order to allow anterior lumbar interbody fusion at the appropriate levels. We have discussed the potential complications of surgery, including but not limited to, arterial or venous injury, thrombosis, or bleeding. We have also discussed the potential risks of wound healing problems, the development of a hernia, nerve injury, leg swelling, or other unpredictable medical problems.  All the patient's questions were answered and they are agreeable to proceed.  Deitra Mayo, MD Office: [phone]   HPI:   Stacy Wagner is a pleasant 79 y.o. female, with a long history of back pain and scoliosis.  She is failed conservative treatment including multiple injections and physical therapy.  She has disabling pain and has been evaluated by Dr. Vertell Limber who feels like she is a candidate for anterior lumbar interbody fusion at L5-S1 in addition to a right sided XLIF at multiple levels.  We have been asked to provide anterior retroperitoneal exposure of L5-S1.  She has had a previous hysterectomy that she said was done laparoscopically.  In addition she has had a tummy tuck.  She states that at one point she weighed 285 pounds but has lost significant weight and therefore underwent a tummy tuck to excise the redundant tissue.  Her only real risk factor for peripheral vascular disease is hypertension.  She denies any history of diabetes, hypercholesterolemia, family history of premature cardiovascular disease or tobacco use.  She denies any history of  claudication, rest pain, or nonhealing ulcers.  She has had no previous history of DVT.    Past Medical History:  Diagnosis Date  . AC (acromioclavicular) joint bone spurs   . Arthritis    "lower back" (01/15/2018)  . Back pain   . Bursitis disorder    bil shoulders  . Cataracts, bilateral   . Chronic lower back pain   . Complication of anesthesia    some nausea in past with anesthesia, pt. also recalls having the "tube removed too early & her her throat was full of mucous, states that she could move any part of her body)  . Family history of adverse reaction to anesthesia    "daughter gets PONV" (01/15/2018)  . GERD (gastroesophageal reflux disease)   . Hearing loss    bil / no hearing aids  . Hypertension   . Hypothyroidism   . Idiopathic scoliosis   . Leg pain   . Lumbar radiculopathy   . Lumbar stenosis with neurogenic claudication   . Migraine    "used to have them all the time; none in 10 yrs" (01/15/2018)  . Mouth sores   . Neck pain   . OA (osteoarthritis)    Multiple injections in lumbar region   . PONV (postoperative nausea and vomiting)   . Prolapse of female bladder, acquired   . Sagittal plane imbalance   . Skin cancer 11/2017   "left thigh"  . Thyroid disease   . Tongue cancer ([place])    12-2017    Family History  Problem Relation Age of Onset  . Cancer  Mother        ?lung cancer  . Hypertension Sister   . Diabetes Father   . Stomach cancer Cousin   . Rectal cancer Neg Hx   . Esophageal cancer Neg Hx   . Colon cancer Neg Hx     SOCIAL HISTORY: Social History   Socioeconomic History  . Marital status: Married    Spouse name: Not on file  . Number of children: Not on file  . Years of education: Not on file  . Highest education level: Not on file  Occupational History  . Occupation: RETIRED  Tobacco Use  . Smoking status: Never Smoker  . Smokeless tobacco: Never Used  Substance and Sexual Activity  . Alcohol use: Never    Alcohol/week:  0.0 standard drinks  . Drug use: Never  . Sexual activity: Not Currently    Partners: Male  Other Topics Concern  . Not on file  Social History Narrative  . Not on file   Social Determinants of Health   Financial Resource Strain:   . Difficulty of Paying Living Expenses:   Food Insecurity:   . Worried About Charity fundraiser in the Last Year:   . Arboriculturist in the Last Year:   Transportation Needs:   . Film/video editor (Medical):   Marland Kitchen Lack of Transportation (Non-Medical):   Physical Activity:   . Days of Exercise per Week:   . Minutes of Exercise per Session:   Stress:   . Feeling of Stress :   Social Connections:   . Frequency of Communication with Friends and Family:   . Frequency of Social Gatherings with Friends and Family:   . Attends Religious Services:   . Active Member of Clubs or Organizations:   . Attends Archivist Meetings:   Marland Kitchen Marital Status:   Intimate Partner Violence:   . Fear of Current or Ex-Partner:   . Emotionally Abused:   Marland Kitchen Physically Abused:   . Sexually Abused:     Allergies  Allergen Reactions  . Codeine Nausea Only    Current Outpatient Medications  Medication Sig Dispense Refill  . acetaminophen (TYLENOL) 500 MG tablet Take 1,000 mg by mouth every 6 (six) hours as needed for moderate pain or headache.    . Ascorbic Acid (VITAMIN C PO) Take by mouth daily.    . Calcium Carbonate-Vit D-Min (CALTRATE 600+D PLUS MINERALS PO) Take by mouth daily.    . cyclobenzaprine (FLEXERIL) 5 MG tablet Take 1 tablet (5 mg total) by mouth 3 (three) times daily as needed for muscle spasms. 10 tablet 0  . esomeprazole (NEXIUM) 20 MG capsule Take 20 mg by mouth at bedtime.    Marland Kitchen estrogens-methylTEST (ESTRATEST) 1.25-2.5 MG tablet TAKE 1 TABLET BY MOUTH DAILY 90 tablet 1  . ibuprofen (ADVIL,MOTRIN) 200 MG tablet Take 800 mg by mouth at bedtime as needed for headache or moderate pain.    Marland Kitchen levothyroxine (SYNTHROID) 75 MCG tablet TAKE 1  TABLET BY MOUTH EVERY DAY ON AN EMPTY STOMACH 90 tablet 3  . LOTEMAX SM 0.38 % GEL as needed.     . meclizine (ANTIVERT) 25 MG tablet Take 1 tablet (25 mg total) by mouth 3 (three) times daily as needed for dizziness. 30 tablet 0  . Multiple Vitamins-Minerals (OSTEO COMPLEX PO) Take by mouth daily.    Marland Kitchen OVER THE COUNTER MEDICATION Take 4 tablets by mouth daily. Serovital Supplement    . OVER THE COUNTER MEDICATION  Serovital daily for sleep    . Thiamine HCl (VITAMIN B-1 PO) Take by mouth daily.    . traMADol (ULTRAM) 50 MG tablet Take 50 mg by mouth every 6 (six) hours as needed.    . triamcinolone cream (KENALOG) 0.1 % APPLY 1 APPLICATION ONTO THE SKIN TWICE DAILY FOR 2 WEEKS AS NEEDED FLARES    . triamterene-hydrochlorothiazide (MAXZIDE) 75-50 MG tablet TAKE 1 TABLET BY MOUTH DAILY WITH BREAKFAST 90 tablet 1  . VITAMIN D PO Take by mouth daily.     No current facility-administered medications for this visit.    REVIEW OF SYSTEMS:  [X]  denotes positive finding, [ ]  denotes negative finding Cardiac  Comments:  Chest pain or chest pressure:    Shortness of breath upon exertion:    Short of breath when lying flat:    Irregular heart rhythm:        Vascular    Pain in calf, thigh, or hip brought on by ambulation:    Pain in feet at night that wakes you up from your sleep:     Blood clot in your veins:    Leg swelling:         Pulmonary    Oxygen at home:    Productive cough:     Wheezing:         Neurologic    Sudden weakness in arms or legs:     Sudden numbness in arms or legs:     Sudden onset of difficulty speaking or slurred speech:    Temporary loss of vision in one eye:     Problems with dizziness:         Gastrointestinal    Blood in stool:     Vomited blood:         Genitourinary    Burning when urinating:     Blood in urine:        Psychiatric    Major depression:         Hematologic    Bleeding problems:    Problems with blood clotting too easily:         Skin    Rashes or ulcers:        Constitutional    Fever or chills:     PHYSICAL EXAM:   Vitals:   07/22/19 1459  BP: 109/70  Pulse: 100  Resp: 20  Temp: 97.8 F (36.6 C)  SpO2: 97%  Weight: 155 lb (70.3 kg)  Height: 5\' 8"  (1.727 m)    GENERAL: The patient is a well-nourished female, in no acute distress. The vital signs are documented above. CARDIAC: There is a regular rate and rhythm.  VASCULAR: I do not detect carotid bruits. He has palpable femoral pulses and palpable pedal pulses bilaterally. She has no significant lower extremity swelling. PULMONARY: There is good air exchange bilaterally without wheezing or rales. ABDOMEN: Soft and non-tender with normal pitched bowel sounds.  MUSCULOSKELETAL: There are no major deformities or cyanosis. NEUROLOGIC: No focal weakness or paresthesias are detected. SKIN: There are no ulcers or rashes noted. PSYCHIATRIC: The patient has a normal affect.  DATA:    MRI: Reviewed her MRI that was done on 06/07/2019.  This shows advanced degenerative disc disease with scoliosis and reversal of lumbar lordosis.  I did review the images and do not see any complicating factors from a vascular standpoint.  I did not see any calcific disease in the iliac arteries.

## 2019-07-22 NOTE — Progress Notes (Signed)
REASON FOR CONSULT:    To evaluate for anterior retroperitoneal exposure of L5-S1.  The consult is requested by Dr. Vertell Limber.  ASSESSMENT & PLAN:   SCOLIOSIS: Dr. Vertell Limber is recommended anterior lumbar interbody fusion at L5-S1 in addition to a right sided XLIF at multiple levels.  We have been asked to provide anterior retroperitoneal exposure of L5-S1.  I think she is a good candidate for this. I have reviewed our role in exposure of the spine in order to allow anterior lumbar interbody fusion at the appropriate levels. We have discussed the potential complications of surgery, including but not limited to, arterial or venous injury, thrombosis, or bleeding. We have also discussed the potential risks of wound healing problems, the development of a hernia, nerve injury, leg swelling, or other unpredictable medical problems.  All the patient's questions were answered and they are agreeable to proceed.  Deitra Mayo, MD Office: [phone]   HPI:   Stacy Wagner is a pleasant 79 y.o. female, with a long history of back pain and scoliosis.  She is failed conservative treatment including multiple injections and physical therapy.  She has disabling pain and has been evaluated by Dr. Vertell Limber who feels like she is a candidate for anterior lumbar interbody fusion at L5-S1 in addition to a right sided XLIF at multiple levels.  We have been asked to provide anterior retroperitoneal exposure of L5-S1.  She has had a previous hysterectomy that she said was done laparoscopically.  In addition she has had a tummy tuck.  She states that at one point she weighed 285 pounds but has lost significant weight and therefore underwent a tummy tuck to excise the redundant tissue.  Her only real risk factor for peripheral vascular disease is hypertension.  She denies any history of diabetes, hypercholesterolemia, family history of premature cardiovascular disease or tobacco use.  She denies any history of  claudication, rest pain, or nonhealing ulcers.  She has had no previous history of DVT.    Past Medical History:  Diagnosis Date  . AC (acromioclavicular) joint bone spurs   . Arthritis    "lower back" (01/15/2018)  . Back pain   . Bursitis disorder    bil shoulders  . Cataracts, bilateral   . Chronic lower back pain   . Complication of anesthesia    some nausea in past with anesthesia, pt. also recalls having the "tube removed too early & her her throat was full of mucous, states that she could move any part of her body)  . Family history of adverse reaction to anesthesia    "daughter gets PONV" (01/15/2018)  . GERD (gastroesophageal reflux disease)   . Hearing loss    bil / no hearing aids  . Hypertension   . Hypothyroidism   . Idiopathic scoliosis   . Leg pain   . Lumbar radiculopathy   . Lumbar stenosis with neurogenic claudication   . Migraine    "used to have them all the time; none in 10 yrs" (01/15/2018)  . Mouth sores   . Neck pain   . OA (osteoarthritis)    Multiple injections in lumbar region   . PONV (postoperative nausea and vomiting)   . Prolapse of female bladder, acquired   . Sagittal plane imbalance   . Skin cancer 11/2017   "left thigh"  . Thyroid disease   . Tongue cancer ([place])    12-2017    Family History  Problem Relation Age of Onset  . Cancer  Mother        ?lung cancer  . Hypertension Sister   . Diabetes Father   . Stomach cancer Cousin   . Rectal cancer Neg Hx   . Esophageal cancer Neg Hx   . Colon cancer Neg Hx     SOCIAL HISTORY: Social History   Socioeconomic History  . Marital status: Married    Spouse name: Not on file  . Number of children: Not on file  . Years of education: Not on file  . Highest education level: Not on file  Occupational History  . Occupation: RETIRED  Tobacco Use  . Smoking status: Never Smoker  . Smokeless tobacco: Never Used  Substance and Sexual Activity  . Alcohol use: Never    Alcohol/week:  0.0 standard drinks  . Drug use: Never  . Sexual activity: Not Currently    Partners: Male  Other Topics Concern  . Not on file  Social History Narrative  . Not on file   Social Determinants of Health   Financial Resource Strain:   . Difficulty of Paying Living Expenses:   Food Insecurity:   . Worried About Charity fundraiser in the Last Year:   . Arboriculturist in the Last Year:   Transportation Needs:   . Film/video editor (Medical):   Marland Kitchen Lack of Transportation (Non-Medical):   Physical Activity:   . Days of Exercise per Week:   . Minutes of Exercise per Session:   Stress:   . Feeling of Stress :   Social Connections:   . Frequency of Communication with Friends and Family:   . Frequency of Social Gatherings with Friends and Family:   . Attends Religious Services:   . Active Member of Clubs or Organizations:   . Attends Archivist Meetings:   Marland Kitchen Marital Status:   Intimate Partner Violence:   . Fear of Current or Ex-Partner:   . Emotionally Abused:   Marland Kitchen Physically Abused:   . Sexually Abused:     Allergies  Allergen Reactions  . Codeine Nausea Only    Current Outpatient Medications  Medication Sig Dispense Refill  . acetaminophen (TYLENOL) 500 MG tablet Take 1,000 mg by mouth every 6 (six) hours as needed for moderate pain or headache.    . Ascorbic Acid (VITAMIN C PO) Take by mouth daily.    . Calcium Carbonate-Vit D-Min (CALTRATE 600+D PLUS MINERALS PO) Take by mouth daily.    . cyclobenzaprine (FLEXERIL) 5 MG tablet Take 1 tablet (5 mg total) by mouth 3 (three) times daily as needed for muscle spasms. 10 tablet 0  . esomeprazole (NEXIUM) 20 MG capsule Take 20 mg by mouth at bedtime.    Marland Kitchen estrogens-methylTEST (ESTRATEST) 1.25-2.5 MG tablet TAKE 1 TABLET BY MOUTH DAILY 90 tablet 1  . ibuprofen (ADVIL,MOTRIN) 200 MG tablet Take 800 mg by mouth at bedtime as needed for headache or moderate pain.    Marland Kitchen levothyroxine (SYNTHROID) 75 MCG tablet TAKE 1  TABLET BY MOUTH EVERY DAY ON AN EMPTY STOMACH 90 tablet 3  . LOTEMAX SM 0.38 % GEL as needed.     . meclizine (ANTIVERT) 25 MG tablet Take 1 tablet (25 mg total) by mouth 3 (three) times daily as needed for dizziness. 30 tablet 0  . Multiple Vitamins-Minerals (OSTEO COMPLEX PO) Take by mouth daily.    Marland Kitchen OVER THE COUNTER MEDICATION Take 4 tablets by mouth daily. Serovital Supplement    . OVER THE COUNTER MEDICATION  Serovital daily for sleep    . Thiamine HCl (VITAMIN B-1 PO) Take by mouth daily.    . traMADol (ULTRAM) 50 MG tablet Take 50 mg by mouth every 6 (six) hours as needed.    . triamcinolone cream (KENALOG) 0.1 % APPLY 1 APPLICATION ONTO THE SKIN TWICE DAILY FOR 2 WEEKS AS NEEDED FLARES    . triamterene-hydrochlorothiazide (MAXZIDE) 75-50 MG tablet TAKE 1 TABLET BY MOUTH DAILY WITH BREAKFAST 90 tablet 1  . VITAMIN D PO Take by mouth daily.     No current facility-administered medications for this visit.    REVIEW OF SYSTEMS:  [X]  denotes positive finding, [ ]  denotes negative finding Cardiac  Comments:  Chest pain or chest pressure:    Shortness of breath upon exertion:    Short of breath when lying flat:    Irregular heart rhythm:        Vascular    Pain in calf, thigh, or hip brought on by ambulation:    Pain in feet at night that wakes you up from your sleep:     Blood clot in your veins:    Leg swelling:         Pulmonary    Oxygen at home:    Productive cough:     Wheezing:         Neurologic    Sudden weakness in arms or legs:     Sudden numbness in arms or legs:     Sudden onset of difficulty speaking or slurred speech:    Temporary loss of vision in one eye:     Problems with dizziness:         Gastrointestinal    Blood in stool:     Vomited blood:         Genitourinary    Burning when urinating:     Blood in urine:        Psychiatric    Major depression:         Hematologic    Bleeding problems:    Problems with blood clotting too easily:          Skin    Rashes or ulcers:        Constitutional    Fever or chills:     PHYSICAL EXAM:   Vitals:   07/22/19 1459  BP: 109/70  Pulse: 100  Resp: 20  Temp: 97.8 F (36.6 C)  SpO2: 97%  Weight: 155 lb (70.3 kg)  Height: 5\' 8"  (1.727 m)    GENERAL: The patient is a well-nourished female, in no acute distress. The vital signs are documented above. CARDIAC: There is a regular rate and rhythm.  VASCULAR: I do not detect carotid bruits. He has palpable femoral pulses and palpable pedal pulses bilaterally. She has no significant lower extremity swelling. PULMONARY: There is good air exchange bilaterally without wheezing or rales. ABDOMEN: Soft and non-tender with normal pitched bowel sounds.  MUSCULOSKELETAL: There are no major deformities or cyanosis. NEUROLOGIC: No focal weakness or paresthesias are detected. SKIN: There are no ulcers or rashes noted. PSYCHIATRIC: The patient has a normal affect.  DATA:    MRI: Reviewed her MRI that was done on 06/07/2019.  This shows advanced degenerative disc disease with scoliosis and reversal of lumbar lordosis.  I did review the images and do not see any complicating factors from a vascular standpoint.  I did not see any calcific disease in the iliac arteries.

## 2019-07-30 NOTE — H&P (Signed)
Patient ID:   [id number] Patient: Stacy Wagner  Date of Birth: 06/10/1940 Visit Type: Office Visit   Date: 06/08/2019 01:30 PM Provider: Marchia Meiers. Vertell Limber MD   This 79 year old female presents for MRI Review.  HISTORY OF PRESENT ILLNESS: 1.  MRI Review  Patient returns to review her MRI.  The patient's MRI is not significantly changed without evidence of new significant spinal stenosis or disc herniations.  The patient became tearful again today and says that she is very tired of pain.  She says that the only way she can get relief is to sit down.  She grades her pain at 2 to 3/10 but 10/10 with walking and says that she can only stand for 5 minutes at a time before she has to sit down due to the severity of her back pain.  She says that she has been in pain for over 7 years and it is not improving.  Once again we reviewed her imaging and we discussed her situation.  She has sagittal imbalance with S VA of 80 mm and coronal curve of 30 with a PI minus LL mismatch of 21  Her review of radiograph shows a lateral osteophyte at L3-4 which may suggest fusion at this level  From a surgical standpoint I would recommend anterior lumbar interbody fusion at L5-S1 with right sided XL IF at L4-5, L3-4, L2-3 and possibly L1-2 levels as stage I correction and the 2nd stage would consist of T10 to pelvis fixation with Airo with possible osteotomies depending on the degree of sagittal correction we are able to obtain during stage I.  The patient will be fitted for a TLSO brace.  She wants to go ahead with surgery.  She says that she is not able to continue to live in her current condition with any quality of life      Medical/Surgical/Interim History Reviewed, no change.     PAST MEDICAL HISTORY, SURGICAL HISTORY, FAMILY HISTORY, SOCIAL HISTORY AND REVIEW OF SYSTEMS I have reviewed the patient's past medical, surgical, family and social history as well as the comprehensive review of systems  as included on the [facility] history form dated 12/19/2018, which I have signed.  Family History: Reviewed, no changes.  Last detailed document date:05/20/2017.   Social History: Reviewed, no changes. Last detailed document date: 05/20/2017.    MEDICATIONS: (added, continued or stopped this visit) Started Medication Directions Instruction Stopped  esterified estrogens-methyltestosterone 1.25 mg-2.5 mg tablet     levothyroxine 75 mcg tablet     Nexium take 1 capsule by oral route  every day    one a day vitamin  ORAL    04/29/2019 tramadol 50 mg tablet take 1 tablet by oral route  every 6 hours as needed    triamterene 50 mg-hydrochlorothiazide 25 mg capsule       ALLERGIES: Ingredient Reaction Medication Name Comment CODEINE Nausea    Reviewed, no changes.    PHYSICAL EXAM:  Vitals Date Temp F BP Pulse Ht In Wt Lb BMI BSA Pain Score 06/08/2019 96.8 150/74 73 68 157 23.87  3/10     IMPRESSION:  Scoliosis with intractable back pain.    PLAN: Proceed with correction of spinal deformity.  Once again I spent considerable time educating the patient and discussing her situation in terms of surgical options and alternatives.  She wishes to proceed with surgery  Orders: Office Procedures/Services: Assessment Service Comments M41.20 TLSO brace   Instruction(s)/Education: Assessment Instruction  I10 Lifestyle education  Completed Orders (this encounter) Order Details Reason Side Interpretation Result Initial Treatment Date Region Lifestyle education Patient will follow up with Primary Care Physician.        Assessment/Plan  # Detail Type Description  1. Assessment Other idiopathic scoliosis, site unspecified (M41.20).  Plan Orders TLSO brace.     2. Assessment Lumbar stenosis with neurogenic claudication (M48.062).     3. Assessment Lumbar  radiculopathy (M54.16).     4. Assessment Sagittal plane imbalance (M43.8X9).     5. Assessment Low back pain, unspecified back pain laterality, with sciatica presence unspecified (M54.5).     6. Assessment Essential (primary) hypertension (I10).       Pain Management Plan Pain Scale: 3/10. Method: Numeric Pain Intensity Scale. Location: back. Onset: 04/08/2012. Duration: varies. Quality: discomforting. Pain management follow-up plan of care: Patient will continue medication management..              Provider:  Marchia Meiers. Vertell Limber MD  06/09/2019 06:23 PM    Dictation edited by: Marchia Meiers. Vertell Limber    CC Providers: PCP  None   Sherley Bounds MD  [address] [place], [place] [address]               Electronically signed by Marchia Meiers. Vertell Limber MD on 06/09/2019 06:23 PM

## 2019-07-30 NOTE — Pre-Procedure Instructions (Signed)
Your procedure is scheduled on Tuesday, August 04, 2019 from 07:30 AM- 2:05 PM.  Report to [facility] Main Entrance "A" at 05:30 A.M., and check in at the Admitting office.  Call this number if you have problems the morning of surgery:  [phone]  Call [phone] if you have any questions prior to your surgery date Monday-Friday 8am-4pm    Remember:  Do not eat or drink after midnight the night before your surgery.     Take these medicines the morning of surgery with A SIP OF WATER: estrogens-methylTEST (ESTRATEST) levothyroxine (SYNTHROID)  IF NEEDED: acetaminophen (TYLENOL) cyclobenzaprine (FLEXERIL) meclizine (ANTIVERT)  traMADol (ULTRAM)  As of today, STOP taking any Aspirin (unless otherwise instructed by your surgeon) and Aspirin containing products, Aleve, Naproxen, Ibuprofen, Motrin, Advil, Goody's, BC's, all herbal medications, fish oil, and all vitamins.           The Morning of Surgery:            Do not wear jewelry, make up, or nail polish.            Do not wear lotions, powders, perfumes, or deodorant.            Do not shave 48 hours prior to surgery.              Do not bring valuables to the hospital.            [facility] is not responsible for any belongings or valuables.  Do NOT Smoke (Tobacco/Vapping) or drink Alcohol 24 hours prior to your procedure.  If you use a CPAP at night, you may bring all equipment for your overnight stay.   Contacts, glasses, dentures or bridgework may not be worn into surgery.      For patients admitted to the hospital, discharge time will be determined by your treatment team.   Patients discharged the day of surgery will not be allowed to drive home, and someone needs to stay with them for 24 hours.    Special instructions:   [facility]- Preparing For Surgery  Before surgery, you can play an important role. Because skin is not sterile, your skin needs to be as free of germs as possible. You can reduce the number  of germs on your skin by washing with CHG (chlorahexidine gluconate) Soap before surgery.  CHG is an antiseptic cleaner which kills germs and bonds with the skin to continue killing germs even after washing.    Oral Hygiene is also important to reduce your risk of infection.  Remember - BRUSH YOUR TEETH THE MORNING OF SURGERY WITH YOUR REGULAR TOOTHPASTE  Please do not use if you have an allergy to CHG or antibacterial soaps. If your skin becomes reddened/irritated stop using the CHG.  Do not shave (including legs and underarms) for at least 48 hours prior to first CHG shower. It is OK to shave your face.  Please follow these instructions carefully.   1. Shower the NIGHT BEFORE SURGERY and the MORNING OF SURGERY with CHG Soap.   2. If you chose to wash your hair, wash your hair first as usual with your normal shampoo.  3. After you shampoo, rinse your hair and body thoroughly to remove the shampoo.  4. Use CHG as you would any other liquid soap. You can apply CHG directly to the skin and wash gently with a scrungie or a clean washcloth.   5. Apply the CHG Soap to your body ONLY FROM THE  NECK DOWN.  Do not use on open wounds or open sores. Avoid contact with your eyes, ears, mouth and genitals (private parts). Wash Face and genitals (private parts)  with your normal soap.   6. Wash thoroughly, paying special attention to the area where your surgery will be performed.  7. Thoroughly rinse your body with warm water from the neck down.  8. DO NOT shower/wash with your normal soap after using and rinsing off the CHG Soap.  9. Pat yourself dry with a CLEAN TOWEL.  10. Wear CLEAN PAJAMAS to bed the night before surgery, wear comfortable clothes the morning of surgery  11. Place CLEAN SHEETS on your bed the night of your first shower and DO NOT SLEEP WITH PETS.   Day of Surgery: Shower with CHG Soap.  Do not apply any deodorants/lotions.  Please wear clean clothes to the hospital/surgery  center.   Remember to brush your teeth WITH YOUR REGULAR TOOTHPASTE.   Please read over the following fact sheets that you were given.

## 2019-07-30 NOTE — H&P (Signed)
Patient ID:   [id number] Patient: Stacy Wagner  Date of Birth: 1940/07/17 Visit Type: Office Visit   Date: 06/08/2019 01:30 PM Provider: Marchia Meiers. Vertell Limber MD   This 79 year old female presents for MRI Review.  HISTORY OF PRESENT ILLNESS: 1.  MRI Review  Patient returns to review her MRI.  The patient's MRI is not significantly changed without evidence of new significant spinal stenosis or disc herniations.  The patient became tearful again today and says that she is very tired of pain.  She says that the only way she can get relief is to sit down.  She grades her pain at 2 to 3/10 but 10/10 with walking and says that she can only stand for 5 minutes at a time before she has to sit down due to the severity of her back pain.  She says that she has been in pain for over 7 years and it is not improving.  Once again we reviewed her imaging and we discussed her situation.  She has sagittal imbalance with S VA of 80 mm and coronal curve of 30 with a PI minus LL mismatch of 21  Her review of radiograph shows a lateral osteophyte at L3-4 which may suggest fusion at this level  From a surgical standpoint I would recommend anterior lumbar interbody fusion at L5-S1 with right sided XL IF at L4-5, L3-4, L2-3 and possibly L1-2 levels as stage I correction and the 2nd stage would consist of T10 to pelvis fixation with Airo with possible osteotomies depending on the degree of sagittal correction we are able to obtain during stage I.  The patient will be fitted for a TLSO brace.  She wants to go ahead with surgery.  She says that she is not able to continue to live in her current condition with any quality of life      Medical/Surgical/Interim History Reviewed, no change.     PAST MEDICAL HISTORY, SURGICAL HISTORY, FAMILY HISTORY, SOCIAL HISTORY AND REVIEW OF SYSTEMS I have reviewed the patient's past medical, surgical, family and social history as well as the comprehensive review of systems  as included on the [facility] history form dated 12/19/2018, which I have signed.  Family History: Reviewed, no changes.  Last detailed document date:05/20/2017.   Social History: Reviewed, no changes. Last detailed document date: 05/20/2017.    MEDICATIONS: (added, continued or stopped this visit) Started Medication Directions Instruction Stopped  esterified estrogens-methyltestosterone 1.25 mg-2.5 mg tablet     levothyroxine 75 mcg tablet     Nexium take 1 capsule by oral route  every day    one a day vitamin  ORAL    04/29/2019 tramadol 50 mg tablet take 1 tablet by oral route  every 6 hours as needed    triamterene 50 mg-hydrochlorothiazide 25 mg capsule       ALLERGIES: Ingredient Reaction Medication Name Comment CODEINE Nausea    Reviewed, no changes.    PHYSICAL EXAM:  Vitals Date Temp F BP Pulse Ht In Wt Lb BMI BSA Pain Score 06/08/2019 96.8 150/74 73 68 157 23.87  3/10     IMPRESSION:  Scoliosis with intractable back pain.    PLAN: Proceed with correction of spinal deformity.  Once again I spent considerable time educating the patient and discussing her situation in terms of surgical options and alternatives.  She wishes to proceed with surgery  Orders: Office Procedures/Services: Assessment Service Comments M41.20 TLSO brace   Instruction(s)/Education: Assessment Instruction  I10 Lifestyle education  Completed Orders (this encounter) Order Details Reason Side Interpretation Result Initial Treatment Date Region Lifestyle education Patient will follow up with Primary Care Physician.        Assessment/Plan  # Detail Type Description  1. Assessment Other idiopathic scoliosis, site unspecified (M41.20).  Plan Orders TLSO brace.     2. Assessment Lumbar stenosis with neurogenic claudication (M48.062).     3. Assessment Lumbar  radiculopathy (M54.16).     4. Assessment Sagittal plane imbalance (M43.8X9).     5. Assessment Low back pain, unspecified back pain laterality, with sciatica presence unspecified (M54.5).     6. Assessment Essential (primary) hypertension (I10).       Pain Management Plan Pain Scale: 3/10. Method: Numeric Pain Intensity Scale. Location: back. Onset: 04/08/2012. Duration: varies. Quality: discomforting. Pain management follow-up plan of care: Patient will continue medication management..              Provider:  Marchia Meiers. Vertell Limber MD  06/09/2019 06:23 PM    Dictation edited by: Marchia Meiers. Vertell Limber    CC Providers: PCP  None   Sherley Bounds MD  [address] [place], [place] [address]               Electronically signed by Marchia Meiers. Vertell Limber MD on 06/09/2019 06:23 PM

## 2019-07-31 ENCOUNTER — Other Ambulatory Visit (HOSPITAL_COMMUNITY)
Admission: RE | Admit: 2019-07-31 | Discharge: 2019-07-31 | Disposition: A | Discharge status: Home / Self Care | Payer: Medicare Other | Source: Ambulatory Visit | Attending: Neurosurgery | Admitting: Neurosurgery

## 2019-07-31 ENCOUNTER — Encounter (HOSPITAL_COMMUNITY): Payer: Self-pay

## 2019-07-31 ENCOUNTER — Other Ambulatory Visit: Payer: Self-pay

## 2019-07-31 ENCOUNTER — Encounter (HOSPITAL_COMMUNITY)
Admission: RE | Admit: 2019-07-31 | Discharge: 2019-07-31 | Disposition: A | Discharge status: Home / Self Care | Payer: Medicare Other | Source: Ambulatory Visit | Attending: Neurosurgery | Admitting: Neurosurgery

## 2019-07-31 DIAGNOSIS — Z01818 Encounter for other preprocedural examination: Secondary | ICD-10-CM | POA: Diagnosis present

## 2019-07-31 DIAGNOSIS — Z20822 Contact with and (suspected) exposure to covid-19: Secondary | ICD-10-CM | POA: Diagnosis not present

## 2019-07-31 DIAGNOSIS — I10 Essential (primary) hypertension: Secondary | ICD-10-CM | POA: Insufficient documentation

## 2019-07-31 LAB — CBC
HCT: 42.4 % (ref 36.0–46.0)
Hemoglobin: 14.2 g/dL (ref 12.0–15.0)
MCH: 30.9 pg (ref 26.0–34.0)
MCHC: 33.5 g/dL (ref 30.0–36.0)
MCV: 92.4 fL (ref 80.0–100.0)
Platelets: 302 10*3/uL (ref 150–400)
RBC: 4.59 MIL/uL (ref 3.87–5.11)
RDW: 12.5 % (ref 11.5–15.5)
WBC: 6.7 10*3/uL (ref 4.0–10.5)
nRBC: 0 % (ref 0.0–0.2)

## 2019-07-31 LAB — BASIC METABOLIC PANEL
Anion gap: 8 (ref 5–15)
BUN: 21 mg/dL (ref 8–23)
CO2: 24 mmol/L (ref 22–32)
Calcium: 9.1 mg/dL (ref 8.9–10.3)
Chloride: 108 mmol/L (ref 98–111)
Creatinine, Ser: 0.77 mg/dL (ref 0.44–1.00)
GFR calc Af Amer: >60 mL/min (ref >60)
GFR calc non Af Amer: >60 mL/min (ref >60)
Glucose, Bld: 79 mg/dL (ref 70–99)
Potassium: 3.8 mmol/L (ref 3.5–5.1)
Sodium: 140 mmol/L (ref 135–145)

## 2019-07-31 LAB — ABO/RH: ABO/RH(D): O POS

## 2019-07-31 LAB — SURGICAL PCR SCREEN
MRSA, PCR: NEGATIVE
Staphylococcus aureus: POSITIVE — AB

## 2019-07-31 LAB — TYPE AND SCREEN
ABO/RH(D): O POS
Antibody Screen: NEGATIVE

## 2019-07-31 NOTE — Progress Notes (Addendum)
PCP:  Carolann Littler, MD Cardiologist:  Denies  EKG:  07/31/19 CXR:  N/A ECHO:  Denies Stress Test:  Denies Cardiac Cath:  Denies  Covid test 07/31/19  Anesthesia Review:  Yes, abnormal EKG.  Patient denies shortness of breath, fever, cough, and chest pain at PAT appointment.  Patient verbalized understanding of instructions provided today at the PAT appointment.  Patient asked to review instructions at home and day of surgery.

## 2019-08-01 LAB — SARS CORONAVIRUS 2 (TAT 6-24 HRS): SARS Coronavirus 2: NEGATIVE

## 2019-08-03 NOTE — Anesthesia Preprocedure Evaluation (Addendum)
Anesthesia Evaluation  Patient identified by MRN, date of birth, ID band Patient awake    Reviewed: Allergy & Precautions, H&P , NPO status , Patient's Chart, lab work & pertinent test results  History of Anesthesia Complications (+) PONV and Family history of anesthesia reaction  Airway Mallampati: II  TM Distance: >3 FB Neck ROM: Full    Dental no notable dental hx. (+) Teeth Intact, Dental Advisory Given   Pulmonary neg pulmonary ROS,    Pulmonary exam normal breath sounds clear to auscultation       Cardiovascular Exercise Tolerance: Good hypertension, Pt. on medications  Rhythm:Regular Rate:Normal     Neuro/Psych  Headaches, negative psych ROS   GI/Hepatic Neg liver ROS, GERD  Medicated,  Endo/Other  Hypothyroidism   Renal/GU negative Renal ROS  negative genitourinary   Musculoskeletal  (+) Arthritis , Osteoarthritis,    Abdominal   Peds  Hematology negative hematology ROS (+)   Anesthesia Other Findings   Reproductive/Obstetrics negative OB ROS                           Anesthesia Physical Anesthesia Plan  ASA: II  Anesthesia Plan: General   Post-op Pain Management:    Induction: Intravenous  PONV Risk Score and Plan: 4 or greater and Ondansetron, Dexamethasone and Propofol infusion  Airway Management Planned: Oral ETT  Additional Equipment: Arterial line  Intra-op Plan:   Post-operative Plan: Extubation in OR  Informed Consent: I have reviewed the patients History and Physical, chart, labs and discussed the procedure including the risks, benefits and alternatives for the proposed anesthesia with the patient or authorized representative who has indicated his/her understanding and acceptance.     Dental advisory given  Plan Discussed with: CRNA  Anesthesia Plan Comments: (PAT note by Karoline Caldwell, PA-C: Hx of partial glossectomy 01/15/18 for squamous cell  carcinoma. Last seen by Dr. Lucia Gaskins 07/02/18, doing well overall although continues to have some slight soreness. No clinical evidence of recurrence.   Preop labs reviewed, WNL.   EKG 07/31/19: Sinus rhythm with 1st degree A-V block. Rate 75. Septal infarct , age undetermined. No significant change since last tracing)       Anesthesia Quick Evaluation

## 2019-08-03 NOTE — Progress Notes (Signed)
Anesthesia Chart Review:  Hx of partial glossectomy 01/15/18 for squamous cell carcinoma. Last seen by Dr. Lucia Gaskins 07/02/18, doing well overall although continues to have some slight soreness. No clinical evidence of recurrence.   Preop labs reviewed, WNL.   EKG 07/31/19: Sinus rhythm with 1st degree A-V block. Rate 75. Septal infarct , age undetermined. No significant change since last tracing   Wynonia Musty [facility] Short Stay Center/Anesthesiology Phone [phone] 08/03/2019 12:37 PM

## 2019-08-04 ENCOUNTER — Encounter (HOSPITAL_COMMUNITY)
Admission: RE | Disposition: A | Discharge status: Rehabilitation Facility | Payer: Self-pay | Source: Home / Self Care | Attending: Neurosurgery

## 2019-08-04 ENCOUNTER — Inpatient Hospital Stay (HOSPITAL_COMMUNITY): Payer: Medicare Other

## 2019-08-04 ENCOUNTER — Inpatient Hospital Stay (HOSPITAL_COMMUNITY): Payer: Medicare Other | Admitting: Anesthesiology

## 2019-08-04 ENCOUNTER — Inpatient Hospital Stay (HOSPITAL_COMMUNITY): Payer: Medicare Other | Admitting: Physician Assistant

## 2019-08-04 ENCOUNTER — Encounter (HOSPITAL_COMMUNITY): Payer: Self-pay | Admitting: Neurosurgery

## 2019-08-04 ENCOUNTER — Other Ambulatory Visit: Payer: Self-pay

## 2019-08-04 ENCOUNTER — Inpatient Hospital Stay (HOSPITAL_COMMUNITY)
Admission: RE | Admit: 2019-08-04 | Discharge: 2019-08-10 | DRG: 457 | Disposition: A | Discharge status: Rehabilitation Facility | Payer: Medicare Other | Attending: Neurosurgery | Admitting: Neurosurgery

## 2019-08-04 DIAGNOSIS — R269 Unspecified abnormalities of gait and mobility: Secondary | ICD-10-CM | POA: Diagnosis present

## 2019-08-04 DIAGNOSIS — M62838 Other muscle spasm: Secondary | ICD-10-CM | POA: Diagnosis not present

## 2019-08-04 DIAGNOSIS — Z90710 Acquired absence of both cervix and uterus: Secondary | ICD-10-CM | POA: Diagnosis not present

## 2019-08-04 DIAGNOSIS — M4127 Other idiopathic scoliosis, lumbosacral region: Secondary | ICD-10-CM | POA: Diagnosis not present

## 2019-08-04 DIAGNOSIS — Z8249 Family history of ischemic heart disease and other diseases of the circulatory system: Secondary | ICD-10-CM

## 2019-08-04 DIAGNOSIS — M4125 Other idiopathic scoliosis, thoracolumbar region: Secondary | ICD-10-CM | POA: Diagnosis present

## 2019-08-04 DIAGNOSIS — M4185 Other forms of scoliosis, thoracolumbar region: Secondary | ICD-10-CM | POA: Diagnosis not present

## 2019-08-04 DIAGNOSIS — E785 Hyperlipidemia, unspecified: Secondary | ICD-10-CM | POA: Diagnosis not present

## 2019-08-04 DIAGNOSIS — Z79890 Hormone replacement therapy (postmenopausal): Secondary | ICD-10-CM

## 2019-08-04 DIAGNOSIS — Z791 Long term (current) use of non-steroidal anti-inflammatories (NSAID): Secondary | ICD-10-CM | POA: Diagnosis not present

## 2019-08-04 DIAGNOSIS — R2689 Other abnormalities of gait and mobility: Secondary | ICD-10-CM | POA: Diagnosis present

## 2019-08-04 DIAGNOSIS — M5416 Radiculopathy, lumbar region: Secondary | ICD-10-CM | POA: Diagnosis present

## 2019-08-04 DIAGNOSIS — M438X7 Other specified deforming dorsopathies, lumbosacral region: Secondary | ICD-10-CM | POA: Diagnosis not present

## 2019-08-04 DIAGNOSIS — Z800 Family history of malignant neoplasm of digestive organs: Secondary | ICD-10-CM | POA: Diagnosis not present

## 2019-08-04 DIAGNOSIS — Z833 Family history of diabetes mellitus: Secondary | ICD-10-CM

## 2019-08-04 DIAGNOSIS — Z85810 Personal history of malignant neoplasm of tongue: Secondary | ICD-10-CM | POA: Diagnosis not present

## 2019-08-04 DIAGNOSIS — M2578 Osteophyte, vertebrae: Secondary | ICD-10-CM | POA: Diagnosis present

## 2019-08-04 DIAGNOSIS — Z79899 Other long term (current) drug therapy: Secondary | ICD-10-CM

## 2019-08-04 DIAGNOSIS — M79604 Pain in right leg: Secondary | ICD-10-CM | POA: Diagnosis not present

## 2019-08-04 DIAGNOSIS — G8929 Other chronic pain: Secondary | ICD-10-CM | POA: Diagnosis present

## 2019-08-04 DIAGNOSIS — M48062 Spinal stenosis, lumbar region with neurogenic claudication: Secondary | ICD-10-CM | POA: Diagnosis present

## 2019-08-04 DIAGNOSIS — G629 Polyneuropathy, unspecified: Secondary | ICD-10-CM | POA: Diagnosis present

## 2019-08-04 DIAGNOSIS — K219 Gastro-esophageal reflux disease without esophagitis: Secondary | ICD-10-CM | POA: Diagnosis present

## 2019-08-04 DIAGNOSIS — I10 Essential (primary) hypertension: Secondary | ICD-10-CM | POA: Diagnosis present

## 2019-08-04 DIAGNOSIS — K592 Neurogenic bowel, not elsewhere classified: Secondary | ICD-10-CM | POA: Diagnosis present

## 2019-08-04 DIAGNOSIS — Z885 Allergy status to narcotic agent status: Secondary | ICD-10-CM | POA: Diagnosis not present

## 2019-08-04 DIAGNOSIS — E039 Hypothyroidism, unspecified: Secondary | ICD-10-CM | POA: Diagnosis present

## 2019-08-04 DIAGNOSIS — B370 Candidal stomatitis: Secondary | ICD-10-CM | POA: Diagnosis present

## 2019-08-04 DIAGNOSIS — E871 Hypo-osmolality and hyponatremia: Secondary | ICD-10-CM | POA: Diagnosis not present

## 2019-08-04 DIAGNOSIS — D72828 Other elevated white blood cell count: Secondary | ICD-10-CM | POA: Diagnosis not present

## 2019-08-04 DIAGNOSIS — M4126 Other idiopathic scoliosis, lumbar region: Secondary | ICD-10-CM | POA: Diagnosis present

## 2019-08-04 DIAGNOSIS — Z419 Encounter for procedure for purposes other than remedying health state, unspecified: Secondary | ICD-10-CM

## 2019-08-04 DIAGNOSIS — K5900 Constipation, unspecified: Secondary | ICD-10-CM | POA: Diagnosis present

## 2019-08-04 DIAGNOSIS — F419 Anxiety disorder, unspecified: Secondary | ICD-10-CM | POA: Diagnosis present

## 2019-08-04 DIAGNOSIS — Z9889 Other specified postprocedural states: Secondary | ICD-10-CM | POA: Diagnosis not present

## 2019-08-04 DIAGNOSIS — H9190 Unspecified hearing loss, unspecified ear: Secondary | ICD-10-CM | POA: Diagnosis present

## 2019-08-04 DIAGNOSIS — M4316 Spondylolisthesis, lumbar region: Secondary | ICD-10-CM | POA: Diagnosis not present

## 2019-08-04 DIAGNOSIS — E876 Hypokalemia: Secondary | ICD-10-CM | POA: Diagnosis not present

## 2019-08-04 DIAGNOSIS — M4186 Other forms of scoliosis, lumbar region: Secondary | ICD-10-CM | POA: Diagnosis not present

## 2019-08-04 DIAGNOSIS — M1990 Unspecified osteoarthritis, unspecified site: Secondary | ICD-10-CM | POA: Diagnosis present

## 2019-08-04 DIAGNOSIS — G4700 Insomnia, unspecified: Secondary | ICD-10-CM | POA: Diagnosis not present

## 2019-08-04 DIAGNOSIS — M4326 Fusion of spine, lumbar region: Secondary | ICD-10-CM | POA: Diagnosis not present

## 2019-08-04 DIAGNOSIS — M5116 Intervertebral disc disorders with radiculopathy, lumbar region: Secondary | ICD-10-CM | POA: Diagnosis not present

## 2019-08-04 DIAGNOSIS — M419 Scoliosis, unspecified: Secondary | ICD-10-CM | POA: Diagnosis not present

## 2019-08-04 DIAGNOSIS — Z85828 Personal history of other malignant neoplasm of skin: Secondary | ICD-10-CM

## 2019-08-04 DIAGNOSIS — M48061 Spinal stenosis, lumbar region without neurogenic claudication: Secondary | ICD-10-CM | POA: Diagnosis not present

## 2019-08-04 DIAGNOSIS — M532X4 Spinal instabilities, thoracic region: Secondary | ICD-10-CM | POA: Diagnosis not present

## 2019-08-04 DIAGNOSIS — D62 Acute posthemorrhagic anemia: Secondary | ICD-10-CM | POA: Diagnosis present

## 2019-08-04 DIAGNOSIS — Z981 Arthrodesis status: Secondary | ICD-10-CM | POA: Diagnosis not present

## 2019-08-04 HISTORY — PX: ANTERIOR LATERAL LUMBAR FUSION 4 LEVELS: SHX5552

## 2019-08-04 HISTORY — PX: ANTERIOR LUMBAR FUSION: SHX1170

## 2019-08-04 HISTORY — PX: ABDOMINAL EXPOSURE: SHX5708

## 2019-08-04 SURGERY — ANTERIOR LUMBAR FUSION 1 LEVEL
Anesthesia: General | Site: Spine Lumbar | Laterality: Right

## 2019-08-04 MED ORDER — PROPOFOL 1000 MG/100ML IV EMUL
INTRAVENOUS | Status: AC
  Filled 2019-08-04: qty 100

## 2019-08-04 MED ORDER — METHOCARBAMOL 500 MG PO TABS
ORAL_TABLET | ORAL | Status: AC
  Filled 2019-08-04: qty 1

## 2019-08-04 MED ORDER — ACETAMINOPHEN 650 MG RE SUPP: 650.0000 mg | RECTAL | Status: DC | PRN

## 2019-08-04 MED ORDER — BUPIVACAINE HCL (PF) 0.5 % IJ SOLN
INTRAMUSCULAR | Status: AC
  Filled 2019-08-04: qty 30

## 2019-08-04 MED ORDER — ACETAMINOPHEN 500 MG PO TABS: 1000.0000 mg | ORAL_TABLET | Freq: Four times a day (QID) | ORAL | Status: DC | PRN

## 2019-08-04 MED ORDER — SUGAMMADEX SODIUM 200 MG/2ML IV SOLN
INTRAVENOUS | Status: DC | PRN
  Administered 2019-08-04: 140 mg via INTRAVENOUS

## 2019-08-04 MED ORDER — LYSINE 500 MG PO TABS: 500.0000 mg | ORAL_TABLET | Freq: Every day | ORAL | Status: DC

## 2019-08-04 MED ORDER — ONDANSETRON HCL 4 MG/2ML IJ SOLN
INTRAMUSCULAR | Status: DC | PRN
  Administered 2019-08-04: 4 mg via INTRAVENOUS

## 2019-08-04 MED ORDER — CHLORHEXIDINE GLUCONATE CLOTH 2 % EX PADS: 6.0000 | MEDICATED_PAD | Freq: Once | CUTANEOUS | Status: DC

## 2019-08-04 MED ORDER — HYDROMORPHONE HCL 1 MG/ML IJ SOLN
INTRAMUSCULAR | Status: AC
  Filled 2019-08-04: qty 1

## 2019-08-04 MED ORDER — ONDANSETRON HCL 4 MG PO TABS: 4.0000 mg | ORAL_TABLET | Freq: Four times a day (QID) | ORAL | Status: DC | PRN

## 2019-08-04 MED ORDER — CHLORHEXIDINE GLUCONATE 0.12 % MT SOLN
15.0000 mL | Freq: Once | OROMUCOSAL | Status: AC
  Administered 2019-08-04: 15 mL via OROMUCOSAL
  Filled 2019-08-04: qty 15

## 2019-08-04 MED ORDER — ADULT MULTIVITAMIN W/MINERALS CH
1.0000 | ORAL_TABLET | Freq: Every day | ORAL | Status: DC
  Administered 2019-08-05 – 2019-08-10 (×5): 1 via ORAL
  Filled 2019-08-04 (×5): qty 1

## 2019-08-04 MED ORDER — LIDOCAINE-EPINEPHRINE 1 %-1:100000 IJ SOLN
INTRAMUSCULAR | Status: DC | PRN
  Administered 2019-08-04: 10 mL

## 2019-08-04 MED ORDER — MENTHOL 3 MG MT LOZG: 1.0000 | LOZENGE | OROMUCOSAL | Status: DC | PRN

## 2019-08-04 MED ORDER — HYDROMORPHONE HCL 1 MG/ML IJ SOLN
0.2500 mg | INTRAMUSCULAR | Status: DC | PRN
  Administered 2019-08-04: 0.5 mg via INTRAVENOUS

## 2019-08-04 MED ORDER — FLEET ENEMA 7-19 GM/118ML RE ENEM: 1.0000 | ENEMA | Freq: Once | RECTAL | Status: DC | PRN

## 2019-08-04 MED ORDER — ROCURONIUM BROMIDE 10 MG/ML (PF) SYRINGE
PREFILLED_SYRINGE | INTRAVENOUS | Status: DC | PRN
  Administered 2019-08-04: 50 mg via INTRAVENOUS
  Administered 2019-08-04: 20 mg via INTRAVENOUS

## 2019-08-04 MED ORDER — PROPOFOL 10 MG/ML IV BOLUS
INTRAVENOUS | Status: AC
  Filled 2019-08-04: qty 20

## 2019-08-04 MED ORDER — IBUPROFEN 200 MG PO TABS: 800.0000 mg | ORAL_TABLET | Freq: Every evening | ORAL | Status: DC | PRN

## 2019-08-04 MED ORDER — LIDOCAINE 2% (20 MG/ML) 5 ML SYRINGE
INTRAMUSCULAR | Status: AC
  Filled 2019-08-04: qty 5

## 2019-08-04 MED ORDER — HYDROMORPHONE HCL 1 MG/ML IJ SOLN
0.2500 mg | INTRAMUSCULAR | Status: DC | PRN
  Administered 2019-08-04 (×4): 0.5 mg via INTRAVENOUS

## 2019-08-04 MED ORDER — METHOCARBAMOL 500 MG PO TABS
500.0000 mg | ORAL_TABLET | Freq: Four times a day (QID) | ORAL | Status: DC | PRN
  Administered 2019-08-04: 500 mg via ORAL

## 2019-08-04 MED ORDER — 0.9 % SODIUM CHLORIDE (POUR BTL) OPTIME
TOPICAL | Status: DC | PRN
  Administered 2019-08-04 (×2): 1000 mL

## 2019-08-04 MED ORDER — FENTANYL CITRATE (PF) 250 MCG/5ML IJ SOLN
INTRAMUSCULAR | Status: AC
  Filled 2019-08-04: qty 5

## 2019-08-04 MED ORDER — DEXAMETHASONE SODIUM PHOSPHATE 10 MG/ML IJ SOLN
INTRAMUSCULAR | Status: AC
  Filled 2019-08-04: qty 1

## 2019-08-04 MED ORDER — PANTOPRAZOLE SODIUM 40 MG IV SOLR: 40.0000 mg | Freq: Every day | INTRAVENOUS | Status: DC

## 2019-08-04 MED ORDER — LIDOCAINE 2% (20 MG/ML) 5 ML SYRINGE
INTRAMUSCULAR | Status: DC | PRN
  Administered 2019-08-04: 60 mg via INTRAVENOUS

## 2019-08-04 MED ORDER — HYDROCODONE-ACETAMINOPHEN 5-325 MG PO TABS
2.0000 | ORAL_TABLET | ORAL | Status: DC | PRN
  Administered 2019-08-04 – 2019-08-10 (×19): 2 via ORAL
  Filled 2019-08-04 (×21): qty 2

## 2019-08-04 MED ORDER — ROCURONIUM BROMIDE 10 MG/ML (PF) SYRINGE
PREFILLED_SYRINGE | INTRAVENOUS | Status: AC
  Filled 2019-08-04: qty 10

## 2019-08-04 MED ORDER — BUPIVACAINE HCL (PF) 0.5 % IJ SOLN
INTRAMUSCULAR | Status: DC | PRN
  Administered 2019-08-04: 10 mL

## 2019-08-04 MED ORDER — PROPOFOL 500 MG/50ML IV EMUL
INTRAVENOUS | Status: DC | PRN
Start: 2019-08-04 — End: 2019-08-04
  Administered 2019-08-04: 50 ug/kg/min via INTRAVENOUS

## 2019-08-04 MED ORDER — ACETAMINOPHEN 325 MG PO TABS
650.0000 mg | ORAL_TABLET | ORAL | Status: DC | PRN
  Administered 2019-08-05 (×2): 650 mg via ORAL
  Filled 2019-08-04 (×2): qty 2

## 2019-08-04 MED ORDER — SODIUM CHLORIDE 0.9% FLUSH
3.0000 mL | Freq: Two times a day (BID) | INTRAVENOUS | Status: DC
  Administered 2019-08-04 – 2019-08-10 (×11): 3 mL via INTRAVENOUS

## 2019-08-04 MED ORDER — ACETAMINOPHEN 500 MG PO TABS
1000.0000 mg | ORAL_TABLET | Freq: Once | ORAL | Status: AC
  Administered 2019-08-04: 1000 mg via ORAL
  Filled 2019-08-04: qty 2

## 2019-08-04 MED ORDER — TRIAMCINOLONE ACETONIDE 0.1 % EX CREA
1.0000 "application " | TOPICAL_CREAM | Freq: Two times a day (BID) | CUTANEOUS | Status: DC | PRN
  Filled 2019-08-04: qty 15

## 2019-08-04 MED ORDER — ALUM & MAG HYDROXIDE-SIMETH 200-200-20 MG/5ML PO SUSP: 30.0000 mL | Freq: Four times a day (QID) | ORAL | Status: DC | PRN

## 2019-08-04 MED ORDER — FENTANYL CITRATE (PF) 250 MCG/5ML IJ SOLN
INTRAMUSCULAR | Status: DC | PRN
  Administered 2019-08-04: 50 ug via INTRAVENOUS
  Administered 2019-08-04 (×3): 100 ug via INTRAVENOUS
  Administered 2019-08-04 (×2): 50 ug via INTRAVENOUS

## 2019-08-04 MED ORDER — METHOCARBAMOL 1000 MG/10ML IJ SOLN
500.0000 mg | Freq: Four times a day (QID) | INTRAVENOUS | Status: DC | PRN
  Filled 2019-08-04: qty 5

## 2019-08-04 MED ORDER — DOCUSATE SODIUM 100 MG PO CAPS
100.0000 mg | ORAL_CAPSULE | Freq: Two times a day (BID) | ORAL | Status: DC
  Administered 2019-08-04 – 2019-08-10 (×11): 100 mg via ORAL
  Filled 2019-08-04 (×11): qty 1

## 2019-08-04 MED ORDER — ASCORBIC ACID 500 MG PO TABS
500.0000 mg | ORAL_TABLET | Freq: Every day | ORAL | Status: DC
  Administered 2019-08-05 – 2019-08-10 (×5): 500 mg via ORAL
  Filled 2019-08-04 (×5): qty 1

## 2019-08-04 MED ORDER — SODIUM CHLORIDE 0.9% FLUSH: 3.0000 mL | INTRAVENOUS | Status: DC | PRN

## 2019-08-04 MED ORDER — SUCCINYLCHOLINE CHLORIDE 200 MG/10ML IV SOSY
PREFILLED_SYRINGE | INTRAVENOUS | Status: AC
  Filled 2019-08-04: qty 10

## 2019-08-04 MED ORDER — HAIR/SKIN/NAILS/BIOTIN PO TABS: ORAL_TABLET | Freq: Every day | ORAL | Status: DC

## 2019-08-04 MED ORDER — CEFAZOLIN SODIUM-DEXTROSE 2-4 GM/100ML-% IV SOLN
2.0000 g | INTRAVENOUS | Status: AC
  Administered 2019-08-04 (×2): 2 g via INTRAVENOUS
  Filled 2019-08-04: qty 100

## 2019-08-04 MED ORDER — SUCCINYLCHOLINE CHLORIDE 20 MG/ML IJ SOLN
INTRAMUSCULAR | Status: DC | PRN
  Administered 2019-08-04: 100 mg via INTRAVENOUS

## 2019-08-04 MED ORDER — PHENYLEPHRINE 40 MCG/ML (10ML) SYRINGE FOR IV PUSH (FOR BLOOD PRESSURE SUPPORT)
PREFILLED_SYRINGE | INTRAVENOUS | Status: DC | PRN
  Administered 2019-08-04 (×2): 80 ug via INTRAVENOUS
  Administered 2019-08-04: 160 ug via INTRAVENOUS
  Administered 2019-08-04: 80 ug via INTRAVENOUS

## 2019-08-04 MED ORDER — BISACODYL 10 MG RE SUPP
10.0000 mg | Freq: Every day | RECTAL | Status: DC | PRN
  Administered 2019-08-10: 10 mg via RECTAL
  Filled 2019-08-04: qty 1

## 2019-08-04 MED ORDER — LEVOTHYROXINE SODIUM 75 MCG PO TABS
75.0000 ug | ORAL_TABLET | Freq: Every day | ORAL | Status: DC
  Administered 2019-08-05 – 2019-08-10 (×5): 75 ug via ORAL
  Filled 2019-08-04 (×5): qty 1

## 2019-08-04 MED ORDER — POLYETHYLENE GLYCOL 3350 17 G PO PACK: 17.0000 g | PACK | Freq: Every day | ORAL | Status: DC | PRN

## 2019-08-04 MED ORDER — B COMPLEX PO TABS: 1.0000 | ORAL_TABLET | Freq: Every day | ORAL | Status: DC

## 2019-08-04 MED ORDER — CHLORHEXIDINE GLUCONATE 4 % EX LIQD: 60.0000 mL | Freq: Once | CUTANEOUS | Status: DC

## 2019-08-04 MED ORDER — MECLIZINE HCL 25 MG PO TABS: 25.0000 mg | ORAL_TABLET | Freq: Three times a day (TID) | ORAL | Status: DC | PRN

## 2019-08-04 MED ORDER — MIDAZOLAM HCL 2 MG/2ML IJ SOLN: INTRAMUSCULAR | Status: DC | PRN

## 2019-08-04 MED ORDER — DEXAMETHASONE SODIUM PHOSPHATE 10 MG/ML IJ SOLN
INTRAMUSCULAR | Status: DC | PRN
  Administered 2019-08-04: 10 mg via INTRAVENOUS

## 2019-08-04 MED ORDER — DIAZEPAM 5 MG PO TABS
5.0000 mg | ORAL_TABLET | Freq: Four times a day (QID) | ORAL | Status: DC | PRN
  Administered 2019-08-04 – 2019-08-10 (×9): 5 mg via ORAL
  Filled 2019-08-04 (×9): qty 1

## 2019-08-04 MED ORDER — PHENYLEPHRINE 40 MCG/ML (10ML) SYRINGE FOR IV PUSH (FOR BLOOD PRESSURE SUPPORT)
PREFILLED_SYRINGE | INTRAVENOUS | Status: AC
  Filled 2019-08-04: qty 10

## 2019-08-04 MED ORDER — CEFAZOLIN SODIUM-DEXTROSE 2-4 GM/100ML-% IV SOLN
2.0000 g | Freq: Three times a day (TID) | INTRAVENOUS | Status: AC
  Administered 2019-08-04 – 2019-08-05 (×2): 2 g via INTRAVENOUS
  Filled 2019-08-04 (×2): qty 100

## 2019-08-04 MED ORDER — PHENOL 1.4 % MT LIQD: 1.0000 | OROMUCOSAL | Status: DC | PRN

## 2019-08-04 MED ORDER — OXYCODONE HCL 5 MG PO TABS
5.0000 mg | ORAL_TABLET | ORAL | Status: DC | PRN
  Administered 2019-08-05 – 2019-08-09 (×9): 5 mg via ORAL
  Filled 2019-08-04 (×9): qty 1

## 2019-08-04 MED ORDER — LACTATED RINGERS IV SOLN
INTRAVENOUS | Status: DC | PRN
Start: 2019-08-04 — End: 2019-08-04

## 2019-08-04 MED ORDER — PANTOPRAZOLE SODIUM 40 MG PO TBEC
40.0000 mg | DELAYED_RELEASE_TABLET | Freq: Every day | ORAL | Status: DC
  Administered 2019-08-05 – 2019-08-10 (×5): 40 mg via ORAL
  Filled 2019-08-04 (×5): qty 1

## 2019-08-04 MED ORDER — THROMBIN 5000 UNITS EX SOLR
CUTANEOUS | Status: AC
  Filled 2019-08-04: qty 5000

## 2019-08-04 MED ORDER — ZOLPIDEM TARTRATE 5 MG PO TABS: 5.0000 mg | ORAL_TABLET | Freq: Every evening | ORAL | Status: DC | PRN

## 2019-08-04 MED ORDER — ACETAMINOPHEN 10 MG/ML IV SOLN
INTRAVENOUS | Status: AC
  Filled 2019-08-04: qty 100

## 2019-08-04 MED ORDER — TRIAMTERENE-HCTZ 75-50 MG PO TABS
1.0000 | ORAL_TABLET | Freq: Every day | ORAL | Status: DC
  Administered 2019-08-05 – 2019-08-10 (×4): 1 via ORAL
  Filled 2019-08-04 (×6): qty 1

## 2019-08-04 MED ORDER — ORAL CARE MOUTH RINSE: 15.0000 mL | Freq: Once | OROMUCOSAL | Status: AC

## 2019-08-04 MED ORDER — TRAMADOL HCL 50 MG PO TABS: 50.0000 mg | ORAL_TABLET | Freq: Four times a day (QID) | ORAL | Status: DC | PRN

## 2019-08-04 MED ORDER — LACTATED RINGERS IV SOLN: INTRAVENOUS | Status: DC | PRN

## 2019-08-04 MED ORDER — CYCLOBENZAPRINE HCL 10 MG PO TABS: 5.0000 mg | ORAL_TABLET | Freq: Three times a day (TID) | ORAL | Status: DC | PRN

## 2019-08-04 MED ORDER — SODIUM CHLORIDE 0.9 % IV SOLN
250.0000 mL | INTRAVENOUS | Status: DC
  Administered 2019-08-04: 250 mL via INTRAVENOUS

## 2019-08-04 MED ORDER — VITAMIN D 25 MCG (1000 UNIT) PO TABS
1000.0000 [IU] | ORAL_TABLET | Freq: Every day | ORAL | Status: DC
  Administered 2019-08-05 – 2019-08-10 (×5): 1000 [IU] via ORAL
  Filled 2019-08-04 (×5): qty 1

## 2019-08-04 MED ORDER — HYDROMORPHONE HCL 1 MG/ML IJ SOLN
0.5000 mg | INTRAMUSCULAR | Status: DC | PRN
  Administered 2019-08-04 – 2019-08-09 (×10): 0.5 mg via INTRAVENOUS
  Filled 2019-08-04 (×9): qty 1

## 2019-08-04 MED ORDER — PROPOFOL 10 MG/ML IV BOLUS
INTRAVENOUS | Status: DC | PRN
  Administered 2019-08-04: 20 mg via INTRAVENOUS
  Administered 2019-08-04: 100 mg via INTRAVENOUS

## 2019-08-04 MED ORDER — KCL IN DEXTROSE-NACL 20-5-0.45 MEQ/L-%-% IV SOLN
INTRAVENOUS | Status: DC
  Filled 2019-08-04 (×4): qty 1000

## 2019-08-04 MED ORDER — PHENYLEPHRINE HCL-NACL 10-0.9 MG/250ML-% IV SOLN
INTRAVENOUS | Status: DC | PRN
  Administered 2019-08-04: 25 ug/min via INTRAVENOUS

## 2019-08-04 MED ORDER — DIPHENHYDRAMINE HCL 25 MG PO CAPS
25.0000 mg | ORAL_CAPSULE | Freq: Every evening | ORAL | Status: DC | PRN
  Administered 2019-08-06: 25 mg via ORAL
  Filled 2019-08-04: qty 1

## 2019-08-04 MED ORDER — ALBUMIN HUMAN 5 % IV SOLN
INTRAVENOUS | Status: DC | PRN
Start: 2019-08-04 — End: 2019-08-04

## 2019-08-04 MED ORDER — GABAPENTIN 300 MG PO CAPS
300.0000 mg | ORAL_CAPSULE | Freq: Three times a day (TID) | ORAL | Status: DC
  Administered 2019-08-04 – 2019-08-09 (×15): 300 mg via ORAL
  Filled 2019-08-04 (×15): qty 1

## 2019-08-04 MED ORDER — ACETAMINOPHEN 10 MG/ML IV SOLN
INTRAVENOUS | Status: DC | PRN
  Administered 2019-08-04: 1000 mg via INTRAVENOUS

## 2019-08-04 MED ORDER — ONDANSETRON HCL 4 MG/2ML IJ SOLN
INTRAMUSCULAR | Status: AC
  Filled 2019-08-04: qty 2

## 2019-08-04 MED ORDER — LIDOCAINE-EPINEPHRINE 1 %-1:100000 IJ SOLN
INTRAMUSCULAR | Status: AC
  Filled 2019-08-04: qty 1

## 2019-08-04 MED ORDER — THROMBIN 5000 UNITS EX SOLR: OROMUCOSAL | Status: DC | PRN

## 2019-08-04 MED ORDER — EST ESTROGENS-METHYLTEST 1.25-2.5 MG PO TABS: 1.0000 | ORAL_TABLET | Freq: Every day | ORAL | Status: DC

## 2019-08-04 MED ORDER — DEXAMETHASONE SODIUM PHOSPHATE 10 MG/ML IJ SOLN
10.0000 mg | Freq: Once | INTRAMUSCULAR | Status: AC
  Administered 2019-08-04: 10 mg via INTRAVENOUS
  Filled 2019-08-04: qty 1

## 2019-08-04 MED ORDER — ONDANSETRON HCL 4 MG/2ML IJ SOLN
4.0000 mg | Freq: Four times a day (QID) | INTRAMUSCULAR | Status: DC | PRN
  Administered 2019-08-04: 4 mg via INTRAVENOUS
  Filled 2019-08-04: qty 2

## 2019-08-04 SURGICAL SUPPLY — 78 items
APPLIER CLIP 11 MED OPEN (CLIP) ×5
BASE TI HL 6X38X28 20D (Bolt) ×1 IMPLANT
BASE TI HL 6X38X28MM 20 DEG (Bolt) ×1 IMPLANT
BOLT BASE TI 5X17.5 VARIABLE (Bolt) ×2 IMPLANT
BOLT BASE TI 5X20 VARIABLE (Bolt) ×2 IMPLANT
CAGE MODULUS XL 8X18X45 - 10 (Cage) ×2 IMPLANT
CAGE MODULUS XL 8X18X50 - 10 (Cage) ×2 IMPLANT
CAGE MODULUS XL 8X18X55 - 10 (Cage) ×4 IMPLANT
CANISTER SUCT 3000ML PPV (MISCELLANEOUS) ×7 IMPLANT
CLIP APPLIE 11 MED OPEN (CLIP) ×3 IMPLANT
COVER BACK TABLE 60X90IN (DRAPES) ×5 IMPLANT
DERMABOND ADVANCED (GAUZE/BANDAGES/DRESSINGS) ×6
DERMABOND ADVANCED .7 DNX12 (GAUZE/BANDAGES/DRESSINGS) ×3 IMPLANT
DRAPE C-ARM 42X72 X-RAY (DRAPES) ×9 IMPLANT
DRAPE C-ARMOR (DRAPES) ×7 IMPLANT
DRAPE LAPAROTOMY 100X72X124 (DRAPES) ×7 IMPLANT
DRSG OPSITE POSTOP 3X4 (GAUZE/BANDAGES/DRESSINGS) ×2 IMPLANT
DRSG OPSITE POSTOP 4X6 (GAUZE/BANDAGES/DRESSINGS) ×4 IMPLANT
DURAPREP 26ML APPLICATOR (WOUND CARE) ×7 IMPLANT
ELECT BLADE 4.0 EZ CLEAN MEGAD (MISCELLANEOUS) ×5
ELECT REM PT RETURN 9FT ADLT (ELECTROSURGICAL) ×5
ELECTRODE BLDE 4.0 EZ CLN MEGD (MISCELLANEOUS) IMPLANT
ELECTRODE REM PT RTRN 9FT ADLT (ELECTROSURGICAL) ×3 IMPLANT
GLOVE BIO SURGEON STRL SZ 6.5 (GLOVE) ×3 IMPLANT
GLOVE BIO SURGEON STRL SZ7.5 (GLOVE) ×6 IMPLANT
GLOVE BIO SURGEON STRL SZ8 (GLOVE) ×13 IMPLANT
GLOVE BIO SURGEONS STRL SZ 6.5 (GLOVE) ×3
GLOVE BIOGEL PI IND STRL 6.5 (GLOVE) IMPLANT
GLOVE BIOGEL PI IND STRL 7.0 (GLOVE) IMPLANT
GLOVE BIOGEL PI IND STRL 7.5 (GLOVE) IMPLANT
GLOVE BIOGEL PI IND STRL 8 (GLOVE) ×6 IMPLANT
GLOVE BIOGEL PI IND STRL 8.5 (GLOVE) ×3 IMPLANT
GLOVE BIOGEL PI INDICATOR 6.5 (GLOVE) ×6
GLOVE BIOGEL PI INDICATOR 7.0 (GLOVE) ×6
GLOVE BIOGEL PI INDICATOR 7.5 (GLOVE) ×8
GLOVE BIOGEL PI INDICATOR 8 (GLOVE) ×20
GLOVE BIOGEL PI INDICATOR 8.5 (GLOVE) ×8
GLOVE ECLIPSE 6.5 STRL STRAW (GLOVE) ×2 IMPLANT
GLOVE ECLIPSE 7.5 STRL STRAW (GLOVE) ×8 IMPLANT
GLOVE ECLIPSE 8.0 STRL XLNG CF (GLOVE) ×14 IMPLANT
GLOVE SS BIOGEL STRL SZ 7.5 (GLOVE) IMPLANT
GLOVE SUPERSENSE BIOGEL SZ 7.5 (GLOVE) ×4
GOWN STRL REUS W/ TWL LRG LVL3 (GOWN DISPOSABLE) IMPLANT
GOWN STRL REUS W/ TWL XL LVL3 (GOWN DISPOSABLE) ×3 IMPLANT
GOWN STRL REUS W/TWL 2XL LVL3 (GOWN DISPOSABLE) ×16 IMPLANT
GOWN STRL REUS W/TWL LRG LVL3 (GOWN DISPOSABLE) ×15
GOWN STRL REUS W/TWL XL LVL3 (GOWN DISPOSABLE) ×30
HEMOSTAT POWDER KIT SURGIFOAM (HEMOSTASIS) ×2 IMPLANT
KIT BASIN OR (CUSTOM PROCEDURE TRAY) ×5 IMPLANT
KIT DILATOR XLIF 5 (KITS) IMPLANT
KIT INFUSE SMALL (Orthopedic Implant) ×2 IMPLANT
KIT INFUSE XX SMALL 0.7CC (Orthopedic Implant) ×2 IMPLANT
KIT SURGICAL ACCESS MAXCESS 4 (KITS) ×2 IMPLANT
KIT TURNOVER KIT B (KITS) ×5 IMPLANT
KIT XLIF (KITS) ×2
MARKER PEN SURG W/LABELS BLK (STERILIZATION PRODUCTS) ×4 IMPLANT
MODULE NVM5 NEXT GEN EMG (NEEDLE) ×2 IMPLANT
NDL HYPO 25X1 1.5 SAFETY (NEEDLE) IMPLANT
NDL SPNL 18GX3.5 QUINCKE PK (NEEDLE) ×3 IMPLANT
NEEDLE HYPO 25X1 1.5 SAFETY (NEEDLE) ×5 IMPLANT
NEEDLE SPNL 18GX3.5 QUINCKE PK (NEEDLE) ×5 IMPLANT
NS IRRIG 1000ML POUR BTL (IV SOLUTION) ×7 IMPLANT
PACK LAMINECTOMY NEURO (CUSTOM PROCEDURE TRAY) ×7 IMPLANT
PUTTY BONE ATTRAX 10CC STRIP (Putty) ×4 IMPLANT
SPONGE LAP 18X18 RF (DISPOSABLE) ×5 IMPLANT
STAPLER VISISTAT 35W (STAPLE) ×2 IMPLANT
SUT PDS AB 1 CTX 36 (SUTURE) ×2 IMPLANT
SUT SILK 2 0 TIES 17X18 (SUTURE) ×5
SUT SILK 2-0 18XBRD TIE BLK (SUTURE) ×3 IMPLANT
SUT VIC AB 1 CT1 18XBRD ANBCTR (SUTURE) IMPLANT
SUT VIC AB 1 CT1 8-18 (SUTURE) ×5
SUT VIC AB 2-0 CP2 18 (SUTURE) ×4 IMPLANT
SUT VIC AB 2-0 CT1 18 (SUTURE) ×7 IMPLANT
SUT VIC AB 3-0 SH 8-18 (SUTURE) ×11 IMPLANT
TOWEL GREEN STERILE (TOWEL DISPOSABLE) ×7 IMPLANT
TOWEL GREEN STERILE FF (TOWEL DISPOSABLE) ×7 IMPLANT
TRAY FOLEY MTR SLVR 16FR STAT (SET/KITS/TRAYS/PACK) ×5 IMPLANT
WATER STERILE IRR 1000ML POUR (IV SOLUTION) ×5 IMPLANT

## 2019-08-04 NOTE — Anesthesia Procedure Notes (Signed)
Arterial Line Insertion Start/End6/09/2019 7:48 AM, 08/04/2019 7:53 AM Performed by: Griffin Dakin, CRNA, CRNA  Patient location: OR. Preanesthetic checklist: patient identified and pre-op evaluation Patient sedated Left, radial was placed Catheter size: 20 G Hand hygiene performed  and maximum sterile barriers used  Allen's test indicative of satisfactory collateral circulation Attempts: 2 Procedure performed without using ultrasound guided technique. Following insertion, dressing applied and Biopatch. Post procedure assessment: normal  Patient tolerated the procedure well with no immediate complications.

## 2019-08-04 NOTE — Op Note (Signed)
08/04/2019  1:26 PM  PATIENT:  Stacy Wagner  79 y.o. female  PRE-OPERATIVE DIAGNOSIS:  Sagittal plane imbalance, thoraco-lumbar scoliosis, lumbar foraminal stenosis, lumbar radiculopathy, lumbago  POST-OPERATIVE DIAGNOSIS:  Sagittal plane imbalance, thoraco-lumbar scoliosis, lumbar foraminal stenosis, lumbar radiculopathy, lumbago  PROCEDURE:  Procedure(s) with comments: Lumbar Five - Sacral One  Anterior Lumbar Interbody Fusion (N/A) - Dr Deitra Mayo to assist Lumbar One-Two, Lumbar Two-Three, Lumbar Three-Four, Lumbar Four-Five  Anterolateral Lumbar Interbody Fusion (Right) ABDOMINAL EXPOSURE (N/A) Stage I scoliosis surgery  SURGEON:  Surgeon(s) and Role: Panel 1:    Erline Levine, MD - Primary Panel 2:    * Angelia Mould, MD - Primary  PHYSICIAN ASSISTANT: Christella Noa, MD  ASSISTANTS: P[place], RN   Glenford Peers, NP   Jimmye Norman, MS-3   ANESTHESIA:   general  EBL:  60 mL   BLOOD ADMINISTERED:none  DRAINS: none   LOCAL MEDICATIONS USED:  MARCAINE    and LIDOCAINE   SPECIMEN:  No Specimen  DISPOSITION OF SPECIMEN:  N/A  COUNTS:  YES  TOURNIQUET:  * No tourniquets in log *  DICTATION: DICTATION:   INDICATIONS:  Pateint is 79 year old female with chronic and intractable back and right lower extremity pain with sagittal plane imbalance, 30 degree coronal curve and severe multi-level foraminal stenosis.  It was elected to take her to surgery for anterior lumbar decompression and fusion at the L 5 S1 level and then to perform right sided XLIF at L 12, L 23, L 34, L 45 levels from the rioght.  PROCEDURE:  Doctor Scot Dock performed exposure and his portion of the procedure will be dictated separately.  Upon exposing the L 5 S1 level, a localizing X ray was obtained with the C arm.  I then incised the anterior annulus and performed a thorough discectomy with wide ligamentous releases.  The endplates were cleared of disc and cartilagenous material and a thorough  discectomy was performed with decompression of the ventral annulus and disc material.  After trials, a 20 degree, 6 x 38 x 28 Base Hyperlordotic titanium ALIF cage was placed and lagged with 2, 5.5  mm screws, one  in L 5 (20 mm) and one in S 1 ( 17.5 mm). This spacer was  packed with extra extra small BMP and Attrax.  The implant was tamped into position and positioning was confirmed with C arm.   Locking mechanisms were engaged, soft tissues were inspected and found to be in good repair.   Fascia was closed with 1 PDS running stitch, skin edges closed with 2-0 and 3-0 vicryl sutures.  Wound was dressed with a sterile occlusive dressing.    Patient was then  placed in a left lateral decubitus position on the operative table and using orthogonally projected C-arm fluoroscopy the patient was placed so that the L 45, L 34, L 23, L 12  levels were visualized in AP and lateral plane. The patient was then taped into position.  Skin was marked along with a posterior finger dissection incision. Her flank was then prepped and draped in usual sterile fashion and incisions were made sequentially at L 45 level. Posterior finger dissection was made to enter the retroperitoneal space and then subsequently the probe was inserted into the psoas muscle from the right side initially at the L 45 level. After mapping the neural elements were able to dock the probe per the midpoint of this vertebral level and without indications electrically of too close proximity to the  neural tissues. Subsequently the self-retaining tractor was.after sequential dilators were utilized the shim was employed and the interspace was cleared of psoas muscle and then incised. A thorough discectomy was performed. Instruments were used to clear the interspace of disc material. After thorough discectomy was performed and this was performed using AP and lateral fluoroscopy an 8 lordotic by 55 x 18 mm implant was packed with small BMP and Attrax. This was  tamped into position and its position was confirmed on AP and lateral fluoroscopy.  We then approached the L 34 level through the same incision.   Posterior finger dissection was made to enter the retroperitoneal space and then subsequently the probe was inserted into the psoas muscle from the right side initially at the L 34  level. After mapping the neural elements were able to dock the probe per the midpoint of this vertebral level and without indications electrically of too close proximity to the neural tissues. Subsequently the self-retaining tractor was.after sequential dilators were utilized the shim was employed and the interspace was cleared of psoas muscle and then incised. A large bridging osteophyte was removed, which allowed access to the interspace.  A thorough discectomy was performed. Instruments were used to clear the interspace of disc material. After thorough discectomy was performed and this was performed using AP and lateral fluoroscopy an 8 lordotic by 55 x 18 mm implant was packed with  BMP and Attrax. This was tamped into position and its position was confirmed on AP and lateral fluoroscopy. We then entered the L 23 level, working between the 11 th and 12 th ribs. Posterior finger dissection was made to enter the retroperitoneal space and then subsequently the probe was inserted into the psoas muscle from the right side initially at the L 23 level. After mapping the neural elements were able to dock the probe per the midpoint of this vertebral level and without indications electrically of too close proximity to the neural tissues. Subsequently the self-retaining tractor was.after sequential dilators were utilized the shim was employed and the interspace was cleared of psoas muscle and then incised. A thorough discectomy was performed. Instruments were used to clear the interspace of disc material. After thorough discectomy was performed and this was performed using AP and lateral fluoroscopy  an 8 lordotic by 50 x 18 mm implant was packed with remaining BMP and Attrax. This was tamped into position using the slides and its position was confirmed on AP and lateral fluoroscopy.  We then approached the L 12 level through the same incision.   Posterior finger dissection was made to enter the retroperitoneal space and then subsequently the probe was inserted into the psoas muscle from the right side initially at the L 12  level. After mapping the neural elements were able to dock the probe per the midpoint of this vertebral level and without indications electrically of too close proximity to the neural tissues. Subsequently the self-retaining tractor was.after sequential dilators were utilized the shim was employed and the interspace was cleared of psoas muscle and then incised.   A thorough discectomy was performed. Angled instruments were used to clear the interspace of disc material. After thorough discectomy was performed and this was performed using AP and lateral fluoroscopy an 8 lordotic by 45 x 18 mm implant was packed with  BMP and Attrax.  This was tamped into position and its position was confirmed on AP and lateral fluoroscopy.  All implants were titanium. Hemostasis was assured the wounds were  irrigated interrupted Vicryl sutures.  Sterile occlusive dressing was placed with Dermabond. The patient was then extubated in the operating room and taken to recovery in stable and satisfactory condition having tolerated his operation well. Counts were correct at the end of the case.  Patient was extubated in the OR and taken to recovery having tolerated his surgery well.  Counts were correct.    Retractor times:  L 45 (18 minutes)  L 34 (18 minutes)  L 23 (13 minutes)  L 12 (22 minutes)   PLAN OF CARE: Admit to inpatient   PATIENT DISPOSITION:  PACU - hemodynamically stable.   Delay start of Pharmacological VTE agent (>24hrs) due to surgical blood loss or risk of bleeding: yes

## 2019-08-04 NOTE — Progress Notes (Signed)
Pt arrived on floor, transferred from PACU.  Patient is A&OX4 and in no acute distress. Pt states pain is 10/10 in R hip area and cannot get comfortable, ice pack placed no complaints of nausea. Honeycomb dressing (x2) to R flank area and to LL abdominal area. Foley catheter intact and secured with stat lock, draining clear yellow urine.   Patient oriented to room and unit. Bedside table and personal belongings within reach of patient. Patient educated on usage of nurse call bell, placed within reach of patient. Patient instructed to call for assistance. Patient in bed. Will continue to monitor.

## 2019-08-04 NOTE — Progress Notes (Signed)
Orthopedic Tech Progress Note Patient Details:  Stacy Wagner 07/04/1940 [id number] MD said patient has brace but daughter said mother was not fitted so i Fitted patient for TLSO brace..patient is in a lot of pain right now. Left brace at bedside with family( husband and daughter). Patient ID: Stacy Wagner, female   DOB: Jan 27, 1941, 79 y.o.   MRN: [id number]   Janit Pagan 08/04/2019, 5:50 PM

## 2019-08-04 NOTE — Progress Notes (Signed)
Sleepy, but arousable.  MAEW with good power.  Doing well.

## 2019-08-04 NOTE — Transfer of Care (Signed)
Immediate Anesthesia Transfer of Care Note  Patient: Stacy Wagner  Procedure(s) Performed: Lumbar Five - Sacral One  Anterior Lumbar Interbody Fusion (N/A Spine Lumbar) Lumbar One-Two, Lumbar Two-Three, Lumbar Three-Four, Lumbar Four-Five  Anterolateral Lumbar Interbody Fusion (Right Spine Lumbar) ABDOMINAL EXPOSURE (N/A )  Patient Location: PACU  Anesthesia Type:General  Level of Consciousness: drowsy  Airway & Oxygen Therapy: Patient Spontanous Breathing and Patient connected to face mask oxygen  Post-op Assessment: Report given to RN  Post vital signs: Reviewed and stable  Last Vitals:  Vitals Value Taken Time  BP 119/62 08/04/19 1337  Temp    Pulse 77 08/04/19 1341  Resp 21 08/04/19 1341  SpO2 100 % 08/04/19 1341  Vitals shown include unvalidated device data.  Last Pain:  Vitals:   08/04/19 0619  PainSc: 0-No pain         Complications: No apparent anesthesia complications

## 2019-08-04 NOTE — Op Note (Signed)
    NAME: Stacy Wagner    MRN: [id number] DOB: [birth year]    DATE OF OPERATION: 08/04/2019  PREOP DIAGNOSIS:    Degenerative disc disease and scoliosis  POSTOP DIAGNOSIS:    Same  PROCEDURE:    Anterior retroperitoneal exposure L5-S1  EXPOSURE SURGEON: Judeth Cornfield. Scot Dock, MD    SPINE SURGEON: London Pepper, MD  ASSIST: Mamie Nick, RN  ANESTHESIA: General  EBL: Minimal  INDICATIONS:    [name] is a 79 y.o. female who presents for a multilevel neurosurgical procedure for scoliosis.  We were asked to provide anterior retroperitoneal exposure of L5-S1  FINDINGS:   No significant disease in the common and external iliac artery on the left.  TECHNIQUE:   The patient was taken to the operating room and received a general anesthetic.  Monitoring lines were placed by anesthesia.  The level of the L5-S1 disc space was marked under fluoroscopy.  The abdomen was prepped and draped in the usual sterile fashion.  A transverse incision was made at the marked level and the dissection carried down to the anterior rectus sheath.  The anterior rectus sheath was divided transversely.  This was mobilized superiorly and inferiorly.  At the linea alba there was significant scar tissue because of her previous abdominal surgery.  I therefore will went lateral to the left rectus abdominis muscle and mobilized this medially.  At the lateral aspect the retroperitoneal space was entered and the dissection carried down to the psoas and then onto the external iliac artery.  Using blunt dissection the hypogastric and common iliac arteries were dissected free down to the left common iliac vein.  The dissection was carried down to the sacral promontory.  The middle sacral vessels were divided with electrocautery.  This allowed exposure of the L5-S1 disc space.  The retractor system was then placed to allow adequate exposure of L5-S1.  The needle was placed in the disc space and an x-ray was  confirmed used to confirm the correct level.  The remainder of the dictation is as per Dr. Vertell Limber.  Stacy Mayo, MD, FACS [facility]  DATE OF DICTATION:   08/04/2019

## 2019-08-04 NOTE — Interval H&P Note (Signed)
History and Physical Interval Note:  08/04/2019 7:20 AM  Stacy Wagner  has presented today for surgery, with the diagnosis of Sagittal plane imbalance.  The various methods of treatment have been discussed with the patient and family. After consideration of risks, benefits and other options for treatment, the patient has consented to  Procedure(s) with comments: Lumbar 5 Sacral 1 Anterior lumbar interbody fusion (N/A) - Dr Deitra Mayo to assist Lumbar 1-2 Lumbar 2-3 Lumbar 3-4 Lumbar 4-5 Anterolateral lumbar interbody fusion (N/A) ABDOMINAL EXPOSURE (N/A) as a surgical intervention.  The patient's history has been reviewed, patient examined, no change in status, stable for surgery.  I have reviewed the patient's chart and labs.  Questions were answered to the patient's satisfaction.     Stacy Wagner

## 2019-08-04 NOTE — Brief Op Note (Signed)
08/04/2019  1:26 PM  PATIENT:  Stacy Wagner  79 y.o. female  PRE-OPERATIVE DIAGNOSIS:  Sagittal plane imbalance, thoraco-lumbar scoliosis, lumbar foraminal stenosis, lumbar radiculopathy, lumbago  POST-OPERATIVE DIAGNOSIS:  Sagittal plane imbalance, thoraco-lumbar scoliosis, lumbar foraminal stenosis, lumbar radiculopathy, lumbago  PROCEDURE:  Procedure(s) with comments: Lumbar Five - Sacral One  Anterior Lumbar Interbody Fusion (N/A) - Dr Deitra Mayo to assist Lumbar One-Two, Lumbar Two-Three, Lumbar Three-Four, Lumbar Four-Five  Anterolateral Lumbar Interbody Fusion (Right) ABDOMINAL EXPOSURE (N/A) Stage I scoliosis surgery  SURGEON:  Surgeon(s) and Role: Panel 1:    Erline Levine, MD - Primary Panel 2:    * Angelia Mould, MD - Primary  PHYSICIAN ASSISTANT: Christella Noa, MD  ASSISTANTS: Poteat, RN   Glenford Peers, NP   Jimmye Norman, MS-3   ANESTHESIA:   general  EBL:  60 mL   BLOOD ADMINISTERED:none  DRAINS: none   LOCAL MEDICATIONS USED:  MARCAINE    and LIDOCAINE   SPECIMEN:  No Specimen  DISPOSITION OF SPECIMEN:  N/A  COUNTS:  YES  TOURNIQUET:  * No tourniquets in log *  DICTATION: DICTATION:   INDICATIONS:  Pateint is 79 year old female with chronic and intractable back and right lower extremity pain with sagittal plane imbalance, 30 degree coronal curve and severe multi-level foraminal stenosis.  It was elected to take her to surgery for anterior lumbar decompression and fusion at the L 5 S1 level and then to perform right sided XLIF at L 12, L 23, L 34, L 45 levels from the rioght.  PROCEDURE:  Doctor Scot Dock performed exposure and his portion of the procedure will be dictated separately.  Upon exposing the L 5 S1 level, a localizing X ray was obtained with the C arm.  I then incised the anterior annulus and performed a thorough discectomy with wide ligamentous releases.  The endplates were cleared of disc and cartilagenous material and a thorough  discectomy was performed with decompression of the ventral annulus and disc material.  After trials, a 20 degree, 6 x 38 x 28 Base Hyperlordotic titanium ALIF cage was placed and lagged with 2, 5.5  mm screws, one  in L 5 (20 mm) and one in S 1 ( 17.5 mm). This spacer was  packed with extra extra small BMP and Attrax.  The implant was tamped into position and positioning was confirmed with C arm.   Locking mechanisms were engaged, soft tissues were inspected and found to be in good repair.   Fascia was closed with 1 PDS running stitch, skin edges closed with 2-0 and 3-0 vicryl sutures.  Wound was dressed with a sterile occlusive dressing.    Patient was then  placed in a left lateral decubitus position on the operative table and using orthogonally projected C-arm fluoroscopy the patient was placed so that the L 45, L 34, L 23, L 12  levels were visualized in AP and lateral plane. The patient was then taped into position.  Skin was marked along with a posterior finger dissection incision. Her flank was then prepped and draped in usual sterile fashion and incisions were made sequentially at L 45 level. Posterior finger dissection was made to enter the retroperitoneal space and then subsequently the probe was inserted into the psoas muscle from the right side initially at the L 45 level. After mapping the neural elements were able to dock the probe per the midpoint of this vertebral level and without indications electrically of too close proximity to the  neural tissues. Subsequently the self-retaining tractor was.after sequential dilators were utilized the shim was employed and the interspace was cleared of psoas muscle and then incised. A thorough discectomy was performed. Instruments were used to clear the interspace of disc material. After thorough discectomy was performed and this was performed using AP and lateral fluoroscopy an 8 lordotic by 55 x 18 mm implant was packed with small BMP and Attrax. This was  tamped into position and its position was confirmed on AP and lateral fluoroscopy.  We then approached the L 34 level through the same incision.   Posterior finger dissection was made to enter the retroperitoneal space and then subsequently the probe was inserted into the psoas muscle from the right side initially at the L 34  level. After mapping the neural elements were able to dock the probe per the midpoint of this vertebral level and without indications electrically of too close proximity to the neural tissues. Subsequently the self-retaining tractor was.after sequential dilators were utilized the shim was employed and the interspace was cleared of psoas muscle and then incised. A large bridging osteophyte was removed, which allowed access to the interspace.  A thorough discectomy was performed. Instruments were used to clear the interspace of disc material. After thorough discectomy was performed and this was performed using AP and lateral fluoroscopy an 8 lordotic by 55 x 18 mm implant was packed with  BMP and Attrax. This was tamped into position and its position was confirmed on AP and lateral fluoroscopy. We then entered the L 23 level, working between the 11 th and 12 th ribs. Posterior finger dissection was made to enter the retroperitoneal space and then subsequently the probe was inserted into the psoas muscle from the right side initially at the L 23 level. After mapping the neural elements were able to dock the probe per the midpoint of this vertebral level and without indications electrically of too close proximity to the neural tissues. Subsequently the self-retaining tractor was.after sequential dilators were utilized the shim was employed and the interspace was cleared of psoas muscle and then incised. A thorough discectomy was performed. Instruments were used to clear the interspace of disc material. After thorough discectomy was performed and this was performed using AP and lateral fluoroscopy  an 8 lordotic by 50 x 18 mm implant was packed with remaining BMP and Attrax. This was tamped into position using the slides and its position was confirmed on AP and lateral fluoroscopy.  We then approached the L 12 level through the same incision.   Posterior finger dissection was made to enter the retroperitoneal space and then subsequently the probe was inserted into the psoas muscle from the right side initially at the L 12  level. After mapping the neural elements were able to dock the probe per the midpoint of this vertebral level and without indications electrically of too close proximity to the neural tissues. Subsequently the self-retaining tractor was.after sequential dilators were utilized the shim was employed and the interspace was cleared of psoas muscle and then incised.   A thorough discectomy was performed. Angled instruments were used to clear the interspace of disc material. After thorough discectomy was performed and this was performed using AP and lateral fluoroscopy an 8 lordotic by 45 x 18 mm implant was packed with  BMP and Attrax.  This was tamped into position and its position was confirmed on AP and lateral fluoroscopy.  All implants were titanium. Hemostasis was assured the wounds were  irrigated interrupted Vicryl sutures.  Sterile occlusive dressing was placed with Dermabond. The patient was then extubated in the operating room and taken to recovery in stable and satisfactory condition having tolerated his operation well. Counts were correct at the end of the case.  Patient was extubated in the OR and taken to recovery having tolerated his surgery well.  Counts were correct.    Retractor times:  L 45 (18 minutes)  L 34 (18 minutes)  L 23 (13 minutes)  L 12 (22 minutes)   PLAN OF CARE: Admit to inpatient   PATIENT DISPOSITION:  PACU - hemodynamically stable.   Delay start of Pharmacological VTE agent (>24hrs) due to surgical blood loss or risk of bleeding: yes

## 2019-08-04 NOTE — Anesthesia Procedure Notes (Signed)
Procedure Name: Intubation Date/Time: 08/04/2019 7:40 AM Performed by: Barrington Ellison, CRNA Pre-anesthesia Checklist: Patient identified, Emergency Drugs available, Suction available and Patient being monitored Patient Re-evaluated:Patient Re-evaluated prior to induction Oxygen Delivery Method: Circle System Utilized Preoxygenation: Pre-oxygenation with 100% oxygen Induction Type: IV induction, Rapid sequence and Cricoid Pressure applied Laryngoscope Size: Mac and 3 Grade View: Grade I Tube type: Oral Tube size: 7.0 mm Number of attempts: 1 Airway Equipment and Method: Stylet and Oral airway Placement Confirmation: ETT inserted through vocal cords under direct vision,  positive ETCO2 and breath sounds checked- equal and bilateral Secured at: 22 cm Tube secured with: Tape Dental Injury: Teeth and Oropharynx as per pre-operative assessment  Comments: RSI d/t patient expressed feelings of nausea prior to entering OR

## 2019-08-04 NOTE — Interval H&P Note (Signed)
History and Physical Interval Note:  08/04/2019 7:24 AM  Stacy Wagner  has presented today for surgery, with the diagnosis of Sagittal plane imbalance.  The various methods of treatment have been discussed with the patient and family. After consideration of risks, benefits and other options for treatment, the patient has consented to  Procedure(s) with comments: Lumbar 5 Sacral 1 Anterior lumbar interbody fusion (N/A) - Dr Deitra Mayo to assist Lumbar 1-2 Lumbar 2-3 Lumbar 3-4 Lumbar 4-5 Anterolateral lumbar interbody fusion (N/A) ABDOMINAL EXPOSURE (N/A) as a surgical intervention.  The patient's history has been reviewed, patient examined, no change in status, stable for surgery.  I have reviewed the patient's chart and labs.  Questions were answered to the patient's satisfaction.     Deitra Mayo

## 2019-08-04 NOTE — Anesthesia Postprocedure Evaluation (Signed)
Anesthesia Post Note  Patient: Stacy Wagner  Procedure(s) Performed: Lumbar Five - Sacral One  Anterior Lumbar Interbody Fusion (N/A Spine Lumbar) Lumbar One-Two, Lumbar Two-Three, Lumbar Three-Four, Lumbar Four-Five  Anterolateral Lumbar Interbody Fusion (Right Spine Lumbar) ABDOMINAL EXPOSURE (N/A )     Patient location during evaluation: PACU Anesthesia Type: General Level of consciousness: awake and alert Pain management: pain level controlled Vital Signs Assessment: post-procedure vital signs reviewed and stable Respiratory status: spontaneous breathing, nonlabored ventilation and respiratory function stable Cardiovascular status: blood pressure returned to baseline and stable Postop Assessment: no apparent nausea or vomiting Anesthetic complications: no    Last Vitals:  Vitals:   08/04/19 1400 08/04/19 1415  BP: 131/81 135/89  Pulse: 82 88  Resp: (!) 24 16  Temp:    SpO2: 98% 97%    Last Pain:  Vitals:   08/04/19 1355  PainSc: 6                  Kia Stavros,W. EDMOND

## 2019-08-05 ENCOUNTER — Inpatient Hospital Stay (HOSPITAL_COMMUNITY): Payer: Medicare Other

## 2019-08-05 NOTE — Social Work (Signed)
CSW acknowledging consult for HH/DME/SNF placement. Will follow for therapy recommendations needed to best determine disposition/for insurance authorization. Aware there is another procedure planned for later this week- TOC team to follow.    Westley Hummer, MSW, [place] [facility]

## 2019-08-05 NOTE — Progress Notes (Signed)
Subjective: Patient reports "feeling ok." She has c/o of RLE pain overnight. PT / OT at bedside ambulating patient. She reports no bowel movement or passing gas. No abdominal pain or incisional pain.   Objective: Vital signs in last 24 hours: Temp:  [96.8 F (36 C)-98.2 F (36.8 C)] 97.7 F (36.5 C) (06/09 0338) Pulse Rate:  [79-112] 107 (06/09 0338) Resp:  [12-31] 20 (06/08 1705) BP: (102-157)/(58-92) 102/65 (06/09 0338) SpO2:  [96 %-100 %] 97 % (06/09 0338) Arterial Line BP: (137-180)/(51-97) 156/97 (06/08 1415)  Intake/Output from previous day: 06/08 0701 - 06/09 0700 In: 3250 [I.V.:2800; IV Piggyback:450] Out: 1230 [Urine:1170; Blood:60] Intake/Output this shift: No intake/output data recorded.  Awake, alert, and conversant. Patient is doing well. MAEW with good power. Doing well. Incisions CDI.   Lab Results: No results for input(s): WBC, HGB, HCT, PLT in the last 72 hours. BMET No results for input(s): NA, K, CL, CO2, GLUCOSE, BUN, CREATININE, CALCIUM in the last 72 hours.  Studies/Results: DG Lumbar Spine 2-3 Views  Result Date: 08/04/2019 CLINICAL DATA:  L1-2 through L4-5 anterolateral interbody fusion. EXAM: DG C-ARM 1-60 MIN; LUMBAR SPINE - 2-3 VIEW COMPARISON:  MRI 06/07/2019 FINDINGS: Multiple C-arm images show performance of lateral discectomy and fusion procedures from L1-2 through L4-5. Previous anterior discectomy and fusion L5-S1. IMPRESSION: Lateral discectomy and fusion procedure from L1-2 through L4-5. Electronically Signed   By: Nelson Chimes M.D.   On: 08/04/2019 15:16   DG C-Arm 1-60 Min  Result Date: 08/04/2019 CLINICAL DATA:  L1-2 through L4-5 anterolateral interbody fusion. EXAM: DG C-ARM 1-60 MIN; LUMBAR SPINE - 2-3 VIEW COMPARISON:  MRI 06/07/2019 FINDINGS: Multiple C-arm images show performance of lateral discectomy and fusion procedures from L1-2 through L4-5. Previous anterior discectomy and fusion L5-S1. IMPRESSION: Lateral discectomy and fusion  procedure from L1-2 through L4-5. Electronically Signed   By: Nelson Chimes M.D.   On: 08/04/2019 15:16   DG OR LOCAL ABDOMEN  Result Date: 08/04/2019 CLINICAL DATA:  ALIF EXAM: OR LOCAL ABDOMEN COMPARISON:  06/07/2019 MRI FINDINGS: L5-S1 ALIF cage. Exposure from the left with extraperitoneal gas seen along the pelvic sidewall. Clip is present in the right hemipelvis from prior procedure. No unexpected foreign body. Findings called to [id number] at 958 am. IMPRESSION: L5-S1 ALIF.  No unexpected foreign body. Electronically Signed   By: Monte Fantasia M.D.   On: 08/04/2019 10:00    Assessment/Plan: Continue TSLO brace when out of bed. Continue ambulating and working with therapies. Plan for T10 to pelvis fixation on Friday.    LOS: 1 day     Verdis Prime 08/05/2019, 8:36 AM  Patient is having right leg pain in upper thigh, likely secondary to trans-psoas surgical approach. This should resolve with time.

## 2019-08-05 NOTE — Evaluation (Signed)
Physical Therapy Evaluation Patient Details Name: Stacy Wagner MRN: [id number] DOB: 1940/11/28 Today's Date: 08/05/2019   History of Present Illness  Pt is a 79 yo female admitted long history of back pain, scoliosis and previous back surgeries. Pt underwent a L4-L5 ALIF and L1-L5 R XLIF and plans to undergo a T10-pelvis fixation this coming Friday, June 11.      Clinical Impression  Pt admitted with above. Pt with c/o 10/10 R hip/anterior thigh pain stating "oh that sciatic nerve."  Aaron Edelman and Josh from neurosurgery aware. Despite pain pt was able to get up and ambulate about 10' with RW. Aware pt planned to have 2nd surgery on Friday 6/11 and will re-evaluate pt and d/c recommendations at that time. Acute PT to cont to follow.    Follow Up Recommendations Other (comment)(pt going for 2nd surgery on Friday,will assess d/c recs then)    Equipment Recommendations  Rolling walker with 5" wheels    Recommendations for Other Services       Precautions / Restrictions Precautions Precautions: Back;Fall Precaution Booklet Issued: Yes (comment) Precaution Comments: precautions need to be reviewed. Required Braces or Orthoses: Spinal Brace Spinal Brace: Thoracolumbosacral orthotic;Applied in sitting position Restrictions Weight Bearing Restrictions: No      Mobility  Bed Mobility Overal bed mobility: Needs Assistance Bed Mobility: Rolling;Sidelying to Sit Rolling: Min assist Sidelying to sit: Mod assist       General bed mobility comments: assist to come to full sitting. pt in a lot of pain but did move self to EOB well., mod A for trunk elevation  Transfers Overall transfer level: Needs assistance Equipment used: Rolling walker (2 wheeled) Transfers: Sit to/from Stand Sit to Stand: Mod assist;+2 physical assistance Stand pivot transfers: Mod assist;+2 physical assistance       General transfer comment: modAx2 to power up and support during transition of hands from bed to  RW, pt with decreased WBing tolerance on R LE  Ambulation/Gait Ambulation/Gait assistance: Mod assist;+2 safety/equipment Gait Distance (Feet): 10 Feet Assistive device: Rolling walker (2 wheeled) Gait Pattern/deviations: Step-to pattern;Decreased stride length;Decreased step length - right;Decreased stance time - right;Trunk flexed Gait velocity: slow Gait velocity interpretation: <1.8 ft/sec, indicate of risk for recurrent falls General Gait Details: pt with decreased R LE WBing tolerance due to pain, modA to advance R LE due to pain, unable to clear foot without assist. ambulation tolerance limited by R LE pain  Stairs            Wheelchair Mobility    Modified Rankin (Stroke Patients Only)       Balance Overall balance assessment: Needs assistance Sitting-balance support: Feet supported;Bilateral upper extremity supported Sitting balance-Leahy Scale: Fair   Postural control: Posterior lean Standing balance support: Bilateral upper extremity supported;During functional activity Standing balance-Leahy Scale: Poor Standing balance comment: Pt heavily reliant on outside support to remain standing.                             Pertinent Vitals/Pain Pain Assessment: 0-10 Pain Score: 10-Worst pain ever Pain Location: R hip and down leg Pain Descriptors / Indicators: Aching;Crying;Guarding;Grimacing;Sore;Throbbing Pain Intervention(s): Limited activity within patient's tolerance    Home Living Family/patient expects to be discharged to:: Private residence Living Arrangements: Spouse/significant other Available Help at Discharge: Family;Available 24 hours/day Type of Home: House Home Access: Stairs to enter Entrance Stairs-Rails: Can reach both(in back) Entrance Stairs-Number of Steps: 8 in back, 4 in front no  rails Home Layout: One level Home Equipment: Cane - single point;Shower seat - built in Additional Comments: pt may have access to [facility] and walker.     Prior Function Level of Independence: Independent with assistive device(s)         Comments: walked with cane at times when back bothered her     Hand Dominance   Dominant Hand: Right    Extremity/Trunk Assessment   Upper Extremity Assessment Upper Extremity Assessment: Overall WFL for tasks assessed    Lower Extremity Assessment Lower Extremity Assessment: RLE deficits/detail;LLE deficits/detail RLE Deficits / Details: limited WBing tolerance and active movement due to R hip/anterior thigh pain at 10/10 LLE Deficits / Details: generalized weakness from back surgery but able to move without assist and tolerate WBign    Cervical / Trunk Assessment Cervical / Trunk Assessment: Other exceptions Cervical / Trunk Exceptions: previous back surgeries/scoliosis  Communication   Communication: HOH  Cognition Arousal/Alertness: Awake/alert Behavior During Therapy: WFL for tasks assessed/performed Overall Cognitive Status: Within Functional Limits for tasks assessed                                        General Comments General comments (skin integrity, edema, etc.): VSS, pt did become diaphoretic during ambulation however BP stable 113/41. Upon standing pt with urinary incontinence, dependent for hygiene at this time    Exercises     Assessment/Plan    PT Assessment Patient needs continued PT services  PT Problem List Decreased strength;Decreased range of motion;Decreased activity tolerance;Decreased balance;Decreased mobility;Decreased coordination;Decreased cognition;Decreased knowledge of use of DME       PT Treatment Interventions DME instruction;Gait training;Stair training;Functional mobility training;Therapeutic activities;Therapeutic exercise;Balance training;Neuromuscular re-education    PT Goals (Current goals can be found in the Care Plan section)  Acute Rehab PT Goals Patient Stated Goal: to go home with less pain PT Goal Formulation: With  patient Time For Goal Achievement: 08/19/19 Potential to Achieve Goals: Good    Frequency Min 5X/week   Barriers to discharge        Co-evaluation PT/OT/SLP Co-Evaluation/Treatment: Yes Reason for Co-Treatment: Complexity of the patient's impairments (multi-system involvement) PT goals addressed during session: Mobility/safety with mobility OT goals addressed during session: ADL's and self-care       AM-PAC PT "6 Clicks" Mobility  Outcome Measure Help needed turning from your back to your side while in a flat bed without using bedrails?: A Little Help needed moving from lying on your back to sitting on the side of a flat bed without using bedrails?: A Little Help needed moving to and from a bed to a chair (including a wheelchair)?: A Little Help needed standing up from a chair using your arms (e.g., wheelchair or bedside chair)?: A Lot Help needed to walk in hospital room?: A Lot Help needed climbing 3-5 steps with a railing? : Total 6 Click Score: 14    End of Session Equipment Utilized During Treatment: Gait belt;Back brace Activity Tolerance: Patient limited by pain Patient left: in chair;with call bell/phone within reach;with chair alarm set;with family/visitor present Nurse Communication: Mobility status PT Visit Diagnosis: Difficulty in walking, not elsewhere classified (R26.2);Pain;Unsteadiness on feet (R26.81) Pain - part of body: (back)    Time: 7169-6789 PT Time Calculation (min) (ACUTE ONLY): 35 min   Charges:   PT Evaluation $PT Eval Moderate Complexity: 1 Mod  Kittie Plater, PT, DPT Acute Rehabilitation Services Pager #: [phone] Office #: [phone]   Berline Lopes 08/05/2019, 11:17 AM

## 2019-08-05 NOTE — Progress Notes (Addendum)
  Progress Note    08/05/2019 7:36 AM 1 Day Post-Op  Subjective: right lower back and right lower extremity pain. States she had trouble sleeping over night due to pain. Has not ambulated yet. Foley removed this morning and she has not yet voided    Vitals:   08/04/19 2332 08/05/19 0338  BP: 133/60 102/65  Pulse: (!) 108 (!) 107  Resp:    Temp: 97.6 F (36.4 C) 97.7 F (36.5 C)  SpO2: 97% 97%   Physical Exam: Cardiac:  regular Lungs: non labored Incisions: anterior Transverse abdominal incision clean dry and intact. Right lateral and right posterior incisions clean dry and intact. No swelling or ecchymosis present  Extremities: 2 + femoral pulses bilaterally, Palpable DP pulses bilaterally. Feet warm and well perfused with motor and sensory intact Abdomen: soft, non tender, non distended Neurologic: alert and oriented  CBC    Component Value Date/Time   WBC 6.7 07/31/2019 1039   RBC 4.59 07/31/2019 1039   HGB 14.2 07/31/2019 1039   HCT 42.4 07/31/2019 1039   PLT 302 07/31/2019 1039   MCV 92.4 07/31/2019 1039   MCH 30.9 07/31/2019 1039   MCHC 33.5 07/31/2019 1039   RDW 12.5 07/31/2019 1039   LYMPHSABS 1.7 09/27/2010 1146   MONOABS 0.7 09/27/2010 1146   EOSABS 0.7 09/27/2010 1146   BASOSABS 0.1 09/27/2010 1146    BMET    Component Value Date/Time   NA 140 07/31/2019 1039   K 3.8 07/31/2019 1039   CL 108 07/31/2019 1039   CO2 24 07/31/2019 1039   GLUCOSE 79 07/31/2019 1039   BUN 21 07/31/2019 1039   CREATININE 0.77 07/31/2019 1039   CALCIUM 9.1 07/31/2019 1039   GFRNONAA >60 07/31/2019 1039   GFRAA >60 07/31/2019 1039    INR No results found for: INR   Intake/Output Summary (Last 24 hours) at 08/05/2019 0736 Last data filed at 08/05/2019 0341 Gross per 24 hour  Intake 3250 ml  Output 1230 ml  Net 2020 ml     Assessment/Plan:  79 y.o. female is s/p Anterior retroperitoneal exposure L5-S1 1 Day Post-Op. Doing well post op. Continue pain control.  Increase mobility as tolerated today. Incisions are all clean, dry and intact. Lower extremities are well perfused and warm with palpable DP pulses bilaterally. Doing well from vascular standpoint. VVS available as needed   [facility] [phone] 08/05/2019 7:36 AM   I have interviewed the patient and examined the patient. I agree with the findings by the PA.   Gae Gallop, MD [phone]

## 2019-08-05 NOTE — Evaluation (Signed)
Occupational Therapy Evaluation Patient Details Name: Stacy Wagner MRN: [id number] DOB: 09-23-1940 Today's Date: 08/05/2019    History of Present Illness Pt is a 79 yo female admitted long history of back pain, scoliosis and previous back surgeries. Pt underwent a L4-L5 ALIF and L1-L5 R XLIF and plans to undergo a T10-pelvis fixation this coming Friday, June 11.     Clinical Impression   Pt admitted with the above diagnosis and has the deficits listed below. Pt would benefit from cont OT to increase independence with basic adls and adl transfers so pt can dc home with her husband and daughter.  Pt is in significant pain today limiting ability to participate in adls but did mobilize to EOB and to chair. Pt was incontinent of bladder this am when standing which is not normal for her.  Nursing aware.  Pt with further surgery planned for this Friday, June 11.  Will determine d/c recommendations at that time.      Follow Up Recommendations  Other (comment)(2nd surgery planned for Friday. Will reeval then)    Equipment Recommendations  3 in 1 bedside commode    Recommendations for Other Services       Precautions / Restrictions Precautions Precautions: Back;Fall Precaution Booklet Issued: Yes (comment) Precaution Comments: precautions need to be reviewed. Required Braces or Orthoses: Spinal Brace Spinal Brace: Thoracolumbosacral orthotic;Applied in sitting position Restrictions Weight Bearing Restrictions: No      Mobility Bed Mobility Overal bed mobility: Needs Assistance Bed Mobility: Rolling;Sidelying to Sit Rolling: Min assist Sidelying to sit: Mod assist       General bed mobility comments: assist to come to full sitting. pt in a lot of pain but did move self to EOB well.  Transfers Overall transfer level: Needs assistance   Transfers: Sit to/from Stand;Stand Pivot Transfers Sit to Stand: Mod assist;+2 physical assistance Stand pivot transfers: Mod assist;+2  physical assistance       General transfer comment: Pt with difficulty advancing R foot due to pain    Balance Overall balance assessment: Needs assistance Sitting-balance support: Feet supported;Bilateral upper extremity supported Sitting balance-Leahy Scale: Fair   Postural control: Posterior lean Standing balance support: Bilateral upper extremity supported;During functional activity Standing balance-Leahy Scale: Poor Standing balance comment: Pt heavily reliant on outside support to remain standing.                           ADL either performed or assessed with clinical judgement   ADL Overall ADL's : Needs assistance/impaired Eating/Feeding: Set up;Sitting   Grooming: Set up;Sitting   Upper Body Bathing: Minimal assistance;Sitting;Cueing for compensatory techniques   Lower Body Bathing: Maximal assistance;Sit to/from stand;Cueing for back precautions;Cueing for compensatory techniques   Upper Body Dressing : Moderate assistance;Sitting;Cueing for compensatory techniques Upper Body Dressing Details (indicate cue type and reason): full assist with brace at this time Lower Body Dressing: Maximal assistance;Cueing for compensatory techniques;Cueing for back precautions;Sit to/from stand Lower Body Dressing Details (indicate cue type and reason): will benefit from adaptive equipment Toilet Transfer: Moderate assistance;+2 for physical assistance;Stand-pivot;BSC;RW   Toileting- Clothing Manipulation and Hygiene: Maximal assistance;Sit to/from stand;Cueing for back precautions;Cueing for compensatory techniques       Functional mobility during ADLs: Moderate assistance;+2 for physical assistance;+2 for safety/equipment;Rolling walker;Cueing for sequencing General ADL Comments: Pt requires a great amount of assist with LE adls at this time.  Pt fatigued and in a lot of pain.     Vision Baseline  Vision/History: No visual deficits Patient Visual Report: No change  from baseline Vision Assessment?: No apparent visual deficits     Perception     Praxis      Pertinent Vitals/Pain Pain Assessment: 0-10 Pain Score: 10-Worst pain ever Pain Location: R hip and down leg Pain Descriptors / Indicators: Aching;Crying;Guarding;Grimacing;Sore;Throbbing Pain Intervention(s): Limited activity within patient's tolerance;Monitored during session;Premedicated before session;Repositioned     Hand Dominance Right   Extremity/Trunk Assessment Upper Extremity Assessment Upper Extremity Assessment: Overall WFL for tasks assessed   Lower Extremity Assessment Lower Extremity Assessment: Defer to PT evaluation   Cervical / Trunk Assessment Cervical / Trunk Assessment: Other exceptions Cervical / Trunk Exceptions: previous back surgeries/scoliosis   Communication Communication Communication: HOH   Cognition Arousal/Alertness: Awake/alert Behavior During Therapy: WFL for tasks assessed/performed Overall Cognitive Status: Within Functional Limits for tasks assessed                                     General Comments  Pt very limited by pain and weakness.    Exercises     Shoulder Instructions      Home Living Family/patient expects to be discharged to:: Private residence Living Arrangements: Spouse/significant other Available Help at Discharge: Family;Available 24 hours/day Type of Home: House Home Access: Stairs to enter [employer] of Steps: 8 in back, 4 in front no rails Entrance Stairs-Rails: Can reach both(in back) Home Layout: One level     Bathroom Shower/Tub: Walk-in shower;Door   [employer] Toilet: Standard     Home Equipment: Kasandra Knudsen - single point;Shower seat - built in   Additional Comments: pt may have access to [facility] and walker.      Prior Functioning/Environment Level of Independence: Independent with assistive device(s)        Comments: walked with cane at times when back bothered her        OT  Problem List: Decreased strength;Decreased activity tolerance;Impaired balance (sitting and/or standing);Decreased knowledge of use of DME or AE;Decreased knowledge of precautions;Pain      OT Treatment/Interventions: Self-care/ADL training;DME and/or AE instruction;Therapeutic activities;Balance training    OT Goals(Current goals can be found in the care plan section) Acute Rehab OT Goals Patient Stated Goal: to go home with less pain OT Goal Formulation: With patient Time For Goal Achievement: 08/19/19 Potential to Achieve Goals: Good ADL Goals Pt Will Perform Grooming: with min guard assist;standing Pt Will Perform Upper Body Dressing: with supervision;sitting Pt Will Perform Lower Body Dressing: with min assist;with adaptive equipment;sit to/from stand Pt Will Transfer to Toilet: with min guard assist;ambulating Pt Will Perform Toileting - Clothing Manipulation and hygiene: with min assist;sit to/from stand Pt Will Perform Tub/Shower Transfer: Shower transfer;with min assist;ambulating;rolling walker;shower seat  OT Frequency: Min 2X/week   Barriers to D/C:    daughter in for the next week to assist in caring for pt.       Co-evaluation PT/OT/SLP Co-Evaluation/Treatment: Yes Reason for Co-Treatment: Complexity of the patient's impairments (multi-system involvement) PT goals addressed during session: Mobility/safety with mobility OT goals addressed during session: ADL's and self-care      AM-PAC OT "6 Clicks" Daily Activity     Outcome Measure Help from another person eating meals?: None Help from another person taking care of personal grooming?: None Help from another person toileting, which includes using toliet, bedpan, or urinal?: A Lot Help from another person bathing (including washing, rinsing, drying)?: A Lot Help  from another person to put on and taking off regular upper body clothing?: A Lot Help from another person to put on and taking off regular lower body  clothing?: A Lot 6 Click Score: 16   End of Session Equipment Utilized During Treatment: Gait belt;Rolling walker Nurse Communication: Mobility status  Activity Tolerance: Patient limited by pain Patient left: in chair;with call bell/phone within reach;with chair alarm set  OT Visit Diagnosis: Unsteadiness on feet (R26.81);Other abnormalities of gait and mobility (R26.89);Pain Pain - Right/Left: Right Pain - part of body: Hip                Time: 3374-4514 OT Time Calculation (min): 34 min Charges:  OT General Charges $OT Visit: 1 Visit OT Evaluation $OT Eval Moderate Complexity: 1 Mod  Glenford Peers 08/05/2019, 8:37 AM

## 2019-08-06 MED ORDER — METHOCARBAMOL 750 MG PO TABS
750.0000 mg | ORAL_TABLET | Freq: Four times a day (QID) | ORAL | Status: DC | PRN
  Administered 2019-08-06 – 2019-08-09 (×6): 750 mg via ORAL
  Filled 2019-08-06 (×6): qty 1

## 2019-08-06 MED ORDER — DEXAMETHASONE SODIUM PHOSPHATE 4 MG/ML IJ SOLN
4.0000 mg | Freq: Three times a day (TID) | INTRAMUSCULAR | Status: DC
  Administered 2019-08-06 – 2019-08-10 (×11): 4 mg via INTRAVENOUS
  Filled 2019-08-06 (×11): qty 1

## 2019-08-06 MED ORDER — CHLORHEXIDINE GLUCONATE CLOTH 2 % EX PADS
6.0000 | MEDICATED_PAD | Freq: Once | CUTANEOUS | Status: AC
  Administered 2019-08-07: 6 via TOPICAL

## 2019-08-06 MED ORDER — CHLORHEXIDINE GLUCONATE CLOTH 2 % EX PADS
6.0000 | MEDICATED_PAD | Freq: Once | CUTANEOUS | Status: AC
  Administered 2019-08-06: 6 via TOPICAL

## 2019-08-06 MED ORDER — DEXAMETHASONE SODIUM PHOSPHATE 10 MG/ML IJ SOLN
6.0000 mg | Freq: Once | INTRAMUSCULAR | Status: AC
  Administered 2019-08-06: 6 mg via INTRAVENOUS
  Filled 2019-08-06: qty 0.6

## 2019-08-06 MED FILL — Sodium Chloride IV Soln 0.9%: INTRAVENOUS | Qty: 1000 | Status: AC

## 2019-08-06 MED FILL — Heparin Sodium (Porcine) Inj 1000 Unit/ML: INTRAMUSCULAR | Qty: 30 | Status: AC

## 2019-08-06 NOTE — Progress Notes (Signed)
Subjective: Patient reports worsening pain in her RLE that began this morning. She stated that yesterday she did well ambulating with PT/OT and ambulated 6-7 times to the bedside comode with little pain. Upon wakening this morning, she stated the "pain was so bas I was in tears." She reports the pain medication works temporarily but the pain returns and is a 10/10. Other than the RLE pain, she reports no issues or concerns.   Objective: Vital signs in last 24 hours: Temp:  [97.7 F (36.5 C)-98.4 F (36.9 C)] 98.3 F (36.8 C) (06/10 0810) Pulse Rate:  [91-95] 94 (06/10 0810) Resp:  [15-18] 15 (06/10 0810) BP: (112-125)/(43-67) 115/43 (06/10 0810) SpO2:  [99 %] 99 % (06/10 0810)  Intake/Output from previous day: 06/09 0701 - 06/10 0700 In: 2529.3 [P.O.:700; I.V.:1829.3] Out: -  Intake/Output this shift: No intake/output data recorded.  Physical Exam: Awake, A/O X 4, and conversant. Patient is doing well overall. MAEW with good power. Doing well. Incisions CDI. Decreased strength in her RLE but is secondary to RLE pain.   Lab Results: No results for input(s): WBC, HGB, HCT, PLT in the last 72 hours. BMET No results for input(s): NA, K, CL, CO2, GLUCOSE, BUN, CREATININE, CALCIUM in the last 72 hours.  Studies/Results: DG Lumbar Spine 2-3 Views  Result Date: 08/04/2019 CLINICAL DATA:  L1-2 through L4-5 anterolateral interbody fusion. EXAM: DG C-ARM 1-60 MIN; LUMBAR SPINE - 2-3 VIEW COMPARISON:  MRI 06/07/2019 FINDINGS: Multiple C-arm images show performance of lateral discectomy and fusion procedures from L1-2 through L4-5. Previous anterior discectomy and fusion L5-S1. IMPRESSION: Lateral discectomy and fusion procedure from L1-2 through L4-5. Electronically Signed   By: Nelson Chimes M.D.   On: 08/04/2019 15:16   DG C-Arm 1-60 Min  Result Date: 08/04/2019 CLINICAL DATA:  L1-2 through L4-5 anterolateral interbody fusion. EXAM: DG C-ARM 1-60 MIN; LUMBAR SPINE - 2-3 VIEW COMPARISON:  MRI  06/07/2019 FINDINGS: Multiple C-arm images show performance of lateral discectomy and fusion procedures from L1-2 through L4-5. Previous anterior discectomy and fusion L5-S1. IMPRESSION: Lateral discectomy and fusion procedure from L1-2 through L4-5. Electronically Signed   By: Nelson Chimes M.D.   On: 08/04/2019 15:16   DG SCOLIOSIS EVAL COMPLETE SPINE 2 OR 3 VIEWS  Result Date: 08/05/2019 CLINICAL DATA:  L1-S1 interbody fusion EXAM: DG SCOLIOSIS EVAL COMPLETE SPINE 2-3V COMPARISON:  01/21/2019 FINDINGS: There are 12 rib-bearing thoracic type vertebral segments and 5 non rib-bearing lumbar type vertebral segments. Interval multilevel interbody fusion from L1-2 through L5-S1. Improved lumbar curvature compared to preoperative films. There is residual focal dextrocurvature at the T12-L1 level, which is similar to prior. No significant coronal imbalance. Heart size normal. Lung fields clear. Postsurgical changes within the abdomen and overlying soft tissues related to spinal fusion. IMPRESSION: Interval L1-2 through L5-S1 interbody fusion with improved lumbar curvature compared to preoperative films. No significant coronal imbalance. Electronically Signed   By: Davina Poke D.O.   On: 08/05/2019 12:47   DG OR LOCAL ABDOMEN  Result Date: 08/04/2019 CLINICAL DATA:  ALIF EXAM: OR LOCAL ABDOMEN COMPARISON:  06/07/2019 MRI FINDINGS: L5-S1 ALIF cage. Exposure from the left with extraperitoneal gas seen along the pelvic sidewall. Clip is present in the right hemipelvis from prior procedure. No unexpected foreign body. Findings called to [id number] at 958 am. IMPRESSION: L5-S1 ALIF.  No unexpected foreign body. Electronically Signed   By: Monte Fantasia M.D.   On: 08/04/2019 10:00    Assessment/Plan: Patient is having right leg pain  in upper thigh, likely secondary to trans-psoas surgical approach. This should resolve with time. Will add gabapentin 300 mg TID, decadron 6 mg now and then decadron 4 mg Q8H,  methocarbamol 750 mg Q8H, and diazepam 5 mg Q6H. Continue current pain medications. Continue TSLO brace when out of bed. Continue ambulating and working with therapies.   Plan for T10 to pelvis fixation on Friday morning. Will need consent signed. NPO at midnight.    Her scoliotic curvature has improved from preop with coronal curve reduced from 30 to 10 degrees and sagittal imbalance reduced from 21 mm to 3 mm.     LOS: 2 days    [name], MD 08/06/2019, 9:35 AM

## 2019-08-06 NOTE — H&P (View-Only) (Signed)
Subjective: Patient reports worsening pain in her RLE that began this morning. She stated that yesterday she did well ambulating with PT/OT and ambulated 6-7 times to the bedside comode with little pain. Upon wakening this morning, she stated the "pain was so bas I was in tears." She reports the pain medication works temporarily but the pain returns and is a 10/10. Other than the RLE pain, she reports no issues or concerns.   Objective: Vital signs in last 24 hours: Temp:  [97.7 F (36.5 C)-98.4 F (36.9 C)] 98.3 F (36.8 C) (06/10 0810) Pulse Rate:  [91-95] 94 (06/10 0810) Resp:  [15-18] 15 (06/10 0810) BP: (112-125)/(43-67) 115/43 (06/10 0810) SpO2:  [99 %] 99 % (06/10 0810)  Intake/Output from previous day: 06/09 0701 - 06/10 0700 In: 2529.3 [P.O.:700; I.V.:1829.3] Out: -  Intake/Output this shift: No intake/output data recorded.  Physical Exam: Awake, A/O X 4, and conversant. Patient is doing well overall. MAEW with good power. Doing well. Incisions CDI. Decreased strength in her RLE but is secondary to RLE pain.   Lab Results: No results for input(s): WBC, HGB, HCT, PLT in the last 72 hours. BMET No results for input(s): NA, K, CL, CO2, GLUCOSE, BUN, CREATININE, CALCIUM in the last 72 hours.  Studies/Results: DG Lumbar Spine 2-3 Views  Result Date: 08/04/2019 CLINICAL DATA:  L1-2 through L4-5 anterolateral interbody fusion. EXAM: DG C-ARM 1-60 MIN; LUMBAR SPINE - 2-3 VIEW COMPARISON:  MRI 06/07/2019 FINDINGS: Multiple C-arm images show performance of lateral discectomy and fusion procedures from L1-2 through L4-5. Previous anterior discectomy and fusion L5-S1. IMPRESSION: Lateral discectomy and fusion procedure from L1-2 through L4-5. Electronically Signed   By: Nelson Chimes M.D.   On: 08/04/2019 15:16   DG C-Arm 1-60 Min  Result Date: 08/04/2019 CLINICAL DATA:  L1-2 through L4-5 anterolateral interbody fusion. EXAM: DG C-ARM 1-60 MIN; LUMBAR SPINE - 2-3 VIEW COMPARISON:  MRI  06/07/2019 FINDINGS: Multiple C-arm images show performance of lateral discectomy and fusion procedures from L1-2 through L4-5. Previous anterior discectomy and fusion L5-S1. IMPRESSION: Lateral discectomy and fusion procedure from L1-2 through L4-5. Electronically Signed   By: Nelson Chimes M.D.   On: 08/04/2019 15:16   DG SCOLIOSIS EVAL COMPLETE SPINE 2 OR 3 VIEWS  Result Date: 08/05/2019 CLINICAL DATA:  L1-S1 interbody fusion EXAM: DG SCOLIOSIS EVAL COMPLETE SPINE 2-3V COMPARISON:  01/21/2019 FINDINGS: There are 12 rib-bearing thoracic type vertebral segments and 5 non rib-bearing lumbar type vertebral segments. Interval multilevel interbody fusion from L1-2 through L5-S1. Improved lumbar curvature compared to preoperative films. There is residual focal dextrocurvature at the T12-L1 level, which is similar to prior. No significant coronal imbalance. Heart size normal. Lung fields clear. Postsurgical changes within the abdomen and overlying soft tissues related to spinal fusion. IMPRESSION: Interval L1-2 through L5-S1 interbody fusion with improved lumbar curvature compared to preoperative films. No significant coronal imbalance. Electronically Signed   By: Davina Poke D.O.   On: 08/05/2019 12:47   DG OR LOCAL ABDOMEN  Result Date: 08/04/2019 CLINICAL DATA:  ALIF EXAM: OR LOCAL ABDOMEN COMPARISON:  06/07/2019 MRI FINDINGS: L5-S1 ALIF cage. Exposure from the left with extraperitoneal gas seen along the pelvic sidewall. Clip is present in the right hemipelvis from prior procedure. No unexpected foreign body. Findings called to [id number] at 958 am. IMPRESSION: L5-S1 ALIF.  No unexpected foreign body. Electronically Signed   By: Monte Fantasia M.D.   On: 08/04/2019 10:00    Assessment/Plan: Patient is having right leg pain  in upper thigh, likely secondary to trans-psoas surgical approach. This should resolve with time. Will add gabapentin 300 mg TID, decadron 6 mg now and then decadron 4 mg Q8H,  methocarbamol 750 mg Q8H, and diazepam 5 mg Q6H. Continue current pain medications. Continue TSLO brace when out of bed. Continue ambulating and working with therapies.   Plan for T10 to pelvis fixation on Friday morning. Will need consent signed. NPO at midnight.    Her scoliotic curvature has improved from preop with coronal curve reduced from 30 to 10 degrees and sagittal imbalance reduced from 21 mm to 3 mm.     LOS: 2 days    [name], MD 08/06/2019, 9:35 AM

## 2019-08-06 NOTE — Progress Notes (Signed)
PT Cancellation Note  Patient Details Name: Stacy Wagner MRN: [id number] DOB: 28-Apr-1940   Cancelled Treatment:    Reason Eval/Treat Not Completed: Pain limiting ability to participate. Pt refusing PT intervention at this time due to excruciating pain with any movement of RLE. Pt requesting to hold further therapy until after surgery tomorrow. PT will attempt to follow up after surgery when pt is medically appropriate for PT intervention.   Zenaida Niece 08/06/2019, 3:44 PM

## 2019-08-07 ENCOUNTER — Inpatient Hospital Stay (HOSPITAL_COMMUNITY): Admission: RE | Admit: 2019-08-07 | Payer: Medicare Other | Source: Home / Self Care | Admitting: Neurosurgery

## 2019-08-07 ENCOUNTER — Inpatient Hospital Stay (HOSPITAL_COMMUNITY): Payer: Medicare Other | Admitting: Anesthesiology

## 2019-08-07 ENCOUNTER — Encounter (HOSPITAL_COMMUNITY)
Admission: RE | Disposition: A | Discharge status: Rehabilitation Facility | Payer: Self-pay | Source: Home / Self Care | Attending: Neurosurgery

## 2019-08-07 ENCOUNTER — Inpatient Hospital Stay (HOSPITAL_COMMUNITY): Payer: Medicare Other

## 2019-08-07 HISTORY — PX: APPLICATION OF INTRAOPERATIVE CT SCAN: SHX6668

## 2019-08-07 HISTORY — PX: POSTERIOR LUMBAR FUSION 4 LEVEL: SHX6037

## 2019-08-07 SURGERY — POSTERIOR LUMBAR FUSION 4 LEVEL
Anesthesia: General | Site: Back

## 2019-08-07 MED ORDER — LIDOCAINE-EPINEPHRINE 1 %-1:100000 IJ SOLN
INTRAMUSCULAR | Status: DC | PRN
  Administered 2019-08-07: 15 mL

## 2019-08-07 MED ORDER — ONDANSETRON HCL 4 MG/2ML IJ SOLN
INTRAMUSCULAR | Status: DC | PRN
  Administered 2019-08-07: 4 mg via INTRAVENOUS

## 2019-08-07 MED ORDER — LACTATED RINGERS IV SOLN: INTRAVENOUS | Status: DC | PRN

## 2019-08-07 MED ORDER — PROPOFOL 10 MG/ML IV BOLUS
INTRAVENOUS | Status: DC | PRN
  Administered 2019-08-07: 130 mg via INTRAVENOUS

## 2019-08-07 MED ORDER — PROPOFOL 500 MG/50ML IV EMUL
INTRAVENOUS | Status: DC | PRN
  Administered 2019-08-07: 15 ug/kg/min via INTRAVENOUS

## 2019-08-07 MED ORDER — TRANEXAMIC ACID 1000 MG/10ML IV SOLN
2000.0000 mg | Freq: Once | INTRAVENOUS | Status: AC
  Administered 2019-08-07: 2000 mg via TOPICAL
  Filled 2019-08-07: qty 20

## 2019-08-07 MED ORDER — PHENYLEPHRINE HCL-NACL 10-0.9 MG/250ML-% IV SOLN
INTRAVENOUS | Status: DC | PRN
  Administered 2019-08-07: 15 ug/min via INTRAVENOUS

## 2019-08-07 MED ORDER — LIDOCAINE 2% (20 MG/ML) 5 ML SYRINGE
INTRAMUSCULAR | Status: DC | PRN
  Administered 2019-08-07: 40 mg via INTRAVENOUS

## 2019-08-07 MED ORDER — BUPIVACAINE LIPOSOME 1.3 % IJ SUSP
INTRAMUSCULAR | Status: DC | PRN
  Administered 2019-08-07: 15 mL

## 2019-08-07 MED ORDER — 0.9 % SODIUM CHLORIDE (POUR BTL) OPTIME
TOPICAL | Status: DC | PRN
  Administered 2019-08-07: 1000 mL

## 2019-08-07 MED ORDER — FENTANYL CITRATE (PF) 100 MCG/2ML IJ SOLN
INTRAMUSCULAR | Status: AC
  Filled 2019-08-07: qty 2

## 2019-08-07 MED ORDER — ACETAMINOPHEN 10 MG/ML IV SOLN
INTRAVENOUS | Status: DC | PRN
Start: 2019-08-07 — End: 2019-08-07
  Administered 2019-08-07: 1000 mg via INTRAVENOUS

## 2019-08-07 MED ORDER — THROMBIN 20000 UNITS EX SOLR
CUTANEOUS | Status: DC | PRN
  Administered 2019-08-07: 20 mL

## 2019-08-07 MED ORDER — ONDANSETRON HCL 4 MG/2ML IJ SOLN: 4.0000 mg | Freq: Once | INTRAMUSCULAR | Status: DC | PRN

## 2019-08-07 MED ORDER — CEFAZOLIN SODIUM-DEXTROSE 2-3 GM-%(50ML) IV SOLR
INTRAVENOUS | Status: DC | PRN
Start: 2019-08-07 — End: 2019-08-07
  Administered 2019-08-07 (×2): 2 g via INTRAVENOUS

## 2019-08-07 MED ORDER — THROMBIN 20000 UNITS EX SOLR
CUTANEOUS | Status: AC
  Filled 2019-08-07: qty 20000

## 2019-08-07 MED ORDER — HYDROMORPHONE HCL 1 MG/ML IJ SOLN
INTRAMUSCULAR | Status: AC
  Filled 2019-08-07: qty 1

## 2019-08-07 MED ORDER — DEXAMETHASONE SODIUM PHOSPHATE 10 MG/ML IJ SOLN
INTRAMUSCULAR | Status: DC | PRN
  Administered 2019-08-07: 10 mg via INTRAVENOUS

## 2019-08-07 MED ORDER — ALBUMIN HUMAN 5 % IV SOLN
INTRAVENOUS | Status: DC | PRN
Start: 2019-08-07 — End: 2019-08-07

## 2019-08-07 MED ORDER — FENTANYL CITRATE (PF) 250 MCG/5ML IJ SOLN
INTRAMUSCULAR | Status: DC | PRN
  Administered 2019-08-07: 50 ug via INTRAVENOUS
  Administered 2019-08-07: 100 ug via INTRAVENOUS
  Administered 2019-08-07 (×3): 50 ug via INTRAVENOUS

## 2019-08-07 MED ORDER — THROMBIN 5000 UNITS EX SOLR
CUTANEOUS | Status: AC
  Filled 2019-08-07: qty 5000

## 2019-08-07 MED ORDER — BUPIVACAINE HCL (PF) 0.5 % IJ SOLN
INTRAMUSCULAR | Status: DC | PRN
  Administered 2019-08-07: 15 mL

## 2019-08-07 MED ORDER — FENTANYL CITRATE (PF) 100 MCG/2ML IJ SOLN
25.0000 ug | INTRAMUSCULAR | Status: DC | PRN
  Administered 2019-08-07 (×2): 25 ug via INTRAVENOUS

## 2019-08-07 MED ORDER — LIDOCAINE-EPINEPHRINE 1 %-1:100000 IJ SOLN
INTRAMUSCULAR | Status: AC
  Filled 2019-08-07: qty 1

## 2019-08-07 MED ORDER — BUPIVACAINE HCL (PF) 0.5 % IJ SOLN
INTRAMUSCULAR | Status: AC
  Filled 2019-08-07: qty 30

## 2019-08-07 MED ORDER — THROMBIN 5000 UNITS EX SOLR
OROMUCOSAL | Status: DC | PRN
  Administered 2019-08-07: 5 mL

## 2019-08-07 MED ORDER — BUPIVACAINE LIPOSOME 1.3 % IJ SUSP
20.0000 mL | Freq: Once | INTRAMUSCULAR | Status: AC
  Administered 2019-08-07: 20 mL
  Filled 2019-08-07: qty 20

## 2019-08-07 MED ORDER — ROCURONIUM BROMIDE 10 MG/ML (PF) SYRINGE
PREFILLED_SYRINGE | INTRAVENOUS | Status: DC | PRN
  Administered 2019-08-07: 60 mg via INTRAVENOUS
  Administered 2019-08-07: 30 mg via INTRAVENOUS
  Administered 2019-08-07 (×2): 20 mg via INTRAVENOUS
  Administered 2019-08-07: 30 mg via INTRAVENOUS
  Administered 2019-08-07 (×2): 20 mg via INTRAVENOUS

## 2019-08-07 MED ORDER — ACETAMINOPHEN 10 MG/ML IV SOLN
INTRAVENOUS | Status: AC
  Filled 2019-08-07: qty 100

## 2019-08-07 SURGICAL SUPPLY — 87 items
BASKET BONE COLLECTION (BASKET) ×3 IMPLANT
BLADE CLIPPER SURG (BLADE) IMPLANT
BONE CANC CHIPS 40CC CAN1/2 (Bone Implant) ×9 IMPLANT
BUR MATCHSTICK NEURO 3.0 LAGG (BURR) ×3 IMPLANT
BUR PRECISION FLUTE 5.0 (BURR) ×3 IMPLANT
CANISTER SUCT 3000ML PPV (MISCELLANEOUS) ×3 IMPLANT
CARTRIDGE OIL MAESTRO DRILL (MISCELLANEOUS) ×4 IMPLANT
CHIPS CANC BONE 40CC CAN1/2 (Bone Implant) ×6 IMPLANT
CNTNR URN SCR LID CUP LEK RST (MISCELLANEOUS) ×2 IMPLANT
CONT SPEC 4OZ STRL OR WHT (MISCELLANEOUS) ×3
COVER BACK TABLE 60X90IN (DRAPES) ×9 IMPLANT
COVER WAND RF STERILE (DRAPES) ×6 IMPLANT
DECANTER SPIKE VIAL GLASS SM (MISCELLANEOUS) ×3 IMPLANT
DERMABOND ADVANCED (GAUZE/BANDAGES/DRESSINGS) ×3
DERMABOND ADVANCED .7 DNX12 (GAUZE/BANDAGES/DRESSINGS) ×2 IMPLANT
DIFFUSER DRILL AIR PNEUMATIC (MISCELLANEOUS) ×6 IMPLANT
DIGITIZER BENDINI (MISCELLANEOUS) ×1 IMPLANT
DRAPE C-ARM 42X72 X-RAY (DRAPES) ×3 IMPLANT
DRAPE C-ARMOR (DRAPES) ×3 IMPLANT
DRAPE LAPAROTOMY 100X72X124 (DRAPES) ×3 IMPLANT
DRAPE SCAN PATIENT (DRAPES) ×4 IMPLANT
DRAPE SURG 17X23 STRL (DRAPES) ×3 IMPLANT
DRSG OPSITE POSTOP 4X12 (GAUZE/BANDAGES/DRESSINGS) ×2 IMPLANT
DURAPREP 26ML APPLICATOR (WOUND CARE) ×3 IMPLANT
ELECT REM PT RETURN 9FT ADLT (ELECTROSURGICAL) ×3
ELECTRODE REM PT RTRN 9FT ADLT (ELECTROSURGICAL) ×2 IMPLANT
EVACUATOR 1/8 PVC DRAIN (DRAIN) ×1 IMPLANT
GAUZE 4X4 16PLY RFD (DISPOSABLE) IMPLANT
GAUZE SPONGE 4X4 12PLY STRL (GAUZE/BANDAGES/DRESSINGS) ×3 IMPLANT
GLOVE BIO SURGEON STRL SZ8 (GLOVE) ×6 IMPLANT
GLOVE BIOGEL PI IND STRL 8 (GLOVE) ×4 IMPLANT
GLOVE BIOGEL PI IND STRL 8.5 (GLOVE) ×4 IMPLANT
GLOVE BIOGEL PI INDICATOR 8 (GLOVE) ×2
GLOVE BIOGEL PI INDICATOR 8.5 (GLOVE) ×2
GLOVE ECLIPSE 8.0 STRL XLNG CF (GLOVE) ×6 IMPLANT
GLOVE EXAM NITRILE XL STR (GLOVE) IMPLANT
GOWN STRL REUS W/ TWL LRG LVL3 (GOWN DISPOSABLE) IMPLANT
GOWN STRL REUS W/ TWL XL LVL3 (GOWN DISPOSABLE) ×4 IMPLANT
GOWN STRL REUS W/TWL 2XL LVL3 (GOWN DISPOSABLE) ×6 IMPLANT
GOWN STRL REUS W/TWL LRG LVL3 (GOWN DISPOSABLE)
GOWN STRL REUS W/TWL XL LVL3 (GOWN DISPOSABLE) ×6
GRAFT BNE CHIP CANC 1-8 40 (Bone Implant) IMPLANT
HEMOSTAT POWDER KIT SURGIFOAM (HEMOSTASIS) ×3 IMPLANT
KIT BASIN OR (CUSTOM PROCEDURE TRAY) ×3 IMPLANT
KIT POSITION SURG JACKSON T1 (MISCELLANEOUS) ×3 IMPLANT
KIT TURNOVER KIT B (KITS) ×3 IMPLANT
MARKER SPHERE PSV REFLC 13MM (MARKER) ×16 IMPLANT
MILL MEDIUM DISP (BLADE) ×3 IMPLANT
MODULE POWER NUVASIVE (MISCELLANEOUS) IMPLANT
NDL HYPO 21X1.5 SAFETY (NEEDLE) IMPLANT
NDL HYPO 25X1 1.5 SAFETY (NEEDLE) ×2 IMPLANT
NDL SPNL 18GX3.5 QUINCKE PK (NEEDLE) IMPLANT
NEEDLE HYPO 21X1.5 SAFETY (NEEDLE) ×3 IMPLANT
NEEDLE HYPO 25X1 1.5 SAFETY (NEEDLE) ×3 IMPLANT
NEEDLE SPNL 18GX3.5 QUINCKE PK (NEEDLE) IMPLANT
NS IRRIG 1000ML POUR BTL (IV SOLUTION) ×3 IMPLANT
OIL CARTRIDGE MAESTRO DRILL (MISCELLANEOUS) ×6
PACK LAMINECTOMY NEURO (CUSTOM PROCEDURE TRAY) ×3 IMPLANT
PAD ARMBOARD 7.5X6 YLW CONV (MISCELLANEOUS) ×9 IMPLANT
PATTIES SURGICAL .5 X.5 (GAUZE/BANDAGES/DRESSINGS) IMPLANT
PATTIES SURGICAL .5 X1 (DISPOSABLE) IMPLANT
PATTIES SURGICAL 1X1 (DISPOSABLE) IMPLANT
POWER MODULE NUVASIVE (MISCELLANEOUS) ×3
ROD RELINE 5.5X500MM STRAIGHT (Rod) ×1 IMPLANT
ROD RELINE-O 5.5X300 STRT NS (Rod) IMPLANT
ROD RELINE-O 5.5X300MM STRT (Rod) ×3 IMPLANT
SCREW LOCK RELINE 5.5 TULIP (Screw) ×20 IMPLANT
SCREW RELINE-O POLY 5.5X45MM (Screw) ×4 IMPLANT
SCREW RELINE-O POLY 6.5X40 (Screw) ×6 IMPLANT
SCREW RELINE-O POLY 6.5X45 (Screw) ×4 IMPLANT
SCREW RELINE-O POLY 7.5X40 (Screw) ×4 IMPLANT
SCREW RELINE-O POLY 8.5C70MM (Screw) ×1 IMPLANT
SCREW SPINAL 8.5X80 RELINE (Screw) ×1 IMPLANT
SPONGE LAP 4X18 RFD (DISPOSABLE) IMPLANT
SPONGE SURGIFOAM ABS GEL 100 (HEMOSTASIS) IMPLANT
STAPLER SKIN PROX WIDE 3.9 (STAPLE) IMPLANT
SUT VIC AB 1 CT1 18XBRD ANBCTR (SUTURE) ×4 IMPLANT
SUT VIC AB 1 CT1 8-18 (SUTURE) ×6
SUT VIC AB 2-0 CT1 18 (SUTURE) ×6 IMPLANT
SUT VIC AB 3-0 SH 8-18 (SUTURE) ×6 IMPLANT
SYR 20ML LL LF (SYRINGE) ×1 IMPLANT
SYR 5ML LL (SYRINGE) IMPLANT
TOWEL GREEN STERILE (TOWEL DISPOSABLE) ×3 IMPLANT
TOWEL GREEN STERILE FF (TOWEL DISPOSABLE) ×3 IMPLANT
TRAP SPECIMEN MUCUS 40CC (MISCELLANEOUS) ×1 IMPLANT
TRAY FOLEY MTR SLVR 16FR STAT (SET/KITS/TRAYS/PACK) ×3 IMPLANT
WATER STERILE IRR 1000ML POUR (IV SOLUTION) ×3 IMPLANT

## 2019-08-07 NOTE — Progress Notes (Signed)
Awake, sleepy, but arousable.  Follows commands with good bilateral lower extremity strength.  Doing well.

## 2019-08-07 NOTE — Anesthesia Procedure Notes (Signed)
Procedure Name: Intubation Date/Time: 08/07/2019 7:45 AM Performed by: Glynda Jaeger, CRNA Pre-anesthesia Checklist: Patient identified, Patient being monitored, Timeout performed, Emergency Drugs available and Suction available Patient Re-evaluated:Patient Re-evaluated prior to induction Oxygen Delivery Method: Circle System Utilized Preoxygenation: Pre-oxygenation with 100% oxygen Induction Type: IV induction Ventilation: Mask ventilation without difficulty Laryngoscope Size: Mac and 3 Grade View: Grade II Tube type: Oral Tube size: 7.5 mm Number of attempts: 1 Airway Equipment and Method: Stylet Placement Confirmation: ETT inserted through vocal cords under direct vision,  positive ETCO2 and breath sounds checked- equal and bilateral Secured at: 21 cm Tube secured with: Tape Dental Injury: Teeth and Oropharynx as per pre-operative assessment

## 2019-08-07 NOTE — Anesthesia Preprocedure Evaluation (Signed)
Anesthesia Evaluation  Patient identified by MRN, date of birth, ID band Patient awake    Reviewed: Allergy & Precautions, NPO status , Patient's Chart, lab work & pertinent test results  Airway Mallampati: II  TM Distance: >3 FB Neck ROM: Full    Dental  (+) Teeth Intact   Pulmonary    breath sounds clear to auscultation       Cardiovascular hypertension,  Rhythm:Regular Rate:Normal     Neuro/Psych    GI/Hepatic   Endo/Other    Renal/GU      Musculoskeletal   Abdominal   Peds  Hematology   Anesthesia Other Findings   Reproductive/Obstetrics                             Anesthesia Physical Anesthesia Plan  ASA: III  Anesthesia Plan: General   Post-op Pain Management:    Induction: Intravenous  PONV Risk Score and Plan:   Airway Management Planned: Oral ETT  Additional Equipment: Arterial line  Intra-op Plan:   Post-operative Plan: Extubation in OR  Informed Consent: I have reviewed the patients History and Physical, chart, labs and discussed the procedure including the risks, benefits and alternatives for the proposed anesthesia with the patient or authorized representative who has indicated his/her understanding and acceptance.       Plan Discussed with: CRNA and Anesthesiologist  Anesthesia Plan Comments:         Anesthesia Quick Evaluation

## 2019-08-07 NOTE — Brief Op Note (Addendum)
08/07/2019  1:48 PM  PATIENT:  Stacy Wagner  79 y.o. female  PRE-OPERATIVE DIAGNOSIS:  Sagittal plane imbalance, scoliosis, degenerative lumbar spinal stenosis, lumbago, radiculopathy   POST-OPERATIVE DIAGNOSIS:  Sagittal plane imbalance, scoliosis, degenerative lumbar spinal stenosis, lumbago, radiculopathy   PROCEDURE:  Procedure(s) with comments: Thoracic Ten to pelvis fixation (N/A) - Thoracic Ten to pelvis fixation APPLICATION OF INTRAOPERATIVE CT SCAN (N/A) with posterolateral T 10 - pelvis  SURGEON:  Surgeon(s) and Role:    Erline Levine, MD - Primary    * Vallarie Mare, MD - Assisting  PHYSICIAN ASSISTANT:   ASSISTANTS: Poteat, RN   Glenford Peers, NP   ANESTHESIA:   general  EBL:  150 mL   BLOOD ADMINISTERED:none  DRAINS: (Medium) Hemovact drain(s) in the subfascial space with  Suction Open   LOCAL MEDICATIONS USED:  MARCAINE    and LIDOCAINE   SPECIMEN:  No Specimen  DISPOSITION OF SPECIMEN:  N/A  COUNTS:  YES  TOURNIQUET:  * No tourniquets in log *  DICTATION: DICTATION: Patient is 79 year old woman with coronal and sagittal deformities with a long history of severe back and bilateral leg pain, right greater than left.  It was elected to take her to surgery for fusion from T 10 through ilium.  This was a planned second stage of scoliosis correction surgery.  The first stage was an ALIF L 5 S 1 and right XLIF L 12, L 23, L 34, L 45 with good correction of spinal deformities both in the coronal and sagittal planes.    Procedure: Patient was placed in a prone position on the [place] table attachment for the Airo intra-op CT/ navigation system after smooth and uncomplicated induction of general endotracheal anesthesia. His lback was shaved and prepped and draped in usual sterile fashion with betadine scrub and DuraPrep. Area of incision was infiltrated with local lidocaine. Incision was made to the lumbodorsal fascia was incised and exposure was performed of the  T 10 to the ilium bilaterally with exposure for S 2 A I screws in the ilium. Intraoperative CT scanwas obtained which confirmed correct orientation with array at T 9.  There was good correction of scoliotic curvature. The posterolateral region was extensively decorticated and pedicle screws were placed at T 10  through ilium bilaterally using intraoperative navigation. S 2 A I screws were placed (8.5 x 70 mm left and 8.5 x 80 mm right).   40 x 6.5 mm pedicle screws were placed at T 10, T 11, T 12 bilaterally and 45 x 5.5 mm screws placed at L 1 and L 2 bilaterally, 6.5 x 45 mm at L 3 and L 4 bilaterally,  7.5 x 40 mm at L 5 and S 1 bilaterally. A second CT spin confirmed that all hardware was well-positioned.  Autograft was harvested from facet joints for use in fusion along with marrow rich blood. Rods were cut, bent and locked down bilaterally with derotation and further correction of remaining coronal deformity.   The posterolateral region was packed with 120 cc allograft and autograft after decorticating the posterolateral region. The wound was irrigated. A medium Hemovac drain was placed. Long-acting Marcaine was injected in the deep musculature.  Fascia was closed with 1 Vicryl sutures skin edges were reapproximated 2 and 3-0 Vicryl sutures. The wound was dressed with Dermabond and an occlusive dressing.  the patient was extubated in the operating room and taken to recovery in stable satisfactory condition she tolerated traction well counts were correct  at the end of the case.  PLAN OF CARE: Admit to inpatient   PATIENT DISPOSITION:  PACU - hemodynamically stable.   Delay start of Pharmacological VTE agent (>24hrs) due to surgical blood loss or risk of bleeding: yes   Pelvic Parameters;  Preop PT 25 PI 42 LL -21 PI-LL +21 SVA +79 Cobb 30  Postop  PI 42 LL -56 PI-LL -14 SVA +59 Cobb 13

## 2019-08-07 NOTE — Op Note (Signed)
08/07/2019  1:48 PM  PATIENT:  Stacy Wagner  79 y.o. female  PRE-OPERATIVE DIAGNOSIS:  Sagittal plane imbalance, scoliosis, degenerative lumbar spinal stenosis, lumbago, radiculopathy   POST-OPERATIVE DIAGNOSIS:  Sagittal plane imbalance, scoliosis, degenerative lumbar spinal stenosis, lumbago, radiculopathy   PROCEDURE:  Procedure(s) with comments: Thoracic Ten to pelvis fixation (N/A) - Thoracic Ten to pelvis fixation APPLICATION OF INTRAOPERATIVE CT SCAN (N/A) with posterolateral T 10 - pelvis  SURGEON:  Surgeon(s) and Role:    Erline Levine, MD - Primary    * Vallarie Mare, MD - Assisting  PHYSICIAN ASSISTANT:   ASSISTANTS: Poteat, RN   Glenford Peers, NP   ANESTHESIA:   general  EBL:  150 mL   BLOOD ADMINISTERED:none  DRAINS: (Medium) Hemovact drain(s) in the subfascial space with  Suction Open   LOCAL MEDICATIONS USED:  MARCAINE    and LIDOCAINE   SPECIMEN:  No Specimen  DISPOSITION OF SPECIMEN:  N/A  COUNTS:  YES  TOURNIQUET:  * No tourniquets in log *  DICTATION: DICTATION: Patient is 79 year old woman with coronal and sagittal deformities with a long history of severe back and bilateral leg pain, right greater than left.  It was elected to take her to surgery for fusion from T 10 through ilium.  This was a planned second stage of scoliosis correction surgery.  The first stage was an ALIF L 5 S 1 and right XLIF L 12, L 23, L 34, L 45 with good correction of spinal deformities both in the coronal and sagittal planes.    Procedure: Patient was placed in a prone position on the [place] table attachment for the Airo intra-op CT/ navigation system after smooth and uncomplicated induction of general endotracheal anesthesia. His lback was shaved and prepped and draped in usual sterile fashion with betadine scrub and DuraPrep. Area of incision was infiltrated with local lidocaine. Incision was made to the lumbodorsal fascia was incised and exposure was performed of the  T 10 to the ilium bilaterally with exposure for S 2 A I screws in the ilium. Intraoperative CT scanwas obtained which confirmed correct orientation with array at T 9.  There was good correction of scoliotic curvature. The posterolateral region was extensively decorticated and pedicle screws were placed at T 10  through ilium bilaterally using intraoperative navigation. S 2 A I screws were placed (8.5 x 70 mm left and 8.5 x 80 mm right).   40 x 6.5 mm pedicle screws were placed at T 10, T 11, T 12 bilaterally and 45 x 5.5 mm screws placed at L 1 and L 2 bilaterally, 6.5 x 45 mm at L 3 and L 4 bilaterally,  7.5 x 40 mm at L 5 and S 1 bilaterally. A second CT spin confirmed that all hardware was well-positioned.  Autograft was harvested from facet joints for use in fusion along with marrow rich blood. Rods were cut, bent and locked down bilaterally with derotation and further correction of remaining coronal deformity.   The posterolateral region was packed with 120 cc allograft and autograft after decorticating the posterolateral region. The wound was irrigated. A medium Hemovac drain was placed. Long-acting Marcaine was injected in the deep musculature.  Fascia was closed with 1 Vicryl sutures skin edges were reapproximated 2 and 3-0 Vicryl sutures. The wound was dressed with Dermabond and an occlusive dressing.  the patient was extubated in the operating room and taken to recovery in stable satisfactory condition she tolerated traction well counts were correct  at the end of the case.  PLAN OF CARE: Admit to inpatient   PATIENT DISPOSITION:  PACU - hemodynamically stable.   Delay start of Pharmacological VTE agent (>24hrs) due to surgical blood loss or risk of bleeding: yes   Pelvic Parameters;  Preop PT 25 PI 42 LL -21 PI-LL +21 SVA +79 Cobb 30  Postop  PI 42 LL -56 PI-LL -14 SVA +59 Cobb 13

## 2019-08-07 NOTE — Transfer of Care (Signed)
Immediate Anesthesia Transfer of Care Note  Patient: Stacy Wagner  Procedure(s) Performed: Thoracic Ten to pelvis fixation (N/A Back) APPLICATION OF INTRAOPERATIVE CT SCAN (N/A )  Patient Location: PACU  Anesthesia Type:General  Level of Consciousness: awake, patient cooperative and responds to stimulation  Airway & Oxygen Therapy: Patient Spontanous Breathing and Patient connected to face mask oxygen  Post-op Assessment: Report given to RN and Post -op Vital signs reviewed and stable  Post vital signs: Reviewed and stable  Last Vitals:  Vitals Value Taken Time  BP 135/65 08/07/19 1404  Temp 36.2 C 08/07/19 1404  Pulse 79 08/07/19 1406  Resp 12 08/07/19 1406  SpO2 100 % 08/07/19 1406  Vitals shown include unvalidated device data.  Last Pain:  Vitals:   08/07/19 0515  TempSrc: Oral  PainSc:       Patients Stated Pain Goal: 0 (42/70/62 3762)  Complications: No complications documented.

## 2019-08-07 NOTE — Interval H&P Note (Signed)
History and Physical Interval Note:  08/07/2019 7:24 AM  Stacy Wagner  has presented today for surgery, with the diagnosis of Sagittal plane imbalance.  The various methods of treatment have been discussed with the patient and family. After consideration of risks, benefits and other options for treatment, the patient has consented to  Procedure(s) with comments: Thoracic 10 to pelvis fixation (N/A) - stage 2 APPLICATION OF INTRAOPERATIVE CT SCAN (N/A) as a surgical intervention.  The patient's history has been reviewed, patient examined, no change in status, stable for surgery.  I have reviewed the patient's chart and labs.  Questions were answered to the patient's satisfaction.     [name]

## 2019-08-07 NOTE — Anesthesia Postprocedure Evaluation (Signed)
Anesthesia Post Note  Patient: Stacy Wagner  Procedure(s) Performed: Thoracic Ten to pelvis fixation (N/A Back) APPLICATION OF INTRAOPERATIVE CT SCAN (N/A )     Patient location during evaluation: PACU Anesthesia Type: General Level of consciousness: awake and alert Pain management: pain level controlled Vital Signs Assessment: post-procedure vital signs reviewed and stable Respiratory status: spontaneous breathing, nonlabored ventilation, respiratory function stable and patient connected to nasal cannula oxygen Cardiovascular status: blood pressure returned to baseline and stable Postop Assessment: no apparent nausea or vomiting Anesthetic complications: no   No complications documented.  Last Vitals:  Vitals:   08/07/19 1545 08/07/19 1602  BP: (!) 109/54 (!) 115/52  Pulse: 92 91  Resp: 17 16  Temp:  (!) 36.4 C  SpO2: 100% 94%    Last Pain:  Vitals:   08/07/19 1732  TempSrc:   PainSc: 7                  [name]

## 2019-08-08 MED ORDER — ENOXAPARIN SODIUM 40 MG/0.4ML ~~LOC~~ SOLN
40.0000 mg | SUBCUTANEOUS | Status: DC
  Administered 2019-08-08 – 2019-08-10 (×3): 40 mg via SUBCUTANEOUS
  Filled 2019-08-08 (×3): qty 0.4

## 2019-08-08 NOTE — Evaluation (Signed)
Occupational Therapy Re-Evaluation Patient Details Name: Stacy Wagner MRN: [id number] DOB: [birth year] Today's Date: 08/08/2019    History of Present Illness Pt is a 79 yo female admitted long history of back pain, scoliosis and previous back surgeries. Pt underwent a L4-L5 ALIF and L1-L5 R XLIF 08/04/19 and  T10-pelvis fixation 08/07/19.   Clinical Impression   Pt seen in conjunction with PT for re-evaluation after T-10- pelvis fixation.  She presents with increased pain, decreased activity tolerance, generalized weakness, impaired balance.  She currently requires set up to max A for ADLs, and mod A +2 for functional transfers.  She was mod I with ADLs PTA, and has good family support at home.  Recommend CIR level rehab at discharge.     Follow Up Recommendations  CIR;Supervision/Assistance - 24 hour    Equipment Recommendations  3 in 1 bedside commode    Recommendations for Other Services Rehab consult     Precautions / Restrictions Precautions Precautions: Back;Fall Precaution Booklet Issued: Yes (comment) Precaution Comments: reviewed 3/3 back precautions Required Braces or Orthoses: Spinal Brace Spinal Brace: Thoracolumbosacral orthotic;Applied in sitting position      Mobility Bed Mobility Overal bed mobility: Needs Assistance Bed Mobility: Rolling;Sidelying to Sit Rolling: Mod assist Sidelying to sit: +2 for physical assistance;Mod assist       General bed mobility comments: increased time, cues for sequencing, +rail  Transfers Overall transfer level: Needs assistance Equipment used: Rolling walker (2 wheeled) Transfers: Sit to/from [employer] Transfers Sit to Stand: +2 physical assistance;+2 safety/equipment;Mod assist Stand pivot transfers: Mod assist;+2 physical assistance;+2 safety/equipment       General transfer comment: assist to power up and stabilize balance; increased time to attain full, upright stance. Pt able to take small, pivot steps  with RW for bed to recliner transfer. Bilat knee instability. No buckling noted.    Balance Overall balance assessment: Needs assistance Sitting-balance support: Feet supported;Bilateral upper extremity supported Sitting balance-Leahy Scale: Fair     Standing balance support: Bilateral upper extremity supported;During functional activity Standing balance-Leahy Scale: Poor Standing balance comment: reliant on external support                           ADL either performed or assessed with clinical judgement   ADL Overall ADL's : Needs assistance/impaired Eating/Feeding: Set up;Sitting   Grooming: Wash/dry hands;Wash/dry face;Oral care;Supervision/safety;Sitting   Upper Body Bathing: Minimal assistance;Sitting;Cueing for compensatory techniques   Lower Body Bathing: Maximal assistance;Sit to/from stand;Cueing for back precautions;Cueing for compensatory techniques   Upper Body Dressing : Moderate assistance;Sitting   Lower Body Dressing: Maximal assistance;Sit to/from stand   Toilet Transfer: Moderate assistance;+2 for safety/equipment;+2 for physical assistance;Stand-pivot;Comfort height toilet;RW;BSC   Toileting- Clothing Manipulation and Hygiene: Maximal assistance;Sit to/from stand;Cueing for back precautions;Cueing for compensatory techniques       Functional mobility during ADLs: Moderate assistance;+2 for physical assistance;+2 for safety/equipment;Rolling walker       Vision         Perception     Praxis      Pertinent Vitals/Pain Pain Assessment: Faces Faces Pain Scale: Hurts even more Pain Location: back Pain Descriptors / Indicators: Grimacing;Guarding;Discomfort;Sore Pain Intervention(s): Repositioned;Limited activity within patient's tolerance     Hand Dominance Right   Extremity/Trunk Assessment Upper Extremity Assessment Upper Extremity Assessment: Overall WFL for tasks assessed   Lower Extremity Assessment Lower Extremity  Assessment: Defer to PT evaluation       Communication Communication Communication: [facility]  Cognition Arousal/Alertness: Awake/alert Behavior During Therapy: WFL for tasks assessed/performed;Anxious Overall Cognitive Status: Within Functional Limits for tasks assessed                                 General Comments: mildly confusion, most likely relating to meds   General Comments  VSS.  Family present     Exercises     Shoulder Instructions      Home Living Family/patient expects to be discharged to:: Private residence Living Arrangements: Spouse/significant other Available Help at Discharge: Family;Available 24 hours/day Type of Home: House Home Access: Stairs to enter [employer] of Steps: 8 in back, 4 in front no rails Entrance Stairs-Rails: Can reach both Home Layout: One level     Bathroom Shower/Tub: Walk-in shower;Door   [employer] Toilet: Standard     Home Equipment: Kasandra Knudsen - single point;Shower seat - built in   Additional Comments: pt may have access to [facility] and walker.      Prior Functioning/Environment Level of Independence: Independent with assistive device(s)        Comments: walked with cane at times when back bothered her        OT Problem List: Decreased strength;Decreased activity tolerance;Impaired balance (sitting and/or standing);Decreased knowledge of use of DME or AE;Decreased knowledge of precautions;Pain;Decreased safety awareness      OT Treatment/Interventions: Self-care/ADL training;DME and/or AE instruction;Therapeutic activities;Balance training;Patient/family education    OT Goals(Current goals can be found in the care plan section) Acute Rehab OT Goals Patient Stated Goal: to get stronger  OT Goal Formulation: With patient Time For Goal Achievement: 08/22/19 Potential to Achieve Goals: Good  OT Frequency: Min 2X/week   Barriers to D/C:    daughter is in town from [facility] and will provide assist to  pt at discharge as long as needed        Co-evaluation PT/OT/SLP Co-Evaluation/Treatment: Yes Reason for Co-Treatment: Complexity of the patient's impairments (multi-system involvement);For patient/therapist safety;To address functional/ADL transfers PT goals addressed during session: Mobility/safety with mobility;Balance;Proper use of DME OT goals addressed during session: ADL's and self-care      AM-PAC OT "6 Clicks" Daily Activity     Outcome Measure Help from another person eating meals?: None Help from another person taking care of personal grooming?: A Little Help from another person toileting, which includes using toliet, bedpan, or urinal?: A Lot Help from another person bathing (including washing, rinsing, drying)?: A Lot Help from another person to put on and taking off regular upper body clothing?: A Lot Help from another person to put on and taking off regular lower body clothing?: A Lot 6 Click Score: 15   End of Session Equipment Utilized During Treatment: Gait belt;Rolling walker Nurse Communication: Mobility status;Need for lift equipment  Activity Tolerance: Patient tolerated treatment well Patient left: in chair;with call bell/phone within reach;with family/visitor present  OT Visit Diagnosis: Unsteadiness on feet (R26.81);Other abnormalities of gait and mobility (R26.89);Pain Pain - part of body: Hip (back and hip )                Time: 9794-8016 OT Time Calculation (min): 30 min Charges:  OT General Charges $OT Visit: 1 Visit OT Evaluation $OT Re-eval: 1 Re-eval  Nilsa Nutting., OTR/L Acute Rehabilitation Services Pager [phone] Office [address], [place] 08/08/2019, 1:50 PM

## 2019-08-08 NOTE — Progress Notes (Signed)
Subjective: Patient reports pain controlled.  Objective: Vital signs in last 24 hours: Temp:  [97.1 F (36.2 C)-98.5 F (36.9 C)] 97.6 F (36.4 C) (06/12 1111) Pulse Rate:  [61-104] 101 (06/12 1111) Resp:  [10-22] 18 (06/12 1111) BP: (105-135)/(42-66) 119/56 (06/12 0745) SpO2:  [93 %-100 %] 100 % (06/12 1111)  Intake/Output from previous day: 06/11 0701 - 06/12 0700 In: 1820 [I.V.:1400; IV Piggyback:420] Out: 0370 [Urine:1050; Drains:285; Blood:150] Intake/Output this shift: No intake/output data recorded.  Sitting in chair, TLSO brace on.  Incisions c/d/i.  Lab Results: No results for input(s): WBC, HGB, HCT, PLT in the last 72 hours. BMET No results for input(s): NA, K, CL, CO2, GLUCOSE, BUN, CREATININE, CALCIUM in the last 72 hours.  Studies/Results: No results found.  Assessment/Plan: S/p T10-Iliac fusion and LIFs L1-S1, doing well - cont PT/OT with brace - continue Hemovac drain for now - Lovenox for DVT prophylaxis    Vallarie Mare 08/08/2019, 12:33 PM

## 2019-08-08 NOTE — Progress Notes (Signed)
Inpatient Rehab Admissions Coordinator Note:   Per therapy recommendations, pt was screened for CIR candidacy by Clemens Catholic, [place] CCC-SLP. At this time, Pt. Appears to have functional decline and is a good candidate for CIR. Will request rehab consult order. Please contact me with questions.   Clemens Catholic, [place], [place] Admissions Coordinator  [phone] ([place]) [phone] (office)

## 2019-08-08 NOTE — Plan of Care (Signed)

## 2019-08-08 NOTE — Progress Notes (Signed)
Physical Therapy Re-Evaluation and Treatment Patient Details Name: Stacy Wagner MRN: [id number] DOB: Jan 28, 1941 Today's Date: 08/08/2019    History of Present Illness Pt is a 79 yo female admitted long history of back pain, scoliosis and previous back surgeries. Pt underwent a L4-L5 ALIF and L1-L5 R XLIF 08/04/19 and  T10-pelvis fixation 08/07/19.    PT Comments    PT re-eval completed following 2nd surgery, T10-pelvis fixation, 08/07/19. Pt required +2 mod assist bed mobility, +2 mod assist sit to stand with RW, and +2 mod assist SPT with RW bed to recliner. Increased time required to complete all mobility skills. Pt initially very fearful of pain but very cooperative and willing to participate in session. Pt in recliner eating lunch at end of session. Discharge recommendation updated to CIR.   Follow Up Recommendations  CIR     Equipment Recommendations  Rolling walker with 5" wheels    Recommendations for Other Services Rehab consult     Precautions / Restrictions Precautions Precautions: Back;Fall Precaution Comments: reviewed 3/3 back precautions Required Braces or Orthoses: Spinal Brace Spinal Brace: Thoracolumbosacral orthotic;Applied in sitting position    Mobility  Bed Mobility Overal bed mobility: Needs Assistance Bed Mobility: Rolling;Sidelying to Sit Rolling: Mod assist Sidelying to sit: +2 for physical assistance;Mod assist       General bed mobility comments: increased time, cues for sequencing, +rail  Transfers Overall transfer level: Needs assistance Equipment used: Rolling walker (2 wheeled) Transfers: Sit to/from [employer] Sit to Stand: Mod assist;+2 physical assistance Stand pivot transfers: Mod assist;+2 physical assistance       General transfer comment: assist to power up and stabilize balance; increased time to attain full, upright stance. Pt able to take small, pivot steps with RW for bed to recliner transfer. Bilat knee  instability. No buckling noted.  Ambulation/Gait                 Stairs             Wheelchair Mobility    Modified Rankin (Stroke Patients Only)       Balance Overall balance assessment: Needs assistance Sitting-balance support: Feet supported;Bilateral upper extremity supported Sitting balance-Leahy Scale: Fair     Standing balance support: Bilateral upper extremity supported;During functional activity Standing balance-Leahy Scale: Poor Standing balance comment: reliant on external support                            Cognition Arousal/Alertness: Awake/alert Behavior During Therapy: WFL for tasks assessed/performed Overall Cognitive Status: Within Functional Limits for tasks assessed                                 General Comments: mildly confusion, most likely relating to meds      Exercises      General Comments General comments (skin integrity, edema, etc.): VSS      Pertinent Vitals/Pain Pain Assessment: Faces Faces Pain Scale: Hurts even more Pain Location: back Pain Descriptors / Indicators: Grimacing;Guarding;Discomfort;Sore Pain Intervention(s): Repositioned;Limited activity within patient's tolerance;Monitored during session    Home Living                      Prior Function            PT Goals (current goals can now be found in the care plan section) Acute Rehab PT Goals Patient Stated  Goal: to go home with less pain PT Goal Formulation: With patient Time For Goal Achievement: 08/19/19 Potential to Achieve Goals: Good Progress towards PT goals: Progressing toward goals    Frequency    Min 5X/week      PT Plan Discharge plan needs to be updated    Co-evaluation PT/OT/SLP Co-Evaluation/Treatment: Yes Reason for Co-Treatment: Complexity of the patient's impairments (multi-system involvement);For patient/therapist safety;To address functional/ADL transfers PT goals addressed during  session: Mobility/safety with mobility;Balance;Proper use of DME        AM-PAC PT "6 Clicks" Mobility   Outcome Measure  Help needed turning from your back to your side while in a flat bed without using bedrails?: A Lot Help needed moving from lying on your back to sitting on the side of a flat bed without using bedrails?: A Lot Help needed moving to and from a bed to a chair (including a wheelchair)?: A Lot Help needed standing up from a chair using your arms (e.g., wheelchair or bedside chair)?: A Lot Help needed to walk in hospital room?: A Lot Help needed climbing 3-5 steps with a railing? : Total 6 Click Score: 11    End of Session Equipment Utilized During Treatment: Gait belt;Back brace Activity Tolerance: Patient tolerated treatment well Patient left: in chair;with call bell/phone within reach;with family/visitor present;with chair alarm set Nurse Communication: Mobility status;Need for lift equipment (recommend stedy for return to bed) PT Visit Diagnosis: Difficulty in walking, not elsewhere classified (R26.2);Pain;Unsteadiness on feet (R26.81)     Time: 1100-1131 PT Time Calculation (min) (ACUTE ONLY): 31 min  Charges:  $Therapeutic Activity: 8-22 mins                     Lorrin Goodell, PT  Office # [phone] Pager [phone]    Lorriane Shire 08/08/2019, 12:55 PM

## 2019-08-09 NOTE — Progress Notes (Addendum)
Inpatient Rehab Admissions Coordinator:   Met with patient at bedside to discuss potential CIR admission. Pt. Stated interest. Her husband and daughter were present and confirmed that they can provide 24/7 support at discharge. Will pursue for potential admit next week, pending bed availability.  Clemens Catholic, [place], [place] Admissions Coordinator  [phone] ([place]) [phone] (office)

## 2019-08-09 NOTE — Progress Notes (Signed)
Subjective: Patient reports sciatic pain in right leg, similar in character to preop  Objective: Vital signs in last 24 hours: Temp:  [97.7 F (36.5 C)-98.1 F (36.7 C)] 98.1 F (36.7 C) (06/13 1213) Pulse Rate:  [78-105] 98 (06/13 1213) Resp:  [14-20] 20 (06/13 1213) BP: (107-134)/(47-77) 118/77 (06/13 1213) SpO2:  [95 %-100 %] 100 % (06/13 1213)  Intake/Output from previous day: 06/12 0701 - 06/13 0700 In: -  Out: 1000 [Urine:700; Drains:300] Intake/Output this shift: No intake/output data recorded.  Abdominal and flank incision dressings removed.  Incisions c/d/i Back incision c/d Drain output 330 ml, serosanguinous  Lab Results: No results for input(s): WBC, HGB, HCT, PLT in the last 72 hours. BMET No results for input(s): NA, K, CL, CO2, GLUCOSE, BUN, CREATININE, CALCIUM in the last 72 hours.  Studies/Results: No results found.  Assessment/Plan: 79 yo F s/p T10-iliac fusion, doing well - will continue drain given high output (330 ml over 24 hours) - likely rehab for disposition  Stacy Wagner 08/09/2019, 2:24 PM

## 2019-08-09 NOTE — Progress Notes (Signed)
Physical Therapy Treatment Patient Details Name: Stacy Wagner MRN: [id number] DOB: Jul 21, 1940 Today's Date: 08/09/2019    History of Present Illness Pt is a 79 yo female admitted long history of back pain, scoliosis and previous back surgeries. Pt underwent a L4-L5 ALIF and L1-L5 R XLIF 08/04/19 and  T10-pelvis fixation 08/07/19.    PT Comments    Pt received in bed, reporting increased pain RLE. She required +2 max assist bed mobility, +2 mod assist sit to stand, and +2 mod assist ambulation 10' with RW. R knee buckling noted during gait. Pt in recliner at end of session. Family present in room. Spoke to RN regarding use of stedy for return to bed.     Follow Up Recommendations  CIR     Equipment Recommendations  Rolling walker with 5" wheels    Recommendations for Other Services Rehab consult     Precautions / Restrictions Precautions Precautions: Back;Fall Precaution Comments: reviewed 3/3 back precautions Required Braces or Orthoses: Spinal Brace Spinal Brace: Thoracolumbosacral orthotic;Applied in sitting position    Mobility  Bed Mobility Overal bed mobility: Needs Assistance Bed Mobility: Rolling;Sidelying to Sit Rolling: Max assist Sidelying to sit: Max assist;+2 for physical assistance       General bed mobility comments: increased time, cues for sequencing, +rail  Transfers Overall transfer level: Needs assistance Equipment used: Rolling walker (2 wheeled) Transfers: Sit to/from Stand Sit to Stand: +2 physical assistance;Mod assist;+2 safety/equipment         General transfer comment: cues for hand placement, assist to power up, increased time to attain upright stance  Ambulation/Gait Ambulation/Gait assistance: +2 physical assistance;+2 safety/equipment;Mod assist Gait Distance (Feet): 10 Feet Assistive device: Rolling walker (2 wheeled) Gait Pattern/deviations: Step-through pattern;Decreased stride length Gait velocity: very slow Gait velocity  interpretation: <1.8 ft/sec, indicate of risk for recurrent falls General Gait Details: +2 mod assist for balance, RW management and chair follow. Assist to progress RLE through swing phase and block knee during stance phase.   Stairs             Wheelchair Mobility    Modified Rankin (Stroke Patients Only)       Balance Overall balance assessment: Needs assistance Sitting-balance support: Feet supported;Bilateral upper extremity supported Sitting balance-Leahy Scale: Fair     Standing balance support: Bilateral upper extremity supported;During functional activity Standing balance-Leahy Scale: Poor Standing balance comment: reliant on external support                            Cognition Arousal/Alertness: Awake/alert Behavior During Therapy: WFL for tasks assessed/performed;Anxious Overall Cognitive Status: Within Functional Limits for tasks assessed                                        Exercises      General Comments General comments (skin integrity, edema, etc.): VSS. Family present      Pertinent Vitals/Pain Pain Assessment: Faces Faces Pain Scale: Hurts whole lot Pain Location: back Pain Descriptors / Indicators: Grimacing;Guarding;Discomfort;Sore Pain Intervention(s): Repositioned;Limited activity within patient's tolerance;Monitored during session;Patient requesting pain meds-RN notified    Home Living                      Prior Function            PT Goals (current goals can now be found in  the care plan section) Acute Rehab PT Goals Patient Stated Goal: to get stronger  Progress towards PT goals: Progressing toward goals    Frequency    Min 5X/week      PT Plan Current plan remains appropriate    Co-evaluation              AM-PAC PT "6 Clicks" Mobility   Outcome Measure  Help needed turning from your back to your side while in a flat bed without using bedrails?: A Lot Help needed moving  from lying on your back to sitting on the side of a flat bed without using bedrails?: A Lot Help needed moving to and from a bed to a chair (including a wheelchair)?: A Lot Help needed standing up from a chair using your arms (e.g., wheelchair or bedside chair)?: A Lot Help needed to walk in hospital room?: A Lot Help needed climbing 3-5 steps with a railing? : Total 6 Click Score: 11    End of Session Equipment Utilized During Treatment: Gait belt;Back brace Activity Tolerance: Patient tolerated treatment well Patient left: in chair;with call bell/phone within reach;with family/visitor present Nurse Communication: Mobility status;Need for lift equipment (stedy for return to bed) PT Visit Diagnosis: Difficulty in walking, not elsewhere classified (R26.2);Pain;Unsteadiness on feet (R26.81)     Time: 9163-8466 PT Time Calculation (min) (ACUTE ONLY): 25 min  Charges:  $Gait Training: 23-37 mins                     Lorrin Goodell, [place]  Office # [phone] Pager [phone]    Lorriane Shire 08/09/2019, 2:36 PM

## 2019-08-10 ENCOUNTER — Inpatient Hospital Stay (HOSPITAL_COMMUNITY)
Admission: RE | Admit: 2019-08-10 | Discharge: 2019-08-26 | DRG: 092 | Disposition: A | Discharge status: Home Health | Payer: Medicare Other | Source: Intra-hospital | Attending: Physical Medicine and Rehabilitation | Admitting: Physical Medicine and Rehabilitation

## 2019-08-10 ENCOUNTER — Other Ambulatory Visit: Payer: Self-pay

## 2019-08-10 ENCOUNTER — Encounter (HOSPITAL_COMMUNITY): Payer: Self-pay | Admitting: Physical Medicine and Rehabilitation

## 2019-08-10 DIAGNOSIS — G629 Polyneuropathy, unspecified: Secondary | ICD-10-CM | POA: Diagnosis present

## 2019-08-10 DIAGNOSIS — E039 Hypothyroidism, unspecified: Secondary | ICD-10-CM | POA: Diagnosis present

## 2019-08-10 DIAGNOSIS — F419 Anxiety disorder, unspecified: Secondary | ICD-10-CM | POA: Diagnosis present

## 2019-08-10 DIAGNOSIS — M4125 Other idiopathic scoliosis, thoracolumbar region: Principal | ICD-10-CM

## 2019-08-10 DIAGNOSIS — E876 Hypokalemia: Secondary | ICD-10-CM | POA: Diagnosis not present

## 2019-08-10 DIAGNOSIS — B370 Candidal stomatitis: Secondary | ICD-10-CM | POA: Diagnosis present

## 2019-08-10 DIAGNOSIS — K219 Gastro-esophageal reflux disease without esophagitis: Secondary | ICD-10-CM | POA: Diagnosis present

## 2019-08-10 DIAGNOSIS — D72829 Elevated white blood cell count, unspecified: Secondary | ICD-10-CM

## 2019-08-10 DIAGNOSIS — R208 Other disturbances of skin sensation: Secondary | ICD-10-CM

## 2019-08-10 DIAGNOSIS — M79604 Pain in right leg: Secondary | ICD-10-CM | POA: Diagnosis not present

## 2019-08-10 DIAGNOSIS — Z85828 Personal history of other malignant neoplasm of skin: Secondary | ICD-10-CM | POA: Diagnosis not present

## 2019-08-10 DIAGNOSIS — I10 Essential (primary) hypertension: Secondary | ICD-10-CM | POA: Diagnosis present

## 2019-08-10 DIAGNOSIS — Z90710 Acquired absence of both cervix and uterus: Secondary | ICD-10-CM | POA: Diagnosis not present

## 2019-08-10 DIAGNOSIS — D62 Acute posthemorrhagic anemia: Secondary | ICD-10-CM | POA: Diagnosis present

## 2019-08-10 DIAGNOSIS — M4126 Other idiopathic scoliosis, lumbar region: Secondary | ICD-10-CM | POA: Diagnosis present

## 2019-08-10 DIAGNOSIS — Z981 Arthrodesis status: Secondary | ICD-10-CM | POA: Diagnosis not present

## 2019-08-10 DIAGNOSIS — K592 Neurogenic bowel, not elsewhere classified: Secondary | ICD-10-CM | POA: Diagnosis present

## 2019-08-10 DIAGNOSIS — R2689 Other abnormalities of gait and mobility: Secondary | ICD-10-CM | POA: Diagnosis present

## 2019-08-10 DIAGNOSIS — H9190 Unspecified hearing loss, unspecified ear: Secondary | ICD-10-CM | POA: Diagnosis present

## 2019-08-10 DIAGNOSIS — M4127 Other idiopathic scoliosis, lumbosacral region: Secondary | ICD-10-CM | POA: Diagnosis not present

## 2019-08-10 DIAGNOSIS — Z79890 Hormone replacement therapy (postmenopausal): Secondary | ICD-10-CM

## 2019-08-10 DIAGNOSIS — Z85810 Personal history of malignant neoplasm of tongue: Secondary | ICD-10-CM | POA: Diagnosis not present

## 2019-08-10 DIAGNOSIS — Z79899 Other long term (current) drug therapy: Secondary | ICD-10-CM

## 2019-08-10 DIAGNOSIS — E871 Hypo-osmolality and hyponatremia: Secondary | ICD-10-CM | POA: Diagnosis not present

## 2019-08-10 DIAGNOSIS — G4700 Insomnia, unspecified: Secondary | ICD-10-CM

## 2019-08-10 DIAGNOSIS — Z885 Allergy status to narcotic agent status: Secondary | ICD-10-CM | POA: Diagnosis not present

## 2019-08-10 DIAGNOSIS — M62838 Other muscle spasm: Secondary | ICD-10-CM | POA: Diagnosis not present

## 2019-08-10 DIAGNOSIS — K5900 Constipation, unspecified: Secondary | ICD-10-CM | POA: Diagnosis present

## 2019-08-10 DIAGNOSIS — D72828 Other elevated white blood cell count: Secondary | ICD-10-CM | POA: Diagnosis not present

## 2019-08-10 DIAGNOSIS — M419 Scoliosis, unspecified: Secondary | ICD-10-CM | POA: Diagnosis present

## 2019-08-10 DIAGNOSIS — T380X5A Adverse effect of glucocorticoids and synthetic analogues, initial encounter: Secondary | ICD-10-CM | POA: Diagnosis not present

## 2019-08-10 DIAGNOSIS — Z79891 Long term (current) use of opiate analgesic: Secondary | ICD-10-CM

## 2019-08-10 MED ORDER — GABAPENTIN 400 MG PO CAPS
400.0000 mg | ORAL_CAPSULE | Freq: Three times a day (TID) | ORAL | Status: DC
  Administered 2019-08-10 (×2): 400 mg via ORAL
  Filled 2019-08-10 (×2): qty 1

## 2019-08-10 MED ORDER — DIPHENHYDRAMINE HCL 12.5 MG/5ML PO ELIX: 12.5000 mg | ORAL_SOLUTION | Freq: Four times a day (QID) | ORAL | Status: DC | PRN

## 2019-08-10 MED ORDER — LEVOTHYROXINE SODIUM 75 MCG PO TABS
75.0000 ug | ORAL_TABLET | Freq: Every day | ORAL | Status: DC
  Administered 2019-08-11 – 2019-08-26 (×16): 75 ug via ORAL
  Filled 2019-08-10 (×16): qty 1

## 2019-08-10 MED ORDER — TRIAMTERENE-HCTZ 75-50 MG PO TABS
1.0000 | ORAL_TABLET | Freq: Every day | ORAL | Status: DC
  Administered 2019-08-11 – 2019-08-26 (×16): 1 via ORAL
  Filled 2019-08-10 (×16): qty 1

## 2019-08-10 MED ORDER — NYSTATIN 100000 UNIT/ML MT SUSP
5.0000 mL | Freq: Four times a day (QID) | OROMUCOSAL | Status: DC
  Administered 2019-08-10 – 2019-08-26 (×62): 500000 [IU] via ORAL
  Filled 2019-08-10 (×63): qty 5

## 2019-08-10 MED ORDER — POLYETHYLENE GLYCOL 3350 17 G PO PACK
17.0000 g | PACK | Freq: Every day | ORAL | Status: DC | PRN
  Administered 2019-08-12 – 2019-08-14 (×2): 17 g via ORAL
  Filled 2019-08-10 (×3): qty 1

## 2019-08-10 MED ORDER — POLYETHYLENE GLYCOL 3350 17 G PO PACK: 17.0000 g | PACK | Freq: Every day | ORAL | Status: DC | PRN

## 2019-08-10 MED ORDER — FLEET ENEMA 7-19 GM/118ML RE ENEM
1.0000 | ENEMA | Freq: Once | RECTAL | Status: AC | PRN
  Administered 2019-08-14: 1 via RECTAL
  Filled 2019-08-10 (×2): qty 1

## 2019-08-10 MED ORDER — TRAMADOL HCL 50 MG PO TABS
50.0000 mg | ORAL_TABLET | Freq: Four times a day (QID) | ORAL | Status: DC | PRN
  Administered 2019-08-11 – 2019-08-13 (×3): 50 mg via ORAL
  Filled 2019-08-10 (×4): qty 1

## 2019-08-10 MED ORDER — DEXAMETHASONE SODIUM PHOSPHATE 4 MG/ML IJ SOLN: 4.0000 mg | INTRAMUSCULAR | Status: DC

## 2019-08-10 MED ORDER — ACETAMINOPHEN 325 MG PO TABS
325.0000 mg | ORAL_TABLET | ORAL | Status: DC | PRN
  Administered 2019-08-14: 650 mg via ORAL
  Filled 2019-08-10: qty 2

## 2019-08-10 MED ORDER — OXYCODONE HCL 5 MG PO TABS
5.0000 mg | ORAL_TABLET | ORAL | Status: DC | PRN
  Administered 2019-08-10 – 2019-08-17 (×31): 10 mg via ORAL
  Administered 2019-08-17: 5 mg via ORAL
  Administered 2019-08-17 – 2019-08-19 (×10): 10 mg via ORAL
  Administered 2019-08-19: 5 mg via ORAL
  Administered 2019-08-19 – 2019-08-26 (×16): 10 mg via ORAL
  Filled 2019-08-10 (×17): qty 2
  Filled 2019-08-10: qty 1
  Filled 2019-08-10 (×42): qty 2

## 2019-08-10 MED ORDER — BISACODYL 10 MG RE SUPP
10.0000 mg | Freq: Every day | RECTAL | Status: DC
  Administered 2019-08-10 – 2019-08-16 (×6): 10 mg via RECTAL
  Filled 2019-08-10 (×7): qty 1

## 2019-08-10 MED ORDER — METHOCARBAMOL 750 MG PO TABS
750.0000 mg | ORAL_TABLET | Freq: Four times a day (QID) | ORAL | Status: DC | PRN
  Administered 2019-08-10 – 2019-08-26 (×30): 750 mg via ORAL
  Filled 2019-08-10 (×31): qty 1

## 2019-08-10 MED ORDER — DEXAMETHASONE SODIUM PHOSPHATE 4 MG/ML IJ SOLN
4.0000 mg | Freq: Two times a day (BID) | INTRAMUSCULAR | Status: DC
  Administered 2019-08-11: 4 mg via INTRAVENOUS
  Filled 2019-08-10 (×2): qty 1

## 2019-08-10 MED ORDER — PNEUMOCOCCAL 13-VAL CONJ VACC IM SUSP
0.5000 mL | INTRAMUSCULAR | Status: DC
  Filled 2019-08-10: qty 0.5

## 2019-08-10 MED ORDER — TRAZODONE HCL 50 MG PO TABS
25.0000 mg | ORAL_TABLET | Freq: Every evening | ORAL | Status: DC | PRN
  Administered 2019-08-11 – 2019-08-21 (×8): 50 mg via ORAL
  Filled 2019-08-10 (×8): qty 1

## 2019-08-10 MED ORDER — BISACODYL 10 MG RE SUPP: 10.0000 mg | Freq: Every day | RECTAL | Status: DC | PRN

## 2019-08-10 MED ORDER — DEXAMETHASONE SODIUM PHOSPHATE 4 MG/ML IJ SOLN
4.0000 mg | Freq: Three times a day (TID) | INTRAMUSCULAR | Status: AC
  Administered 2019-08-10 (×2): 4 mg via INTRAVENOUS
  Filled 2019-08-10 (×2): qty 1

## 2019-08-10 MED ORDER — DEXAMETHASONE SODIUM PHOSPHATE 4 MG/ML IJ SOLN: 4.0000 mg | Freq: Three times a day (TID) | INTRAMUSCULAR | Status: DC

## 2019-08-10 MED ORDER — PNEUMOCOCCAL VAC POLYVALENT 25 MCG/0.5ML IJ INJ: 0.5000 mL | INJECTION | INTRAMUSCULAR | Status: DC

## 2019-08-10 MED ORDER — PROCHLORPERAZINE EDISYLATE 10 MG/2ML IJ SOLN: 5.0000 mg | Freq: Four times a day (QID) | INTRAMUSCULAR | Status: DC | PRN

## 2019-08-10 MED ORDER — VITAMIN D 25 MCG (1000 UNIT) PO TABS
1000.0000 [IU] | ORAL_TABLET | Freq: Every day | ORAL | Status: DC
  Administered 2019-08-11 – 2019-08-26 (×16): 1000 [IU] via ORAL
  Filled 2019-08-10 (×16): qty 1

## 2019-08-10 MED ORDER — PROCHLORPERAZINE MALEATE 5 MG PO TABS: 5.0000 mg | ORAL_TABLET | Freq: Four times a day (QID) | ORAL | Status: DC | PRN

## 2019-08-10 MED ORDER — GUAIFENESIN-DM 100-10 MG/5ML PO SYRP: 5.0000 mL | ORAL_SOLUTION | Freq: Four times a day (QID) | ORAL | Status: DC | PRN

## 2019-08-10 MED ORDER — GABAPENTIN 400 MG PO CAPS
400.0000 mg | ORAL_CAPSULE | Freq: Three times a day (TID) | ORAL | Status: DC
  Administered 2019-08-10 – 2019-08-17 (×20): 400 mg via ORAL
  Filled 2019-08-10 (×20): qty 1

## 2019-08-10 MED ORDER — LIDOCAINE HCL URETHRAL/MUCOSAL 2 % EX GEL: CUTANEOUS | Status: DC | PRN

## 2019-08-10 MED ORDER — PROCHLORPERAZINE 25 MG RE SUPP: 12.5000 mg | Freq: Four times a day (QID) | RECTAL | Status: DC | PRN

## 2019-08-10 MED ORDER — ADULT MULTIVITAMIN W/MINERALS CH
1.0000 | ORAL_TABLET | Freq: Every day | ORAL | Status: DC
  Administered 2019-08-11 – 2019-08-26 (×16): 1 via ORAL
  Filled 2019-08-10 (×16): qty 1

## 2019-08-10 MED ORDER — ENOXAPARIN SODIUM 40 MG/0.4ML ~~LOC~~ SOLN
40.0000 mg | SUBCUTANEOUS | Status: DC
  Administered 2019-08-11 – 2019-08-13 (×3): 40 mg via SUBCUTANEOUS
  Filled 2019-08-10 (×4): qty 0.4

## 2019-08-10 MED ORDER — PANTOPRAZOLE SODIUM 40 MG PO TBEC
40.0000 mg | DELAYED_RELEASE_TABLET | Freq: Every day | ORAL | Status: DC
  Administered 2019-08-11 – 2019-08-26 (×16): 40 mg via ORAL
  Filled 2019-08-10 (×16): qty 1

## 2019-08-10 MED ORDER — ALUM & MAG HYDROXIDE-SIMETH 200-200-20 MG/5ML PO SUSP: 30.0000 mL | ORAL | Status: DC | PRN

## 2019-08-10 MED ORDER — ASCORBIC ACID 500 MG PO TABS
500.0000 mg | ORAL_TABLET | Freq: Every day | ORAL | Status: DC
  Administered 2019-08-11 – 2019-08-26 (×16): 500 mg via ORAL
  Filled 2019-08-10 (×16): qty 1

## 2019-08-10 MED ORDER — TRIAMCINOLONE ACETONIDE 0.1 % EX CREA
1.0000 "application " | TOPICAL_CREAM | Freq: Two times a day (BID) | CUTANEOUS | Status: DC | PRN
  Filled 2019-08-10: qty 15

## 2019-08-10 MED ORDER — FLUCONAZOLE 200 MG PO TABS
200.0000 mg | ORAL_TABLET | Freq: Once | ORAL | Status: AC
  Administered 2019-08-10: 200 mg via ORAL
  Filled 2019-08-10: qty 1

## 2019-08-10 NOTE — Discharge Summary (Signed)
Physician Discharge Summary  Patient ID: Stacy Wagner MRN: [id number] DOB/AGE: 77-Jul-1942 77 y.o.  Admit date: 08/04/2019 Discharge date: 08/10/2019  Admission Diagnoses: Sagittal plane imbalance, thoraco-lumbar scoliosis, lumbar foraminal stenosis, lumbar radiculopathy, lumbago    Discharge Diagnoses: Sagittal plane imbalance, thoraco-lumbar scoliosis, lumbar foraminal stenosis, lumbar radiculopathy, lumbago s/p staged surgeries: Lumbar Five - Sacral One Anterior Lumbar Interbody Fusion (N/A) - Dr Deitra Mayo to assist Lumbar One-Two, Lumbar Two-Three, Lumbar Three-Four, Lumbar Four-Five Anterolateral Lumbar Interbody Fusion (Right) ABDOMINAL EXPOSURE (N/A) Stage I scoliosis surgery and Thoracic Ten to pelvis fixation (N/A) - Thoracic Ten to pelvis fixation APPLICATION OF INTRAOPERATIVE CT SCAN (N/A) with posterolateral T 10 - pelvis       Active Problems:   Idiopathic scoliosis of thoracolumbar region   Discharged Condition: good  Hospital Course: Stacy Wagner was admitted for staged surgeries for correction of sagittal plane imbalance, scoliosis, stenosis, and radiculopathy. She mobilized well with some right leg pain following first surgery. Following second surgery, she reports increased dysesthetic RLE pain which limits her mobility.   Consults: rehabilitation medicine  Significant Diagnostic Studies: radiology: X-Ray: intra-op  Treatments: surgery: Lumbar Five - Sacral One Anterior Lumbar Interbody Fusion (N/A) - Dr Deitra Mayo to assist Lumbar One-Two, Lumbar Two-Three, Lumbar Three-Four, Lumbar Four-Five Anterolateral Lumbar Interbody Fusion (Right) ABDOMINAL EXPOSURE (N/A) Stage I scoliosis surgery  Thoracic Ten to pelvis fixation (N/A) - Thoracic Ten to pelvis fixation APPLICATION OF INTRAOPERATIVE CT SCAN (N/A) with posterolateral T 10 - pelvis    Discharge Exam: Blood pressure (!) 141/56, pulse 87, temperature 98.4 F  (36.9 C), temperature source Oral, resp. rate 20, height 5\' 8"  (1.727 m), weight 72.1 kg, SpO2 98 %. Alert, conversant, smiling. Reports persistent right hip, thigh , and knee pain with any movement. Sensitive to touch about right knee. Plantar and dorsiflexion is strong and pain free bilaterally. Rigt hip flexion is severely limited by pain. Passive ROM sensitive to touch only, but no hip pain elicited. Mild lumbar soreness reported. Belly firm but nontender. No BM since surgery.Hemovac <177ml. Per Dr. Vertell Limber, will continue Decadron today, increase Gabapentin to 400mg  TID, d/c Hemovac.Work on bowels. Mobilize with PT/OT. Hopeful for CIR.  Dr. Vertell Limber pushed gabapentin dose up. This can go as high as 600 TID (limited by side effects). Patient is having dysesthetic pain in her right leg, which should improve with time. She will benefit from inpatient Rehab.     Disposition: Discharge disposition: [place] Not Defined Discharge to [facility]       Discharge Instructions    Diet - low sodium heart healthy   Complete by: As directed    Increase activity slowly   Complete by: As directed    Remove dressing in 48 hours   Complete by: As directed      Allergies as of 08/10/2019      Reactions   Codeine Nausea Only      Medication List    TAKE these medications   acetaminophen 500 MG tablet Commonly known as: TYLENOL Take 1,000 mg by mouth every 6 (six) hours as needed for moderate pain or headache.   b complex vitamins tablet Take 1 tablet by mouth daily.   cyclobenzaprine 5 MG tablet Commonly known as: FLEXERIL Take 1 tablet (5 mg total) by mouth 3 (three) times daily as needed for muscle spasms.   diphenhydrAMINE 25 MG tablet Commonly known as: BENADRYL Take 25 mg by mouth at bedtime as needed for sleep.  esomeprazole 20 MG capsule Commonly known as: NEXIUM Take 20 mg by mouth at bedtime.   estrogens-methylTEST 1.25-2.5  MG tablet Commonly known as: ESTRATEST TAKE 1 TABLET BY MOUTH DAILY   ibuprofen 200 MG tablet Commonly known as: ADVIL Take 800 mg by mouth at bedtime as needed for headache or moderate pain.   levothyroxine 75 MCG tablet Commonly known as: SYNTHROID TAKE 1 TABLET BY MOUTH EVERY DAY ON AN EMPTY STOMACH What changed:   how much to take  how to take this  when to take this   Lysine 500 MG Tabs Take 500 mg by mouth daily.   meclizine 25 MG tablet Commonly known as: ANTIVERT Take 1 tablet (25 mg total) by mouth 3 (three) times daily as needed for dizziness.   OSTEO COMPLEX PO Take 1 tablet by mouth daily.   multivitamin with minerals tablet Take 1 tablet by mouth daily.   HAIR/SKIN/NAILS/BIOTIN PO Take 1 tablet by mouth daily.   OVER THE COUNTER MEDICATION Take 4 tablets by mouth daily. Serovital Supplement   traMADol 50 MG tablet Commonly known as: ULTRAM Take 50 mg by mouth every 6 (six) hours as needed (pain).   triamcinolone cream 0.1 % Commonly known as: KENALOG Apply 1 application topically 2 (two) times daily as needed (irritation).   triamterene-hydrochlorothiazide 75-50 MG tablet Commonly known as: MAXZIDE TAKE 1 TABLET BY MOUTH DAILY WITH BREAKFAST   VITAMIN C PO Take 1 tablet by mouth daily.   VITAMIN D PO Take 1 tablet by mouth daily.        Signed: [name], MD 08/10/2019, 2:29 PM

## 2019-08-10 NOTE — Progress Notes (Addendum)
Subjective: Patient reports "I had my best night last night, but this right leg pain still keps me from moving"  Objective: Vital signs in last 24 hours: Temp:  [97.9 F (36.6 C)-98.4 F (36.9 C)] 98 F (36.7 C) (06/14 0355) Pulse Rate:  [80-105] 81 (06/14 0355) Resp:  [17-20] 17 (06/14 0355) BP: (115-135)/(47-77) 135/54 (06/14 0355) SpO2:  [90 %-100 %] 97 % (06/14 0355)  Intake/Output from previous day: 06/13 0701 - 06/14 0700 In: -  Out: 700 [Urine:600; Drains:100] Intake/Output this shift: No intake/output data recorded.  Alert, conversant, smiling. Reports persistent right hip, thigh , and knee pain with any movement. Sensitive to touch about right knee. Plantar and dorsiflexion is strong and pain free bilaterally. Rigt hip flexion is severely limited by pain. Passive ROM sensitive to touch only, but no hip pain elicited. Mild lumbar soreness reported. Belly firm but nontender. No BM since surgery. Hemovac <141ml.  Lab Results: No results for input(s): WBC, HGB, HCT, PLT in the last 72 hours. BMET No results for input(s): NA, K, CL, CO2, GLUCOSE, BUN, CREATININE, CALCIUM in the last 72 hours.  Studies/Results: No results found.  Assessment/Plan:   LOS: 6 days  Per DRStern, will continue Decadron today, increase Gabapentin to 400mg  TID, d/c Hemovac. Work on bowels. Mobilize with PT/OT. Hopeful for CIR.   PoteatAaron Edelman 08/10/2019, 8:06 AM    I have pushed gabapentin dose up.  This can go as high as 600 TID (limited by side effects).  Patient is having dysesthetic pain in her right leg, which should improve with time.  She will benefit from inpatient Rehab.

## 2019-08-10 NOTE — H&P (Addendum)
Physical Medicine and Rehabilitation Admission H&P    CC: Functional deficits due to lumbar scoliosis with sagittal plane imbalance/DDD.   HPI: Stacy Wagner is a 79 year old female with history of multiple back surgeries with progressive back pain due to lumbar DDD with scoliosis and imbalance. She was admitted on 08/04/19 for 2 staged procedure--she underwent anterior retroperitoneal exposure L5-S1 by Dr. Scot Dock and underwent L5-S1 fusion and L1-L5 anterolateral fusion on 06/08 and posterolateral T10-pelvis fusion on 06/11. Post op has had lot of pain with neuropathy-IV decadron added 6/10 and gabapentin being titrated upwards.  Lumbar drain in place with 330 ml serosanguinous drainage yesterday.   Review of Systems  Constitutional: Negative for chills and fever.  HENT: Positive for hearing loss. Negative for tinnitus.   Eyes: Negative for blurred vision and double vision.  Respiratory: Negative for cough and hemoptysis.   Cardiovascular: Negative for chest pain and palpitations.  Gastrointestinal: Negative for heartburn and nausea.  Genitourinary: Negative for dysuria and urgency.  Musculoskeletal: Positive for back pain. Negative for myalgias.  Skin: Negative for itching and rash.  Neurological: Negative for dizziness and headaches.  Endo/Heme/Allergies: Negative for environmental allergies. Does not bruise/bleed easily.  Psychiatric/Behavioral: Negative for depression and suicidal ideas.    Past Medical History:  Diagnosis Date  . AC (acromioclavicular) joint bone spurs   . Arthritis    "lower back" (01/15/2018)  . Back pain   . Bursitis disorder    bil shoulders  . Cataracts, bilateral   . Chronic lower back pain   . Complication of anesthesia    some nausea in past with anesthesia, pt. also recalls having the "tube removed too early & her her throat was full of mucous, states that she could move any part of her body)  . Family history of adverse reaction to  anesthesia    "daughter gets PONV" (01/15/2018)  . GERD (gastroesophageal reflux disease)   . Hearing loss    bil / no hearing aids  . Hypertension   . Hypothyroidism   . Idiopathic scoliosis   . Leg pain   . Lumbar radiculopathy   . Lumbar stenosis with neurogenic claudication   . Migraine    "used to have them all the time; none in 10 yrs" (01/15/2018)  . Mouth sores   . Neck pain   . OA (osteoarthritis)    Multiple injections in lumbar region   . PONV (postoperative nausea and vomiting)   . Prolapse of female bladder, acquired   . Sagittal plane imbalance   . Skin cancer 11/2017   "left thigh"  . Thyroid disease   . Tongue cancer ([place])    12-2017   Past Surgical History:  Procedure Laterality Date  . ABDOMINAL EXPOSURE N/A 08/04/2019   Procedure: ABDOMINAL EXPOSURE;  Surgeon: Angelia Mould, MD;  Location: [facility] OR;  Service: Vascular;  Laterality: N/A;  . ABDOMINAL HYSTERECTOMY     uterine prolapse  . ABDOMINOPLASTY     tummy tuck Emilio Aspen excess skin arms and legs  . ANTERIOR LATERAL LUMBAR FUSION 4 LEVELS Right 08/04/2019   Procedure: Lumbar One-Two, Lumbar Two-Three, Lumbar Three-Four, Lumbar Four-Five  Anterolateral Lumbar Interbody Fusion;  Surgeon: Erline Levine, MD;  Location: [facility];  Service: Neurosurgery;  Laterality: Right;  . ANTERIOR LUMBAR FUSION N/A 08/04/2019   Procedure: Lumbar Five - Sacral One  Anterior Lumbar Interbody Fusion;  Surgeon: Erline Levine, MD;  Location: [facility];  Service: Neurosurgery;  Laterality: N/A;  Dr  Deitra Mayo to assist  . APPENDECTOMY    . BACK SURGERY    . BREAST SURGERY Right 1980   removal of a nodule- post trauma, benign pathology  . CARPAL TUNNEL RELEASE Right   . CATARACT EXTRACTION, BILATERAL    . COLONOSCOPY    . GLOSSECTOMY N/A 01/15/2018   Procedure: PARTIAL GLOSSECTOMY;  Surgeon: Rozetta Nunnery, MD;  Location: [facility] OR;  Service: ENT;  Laterality: N/A;  . LUMBAR SPINE SURGERY  1988   /w Dr. Joya Salm-  reports that it was in the lumbar region, unsure of date.  Marland Kitchen NASAL POLYP EXCISION    . PARTIAL GLOSSECTOMY  01/15/2018  . SKIN CANCER EXCISION Left 11/2017   "thigh"  . TONGUE SURGERY    . TONSILLECTOMY    . TUMMY TUCK      Family History  Problem Relation Age of Onset  . Cancer Mother        ?lung cancer  . Hypertension Sister   . Diabetes Father   . Stomach cancer Cousin   . Rectal cancer Neg Hx   . Esophageal cancer Neg Hx   . Colon cancer Neg Hx     Social History:  reports that she has never smoked. She has never used smokeless tobacco. She reports that she does not drink alcohol and does not use drugs.    Allergies  Allergen Reactions  . Codeine Nausea Only    Medications Prior to Admission  Medication Sig Dispense Refill  . acetaminophen (TYLENOL) 500 MG tablet Take 1,000 mg by mouth every 6 (six) hours as needed for moderate pain or headache.    . Ascorbic Acid (VITAMIN C PO) Take 1 tablet by mouth daily.     Marland Kitchen b complex vitamins tablet Take 1 tablet by mouth daily.    . diphenhydrAMINE (BENADRYL) 25 MG tablet Take 25 mg by mouth at bedtime as needed for sleep.    Marland Kitchen esomeprazole (NEXIUM) 20 MG capsule Take 20 mg by mouth at bedtime.    Marland Kitchen estrogens-methylTEST (ESTRATEST) 1.25-2.5 MG tablet TAKE 1 TABLET BY MOUTH DAILY 90 tablet 1  . ibuprofen (ADVIL,MOTRIN) 200 MG tablet Take 800 mg by mouth at bedtime as needed for headache or moderate pain.    Marland Kitchen levothyroxine (SYNTHROID) 75 MCG tablet TAKE 1 TABLET BY MOUTH EVERY DAY ON AN EMPTY STOMACH (Patient taking differently: Take 75 mcg by mouth daily before breakfast. TAKE 1 TABLET BY MOUTH EVERY DAY ON AN EMPTY STOMACH) 90 tablet 3  . Lysine 500 MG TABS Take 500 mg by mouth daily.    . Multiple Vitamins-Minerals (HAIR/SKIN/NAILS/BIOTIN PO) Take 1 tablet by mouth daily.    . Multiple Vitamins-Minerals (MULTIVITAMIN WITH MINERALS) tablet Take 1 tablet by mouth daily.    . Multiple Vitamins-Minerals (OSTEO COMPLEX PO) Take 1  tablet by mouth daily.     Marland Kitchen OVER THE COUNTER MEDICATION Take 4 tablets by mouth daily. Serovital Supplement    . traMADol (ULTRAM) 50 MG tablet Take 50 mg by mouth every 6 (six) hours as needed (pain).     . triamcinolone cream (KENALOG) 0.1 % Apply 1 application topically 2 (two) times daily as needed (irritation).     . triamterene-hydrochlorothiazide (MAXZIDE) 75-50 MG tablet TAKE 1 TABLET BY MOUTH DAILY WITH BREAKFAST 90 tablet 1  . VITAMIN D PO Take 1 tablet by mouth daily.     . cyclobenzaprine (FLEXERIL) 5 MG tablet Take 1 tablet (5 mg total) by mouth 3 (three) times daily  as needed for muscle spasms. 10 tablet 0  . meclizine (ANTIVERT) 25 MG tablet Take 1 tablet (25 mg total) by mouth 3 (three) times daily as needed for dizziness. 30 tablet 0    Drug Regimen Review  Drug regimen was reviewed and remains appropriate with no significant issues identified  Home: Home Living Family/patient expects to be discharged to:: Private residence Living Arrangements: Spouse/significant other Available Help at Discharge: Family, Available 24 hours/day Type of Home: House Home Access: Stairs to enter [employer] of Steps: 8 in back, 4 in front no rails Entrance Stairs-Rails: Can reach both Home Layout: One level Bathroom Shower/Tub: Gaffer, Door [employer] Toilet: Standard Home Equipment: Radio producer - single point, Civil engineer, contracting - built in Additional Comments: pt may have access to [facility] and walker.  Lives With: Spouse   Functional History: Prior Function Level of Independence: Independent with assistive device(s) Comments: walked with cane at times when back bothered her  Functional Status:  Mobility: Bed Mobility Overal bed mobility: Needs Assistance Bed Mobility: Rolling, Sidelying to Sit Rolling: Max assist Sidelying to sit: Max assist, +2 for safety/equipment, HOB elevated General bed mobility comments: increased time, cues for sequencing, +rail Transfers Overall  transfer level: Needs assistance Equipment used: Rolling walker (2 wheeled) Transfers: Sit to/from Stand Sit to Stand: +2 physical assistance, Mod assist, +2 safety/equipment Stand pivot transfers: Mod assist, +2 physical assistance, +2 safety/equipment General transfer comment: cues for hand placement, assist to power up, increased time to attain upright stance Ambulation/Gait Ambulation/Gait assistance: +2 safety/equipment, Mod assist Gait Distance (Feet): 10 Feet Assistive device: Rolling walker (2 wheeled) Gait Pattern/deviations: Step-through pattern, Decreased stride length General Gait Details: Assist to maintain balance and progress RW. Pt independently progressing RLE through swing phase today. R knee instability noted but no buckling. +2 for chair follow. Gait velocity: very slow Gait velocity interpretation: <1.8 ft/sec, indicate of risk for recurrent falls    ADL: ADL Overall ADL's : Needs assistance/impaired Eating/Feeding: Set up, Sitting Grooming: Wash/dry hands, Wash/dry face, Oral care, Supervision/safety, Sitting Upper Body Bathing: Minimal assistance, Sitting, Cueing for compensatory techniques Lower Body Bathing: Maximal assistance, Sit to/from stand, Cueing for back precautions, Cueing for compensatory techniques Upper Body Dressing : Moderate assistance, Sitting Upper Body Dressing Details (indicate cue type and reason): full assist with brace at this time Lower Body Dressing: Maximal assistance, Sit to/from stand Lower Body Dressing Details (indicate cue type and reason): will benefit from adaptive equipment Toilet Transfer: Moderate assistance, +2 for safety/equipment, +2 for physical assistance, Stand-pivot, Comfort height toilet, RW, BSC Toileting- Clothing Manipulation and Hygiene: Maximal assistance, Sit to/from stand, Cueing for back precautions, Cueing for compensatory techniques Functional mobility during ADLs: Moderate assistance, +2 for physical  assistance, +2 for safety/equipment, Rolling walker General ADL Comments: Pt requires a great amount of assist with LE adls at this time.  Pt fatigued and in a lot of pain.  Cognition: Cognition Overall Cognitive Status: Within Functional Limits for tasks assessed Orientation Level: Oriented X4 Cognition Arousal/Alertness: Awake/alert Behavior During Therapy: WFL for tasks assessed/performed Overall Cognitive Status: Within Functional Limits for tasks assessed General Comments: mildly anxious regarding mobility due to pain and urinary incontinence  Physical Exam: Blood pressure (!) 141/56, pulse 87, temperature 98.4 F (36.9 C), temperature source Oral, resp. rate 20, height 5\' 8"  (1.727 m), weight 72.1 kg, SpO2 98 %. Physical Exam General: Alert and oriented x 3, No apparent distress HEENT: Head is normocephalic, atraumatic, PERRLA, EOMI, sclera anicteric, oral mucosa pink and moist,  dentition intact, ext ear canals clear,  Neck: Supple without JVD or lymphadenopathy Heart: Reg rate and rhythm. No murmurs rubs or gallops Chest: CTA bilaterally without wheezes, rales, or rhonchi; no distress Abdomen: Soft, non-tender, non-distended, bowel sounds positive. Extremities: No clubbing, cyanosis, or edema. Pulses are 2+ Skin: Abdominal, flank, and spinal incisions C/D/I Neuro: Pt is cognitively appropriate with normal insight, memory, and awareness. Cranial nerves 2-12 are intact. Sensory exam is normal. Fine motor coordination is intact. No tremors. Motor function is grossly 5/5 in bilateral upper extremities and LLE, but RLE HF and KE are severely limited by pain. DF/PF 5/5.  Psych: Pt's affect is appropriate. Pt is cooperative  No results found for this or any previous visit (from the past 48 hour(s)). No results found.  Medical Problem List and Plan: 1.  Impaired mobility and ADLs secondary to L5-S1 anterior lumbar interbody fusion and L1-L5 anterolateral lumbar interbody fusion for  stage 1 scoliosis   -patient may shower but incision must be covered  -ELOS/Goals: modI 12-16 days 2.  Antithrombotics: -DVT/anticoagulation:  Pharmaceutical: Lovenox 3. Pain Management: Continue Oxycodone prn--d/c hydrodocone/hydromorphone. On valium and robaxin prn for muscle spasms.  4. Mood: LCSW to follow for evaluation and support.   -antipsychotic agents: N/A 5. Neuropsych: This patient is capable of making decisions on her own behalf. 6. Skin/Wound Care: Routine pressure relief measures.  7. Fluids/Electrolytes/Nutrition: Monitor I/O. Post op labs pending--will check lytes/CBC  in am.   8. HTN: Monitor BP tid--continue Maxzide. Labile- continue to monitor.  9. Neuropathy: Gabapentin increased to 400 mg tid on 06/14. Continues on 4mg  IV decadron every 8 hrs.   6/14: Gabapentin was increased to 400mg  TID as worst pain is currently the neuropathic pain in RLE. If well tolerated and pain persists, can consider increasing dose further.  10. Hypothyroid: Continue supplement.  11. Thrush: Start Nystatin swish and swallow.  Bary Leriche, PA-C 08/10/2019   I have personally performed a face to face diagnostic evaluation, including, but not limited to relevant history and physical exam findings, of this patient and developed relevant assessment and plan.  Additionally, I have reviewed and concur with the physician assistant's documentation above.  Leeroy Cha, MD

## 2019-08-10 NOTE — Plan of Care (Signed)

## 2019-08-10 NOTE — Plan of Care (Signed)
Care plans have been initiated on patient's arrival to the unit.

## 2019-08-10 NOTE — H&P (Addendum)
Physical Medicine and Rehabilitation Admission H&P    CC: Functional deficits due to lumbar scoliosis with sagittal plane imbalance/DDD.   HPI: Stacy Wagner is a 79 year old female with history of multiple back surgeries with progressive back pain due to lumbar DDD with scoliosis and imbalance. She was admitted on 08/04/19 for 2 staged procedure--she underwent anterior retroperitoneal exposure L5-S1 by Dr. Scot Dock and underwent L5-S1 fusion and L1-L5 anterolateral fusion on 06/08 and posterolateral T10-pelvis fusion on 06/11. Post op has had lot of pain with neuropathy-IV decadron added 6/10 and gabapentin being titrated upwards.  Lumbar drain in place with 330 ml serosanguinous drainage yesterday.   Review of Systems  Constitutional: Negative for chills and fever.  HENT: Positive for hearing loss. Negative for tinnitus.   Eyes: Negative for blurred vision and double vision.  Respiratory: Negative for cough and hemoptysis.   Cardiovascular: Negative for chest pain and palpitations.  Gastrointestinal: Negative for heartburn and nausea.  Genitourinary: Negative for dysuria and urgency.  Musculoskeletal: Positive for back pain. Negative for myalgias.  Skin: Negative for itching and rash.  Neurological: Negative for dizziness and headaches.  Endo/Heme/Allergies: Negative for environmental allergies. Does not bruise/bleed easily.  Psychiatric/Behavioral: Negative for depression and suicidal ideas.    Past Medical History:  Diagnosis Date  . AC (acromioclavicular) joint bone spurs   . Arthritis    "lower back" (01/15/2018)  . Back pain   . Bursitis disorder    bil shoulders  . Cataracts, bilateral   . Chronic lower back pain   . Complication of anesthesia    some nausea in past with anesthesia, pt. also recalls having the "tube removed too early & her her throat was full of mucous, states that she could move any part of her body)  . Family history of adverse reaction to  anesthesia    "daughter gets PONV" (01/15/2018)  . GERD (gastroesophageal reflux disease)   . Hearing loss    bil / no hearing aids  . Hypertension   . Hypothyroidism   . Idiopathic scoliosis   . Leg pain   . Lumbar radiculopathy   . Lumbar stenosis with neurogenic claudication   . Migraine    "used to have them all the time; none in 10 yrs" (01/15/2018)  . Mouth sores   . Neck pain   . OA (osteoarthritis)    Multiple injections in lumbar region   . PONV (postoperative nausea and vomiting)   . Prolapse of female bladder, acquired   . Sagittal plane imbalance   . Skin cancer 11/2017   "left thigh"  . Thyroid disease   . Tongue cancer ([place])    12-2017   Past Surgical History:  Procedure Laterality Date  . ABDOMINAL EXPOSURE N/A 08/04/2019   Procedure: ABDOMINAL EXPOSURE;  Surgeon: Angelia Mould, MD;  Location: [facility] OR;  Service: Vascular;  Laterality: N/A;  . ABDOMINAL HYSTERECTOMY     uterine prolapse  . ABDOMINOPLASTY     tummy tuck Emilio Aspen excess skin arms and legs  . ANTERIOR LATERAL LUMBAR FUSION 4 LEVELS Right 08/04/2019   Procedure: Lumbar One-Two, Lumbar Two-Three, Lumbar Three-Four, Lumbar Four-Five  Anterolateral Lumbar Interbody Fusion;  Surgeon: Erline Levine, MD;  Location: [place];  Service: Neurosurgery;  Laterality: Right;  . ANTERIOR LUMBAR FUSION N/A 08/04/2019   Procedure: Lumbar Five - Sacral One  Anterior Lumbar Interbody Fusion;  Surgeon: Erline Levine, MD;  Location: [place];  Service: Neurosurgery;  Laterality: N/A;  Dr  Deitra Mayo to assist  . APPENDECTOMY    . BACK SURGERY    . BREAST SURGERY Right 1980   removal of a nodule- post trauma, benign pathology  . CARPAL TUNNEL RELEASE Right   . CATARACT EXTRACTION, BILATERAL    . COLONOSCOPY    . GLOSSECTOMY N/A 01/15/2018   Procedure: PARTIAL GLOSSECTOMY;  Surgeon: Rozetta Nunnery, MD;  Location: [facility] OR;  Service: ENT;  Laterality: N/A;  . LUMBAR SPINE SURGERY  1988   /w Dr. Joya Salm-  reports that it was in the lumbar region, unsure of date.  Marland Kitchen NASAL POLYP EXCISION    . PARTIAL GLOSSECTOMY  01/15/2018  . SKIN CANCER EXCISION Left 11/2017   "thigh"  . TONGUE SURGERY    . TONSILLECTOMY    . TUMMY TUCK      Family History  Problem Relation Age of Onset  . Cancer Mother        ?lung cancer  . Hypertension Sister   . Diabetes Father   . Stomach cancer Cousin   . Rectal cancer Neg Hx   . Esophageal cancer Neg Hx   . Colon cancer Neg Hx     Social History:  reports that she has never smoked. She has never used smokeless tobacco. She reports previous alcohol use. She reports that she does not use drugs.    Allergies  Allergen Reactions  . Codeine Nausea Only    Medications Prior to Admission  Medication Sig Dispense Refill  . acetaminophen (TYLENOL) 500 MG tablet Take 1,000 mg by mouth every 6 (six) hours as needed for moderate pain or headache.    . Ascorbic Acid (VITAMIN C PO) Take 1 tablet by mouth daily.     Marland Kitchen b complex vitamins tablet Take 1 tablet by mouth daily.    . cyclobenzaprine (FLEXERIL) 5 MG tablet Take 1 tablet (5 mg total) by mouth 3 (three) times daily as needed for muscle spasms. 10 tablet 0  . diphenhydrAMINE (BENADRYL) 25 MG tablet Take 25 mg by mouth at bedtime as needed for sleep.    Marland Kitchen esomeprazole (NEXIUM) 20 MG capsule Take 20 mg by mouth at bedtime.    Marland Kitchen estrogens-methylTEST (ESTRATEST) 1.25-2.5 MG tablet TAKE 1 TABLET BY MOUTH DAILY 90 tablet 1  . ibuprofen (ADVIL,MOTRIN) 200 MG tablet Take 800 mg by mouth at bedtime as needed for headache or moderate pain.    Marland Kitchen levothyroxine (SYNTHROID) 75 MCG tablet TAKE 1 TABLET BY MOUTH EVERY DAY ON AN EMPTY STOMACH (Patient taking differently: Take 75 mcg by mouth daily before breakfast. TAKE 1 TABLET BY MOUTH EVERY DAY ON AN EMPTY STOMACH) 90 tablet 3  . Lysine 500 MG TABS Take 500 mg by mouth daily.    . meclizine (ANTIVERT) 25 MG tablet Take 1 tablet (25 mg total) by mouth 3 (three) times daily  as needed for dizziness. 30 tablet 0  . Multiple Vitamins-Minerals (HAIR/SKIN/NAILS/BIOTIN PO) Take 1 tablet by mouth daily.    . Multiple Vitamins-Minerals (MULTIVITAMIN WITH MINERALS) tablet Take 1 tablet by mouth daily.    . Multiple Vitamins-Minerals (OSTEO COMPLEX PO) Take 1 tablet by mouth daily.     Marland Kitchen OVER THE COUNTER MEDICATION Take 4 tablets by mouth daily. Serovital Supplement    . traMADol (ULTRAM) 50 MG tablet Take 50 mg by mouth every 6 (six) hours as needed (pain).     . triamcinolone cream (KENALOG) 0.1 % Apply 1 application topically 2 (two) times daily as needed (irritation).     Marland Kitchen  triamterene-hydrochlorothiazide (MAXZIDE) 75-50 MG tablet TAKE 1 TABLET BY MOUTH DAILY WITH BREAKFAST 90 tablet 1  . VITAMIN D PO Take 1 tablet by mouth daily.       Drug Regimen Review  Drug regimen was reviewed and remains appropriate with no significant issues identified  Home: Home Living Family/patient expects to be discharged to:: Private residence Living Arrangements: Spouse/significant other Available Help at Discharge: Family, Available 24 hours/day Type of Home: House Home Access: Stairs to enter [employer] of Steps: 8 in back, 4 in front no rails Entrance Stairs-Rails: Can reach both Home Layout: One level Bathroom Shower/Tub: Gaffer, Door [employer] Toilet: Standard Home Equipment: Radio producer - single point, Civil engineer, contracting - built in Additional Comments: pt may have access to [facility] and walker.  Lives With: Spouse   Functional History: Prior Function Level of Independence: Independent with assistive device(s) Comments: walked with cane at times when back bothered her  Functional Status:  Mobility: Bed Mobility Overal bed mobility: Needs Assistance Bed Mobility: Rolling, Sidelying to Sit Rolling: Max assist Sidelying to sit: Max assist, +2 for safety/equipment, HOB elevated General bed mobility comments: increased time, cues for sequencing,  +rail Transfers Overall transfer level: Needs assistance Equipment used: Rolling walker (2 wheeled) Transfers: Sit to/from Stand Sit to Stand: +2 physical assistance, Mod assist, +2 safety/equipment Stand pivot transfers: Mod assist, +2 physical assistance, +2 safety/equipment General transfer comment: cues for hand placement, assist to power up, increased time to attain upright stance Ambulation/Gait Ambulation/Gait assistance: +2 safety/equipment, Mod assist Gait Distance (Feet): 10 Feet Assistive device: Rolling walker (2 wheeled) Gait Pattern/deviations: Step-through pattern, Decreased stride length General Gait Details: Assist to maintain balance and progress RW. Pt independently progressing RLE through swing phase today. R knee instability noted but no buckling. +2 for chair follow. Gait velocity: very slow Gait velocity interpretation: <1.8 ft/sec, indicate of risk for recurrent falls  ADL: ADL Overall ADL's : Needs assistance/impaired Eating/Feeding: Set up, Sitting Grooming: Wash/dry hands, Wash/dry face, Oral care, Supervision/safety, Sitting Upper Body Bathing: Minimal assistance, Sitting, Cueing for compensatory techniques Lower Body Bathing: Maximal assistance, Sit to/from stand, Cueing for back precautions, Cueing for compensatory techniques Upper Body Dressing : Moderate assistance, Sitting Upper Body Dressing Details (indicate cue type and reason): full assist with brace at this time Lower Body Dressing: Maximal assistance, Sit to/from stand Lower Body Dressing Details (indicate cue type and reason): will benefit from adaptive equipment Toilet Transfer: Moderate assistance, +2 for safety/equipment, +2 for physical assistance, Stand-pivot, Comfort height toilet, RW, BSC Toileting- Clothing Manipulation and Hygiene: Maximal assistance, Sit to/from stand, Cueing for back precautions, Cueing for compensatory techniques Functional mobility during ADLs: Moderate assistance,  +2 for physical assistance, +2 for safety/equipment, Rolling walker General ADL Comments: Pt requires a great amount of assist with LE adls at this time.  Pt fatigued and in a lot of pain.  Cognition: Cognition Overall Cognitive Status: Within Functional Limits for tasks assessed Orientation Level: Oriented X4 Cognition Arousal/Alertness: Awake/alert Behavior During Therapy: WFL for tasks assessed/performed Overall Cognitive Status: Within Functional Limits for tasks assessed General Comments: mildly anxious regarding mobility due to pain and urinary incontinence  Physical Exam: Blood pressure (!) 135/56, pulse 87, temperature 97.8 F (36.6 C), temperature source Oral, resp. rate 20, SpO2 99 %. Physical Exam General: Alert and oriented x 3, No apparent distress HEENT: Head is normocephalic, atraumatic, PERRLA, EOMI, sclera anicteric, oral mucosa pink and moist, dentition intact, ext ear canals clear,  Neck: Supple without JVD or lymphadenopathy Heart:  Reg rate and rhythm. No murmurs rubs or gallops Chest: CTA bilaterally without wheezes, rales, or rhonchi; no distress Abdomen: Soft, non-tender, non-distended, bowel sounds positive. Extremities: No clubbing, cyanosis, or edema. Pulses are 2+ Skin: Abdominal, flank, and spinal incisions C/D/I Neuro: Pt is cognitively appropriate with normal insight, memory, and awareness. Cranial nerves 2-12 are intact. Sensory exam is normal. Fine motor coordination is intact. No tremors. Motor function is grossly 5/5 in bilateral upper extremities and LLE, but RLE HF and KE are severely limited by pain. DF/PF 5/5.  Psych: Pt's affect is appropriate. Pt is cooperative  No results found for this or any previous visit (from the past 48 hour(s)). No results found.  Medical Problem List and Plan: 1.  Impaired mobility and ADLs secondary to L5-S1 anterior lumbar interbody fusion and L1-L5 anterolateral lumbar interbody fusion for stage 1 scoliosis,  complicated by dysesthesias in RLE.  -patient may shower but incision must be covered  -ELOS/Goals: modI 12-16 days 2.  Antithrombotics: -DVT/anticoagulation:  Pharmaceutical: Lovenox 3. Pain Management: Continue Oxycodone prn--d/c hydrodocone/hydromorphone. On valium and robaxin prn for muscle spasms.  4. Mood: LCSW to follow for evaluation and support.   -antipsychotic agents: N/A 5. Neuropsych: This patient is capable of making decisions on her own behalf. 6. Skin/Wound Care: Wound care consult for spinal incision. 7. Fluids/Electrolytes/Nutrition: Monitor I/O. Post op labs pending--will check lytes/CBC  in am.   8. HTN: Monitor BP tid--continue Maxzide. Labile- continue to monitor.  9. Neuropathy: Gabapentin increased to 400 mg tid on 06/14. Continues on 4mg  IV decadron every 8 hrs.   6/14: Gabapentin was increased to 400mg  TID as worst pain is currently the neuropathic pain in RLE. If well tolerated and pain persists, can consider increasing dose further.  10. Hypothyroid: Continue supplement.  11. Thrush: Start Nystatin swish and swallow.  Reesa Chew, PA-C  I have personally performed a face to face diagnostic evaluation, including, but not limited to relevant history and physical exam findings, of this patient and developed relevant assessment and plan.  Additionally, I have reviewed and concur with the physician assistant's documentation above.  Leeroy Cha, MD

## 2019-08-10 NOTE — Progress Notes (Signed)
Suppository given and digitally stimulated and no hard stool felt but the patient stated that she felt like she was going to have a bowel movement.  States she is going to go very soon.

## 2019-08-10 NOTE — PMR Pre-admission (Signed)
PMR Admission Coordinator Pre-Admission Assessment  Patient: Stacy Wagner is an 79 y.o., female MRN: [id number] DOB: 1940/09/16 Height: '5\' 8"'$  (172.7 cm) Weight: 72.1 kg  Insurance Information HMO:     PPO:      PCP:      IPA:      80/20:      OTHER:  PRIMARY: Medicare a and b      Policy#: [id number]      Subscriber: pt Benefits:  Phone #: passport one online     Name: 08/10/2019 Eff. Date: [id number]     Deduct: $1484      Out of Pocket Max: none      Life Max: none CIR: 100%      SNF: 20 full days Outpatient: 80%     Co-Pay: 20% Home Health: 100%      Co-Pay: none DME: 80%     Co-Pay: 20% Providers: pt choice  SECONDARY: BCBS supplement      Policy#: [id number]       Financial Counselor:       Phone#:   [employer]" for patients in Inpatient Rehabilitation Facilities with attached "Privacy Act [place] Records" was provided and verbally reviewed with: Patient and Family  Emergency Contact Information Contact Information    Name Relation Home Work Mobile   [place] Spouse [phone]  [place] Daughter   [phone]      Current Medical History  Patient Admitting Diagnosis: scoliosis, degenerative lumbar spinal stenosis, radiculopathy  History of Present Illness:  Pt is a 79 yo female with PMH of hysterectomy, HTN, and tummy tuck to remove excess skin following weight loss with  long history of back pain, degenerative disc disease,  Scoliosis,  and previous back surgeries. She has  failed conservative treatment including multiple injections and physical therapy. She had disabling pain prior to surgery.   Pt underwent a L4-L5 ALIF and L1-L5 R XLIF 08/04/19 and T10-pelvis fixation 08/07/19. Has TSLO brace.   Patient[facility]  has been reviewed by the rehabilitation admission coordinator and physician.  Past Medical History  Past Medical History:  Diagnosis Date  . AC  (acromioclavicular) joint bone spurs   . Arthritis    "lower back" (01/15/2018)  . Back pain   . Bursitis disorder    bil shoulders  . Cataracts, bilateral   . Chronic lower back pain   . Complication of anesthesia    some nausea in past with anesthesia, pt. also recalls having the "tube removed too early & her her throat was full of mucous, states that she could move any part of her body)  . Family history of adverse reaction to anesthesia    "daughter gets PONV" (01/15/2018)  . GERD (gastroesophageal reflux disease)   . Hearing loss    bil / no hearing aids  . Hypertension   . Hypothyroidism   . Idiopathic scoliosis   . Leg pain   . Lumbar radiculopathy   . Lumbar stenosis with neurogenic claudication   . Migraine    "used to have them all the time; none in 10 yrs" (01/15/2018)  . Mouth sores   . Neck pain   . OA (osteoarthritis)    Multiple injections in lumbar region   . PONV (postoperative nausea and vomiting)   . Prolapse of female bladder, acquired   . Sagittal plane imbalance   . Skin cancer 11/2017   "left thigh"  .  Thyroid disease   . Tongue cancer ([place])    12-2017    Family History   family history includes Cancer in her mother; Diabetes in her father; Hypertension in her sister; Stomach cancer in her cousin.  Prior Rehab/Hospitalizations Has the patient had prior rehab or hospitalizations prior to admission? Yes  Has the patient had major surgery during 100 days prior to admission? Yes   Current Medications  Current Facility-Administered Medications:  .  0.9 %  sodium chloride infusion, 250 mL, Intravenous, Continuous, Erline Levine, MD, Last Rate: 1 mL/hr at 08/04/19 2128, 250 mL at 08/04/19 2128 .  acetaminophen (TYLENOL) tablet 650 mg, 650 mg, Oral, Q4H PRN, 650 mg at 08/05/19 1721 **OR** acetaminophen (TYLENOL) suppository 650 mg, 650 mg, Rectal, Q4H PRN, Erline Levine, MD .  alum & mag hydroxide-simeth (MAALOX/MYLANTA) 200-200-20 MG/5ML suspension 30  mL, 30 mL, Oral, Q6H PRN, Erline Levine, MD .  ascorbic acid (VITAMIN C) tablet 500 mg, 500 mg, Oral, Daily, Erline Levine, MD, 500 mg at 08/09/19 0954 .  bisacodyl (DULCOLAX) suppository 10 mg, 10 mg, Rectal, Daily PRN, Erline Levine, MD .  cholecalciferol (VITAMIN D3) tablet 1,000 Units, 1,000 Units, Oral, Daily, Erline Levine, MD, 1,000 Units at 08/09/19 0955 .  dexamethasone (DECADRON) injection 4 mg, 4 mg, Intravenous, Q8H, Erline Levine, MD, 4 mg at 08/10/19 0157 .  dextrose 5 % and 0.45 % NaCl with KCl 20 mEq/L infusion, , Intravenous, Continuous, Erline Levine, MD, Last Rate: 75 mL/hr at 08/08/19 0543, New Bag at 08/08/19 0543 .  diazepam (VALIUM) tablet 5 mg, 5 mg, Oral, Q6H PRN, Erline Levine, MD, 5 mg at 08/09/19 0954 .  diphenhydrAMINE (BENADRYL) capsule 25 mg, 25 mg, Oral, QHS PRN, Erline Levine, MD, 25 mg at 08/06/19 2113 .  docusate sodium (COLACE) capsule 100 mg, 100 mg, Oral, BID, Erline Levine, MD, 100 mg at 08/09/19 2223 .  enoxaparin (LOVENOX) injection 40 mg, 40 mg, Subcutaneous, Q24H, Vallarie Mare, MD, 40 mg at 08/09/19 1334 .  gabapentin (NEURONTIN) capsule 400 mg, 400 mg, Oral, TID, Erline Levine, MD .  HYDROcodone-acetaminophen (NORCO/VICODIN) 5-325 MG per tablet 2 tablet, 2 tablet, Oral, Q4H PRN, Erline Levine, MD, 2 tablet at 08/10/19 0346 .  HYDROmorphone (DILAUDID) injection 0.5 mg, 0.5 mg, Intravenous, Q2H PRN, Erline Levine, MD, 0.5 mg at 08/09/19 1502 .  levothyroxine (SYNTHROID) tablet 75 mcg, 75 mcg, Oral, Q0600, Erline Levine, MD, 75 mcg at 08/10/19 0600 .  menthol-cetylpyridinium (CEPACOL) lozenge 3 mg, 1 lozenge, Oral, PRN **OR** phenol (CHLORASEPTIC) mouth spray 1 spray, 1 spray, Mouth/Throat, PRN, Erline Levine, MD .  methocarbamol (ROBAXIN) tablet 750 mg, 750 mg, Oral, Q6H PRN, Erline Levine, MD, 750 mg at 08/09/19 2223 .  multivitamin with minerals tablet 1 tablet, 1 tablet, Oral, Daily, Erline Levine, MD, 1 tablet at 08/09/19 0954 .  ondansetron  (ZOFRAN) tablet 4 mg, 4 mg, Oral, Q6H PRN **OR** ondansetron (ZOFRAN) injection 4 mg, 4 mg, Intravenous, Q6H PRN, Erline Levine, MD, 4 mg at 08/04/19 1927 .  oxyCODONE (Oxy IR/ROXICODONE) immediate release tablet 5 mg, 5 mg, Oral, Q3H PRN, Erline Levine, MD, 5 mg at 08/09/19 2223 .  pantoprazole (PROTONIX) EC tablet 40 mg, 40 mg, Oral, Daily, Erline Levine, MD, 40 mg at 08/09/19 0955 .  polyethylene glycol (MIRALAX / GLYCOLAX) packet 17 g, 17 g, Oral, Daily PRN, Erline Levine, MD .  sodium chloride flush (NS) 0.9 % injection 3 mL, 3 mL, Intravenous, Q12H, Erline Levine, MD, 3 mL at 08/09/19 2234 .  sodium chloride flush (NS) 0.9 % injection 3 mL, 3 mL, Intravenous, PRN, Maeola Harman, MD .  sodium phosphate (FLEET) 7-19 GM/118ML enema 1 enema, 1 enema, Rectal, Once PRN, Maeola Harman, MD .  triamcinolone cream (KENALOG) 0.1 % 1 application, 1 application, Topical, BID PRN, Maeola Harman, MD .  triamterene-hydrochlorothiazide (MAXZIDE) 75-50 MG per tablet 1 tablet, 1 tablet, Oral, Q breakfast, Maeola Harman, MD, 1 tablet at 08/09/19 0954 .  zolpidem (AMBIEN) tablet 5 mg, 5 mg, Oral, QHS PRN, Maeola Harman, MD  Patients Current Diet:  Diet Order            Diet regular Room service appropriate? Yes; Fluid consistency: Thin  Diet effective now                 Precautions / Restrictions Precautions Precautions: Back, Fall Precaution Booklet Issued: Yes (comment) Precaution Comments: reviewed 3/3 back precautions Spinal Brace: Thoracolumbosacral orthotic, Applied in sitting position Restrictions Weight Bearing Restrictions: No   Has the patient had 2 or more falls or a fall with injury in the past year? No  Prior Activity Level Community (5-7x/wk): Pt. was very active in the community; drives; self sufficient; just painted two outside building pta  Prior Functional Level Self Care: Did the patient need help bathing, dressing, using the toilet or eating? Independent  Indoor Mobility:  Did the patient need assistance with walking from room to room (with or without device)? Independent  Stairs: Did the patient need assistance with internal or external stairs (with or without device)? Independent  Functional Cognition: Did the patient need help planning regular tasks such as shopping or remembering to take medications? Independent  Home Assistive Devices / Equipment Home Equipment: Cane - single point, Shower seat - built in  Prior Device Use: Indicate devices/aids used by the patient prior to current illness, exacerbation or injury? None of the above  Current Functional Level Cognition  Overall Cognitive Status: Within Functional Limits for tasks assessed Orientation Level: Oriented X4 General Comments: mildly anxious regarding mobility due to pain and urinary incontinence    Extremity Assessment (includes Sensation/Coordination)  Upper Extremity Assessment: Overall WFL for tasks assessed  Lower Extremity Assessment: Defer to PT evaluation RLE Deficits / Details: limited WBing tolerance and active movement due to R hip/anterior thigh pain at 10/10 LLE Deficits / Details: generalized weakness from back surgery but able to move without assist and tolerate WBign    ADLs  Overall ADL's : Needs assistance/impaired Eating/Feeding: Set up, Sitting Grooming: Wash/dry hands, Wash/dry face, Oral care, Supervision/safety, Sitting Upper Body Bathing: Minimal assistance, Sitting, Cueing for compensatory techniques Lower Body Bathing: Maximal assistance, Sit to/from stand, Cueing for back precautions, Cueing for compensatory techniques Upper Body Dressing : Moderate assistance, Sitting Upper Body Dressing Details (indicate cue type and reason): full assist with brace at this time Lower Body Dressing: Maximal assistance, Sit to/from stand Lower Body Dressing Details (indicate cue type and reason): will benefit from adaptive equipment Toilet Transfer: Moderate assistance, +2 for  safety/equipment, +2 for physical assistance, Stand-pivot, Comfort height toilet, RW, BSC Toileting- Clothing Manipulation and Hygiene: Maximal assistance, Sit to/from stand, Cueing for back precautions, Cueing for compensatory techniques Functional mobility during ADLs: Moderate assistance, +2 for physical assistance, +2 for safety/equipment, Rolling walker General ADL Comments: Pt requires a great amount of assist with LE adls at this time.  Pt fatigued and in a lot of pain.    Mobility  Overal bed mobility: Needs Assistance Bed Mobility: Rolling, Sidelying to Sit  Rolling: Max assist Sidelying to sit: Max assist, +2 for safety/equipment, HOB elevated General bed mobility comments: increased time, cues for sequencing, +rail    Transfers  Overall transfer level: Needs assistance Equipment used: Rolling walker (2 wheeled) Transfers: Sit to/from Stand Sit to Stand: +2 physical assistance, Mod assist, +2 safety/equipment Stand pivot transfers: Mod assist, +2 physical assistance, +2 safety/equipment General transfer comment: cues for hand placement, assist to power up, increased time to attain upright stance    Ambulation / Gait / Stairs / Wheelchair Mobility  Ambulation/Gait Ambulation/Gait assistance: +2 safety/equipment, Mod assist Gait Distance (Feet): 10 Feet Assistive device: Rolling walker (2 wheeled) Gait Pattern/deviations: Step-through pattern, Decreased stride length General Gait Details: Assist to maintain balance and progress RW. Pt independently progressing RLE through swing phase today. R knee instability noted but no buckling. +2 for chair follow. Gait velocity: very slow Gait velocity interpretation: <1.8 ft/sec, indicate of risk for recurrent falls    Posture / Balance Balance Overall balance assessment: Needs assistance Sitting-balance support: Feet supported, Bilateral upper extremity supported Sitting balance-Leahy Scale: Fair Postural control: Posterior  lean Standing balance support: Bilateral upper extremity supported, During functional activity Standing balance-Leahy Scale: Poor Standing balance comment: reliant on external support    Special needs/care consideration   Designated visitors are [place] and [employer] urinary incontinence and no BM since admission HOH has hearing aides History of speech impediment due to tongue cancer surgery   Previous Home Environment  Living Arrangements: Spouse/significant other  Lives With: Spouse Available Help at Discharge: Family, Available 24 hours/day Type of Home: House Home Layout: One level Home Access: Stairs to enter Entrance Stairs-Rails: Can reach both Entrance Stairs-Number of Steps: 8 in back, 4 in front no rails Bathroom Shower/Tub: Walk-in shower, Door [employer] Toilet: Standard Additional Comments: pt may have access to [facility] and walker.  Discharge Living Setting Plans for Discharge Living Setting: Patient's home Type of Home at Discharge: House Discharge Home Layout: One level Discharge Home Access: Stairs to enter Entrance Stairs-Rails: None Entrance Stairs-Number of Steps: 4 (4) Discharge Bathroom Shower/Tub: Walk-in shower Discharge Bathroom Toilet: Standard Discharge Bathroom Accessibility: Yes How Accessible: Accessible via wheelchair  Social/Family/Support Systems Patient Roles: Spouse Contact Information: [phone] Anticipated Caregiver: Vernice Mannina Anticipated Caregiver's Contact Information: [phone] Caregiver Availability: 24/7 Discharge Plan Discussed with Primary Caregiver: Yes Is Caregiver In Agreement with Plan?: Yes Does Caregiver/Family have Issues with Lodging/Transportation while Pt is in Rehab?: No  Goals Patient/Family Goal for Rehab: PT/OT Min assist Expected length of stay: 10-12 days Pt/Family Agrees to Admission and willing to participate: Yes Program Orientation Provided & Reviewed with Pt/Caregiver Including Roles  &  Responsibilities: Yes  Decrease burden of Care through IP rehab admission: n/s  Possible need for SNF placement upon discharge: not anticipated  Patient Condition: I have reviewed medical records from [facility] , spoken with CM, and patient, spouse and daughter. I met with patient at the bedside for inpatient rehabilitation assessment.  Patient will benefit from ongoing PT and OT, can actively participate in 3 hours of therapy a day 5 days of the week, and can make measurable gains during the admission.  Patient will also benefit from the coordinated team approach during an Inpatient Acute Rehabilitation admission.  The patient will receive intensive therapy as well as Rehabilitation physician, nursing, social worker, and care management interventions.  Due to bladder management, bowel management, safety, skin/wound care, disease management, medication administration, pain management and patient education the patient requires 24 hour a  day rehabilitation nursing.  The patient is currently mod to max assist with mobility and basic ADLs.  Discharge setting and therapy post discharge at home with home health is anticipated.  Patient has agreed to participate in the Acute Inpatient Rehabilitation Program and will admit today.  Preadmission Screen Completed By:  Cleatrice Burke, 08/10/2019 10:53 AM ______________________________________________________________________   Discussed status with Dr. Ranell Patrick on 08/10/2019 at 11am and received approval for admission today.  Admission Coordinator:  Cleatrice Burke, RN, time 11am Sudie Grumbling 08/10/2019   Assessment/Plan: Diagnosis: L4-L5 ALIF and L1-L5 R XLIF 08/04/19 and  T10-pelvis fixation 08/07/19 1. Does the need for close, 24 hr/day Medical supervision in concert with the patient's rehab needs make it unreasonable for this patient to be served in a less intensive setting? Yes 2. Co-Morbidities requiring supervision/potential complications:  scoliosis, mutipleprior back surgeries 3. Due to bladder management, bowel management, safety, skin/wound care, disease management, medication administration, pain management and patient education, does the patient require 24 hr/day rehab nursing? Yes 4. Does the patient require coordinated care of a physician, rehab nurse, PT, OT, and SLP to address physical and functional deficits in the context of the above medical diagnosis(es)? Yes Addressing deficits in the following areas: balance, endurance, locomotion, strength, transferring, bowel/bladder control, bathing, dressing, feeding, grooming, toileting and psychosocial support 5. Can the patient actively participate in an intensive therapy program of at least 3 hrs of therapy 5 days a week? Yes 6. The potential for patient to make measurable gains while on inpatient rehab is excellent 7. Anticipated functional outcomes upon discharge from inpatient rehab: min assist PT, min assist OT, modified independent SLP 8. Estimated rehab length of stay to reach the above functional goals is: 10-14 days 9. Anticipated discharge destination: Home 10. Overall Rehab/Functional Prognosis: excellent   MD Signature: Leeroy Cha, MD

## 2019-08-10 NOTE — Progress Notes (Signed)
Inpatient Rehabilitation Admissions Coordinator  I met at bedside with patient, daughter and spouse. All in agreement to inpt rehab admit today. I have notified Aaron Edelman , RN for Dr. Vertell Limber as well as TOC team.I will make the arrangements to admit today.  Danne Baxter, RN, MSN Rehab Admissions Coordinator [phone] 08/10/2019 10:40 AM

## 2019-08-10 NOTE — Progress Notes (Signed)
Pt stated she needed to void and possibly have a BM. Pt able to void 200 ml w/ female urinal.PVR bladder scan showed 106 ml.  Pt had small amount of mucous like drainage from rectum. No stool noted in the rectum. Pt states " it feels like I am going to go soon".

## 2019-08-10 NOTE — Progress Notes (Signed)
Patient arrived on the unit via stretcher with the NT from the floor where she was admitted to the hospital.  She was alert and verbal. Daughter and husband were at the bedside, ready and willing to answer any questions.  Patient stated that she felt as if she needed to void and she was offered the female urinal.  After waiting approximately 10 minutes the patient was able to void.  No other obvious signs or symptoms of acute distress were noted.

## 2019-08-10 NOTE — Progress Notes (Signed)
Physical Therapy Treatment Patient Details Name: Stacy Wagner MRN: [id number] DOB: Mar 05, 1940 Today's Date: 08/10/2019    History of Present Illness Pt is a 79 yo female admitted long history of back pain, scoliosis and previous back surgeries. Pt underwent a L4-L5 ALIF and L1-L5 R XLIF 08/04/19 and  T10-pelvis fixation 08/07/19.    PT Comments    Pt with continued c/o severe sciatic pain RLE. Despite her pain, she is very motivated and eager to work with therapy. She required max assist bed mobility, +2 mod assist sit to stand, and mod assist +2 safety ambulation 10' with RW. Pt in recliner at end of session.   Follow Up Recommendations  CIR     Equipment Recommendations  Rolling walker with 5" wheels    Recommendations for Other Services       Precautions / Restrictions Precautions Precautions: Back;Fall Precaution Comments: reviewed 3/3 back precautions Required Braces or Orthoses: Spinal Brace Spinal Brace: Thoracolumbosacral orthotic;Applied in sitting position    Mobility  Bed Mobility Overal bed mobility: Needs Assistance Bed Mobility: Rolling;Sidelying to Sit Rolling: Max assist Sidelying to sit: Max assist;+2 for safety/equipment;HOB elevated       General bed mobility comments: increased time, cues for sequencing, +rail  Transfers Overall transfer level: Needs assistance Equipment used: Rolling walker (2 wheeled) Transfers: Sit to/from Stand Sit to Stand: +2 physical assistance;Mod assist;+2 safety/equipment         General transfer comment: cues for hand placement, assist to power up, increased time to attain upright stance  Ambulation/Gait Ambulation/Gait assistance: +2 safety/equipment;Mod assist Gait Distance (Feet): 10 Feet Assistive device: Rolling walker (2 wheeled) Gait Pattern/deviations: Step-through pattern;Decreased stride length Gait velocity: very slow Gait velocity interpretation: <1.8 ft/sec, indicate of risk for recurrent  falls General Gait Details: Assist to maintain balance and progress RW. Pt independently progressing RLE through swing phase today. R knee instability noted but no buckling. +2 for chair follow.   Stairs             Wheelchair Mobility    Modified Rankin (Stroke Patients Only)       Balance Overall balance assessment: Needs assistance Sitting-balance support: Feet supported;Bilateral upper extremity supported Sitting balance-Leahy Scale: Fair     Standing balance support: Bilateral upper extremity supported;During functional activity Standing balance-Leahy Scale: Poor Standing balance comment: reliant on external support                            Cognition Arousal/Alertness: Awake/alert Behavior During Therapy: WFL for tasks assessed/performed Overall Cognitive Status: Within Functional Limits for tasks assessed                                 General Comments: mildly anxious regarding mobility due to pain and urinary incontinence      Exercises      General Comments        Pertinent Vitals/Pain Pain Assessment: Faces Faces Pain Scale: Hurts whole lot Pain Location: back, RLE Pain Descriptors / Indicators: Grimacing;Guarding;Discomfort;Sore Pain Intervention(s): Limited activity within patient's tolerance;Monitored during session;Repositioned;Patient requesting pain meds-RN notified    Home Living                      Prior Function            PT Goals (current goals can now be found in the care plan section) Acute  Rehab PT Goals Patient Stated Goal: home Progress towards PT goals: Progressing toward goals    Frequency    Min 5X/week      PT Plan Current plan remains appropriate    Co-evaluation              AM-PAC PT "6 Clicks" Mobility   Outcome Measure  Help needed turning from your back to your side while in a flat bed without using bedrails?: A Lot Help needed moving from lying on your back  to sitting on the side of a flat bed without using bedrails?: A Lot Help needed moving to and from a bed to a chair (including a wheelchair)?: A Lot Help needed standing up from a chair using your arms (e.g., wheelchair or bedside chair)?: A Lot Help needed to walk in hospital room?: A Lot Help needed climbing 3-5 steps with a railing? : Total 6 Click Score: 11    End of Session Equipment Utilized During Treatment: Gait belt;Back brace Activity Tolerance: Patient tolerated treatment well Patient left: in chair;with call bell/phone within reach Nurse Communication: Mobility status;Need for lift equipment (stedy for return to bed) PT Visit Diagnosis: Difficulty in walking, not elsewhere classified (R26.2);Pain;Unsteadiness on feet (R26.81)     Time: 3200-3794 PT Time Calculation (min) (ACUTE ONLY): 18 min  Charges:  $Gait Training: 8-22 mins                     Stacy Wagner, PT  Office # [phone] Pager [phone]    Stacy Wagner 08/10/2019, 10:22 AM

## 2019-08-10 NOTE — Progress Notes (Signed)
Izora Ribas, MD  Physician  Physical Medicine and Rehabilitation  PMR Pre-admission     Signed  Date of Service:  08/10/2019 10:44 AM      Related encounter: Admission (Discharged) from 08/04/2019 in [facility]       Show:Clear all '[x]' Manual'[x]' Template'[x]' Copied  Added by: '[x]' Cristina Gong, RN'[x]' Raulkar, Clide Deutscher, MD  '[]' Hover for details PMR Admission Coordinator Pre-Admission Assessment   Patient: Stacy Wagner is an 79 y.o., female MRN: [id number] DOB: 1940/04/29 Height: '5\' 8"'  (172.7 cm) Weight: 72.1 kg   Insurance Information HMO:     PPO:      PCP:      IPA:      80/20:      OTHER:  PRIMARY: Medicare a and b      Policy#: [id number]      Subscriber: pt Benefits:  Phone #: passport one online     Name: 08/10/2019 Eff. Date: [id number]     Deduct: $1484      Out of Pocket Max: none      Life Max: none CIR: 100%      SNF: 20 full days Outpatient: 80%     Co-Pay: 20% Home Health: 100%      Co-Pay: none DME: 80%     Co-Pay: 20% Providers: pt choice  SECONDARY: BCBS supplement      Policy#: [id number]        Financial Counselor:       Phone#:    [employer]" for patients in Inpatient Rehabilitation Facilities with attached "Privacy Act [place] Records" was provided and verbally reviewed with: Patient and Family   Emergency Contact Information         Contact Information     Name Relation Home Work Mobile    [place] Spouse [phone]   [place] Daughter     [phone]         Current Medical History  Patient Admitting Diagnosis: scoliosis, degenerative lumbar spinal stenosis, radiculopathy   History of Present Illness:  Pt is a 79 yo female with PMH of hysterectomy, HTN, and tummy tuck to remove excess skin following weight loss with  long history of back pain, degenerative disc disease,  Scoliosis,  and previous back surgeries.  She has   failed conservative treatment including multiple injections and physical therapy.  She had disabling pain prior to surgery.   Pt underwent a L4-L5 ALIF and L1-L5 R XLIF 08/04/19 and  T10-pelvis fixation 08/07/19. Has TSLO brace.    Patient[facility] medical [facility] from [facility]  has been reviewed by the rehabilitation admission coordinator and physician.   Past Medical History      Past Medical History:  Diagnosis Date  . AC (acromioclavicular) joint bone spurs    . Arthritis      "lower back" (01/15/2018)  . Back pain    . Bursitis disorder      bil shoulders  . Cataracts, bilateral    . Chronic lower back pain    . Complication of anesthesia      some nausea in past with anesthesia, pt. also recalls having the "tube removed too early & her her throat was full of mucous, states that she could move any part of her body)  . Family history of adverse reaction to anesthesia      "daughter gets PONV" (01/15/2018)  . GERD (gastroesophageal reflux disease)    .  Hearing loss      bil / no hearing aids  . Hypertension    . Hypothyroidism    . Idiopathic scoliosis    . Leg pain    . Lumbar radiculopathy    . Lumbar stenosis with neurogenic claudication    . Migraine      "used to have them all the time; none in 10 yrs" (01/15/2018)  . Mouth sores    . Neck pain    . OA (osteoarthritis)      Multiple injections in lumbar region   . PONV (postoperative nausea and vomiting)    . Prolapse of female bladder, acquired    . Sagittal plane imbalance    . Skin cancer 11/2017    "left thigh"  . Thyroid disease    . Tongue cancer ([place])      12-2017      Family History   family history includes Cancer in her mother; Diabetes in her father; Hypertension in her sister; Stomach cancer in her cousin.   Prior Rehab/Hospitalizations Has the patient had prior rehab or hospitalizations prior to admission? Yes   Has the patient had major surgery during 100 days prior to admission? Yes                Current Medications   Current Facility-Administered Medications:  .  0.9 %  sodium chloride infusion, 250 mL, Intravenous, Continuous, Erline Levine, MD, Last Rate: 1 mL/hr at 08/04/19 2128, 250 mL at 08/04/19 2128 .  acetaminophen (TYLENOL) tablet 650 mg, 650 mg, Oral, Q4H PRN, 650 mg at 08/05/19 1721 **OR** acetaminophen (TYLENOL) suppository 650 mg, 650 mg, Rectal, Q4H PRN, Erline Levine, MD .  alum & mag hydroxide-simeth (MAALOX/MYLANTA) 200-200-20 MG/5ML suspension 30 mL, 30 mL, Oral, Q6H PRN, Erline Levine, MD .  ascorbic acid (VITAMIN C) tablet 500 mg, 500 mg, Oral, Daily, Erline Levine, MD, 500 mg at 08/09/19 0954 .  bisacodyl (DULCOLAX) suppository 10 mg, 10 mg, Rectal, Daily PRN, Erline Levine, MD .  cholecalciferol (VITAMIN D3) tablet 1,000 Units, 1,000 Units, Oral, Daily, Erline Levine, MD, 1,000 Units at 08/09/19 0955 .  dexamethasone (DECADRON) injection 4 mg, 4 mg, Intravenous, Q8H, Erline Levine, MD, 4 mg at 08/10/19 0157 .  dextrose 5 % and 0.45 % NaCl with KCl 20 mEq/L infusion, , Intravenous, Continuous, Erline Levine, MD, Last Rate: 75 mL/hr at 08/08/19 0543, New Bag at 08/08/19 0543 .  diazepam (VALIUM) tablet 5 mg, 5 mg, Oral, Q6H PRN, Erline Levine, MD, 5 mg at 08/09/19 0954 .  diphenhydrAMINE (BENADRYL) capsule 25 mg, 25 mg, Oral, QHS PRN, Erline Levine, MD, 25 mg at 08/06/19 2113 .  docusate sodium (COLACE) capsule 100 mg, 100 mg, Oral, BID, Erline Levine, MD, 100 mg at 08/09/19 2223 .  enoxaparin (LOVENOX) injection 40 mg, 40 mg, Subcutaneous, Q24H, Vallarie Mare, MD, 40 mg at 08/09/19 1334 .  gabapentin (NEURONTIN) capsule 400 mg, 400 mg, Oral, TID, Erline Levine, MD .  HYDROcodone-acetaminophen (NORCO/VICODIN) 5-325 MG per tablet 2 tablet, 2 tablet, Oral, Q4H PRN, Erline Levine, MD, 2 tablet at 08/10/19 0346 .  HYDROmorphone (DILAUDID) injection 0.5 mg, 0.5 mg, Intravenous, Q2H PRN, Erline Levine, MD, 0.5 mg at 08/09/19 1502 .  levothyroxine (SYNTHROID) tablet  75 mcg, 75 mcg, Oral, Q0600, Erline Levine, MD, 75 mcg at 08/10/19 0600 .  menthol-cetylpyridinium (CEPACOL) lozenge 3 mg, 1 lozenge, Oral, PRN **OR** phenol (CHLORASEPTIC) mouth spray 1 spray, 1 spray, Mouth/Throat, PRN, Erline Levine, MD .  methocarbamol (ROBAXIN) tablet 750 mg, 750 mg, Oral, Q6H PRN, Erline Levine, MD, 750 mg at 08/09/19 2223 .  multivitamin with minerals tablet 1 tablet, 1 tablet, Oral, Daily, Erline Levine, MD, 1 tablet at 08/09/19 0954 .  ondansetron (ZOFRAN) tablet 4 mg, 4 mg, Oral, Q6H PRN **OR** ondansetron (ZOFRAN) injection 4 mg, 4 mg, Intravenous, Q6H PRN, Erline Levine, MD, 4 mg at 08/04/19 1927 .  oxyCODONE (Oxy IR/ROXICODONE) immediate release tablet 5 mg, 5 mg, Oral, Q3H PRN, Erline Levine, MD, 5 mg at 08/09/19 2223 .  pantoprazole (PROTONIX) EC tablet 40 mg, 40 mg, Oral, Daily, Erline Levine, MD, 40 mg at 08/09/19 0955 .  polyethylene glycol (MIRALAX / GLYCOLAX) packet 17 g, 17 g, Oral, Daily PRN, Erline Levine, MD .  sodium chloride flush (NS) 0.9 % injection 3 mL, 3 mL, Intravenous, Q12H, Erline Levine, MD, 3 mL at 08/09/19 2234 .  sodium chloride flush (NS) 0.9 % injection 3 mL, 3 mL, Intravenous, PRN, Erline Levine, MD .  sodium phosphate (FLEET) 7-19 GM/118ML enema 1 enema, 1 enema, Rectal, Once PRN, Erline Levine, MD .  triamcinolone cream (KENALOG) 0.1 % 1 application, 1 application, Topical, BID PRN, Erline Levine, MD .  triamterene-hydrochlorothiazide (MAXZIDE) 75-50 MG per tablet 1 tablet, 1 tablet, Oral, Q breakfast, Erline Levine, MD, 1 tablet at 08/09/19 0954 .  zolpidem (AMBIEN) tablet 5 mg, 5 mg, Oral, QHS PRN, Erline Levine, MD   Patients Current Diet:     Diet Order                 Diet regular Room service appropriate? Yes; Fluid consistency: Thin  Diet effective now                      Precautions / Restrictions Precautions Precautions: Back, Fall Precaution Booklet Issued: Yes (comment) Precaution Comments: reviewed 3/3 back  precautions Spinal Brace: Thoracolumbosacral orthotic, Applied in sitting position Restrictions Weight Bearing Restrictions: No    Has the patient had 2 or more falls or a fall with injury in the past year? No   Prior Activity Level Community (5-7x/wk): Pt. was very active in the community; drives; self sufficient; just painted two outside building pta   Prior Functional Level Self Care: Did the patient need help bathing, dressing, using the toilet or eating? Independent   Indoor Mobility: Did the patient need assistance with walking from room to room (with or without device)? Independent   Stairs: Did the patient need assistance with internal or external stairs (with or without device)? Independent   Functional Cognition: Did the patient need help planning regular tasks such as shopping or remembering to take medications? Independent   Home Assistive Devices / Equipment Home Equipment: Cane - single point, Shower seat - built in   Prior Device Use: Indicate devices/aids used by the patient prior to current illness, exacerbation or injury? None of the above   Current Functional Level Cognition   Overall Cognitive Status: Within Functional Limits for tasks assessed Orientation Level: Oriented X4 General Comments: mildly anxious regarding mobility due to pain and urinary incontinence    Extremity Assessment (includes Sensation/Coordination)   Upper Extremity Assessment: Overall WFL for tasks assessed  Lower Extremity Assessment: Defer to PT evaluation RLE Deficits / Details: limited WBing tolerance and active movement due to R hip/anterior thigh pain at 10/10 LLE Deficits / Details: generalized weakness from back surgery but able to move without assist and tolerate WBign     ADLs  Overall ADL's : Needs assistance/impaired Eating/Feeding: Set up, Sitting Grooming: Wash/dry hands, Wash/dry face, Oral care, Supervision/safety, Sitting Upper Body Bathing: Minimal assistance,  Sitting, Cueing for compensatory techniques Lower Body Bathing: Maximal assistance, Sit to/from stand, Cueing for back precautions, Cueing for compensatory techniques Upper Body Dressing : Moderate assistance, Sitting Upper Body Dressing Details (indicate cue type and reason): full assist with brace at this time Lower Body Dressing: Maximal assistance, Sit to/from stand Lower Body Dressing Details (indicate cue type and reason): will benefit from adaptive equipment Toilet Transfer: Moderate assistance, +2 for safety/equipment, +2 for physical assistance, Stand-pivot, Comfort height toilet, RW, BSC Toileting- Clothing Manipulation and Hygiene: Maximal assistance, Sit to/from stand, Cueing for back precautions, Cueing for compensatory techniques Functional mobility during ADLs: Moderate assistance, +2 for physical assistance, +2 for safety/equipment, Rolling walker General ADL Comments: Pt requires a great amount of assist with LE adls at this time.  Pt fatigued and in a lot of pain.     Mobility   Overal bed mobility: Needs Assistance Bed Mobility: Rolling, Sidelying to Sit Rolling: Max assist Sidelying to sit: Max assist, +2 for safety/equipment, HOB elevated General bed mobility comments: increased time, cues for sequencing, +rail     Transfers   Overall transfer level: Needs assistance Equipment used: Rolling walker (2 wheeled) Transfers: Sit to/from Stand Sit to Stand: +2 physical assistance, Mod assist, +2 safety/equipment Stand pivot transfers: Mod assist, +2 physical assistance, +2 safety/equipment General transfer comment: cues for hand placement, assist to power up, increased time to attain upright stance     Ambulation / Gait / Stairs / Wheelchair Mobility   Ambulation/Gait Ambulation/Gait assistance: +2 safety/equipment, Mod assist Gait Distance (Feet): 10 Feet Assistive device: Rolling walker (2 wheeled) Gait Pattern/deviations: Step-through pattern, Decreased stride  length General Gait Details: Assist to maintain balance and progress RW. Pt independently progressing RLE through swing phase today. R knee instability noted but no buckling. +2 for chair follow. Gait velocity: very slow Gait velocity interpretation: <1.8 ft/sec, indicate of risk for recurrent falls     Posture / Balance Balance Overall balance assessment: Needs assistance Sitting-balance support: Feet supported, Bilateral upper extremity supported Sitting balance-Leahy Scale: Fair Postural control: Posterior lean Standing balance support: Bilateral upper extremity supported, During functional activity Standing balance-Leahy Scale: Poor Standing balance comment: reliant on external support     Special needs/care consideration   Designated visitors are [place] and [employer] urinary incontinence and no BM since admission HOH has hearing aides History of speech impediment due to tongue cancer surgery    Previous Home Environment  Living Arrangements: Spouse/significant other  Lives With: Spouse Available Help at Discharge: Family, Available 24 hours/day Type of Home: House Home Layout: One level Home Access: Stairs to enter Entrance Stairs-Rails: Can reach both Entrance Stairs-Number of Steps: 8 in back, 4 in front no rails Bathroom Shower/Tub: Walk-in shower, Door [employer] Toilet: Standard Additional Comments: pt may have access to [facility] and walker.   Discharge Living Setting Plans for Discharge Living Setting: Patient's home Type of Home at Discharge: House Discharge Home Layout: One level Discharge Home Access: Stairs to enter Entrance Stairs-Rails: None Entrance Stairs-Number of Steps: 4 (4) Discharge Bathroom Shower/Tub: Walk-in shower Discharge Bathroom Toilet: Standard Discharge Bathroom Accessibility: Yes How Accessible: Accessible via wheelchair   Social/Family/Support Systems Patient Roles: Spouse Contact Information: [phone] Anticipated Caregiver: Jasminemarie Sherrard Anticipated Caregiver's Contact Information: [phone] Caregiver Availability: 24/7 Discharge Plan Discussed with Primary Caregiver: Yes Is Caregiver In Agreement with Plan?: Yes Does  Caregiver/Family have Issues with Lodging/Transportation while Pt is in Rehab?: No   Goals Patient/Family Goal for Rehab: PT/OT Min assist Expected length of stay: 10-12 days Pt/Family Agrees to Admission and willing to participate: Yes Program Orientation Provided & Reviewed with Pt/Caregiver Including Roles  & Responsibilities: Yes   Decrease burden of Care through IP rehab admission: n/s   Possible need for SNF placement upon discharge: not anticipated   Patient Condition: I have reviewed medical records from [facility] , spoken with CM, and patient, spouse and daughter. I met with patient at the bedside for inpatient rehabilitation assessment.  Patient will benefit from ongoing PT and OT, can actively participate in 3 hours of therapy a day 5 days of the week, and can make measurable gains during the admission.  Patient will also benefit from the coordinated team approach during an Inpatient Acute Rehabilitation admission.  The patient will receive intensive therapy as well as Rehabilitation physician, nursing, social worker, and care management interventions.  Due to bladder management, bowel management, safety, skin/wound care, disease management, medication administration, pain management and patient education the patient requires 24 hour a day rehabilitation nursing.  The patient is currently mod to max assist with mobility and basic ADLs.  Discharge setting and therapy post discharge at home with home health is anticipated.  Patient has agreed to participate in the Acute Inpatient Rehabilitation Program and will admit today.   Preadmission Screen Completed By:  Cleatrice Burke, 08/10/2019 10:53 AM ______________________________________________________________________   Discussed  status with Dr. Ranell Patrick on 08/10/2019 at 11am and received approval for admission today.   Admission Coordinator:  Cleatrice Burke, RN, time 11am Sudie Grumbling 08/10/2019    Assessment/Plan: Diagnosis: L4-L5 ALIF and L1-L5 R XLIF 08/04/19 and  T10-pelvis fixation 08/07/19 1. Does the need for close, 24 hr/day Medical supervision in concert with the patient's rehab needs make it unreasonable for this patient to be served in a less intensive setting? Yes 2. Co-Morbidities requiring supervision/potential complications: scoliosis, mutipleprior back surgeries 3. Due to bladder management, bowel management, safety, skin/wound care, disease management, medication administration, pain management and patient education, does the patient require 24 hr/day rehab nursing? Yes 4. Does the patient require coordinated care of a physician, rehab nurse, PT, OT, and SLP to address physical and functional deficits in the context of the above medical diagnosis(es)? Yes Addressing deficits in the following areas: balance, endurance, locomotion, strength, transferring, bowel/bladder control, bathing, dressing, feeding, grooming, toileting and psychosocial support 5. Can the patient actively participate in an intensive therapy program of at least 3 hrs of therapy 5 days a week? Yes 6. The potential for patient to make measurable gains while on inpatient rehab is excellent 7. Anticipated functional outcomes upon discharge from inpatient rehab: min assist PT, min assist OT, modified independent SLP 8. Estimated rehab length of stay to reach the above functional goals is: 10-14 days 9. Anticipated discharge destination: Home 10. Overall Rehab/Functional Prognosis: excellent     MD Signature: Leeroy Cha, MD        Revision History                     Note Details  Author Izora Ribas, MD File Time 08/10/2019 11:03 AM  Author Type Physician Status Signed  Last Editor Izora Ribas, MD Service Physical  Medicine and [place] # [id number] Admit Date 08/10/2019

## 2019-08-11 ENCOUNTER — Encounter (HOSPITAL_COMMUNITY): Payer: Self-pay | Admitting: Neurosurgery

## 2019-08-11 ENCOUNTER — Inpatient Hospital Stay (HOSPITAL_COMMUNITY): Payer: Medicare Other | Admitting: Occupational Therapy

## 2019-08-11 ENCOUNTER — Inpatient Hospital Stay (HOSPITAL_COMMUNITY): Payer: Medicare Other

## 2019-08-11 DIAGNOSIS — M4127 Other idiopathic scoliosis, lumbosacral region: Secondary | ICD-10-CM

## 2019-08-11 LAB — COMPREHENSIVE METABOLIC PANEL
ALT: 22 U/L (ref 0–44)
AST: 26 U/L (ref 15–41)
Albumin: 2.4 g/dL — ABNORMAL LOW (ref 3.5–5.0)
Alkaline Phosphatase: 66 U/L (ref 38–126)
Anion gap: 10 (ref 5–15)
BUN: 17 mg/dL (ref 8–23)
CO2: 26 mmol/L (ref 22–32)
Calcium: 8.5 mg/dL — ABNORMAL LOW (ref 8.9–10.3)
Chloride: 103 mmol/L (ref 98–111)
Creatinine, Ser: 0.66 mg/dL (ref 0.44–1.00)
GFR calc Af Amer: >60 mL/min (ref >60)
GFR calc non Af Amer: >60 mL/min (ref >60)
Glucose, Bld: 131 mg/dL — ABNORMAL HIGH (ref 70–99)
Potassium: 4.4 mmol/L (ref 3.5–5.1)
Sodium: 139 mmol/L (ref 135–145)
Total Bilirubin: 0.6 mg/dL (ref 0.3–1.2)
Total Protein: 5.7 g/dL — ABNORMAL LOW (ref 6.5–8.1)

## 2019-08-11 LAB — CBC WITH DIFFERENTIAL/PLATELET
Abs Immature Granulocytes: 0.19 10*3/uL — ABNORMAL HIGH (ref 0.00–0.07)
Basophils Absolute: 0 10*3/uL (ref 0.0–0.1)
Basophils Relative: 0 %
Eosinophils Absolute: 0 10*3/uL (ref 0.0–0.5)
Eosinophils Relative: 0 %
HCT: 31.4 % — ABNORMAL LOW (ref 36.0–46.0)
Hemoglobin: 10.4 g/dL — ABNORMAL LOW (ref 12.0–15.0)
Immature Granulocytes: 1 %
Lymphocytes Relative: 5 %
Lymphs Abs: 0.9 10*3/uL (ref 0.7–4.0)
MCH: 30.8 pg (ref 26.0–34.0)
MCHC: 33.1 g/dL (ref 30.0–36.0)
MCV: 92.9 fL (ref 80.0–100.0)
Monocytes Absolute: 1.1 10*3/uL — ABNORMAL HIGH (ref 0.1–1.0)
Monocytes Relative: 6 %
Neutro Abs: 15 10*3/uL — ABNORMAL HIGH (ref 1.7–7.7)
Neutrophils Relative %: 88 %
Platelets: 318 10*3/uL (ref 150–400)
RBC: 3.38 MIL/uL — ABNORMAL LOW (ref 3.87–5.11)
RDW: 13.2 % (ref 11.5–15.5)
WBC: 17.3 10*3/uL — ABNORMAL HIGH (ref 4.0–10.5)
nRBC: 0 % (ref 0.0–0.2)

## 2019-08-11 MED ORDER — CALCIUM CITRATE 950 (200 CA) MG PO TABS
200.0000 mg | ORAL_TABLET | Freq: Every day | ORAL | Status: DC
  Administered 2019-08-11 – 2019-08-26 (×16): 200 mg via ORAL
  Filled 2019-08-11 (×16): qty 1

## 2019-08-11 MED ORDER — DEXAMETHASONE 4 MG PO TABS
4.0000 mg | ORAL_TABLET | Freq: Two times a day (BID) | ORAL | Status: AC
  Administered 2019-08-11 – 2019-08-13 (×5): 4 mg via ORAL
  Filled 2019-08-11 (×5): qty 1

## 2019-08-11 MED ORDER — DEXAMETHASONE 4 MG PO TABS
4.0000 mg | ORAL_TABLET | Freq: Every day | ORAL | Status: AC
  Administered 2019-08-14 – 2019-08-16 (×3): 4 mg via ORAL
  Filled 2019-08-11 (×3): qty 1

## 2019-08-11 MED FILL — Heparin Sodium (Porcine) Inj 1000 Unit/ML: INTRAMUSCULAR | Qty: 30 | Status: AC

## 2019-08-11 MED FILL — Sodium Chloride IV Soln 0.9%: INTRAVENOUS | Qty: 1000 | Status: AC

## 2019-08-11 NOTE — Plan of Care (Signed)
  Problem: RH BOWEL ELIMINATION Goal: RH STG MANAGE BOWEL WITH ASSISTANCE Description: STG Manage Bowel with mod Assistance. Outcome: Not Progressing Goal: RH STG MANAGE BOWEL W/MEDICATION W/ASSISTANCE Description: STG Manage Bowel with Medication with mod Assistance. Outcome: Not Progressing

## 2019-08-11 NOTE — Progress Notes (Signed)
Pt was able to void 300 ml, urine had moderate amount thick/mucous like sediment. Pt denies burning or pain when voiding.  Pt also very tearful. States she is worried about her husbands health condition, and if she will ever get better so she can take care of him. Emotional support was provided to the pt. Pt resting in bed at this time.

## 2019-08-11 NOTE — Evaluation (Signed)
Occupational Therapy Assessment and Plan  Patient Details  Name: Stacy Wagner MRN: [id number] Date of Birth: Nov 08, 1940  OT Diagnosis: acute pain and muscle weakness (generalized) Rehab Potential: Rehab Potential (ACUTE ONLY): Good ELOS: 12-14 days   Today's Date: 08/11/2019 OT Individual Time: 1941-7408 OT Individual Time Calculation (min): 60 min     Problem List:  Patient Active Problem List   Diagnosis Date Noted  . Scoliosis of lumbosacral spine 08/10/2019  . Idiopathic scoliosis of thoracolumbar region 08/04/2019  . Colon adenomas 10/21/2018  . Cancer of lateral margin of anterior two-thirds of tongue ([place]) 01/15/2018  . Bursitis of left shoulder 08/10/2015  . Postmenopausal 10/15/2014  . Dizziness and giddiness 08/13/2014  . Nausea without vomiting 08/13/2014  . GERD (gastroesophageal reflux disease) 10/08/2013  . Obesity (BMI 30-39.9) 10/01/2012  . VITAMIN D DEFICIENCY 09/08/2009  . Hyperlipidemia 09/08/2009  . FREQUENCY, URINARY 09/08/2009  . UNS ADVRS EFF OTH RX MEDICINAL&BIOLOGICAL SBSTNC 09/08/2009  . INSOMNIA 08/25/2008  . WEIGHT GAIN 08/25/2008  . DEGENERATIVE JOINT DISEASE, CERVICAL SPINE 07/15/2008  . Essential hypertension 06/28/2008  . OSTEOARTHRITIS 06/28/2008  . NECK PAIN 06/28/2008  . Hypothyroidism 08/20/2007  . LABYRINTHITIS, CHRONIC 08/20/2007  . EDEMA 08/20/2007  . HEADACHE 08/20/2007  . SYNDROME, CARPAL TUNNEL 08/15/2006    Past Medical History:  Past Medical History:  Diagnosis Date  . AC (acromioclavicular) joint bone spurs   . Arthritis    "lower back" (01/15/2018)  . Back pain   . Bursitis disorder    bil shoulders  . Cataracts, bilateral   . Chronic lower back pain   . Complication of anesthesia    some nausea in past with anesthesia, pt. also recalls having the "tube removed too early & her her throat was full of mucous, states that she could move any part of her body)  . Family history of adverse reaction to anesthesia     "daughter gets PONV" (01/15/2018)  . GERD (gastroesophageal reflux disease)   . Hearing loss    bil / no hearing aids  . Hypertension   . Hypothyroidism   . Idiopathic scoliosis   . Leg pain   . Lumbar radiculopathy   . Lumbar stenosis with neurogenic claudication   . Migraine    "used to have them all the time; none in 10 yrs" (01/15/2018)  . Mouth sores   . Neck pain   . OA (osteoarthritis)    Multiple injections in lumbar region   . PONV (postoperative nausea and vomiting)   . Prolapse of female bladder, acquired   . Sagittal plane imbalance   . Skin cancer 11/2017   "left thigh"  . Thyroid disease   . Tongue cancer ([place])    12-2017   Past Surgical History:  Past Surgical History:  Procedure Laterality Date  . ABDOMINAL EXPOSURE N/A 08/04/2019   Procedure: ABDOMINAL EXPOSURE;  Surgeon: Angelia Mould, MD;  Location[facility];  Service: Vascular;  Laterality: N/A;  . ABDOMINAL HYSTERECTOMY     uterine prolapse  . ABDOMINOPLASTY     tummy tuck Emilio Aspen excess skin arms and legs  . ANTERIOR LATERAL LUMBAR FUSION 4 LEVELS Right 08/04/2019   Procedure: Lumbar One-Two, Lumbar Two-Three, Lumbar Three-Four, Lumbar Four-Five  Anterolateral Lumbar Interbody Fusion;  Surgeon: Erline Levine, MD;  Location: [place];  Service: Neurosurgery;  Laterality: Right;  . ANTERIOR LUMBAR FUSION N/A 08/04/2019   Procedure: Lumbar Five - Sacral One  Anterior Lumbar Interbody Fusion;  Surgeon: Erline Levine, MD;  Location: [facility] OR;  Service: Neurosurgery;  Laterality: N/A;  Dr Deitra Mayo to assist  . APPENDECTOMY    . APPLICATION OF INTRAOPERATIVE CT SCAN N/A 08/07/2019   Procedure: APPLICATION OF INTRAOPERATIVE CT SCAN;  Surgeon: Erline Levine, MD;  Location: [facility];  Service: Neurosurgery;  Laterality: N/A;  . BACK SURGERY    . BREAST SURGERY Right 1980   removal of a nodule- post trauma, benign pathology  . CARPAL TUNNEL RELEASE Right   . CATARACT EXTRACTION, BILATERAL    . COLONOSCOPY     . GLOSSECTOMY N/A 01/15/2018   Procedure: PARTIAL GLOSSECTOMY;  Surgeon: Rozetta Nunnery, MD;  Location: [facility] OR;  Service: ENT;  Laterality: N/A;  . LUMBAR SPINE SURGERY  1988   /w Dr. Joya Salm- reports that it was in the lumbar region, unsure of date.  Marland Kitchen NASAL POLYP EXCISION    . PARTIAL GLOSSECTOMY  01/15/2018  . POSTERIOR LUMBAR FUSION 4 LEVEL N/A 08/07/2019   Procedure: Thoracic Ten to pelvis fixation;  Surgeon: Erline Levine, MD;  Location: [place];  Service: Neurosurgery;  Laterality: N/A;  Thoracic Ten to pelvis fixation  . SKIN CANCER EXCISION Left 11/2017   "thigh"  . TONGUE SURGERY    . TONSILLECTOMY    . TUMMY TUCK      Assessment & Plan Clinical Impression: Stacy Wagner is a 79 year old female with history of multiple back surgeries with progressive back pain due to lumbar DDD with scoliosis and imbalance. She was admitted on 08/04/19 for 2 staged procedure--she underwent anterior retroperitoneal exposure L5-S1 by Dr. Scot Dock and underwent L5-S1 fusion and L1-L5 anterolateral fusion on 06/08 and posterolateral T10-pelvis fusion on 06/11. Post op has had lot of pain with neuropathy-IV decadron added 6/10 and gabapentin being titrated upwards.  Lumbar drain in place with 330 ml serosanguinous drainage yesterday.  Patient transferred to CIR on 08/10/2019 .    Patient currently requires max with basic self-care skills secondary to muscle weakness, decreased cardiorespiratoy endurance and decreased standing balance and decreased balance strategies.  Prior to hospitalization, patient was fully independent and active.  Patient will benefit from skilled intervention to increase independence with basic self-care skills prior to discharge home with care partner.  Anticipate patient will require intermittent supervision and follow up home health.  OT - End of Session Activity Tolerance: Tolerates 10 - 20 min activity with multiple rests Endurance Deficit: Yes Endurance Deficit  Description: endurance impacted by pain, especially in RLE OT Assessment Rehab Potential (ACUTE ONLY): Good OT Patient demonstrates impairments in the following area(s): Balance;Endurance;Motor;Pain;Sensory OT Basic ADL's Functional Problem(s): Bathing;Dressing;Toileting OT Transfers Functional Problem(s): Toilet;Tub/Shower OT Additional Impairment(s): None OT Plan OT Intensity: Minimum of 1-2 x/day, 45 to 90 minutes OT Frequency: 5 out of 7 days OT Duration/Estimated Length of Stay: 12-14 days OT Treatment/Interventions: Balance/vestibular training;Discharge planning;Functional mobility training;DME/adaptive equipment instruction;Pain management;Patient/family education;Self Care/advanced ADL retraining;Therapeutic Activities;Therapeutic Exercise;UE/LE Strength taining/ROM OT Self Feeding Anticipated Outcome(s): no goal, pt is independent OT Basic Self-Care Anticipated Outcome(s): Supervision OT Toileting Anticipated Outcome(s): Supervision OT Bathroom Transfers Anticipated Outcome(s): supervision to toilet, min A to shower OT Recommendation Patient destination: Home Follow Up Recommendations: Home health OT Equipment Recommended: To be determined   Skilled Therapeutic Intervention Pt seen for initial evaluation and ADL training with a focus on back precautions. Reviewed with pt role of OT, discussed her goals. Pt is very HOH and her hearing aids were not working well so conversation was challenging.  Overall pt followed directions very well  and was cautious with her new back precautions.  She sat to EOB and then use RW with min A to sit on BSC.  Pt was able to void urine. From BSC, pt bathed UB and donned shirt with set up, max A with TLSO.    Due to no AE available, washed pt's lower legs and donned pants and socks for her. She did well with all of her sit to stands (min) to RW.    Transferred to wc to complete grooming at sink. Set up in wc with chair alarm and all needs met to  prepare for lunch.    OT Evaluation Precautions/Restrictions  Precautions Precautions: Back;Fall Required Braces or Orthoses: Spinal Brace Spinal Brace: Thoracolumbosacral orthotic;Applied in sitting position Restrictions Weight Bearing Restrictions: No    Pain Pain Assessment Pain Scale: 0-10 Pain Score: 2  Pain Type: Acute pain Pain Location: Leg Pain Orientation: Right Pain Descriptors / Indicators: Sharp Pain Onset: On-going Pain Intervention(s): Medication (See eMAR) (prior to therapy) Home Living/Prior [place] expects to be discharged to:: Private residence Living Arrangements: Spouse/significant other Available Help at Discharge: Family, Available 24 hours/day Type of Home: House Home Access: Stairs to enter [employer] of Steps: 8 in back, 4 in front no rails Entrance Stairs-Rails: Can reach both Home Layout: One level Bathroom Shower/Tub: Gaffer, Charity fundraiser: Standard  Lives With: Spouse Prior Function Level of Independence: Independent with basic ADLs, Independent with homemaking with ambulation, Independent with gait  Able to Take Stairs?: Yes Driving: Yes Vocation: Retired ADL ADL Eating: Independent Grooming: Independent Where Assessed-Grooming: Sitting at sink Upper Body Bathing: Setup Where Assessed-Upper Body Bathing: Chair ([place]) Lower Body Bathing: Moderate assistance Where Assessed-Lower Body Bathing: Chair Upper Body Dressing: Setup (A with orthosis) Where Assessed-Upper Body Dressing: Chair Lower Body Dressing: Maximal assistance Where Assessed-Lower Body Dressing: Chair Toileting: Maximal assistance Where Assessed-Toileting: Bedside Commode Toilet Transfer: Minimal assistance Toilet Transfer Method: Stand pivot Toilet Transfer Equipment: Bedside commode Vision Baseline Vision/History: No visual deficits;Wears glasses Wears Glasses: Reading only Patient Visual Report: No change  from baseline Vision Assessment?: No apparent visual deficits Perception  Perception: Within Functional Limits Praxis Praxis: Intact Cognition Overall Cognitive Status: Within Functional Limits for tasks assessed Arousal/Alertness: Awake/alert Orientation Level: Person;Place;Situation Person: Oriented Place: Oriented Situation: Oriented Year: 2021 Month: June Day of Week: Correct Memory: Appears intact Immediate Memory Recall: Sock;Blue;Bed Memory Recall Sock: Without Cue Memory Recall Blue: Without Cue Memory Recall Bed: Without Cue Safety/Judgment: Appears intact Sensation Sensation Light Touch: Appears Intact Hot/Cold: Appears Intact Proprioception: Appears Intact Stereognosis: Appears Intact Additional Comments: Light touch illicited pain in RLE sciatic distribution Coordination Gross Motor Movements are Fluid and Coordinated: Yes Fine Motor Movements are Fluid and Coordinated: Yes Motor  Motor Motor: Within Functional Limits Motor - Skilled Clinical Observations: generalized limitations due to pain Mobility    min a sit to stand and stand pivot transfers with RW Trunk/Postural Assessment  Postural Control Postural Control: Within Functional Limits (in TLSO brace)  Balance Static Sitting Balance Static Sitting - Level of Assistance: 5: Stand by assistance Dynamic Sitting Balance Dynamic Sitting - Level of Assistance: 4: Min assist (limited by back precautions) Static Standing Balance Static Standing - Level of Assistance: 5: Stand by assistance (with BUE support on walker) Dynamic Standing Balance Dynamic Standing - Level of Assistance: 4: Min assist;3: Mod assist Extremity/Trunk Assessment RUE Assessment RUE Assessment: Within Functional Limits LUE Assessment LUE Assessment: Within Functional Limits     Refer to  Care Plan for Long Term Goals  Recommendations for other services: None    Discharge Criteria: Patient will be discharged from OT if  patient refuses treatment 3 consecutive times without medical reason, if treatment goals not met, if there is a change in medical status, if patient makes no progress towards goals or if patient is discharged from hospital.  The above assessment, treatment plan, treatment alternatives and goals were discussed and mutually agreed upon: by patient  [facility] 08/11/2019, 12:49 PM

## 2019-08-11 NOTE — Consult Note (Signed)
WOC Nurse Consult Note: Patient receiving care in [facility] [phone].  Patient able to turn self to left side for back surgical site assessment Reason for Consult: "spinal incision monitoring and wound care recommendations" Wound type: Multiple surgical incisions.  Along the length of the spine there is an intact "honeycomb" dressing without drainage.  The remainder of the visible incision sites on the right iliac crest area appear to have skin glue over them, no other dressings. Pressure Injury POA: Yes/No/NA Measurement: Wound bed: Drainage (amount, consistency, odor)  Periwound: Dressing procedure/placement/frequency: Please reach out to Dr. Erline Levine to determine when the surgical dressing can be removed, and send any questions or concerns directly to Dr. Vickie Epley. Monitor the wound area(s) for worsening of condition such as: Signs/symptoms of infection,  Increase in size,  Development of or worsening of odor, Development of pain, or increased pain at the affected locations.  Notify the medical team if any of these develop.  Thank you for the consult.  Discussed plan of care with the patient and bedside nurse.  [place] nurse will not follow at this time.  Please re-consult the [place] team if needed.  Val Riles, RN, MSN, CWOCN, CNS-BC, pager [phone]

## 2019-08-11 NOTE — Progress Notes (Signed)
Inpatient Rehabilitation  Patient information reviewed and entered into eRehab system by Nikky Duba M. Francois Elk, M.A., CCC/SLP, PPS Coordinator.  Information including medical coding, functional ability and quality indicators will be reviewed and updated through discharge.    

## 2019-08-11 NOTE — Progress Notes (Signed)
Pt offered the Enema and/or Miralax, Pt declined. Pt requested Prune Juice and  States it has been effective in  the past.

## 2019-08-11 NOTE — Patient Care Conference (Signed)
Inpatient RehabilitationTeam Conference and Plan of Care Update Date: 08/11/2019   Time: 5:20 PM    Patient Name: Stacy Wagner      Medical Record Number: [id number]  Date of Birth: Jun 05, 1940 Sex: Female         Room/Bed: 4M02C/4M02C-01 Payor Info: Payor: MEDICARE / Plan: MEDICARE PART A AND B / Product Type: *No Product type* /    Admit Date/Time:  08/10/2019  4:28 PM  Primary Diagnosis:  Scoliosis of lumbosacral spine  Patient Active Problem List   Diagnosis Date Noted   Scoliosis of lumbosacral spine 08/10/2019   Idiopathic scoliosis of thoracolumbar region 08/04/2019   Colon adenomas 10/21/2018   Cancer of lateral margin of anterior two-thirds of tongue ([place]) 01/15/2018   Bursitis of left shoulder 08/10/2015   Postmenopausal 10/15/2014   Dizziness and giddiness 08/13/2014   Nausea without vomiting 08/13/2014   GERD (gastroesophageal reflux disease) 10/08/2013   Obesity (BMI 30-39.9) 10/01/2012   VITAMIN D DEFICIENCY 09/08/2009   Hyperlipidemia 09/08/2009   FREQUENCY, URINARY 09/08/2009   UNS ADVRS EFF OTH RX MEDICINAL&BIOLOGICAL SBSTNC 09/08/2009   INSOMNIA 08/25/2008   WEIGHT GAIN 08/25/2008   DEGENERATIVE JOINT DISEASE, CERVICAL SPINE 07/15/2008   Essential hypertension 06/28/2008   OSTEOARTHRITIS 06/28/2008   NECK PAIN 06/28/2008   Hypothyroidism 08/20/2007   LABYRINTHITIS, CHRONIC 08/20/2007   EDEMA 08/20/2007   HEADACHE 08/20/2007   SYNDROME, CARPAL TUNNEL 08/15/2006    Expected Discharge Date: Expected Discharge Date:  (12-16 days)  Team Members Present: Physician leading conference: Dr. Leeroy Cha Care Coodinator Present: [name], LCSWA;Other (comment) Dorthula Nettles, RN, BSN, CRRN) Nurse Present: Dwaine Gale, RN PT Present: Tereasa Coop, PT OT Present: Elisabeth Most, OT PPS Coordinator present : Ileana Ladd, PT     Current Status/Progress Goal Weekly Team Focus  Bowel/Bladder   Pt is continet/incontinet   of bladder/bowel. Pt has order for PVR; pt has been below 250 each scan. LBM 08/06/19, Suppository and Dig stem x2 performed. no Results at this time.  Pt will be contient of B/B, with normal bowel pattern.  Q2h toileting/PVR Scan.   Swallow/Nutrition/ Hydration             ADL's             Mobility   modA bed mobility, functional transfers, CGA 25' with RW, minA x5 steps with BHR  Supervision  bed mobility, funcitonal transfers, ambulation, pain managment   Communication             Safety/Cognition/ Behavioral Observations            Pain   pt c/o 10/10 RLE pain. Increased pain with touch. PRN Oxy and Robaxian effective.  Pt pain will be less than 3.  Assess pain qshift/PRN   Skin   Pt has surgical incision to spine w/honeycomb- Pt has 4 in incision to the LLQ of the abdomen; open to air- 2 sm incisions to upper R flank, open to air.  Skin will be free of infection and further breakdown.  Assess skin Qshift/PRN    Rehab Goals Patient on target to meet rehab goals: Yes *See Care Plan and progress notes for long and short-term goals.     Barriers to Discharge  Current Status/Progress Possible Resolutions Date Resolved   Nursing  Incontinence;Neurogenic Bowel & Bladder               PT  Home environment access/layout  8 STE with BHRs  OT                  SLP                Care Coordinator                Discharge Planning/Teaching Needs:  D/c to home with support from dtr and husband.  Family education as recommended   Team Discussion:  Pt able to use female urinal with assistance. Urine has a lot of sediment and mucus per nursing notes. Bowel program in place. Bowels producing a lot of mucus at this time per nursing notes. Nursing to begin patient and family education. OT eval pending. Pain in RLE , ambulation with RW 25" and min assist x5 stairs with BHR. Pain a limiting factor with therapy.  Revisions to Treatment Plan:  MD to change IV Decadron 4 mg  injection to Decadron 4 mg tablet.    Medical Summary Current Status: Impaired mobility and ADLs secondary to L5-S1 anterior lumbar interbody fusion and L1-L5 anterolateral lumbar interbody fusion for stage 1 scoliosis, complicated by dysesthesias in RLE. Weekly Focus/Goal: Imrpoved pain control while minimizing sedation, management of thrush with nystatin, monitoring of surgical insicion, prednisone taper  Barriers to Discharge: Medical stability;Neurogenic Bowel & Bladder  Barriers to Discharge Comments: Impaired mobility and ADLs secondary to L5-S1 anterior lumbar interbody fusion and L1-L5 anterolateral lumbar interbody fusion for stage 1 scoliosis, complicated by dysesthesias in RLE. Possible Resolutions to Barriers: Imrpoved pain control while minimizing sedation, management of thrush with nystatin, Imrpoved pain control while minimizing sedation, management of thrush with nystatin, monitoring of surgical insicion, prednisone taper   Continued Need for Acute Rehabilitation Level of Care: The patient requires daily medical management by a physician with specialized training in physical medicine and rehabilitation for the following reasons: Direction of a multidisciplinary physical rehabilitation program to maximize functional independence : Yes Medical management of patient stability for increased activity during participation in an intensive rehabilitation regime.: Yes Analysis of laboratory values and/or radiology reports with any subsequent need for medication adjustment and/or medical intervention. : Yes   I attest that I was present, lead the team conference, and concur with the assessment and plan of the team.   Cristi Loron 08/11/2019, 5:20 PM

## 2019-08-11 NOTE — Progress Notes (Signed)
Inpatient Rehabilitation Care Coordinator Assessment and Plan  Patient Details  Name: Stacy Wagner MRN: [id number] Date of Birth: January 25, 1941  Today's Date: 08/11/2019  Problem List:  Patient Active Problem List   Diagnosis Date Noted  . Scoliosis of lumbosacral spine 08/10/2019  . Idiopathic scoliosis of thoracolumbar region 08/04/2019  . Colon adenomas 10/21/2018  . Cancer of lateral margin of anterior two-thirds of tongue ([place]) 01/15/2018  . Bursitis of left shoulder 08/10/2015  . Postmenopausal 10/15/2014  . Dizziness and giddiness 08/13/2014  . Nausea without vomiting 08/13/2014  . GERD (gastroesophageal reflux disease) 10/08/2013  . Obesity (BMI 30-39.9) 10/01/2012  . VITAMIN D DEFICIENCY 09/08/2009  . Hyperlipidemia 09/08/2009  . FREQUENCY, URINARY 09/08/2009  . UNS ADVRS EFF OTH RX MEDICINAL&BIOLOGICAL SBSTNC 09/08/2009  . INSOMNIA 08/25/2008  . WEIGHT GAIN 08/25/2008  . DEGENERATIVE JOINT DISEASE, CERVICAL SPINE 07/15/2008  . Essential hypertension 06/28/2008  . OSTEOARTHRITIS 06/28/2008  . NECK PAIN 06/28/2008  . Hypothyroidism 08/20/2007  . LABYRINTHITIS, CHRONIC 08/20/2007  . EDEMA 08/20/2007  . HEADACHE 08/20/2007  . SYNDROME, CARPAL TUNNEL 08/15/2006   Past Medical History:  Past Medical History:  Diagnosis Date  . AC (acromioclavicular) joint bone spurs   . Arthritis    "lower back" (01/15/2018)  . Back pain   . Bursitis disorder    bil shoulders  . Cataracts, bilateral   . Chronic lower back pain   . Complication of anesthesia    some nausea in past with anesthesia, pt. also recalls having the "tube removed too early & her her throat was full of mucous, states that she could move any part of her body)  . Family history of adverse reaction to anesthesia    "daughter gets PONV" (01/15/2018)  . GERD (gastroesophageal reflux disease)   . Hearing loss    bil / no hearing aids  . Hypertension   . Hypothyroidism   . Idiopathic scoliosis   . Leg  pain   . Lumbar radiculopathy   . Lumbar stenosis with neurogenic claudication   . Migraine    "used to have them all the time; none in 10 yrs" (01/15/2018)  . Mouth sores   . Neck pain   . OA (osteoarthritis)    Multiple injections in lumbar region   . PONV (postoperative nausea and vomiting)   . Prolapse of female bladder, acquired   . Sagittal plane imbalance   . Skin cancer 11/2017   "left thigh"  . Thyroid disease   . Tongue cancer ([place])    12-2017   Past Surgical History:  Past Surgical History:  Procedure Laterality Date  . ABDOMINAL EXPOSURE N/A 08/04/2019   Procedure: ABDOMINAL EXPOSURE;  Surgeon: Angelia Mould, MD;  Location: [facility] OR;  Service: Vascular;  Laterality: N/A;  . ABDOMINAL HYSTERECTOMY     uterine prolapse  . ABDOMINOPLASTY     tummy tuck Emilio Aspen excess skin arms and legs  . ANTERIOR LATERAL LUMBAR FUSION 4 LEVELS Right 08/04/2019   Procedure: Lumbar One-Two, Lumbar Two-Three, Lumbar Three-Four, Lumbar Four-Five  Anterolateral Lumbar Interbody Fusion;  Surgeon: Erline Levine, MD;  Location: [place];  Service: Neurosurgery;  Laterality: Right;  . ANTERIOR LUMBAR FUSION N/A 08/04/2019   Procedure: Lumbar Five - Sacral One  Anterior Lumbar Interbody Fusion;  Surgeon: Erline Levine, MD;  Location: [place];  Service: Neurosurgery;  Laterality: N/A;  Dr Deitra Mayo to assist  . APPENDECTOMY    . APPLICATION OF INTRAOPERATIVE CT SCAN N/A 08/07/2019   Procedure: APPLICATION  OF INTRAOPERATIVE CT SCAN;  Surgeon: Erline Levine, MD;  Location: [place];  Service: Neurosurgery;  Laterality: N/A;  . BACK SURGERY    . BREAST SURGERY Right 1980   removal of a nodule- post trauma, benign pathology  . CARPAL TUNNEL RELEASE Right   . CATARACT EXTRACTION, BILATERAL    . COLONOSCOPY    . GLOSSECTOMY N/A 01/15/2018   Procedure: PARTIAL GLOSSECTOMY;  Surgeon: Rozetta Nunnery, MD;  Location: [facility] OR;  Service: ENT;  Laterality: N/A;  . LUMBAR SPINE SURGERY  1988   /w Dr.  Joya Salm- reports that it was in the lumbar region, unsure of date.  Marland Kitchen NASAL POLYP EXCISION    . PARTIAL GLOSSECTOMY  01/15/2018  . POSTERIOR LUMBAR FUSION 4 LEVEL N/A 08/07/2019   Procedure: Thoracic Ten to pelvis fixation;  Surgeon: Erline Levine, MD;  Location: [place];  Service: Neurosurgery;  Laterality: N/A;  Thoracic Ten to pelvis fixation  . SKIN CANCER EXCISION Left 11/2017   "thigh"  . TONGUE SURGERY    . TONSILLECTOMY    . TUMMY TUCK     Social History:  reports that she has never smoked. She has never used smokeless tobacco. She reports previous alcohol use. She reports that she does not use drugs.  Family / Support Systems Marital Status: Married How Long?: 76 years Patient Roles: Spouse, Parent Spouse/Significant Other: Clyde Children: [name] (daughter) [phone] Other Supports: None reported Anticipated Caregiver: husband and dtr Ability/Limitations of Caregiver: Wife reports husband is hard of hearing Caregiver Availability: 24/7 Family Dynamics: Pt lives with husband;both are retired. pt reports very active lifestyle prior to admission.  Social History Preferred language: English Religion: Non-Denominational Cultural Background: Pt worked at [employer] for 20 yrs; and personal assistance for an attorney for about 15 yrs. Pt states she retired 3 years ago. Education: GED Read: Yes Write: Yes Employment Status: Retired Date Retired/Disabled/Unemployed: 2018 Age Retired: 89 Public relations account executive Issues: Denies Guardian/Conservator: N/A   Abuse/Neglect Abuse/Neglect Assessment Can Be Completed: Yes Physical Abuse: Denies Verbal Abuse: Denies Sexual Abuse: Denies Exploitation of patient/patient's resources: Denies Self-Neglect: Denies  Emotional Status Pt's affect, behavior and adjustment status: Pt in good spirits despite being tired from therapy prior to meeting with SW. Recent Psychosocial Issues: Denies Psychiatric History:  Denies Substance Abuse History: Denies  Patient / Family Perceptions, Expectations & Goals Pt/Family understanding of illness & functional limitations: Pt and dtr have a general understanding of care needs Premorbid pt/family roles/activities: Independent Anticipated changes in roles/activities/participation: Assistance with ADLs/IADLs Pt/family expectations/goals: Pt goal is to walk more.  Community Resources [employer] Scripts: None Premorbid Home Care/DME Agencies: None Transportation available at discharge: Daughter Resource referrals recommended: Neuropsychology  Discharge Planning Living Arrangements: Spouse/significant other, Children Support Systems: Spouse/significant other, Children Type of Residence: Private residence Insurance underwriter Resources: [employer], Multimedia programmer (specify) Nurse, mental health) Financial Resources: [place] Referred: No Living Expenses: Own Money Management: Patient Does the patient have any problems obtaining your medications?: No Home Management: Pt did all housekeeping. Patient/Family Preliminary Plans: Pt will continue to manage finances. Will need assitance with housekeeping. Care Coordinator Anticipated Follow Up Needs: HH/OP  Clinical Impression  SW met with pt in room at bedside to introduce self, explain role, and discuss discharge process. Pt states HCPOA is her husband and if not him, their dtr Christy. Pt is not a English as a second language teacher. DME: cane (states has used in community for PRN).   SW received return phone call from pt dtr [name] [phone]) to discuss discharge plan.  States she is able to work from home and is able to provide as much support to mother as she needs at discharge. SW informed will follow-up once there is more information on pt d/c plan.   Jeweline Reif A Jenalee Trevizo 08/11/2019, 3:55 PM

## 2019-08-11 NOTE — Evaluation (Signed)
Physical Therapy Assessment and Plan  Patient Details  Name: Stacy Wagner MRN: [id number] Date of Birth: April 22, 1940  PT Diagnosis: Difficulty walking, Low back pain and Muscle weakness Rehab Potential: Good ELOS: 10-14 days   Today's Date: 08/11/2019 PT Individual Time: 1062-6948 and 5462-7035 PT Individual Time Calculation (min): 74 min  And 34 min  Problem List:  Patient Active Problem List   Diagnosis Date Noted  . Scoliosis of lumbosacral spine 08/10/2019  . Idiopathic scoliosis of thoracolumbar region 08/04/2019  . Colon adenomas 10/21/2018  . Cancer of lateral margin of anterior two-thirds of tongue ([place]) 01/15/2018  . Bursitis of left shoulder 08/10/2015  . Postmenopausal 10/15/2014  . Dizziness and giddiness 08/13/2014  . Nausea without vomiting 08/13/2014  . GERD (gastroesophageal reflux disease) 10/08/2013  . Obesity (BMI 30-39.9) 10/01/2012  . VITAMIN D DEFICIENCY 09/08/2009  . Hyperlipidemia 09/08/2009  . FREQUENCY, URINARY 09/08/2009  . UNS ADVRS EFF OTH RX MEDICINAL&BIOLOGICAL SBSTNC 09/08/2009  . INSOMNIA 08/25/2008  . WEIGHT GAIN 08/25/2008  . DEGENERATIVE JOINT DISEASE, CERVICAL SPINE 07/15/2008  . Essential hypertension 06/28/2008  . OSTEOARTHRITIS 06/28/2008  . NECK PAIN 06/28/2008  . Hypothyroidism 08/20/2007  . LABYRINTHITIS, CHRONIC 08/20/2007  . EDEMA 08/20/2007  . HEADACHE 08/20/2007  . SYNDROME, CARPAL TUNNEL 08/15/2006    Past Medical History:  Past Medical History:  Diagnosis Date  . AC (acromioclavicular) joint bone spurs   . Arthritis    "lower back" (01/15/2018)  . Back pain   . Bursitis disorder    bil shoulders  . Cataracts, bilateral   . Chronic lower back pain   . Complication of anesthesia    some nausea in past with anesthesia, pt. also recalls having the "tube removed too early & her her throat was full of mucous, states that she could move any part of her body)  . Family history of adverse reaction to anesthesia     "daughter gets PONV" (01/15/2018)  . GERD (gastroesophageal reflux disease)   . Hearing loss    bil / no hearing aids  . Hypertension   . Hypothyroidism   . Idiopathic scoliosis   . Leg pain   . Lumbar radiculopathy   . Lumbar stenosis with neurogenic claudication   . Migraine    "used to have them all the time; none in 10 yrs" (01/15/2018)  . Mouth sores   . Neck pain   . OA (osteoarthritis)    Multiple injections in lumbar region   . PONV (postoperative nausea and vomiting)   . Prolapse of female bladder, acquired   . Sagittal plane imbalance   . Skin cancer 11/2017   "left thigh"  . Thyroid disease   . Tongue cancer ([place])    12-2017   Past Surgical History:  Past Surgical History:  Procedure Laterality Date  . ABDOMINAL EXPOSURE N/A 08/04/2019   Procedure: ABDOMINAL EXPOSURE;  Surgeon: Angelia Mould, MD;  Location: [facility] OR;  Service: Vascular;  Laterality: N/A;  . ABDOMINAL HYSTERECTOMY     uterine prolapse  . ABDOMINOPLASTY     tummy tuck Emilio Aspen excess skin arms and legs  . ANTERIOR LATERAL LUMBAR FUSION 4 LEVELS Right 08/04/2019   Procedure: Lumbar One-Two, Lumbar Two-Three, Lumbar Three-Four, Lumbar Four-Five  Anterolateral Lumbar Interbody Fusion;  Surgeon: Erline Levine, MD;  Location: [place];  Service: Neurosurgery;  Laterality: Right;  . ANTERIOR LUMBAR FUSION N/A 08/04/2019   Procedure: Lumbar Five - Sacral One  Anterior Lumbar Interbody Fusion;  Surgeon: Erline Levine, MD;  Location: [place] OR;  Service: Neurosurgery;  Laterality: N/A;  Dr Deitra Mayo to assist  . APPENDECTOMY    . BACK SURGERY    . BREAST SURGERY Right 1980   removal of a nodule- post trauma, benign pathology  . CARPAL TUNNEL RELEASE Right   . CATARACT EXTRACTION, BILATERAL    . COLONOSCOPY    . GLOSSECTOMY N/A 01/15/2018   Procedure: PARTIAL GLOSSECTOMY;  Surgeon: Rozetta Nunnery, MD;  Location: [facility] OR;  Service: ENT;  Laterality: N/A;  . LUMBAR SPINE SURGERY  1988   /w Dr.  Joya Salm- reports that it was in the lumbar region, unsure of date.  Marland Kitchen NASAL POLYP EXCISION    . PARTIAL GLOSSECTOMY  01/15/2018  . SKIN CANCER EXCISION Left 11/2017   "thigh"  . TONGUE SURGERY    . TONSILLECTOMY    . TUMMY TUCK      Assessment & Plan Clinical Impression: Patient is a 79 year old female with history of multiple back surgeries with progressive back pain due to lumbar DDD with scoliosis and imbalance. She was admitted on 08/04/19 for 2 staged procedure--she underwent anterior retroperitoneal exposure L5-S1 by Dr. Scot Dock and underwent L5-S1 fusion and L1-L5 anterolateral fusion on 06/08 and posterolateral T10-pelvis fusion on 06/11. Post op has had lot of pain with neuropathy-IV decadron added 6/10 and gabapentin being titrated upwards.  Lumbar drain in place with 330 ml serosanguinous drainage yesterday. Patient transferred to CIR on 08/10/2019 .   Patient currently requires mod with mobility secondary to muscle weakness, decreased cardiorespiratoy endurance and decreased sitting balance, decreased standing balance and decreased balance strategies.  Prior to hospitalization, patient was independent  with mobility and lived with Spouse in a House home.  Home access is 8 in back, 4 in front no rails .  Patient will benefit from skilled PT intervention to maximize safe functional mobility, minimize fall risk and decrease caregiver burden for planned discharge home with intermittent assist.  Anticipate patient will benefit from follow up [facility] at discharge.  PT - End of Session Activity Tolerance: Tolerates 30+ min activity with multiple rests Endurance Deficit: Yes Endurance Deficit Description: endurance impacted by pain, especially in RLE PT Assessment Rehab Potential (ACUTE/IP ONLY): Good PT Barriers to Discharge: Home environment access/layout PT Barriers to Discharge Comments: 8 STE with BHRs PT Patient demonstrates impairments in the following area(s):  Balance;Endurance;Motor;Pain;Safety PT Transfers Functional Problem(s): Bed Mobility;Bed to Chair;Car PT Locomotion Functional Problem(s): Ambulation;Stairs PT Plan PT Intensity: Minimum of 1-2 x/day ,45 to 90 minutes PT Frequency: 5 out of 7 days PT Treatment/Interventions: Ambulation/gait training;Community reintegration;DME/adaptive equipment instruction;Neuromuscular re-education;Stair training;Psychosocial support;UE/LE Strength taining/ROM;Balance/vestibular training;Discharge planning;Functional electrical stimulation;Pain management;Therapeutic Activities;UE/LE Coordination activities;Cognitive remediation/compensation;Disease management/prevention;Functional mobility training;Patient/family education;Therapeutic Exercise;Visual/perceptual remediation/compensation PT Transfers Anticipated Outcome(s): Supervision PT Locomotion Anticipated Outcome(s): Supervision PT Recommendation Follow Up Recommendations: 24 hour supervision/assistance;Home health PT Equipment Recommended: To be determined  Skilled Therapeutic Intervention  Evaluation completed (see details above and below) with education on PT POC and goals and individual treatment initiated with focus on bed mobility, maintaining spinal precautions, transfers, balance, ambulation, and initiation of stair training.  1st Session: Pt received supine and agreeable to therapy. Reports intense pain in RLE in sciatic nerve distribution. Number not provided. PT provides rest breaks and repositioning as needed to address pain symptoms. Supine to sit with modA using logroll technique with verbal cues for technique and sequencing, tactile cuing at trunk and BLEs for assistance. Pt sits at EOB and PT provides totalA to done TLSO. Stand  pivot transfer to [facility] with HHA and modA with cues for hand placement, positioning, and body mechanics. WC transport to therapy gym for energy conservation. Multiple reps of sit to stand throughout session, requiring modA  for anterior trunk lean and support. Pt performs 25' ambulation with RW and CGA, with slight buckle in RLE occasionally but no LOBs. Pt performs car transfer with stand pivot technique and HHA modA. Following, pt performs x5 3" steps with BHRs and minA from PT. PT provides visual demonstration and verbal education on stair training technique. Stand step transfer back to bed and modA for sit to supine using logrolling technique. Pt left supine in bed with alarm intact and all needs within reach.  2nd Session: Pt received seated in WC and agreeable to therapy. Reports ongoing pain in RLE, number not provided. PT provides rest breaks as needed to manage pain symptoms. WC transport to gym for time management. Pt ambulates up and down ramp with RW and minA due to x1 posterior LOB. Pt verbalizes fatigue and requesting limited mobility for remainder of session secondary to fatigue and back pain. Pt performs x7 minutes on UE ergometer to encourage upright posture and activity tolerance. Pt performs stand pivot transfer to toilet with modA. Stand step transfer back to bed with RW and modA for sit to supine using logrolling technique. Pt left supine in bed with alarm intact and all needs within reach.  PT Evaluation Precautions/Restrictions Precautions Precautions: Back;Fall Required Braces or Orthoses: Spinal Brace Spinal Brace: Thoracolumbosacral orthotic;Applied in sitting position Restrictions Weight Bearing Restrictions: No General Chart Reviewed: Yes Family/Caregiver Present: No Vital Signs  Pain Pain Assessment Pain Scale: 0-10 Pain Score: 3  Pain Type: Acute pain Pain Location: Leg Pain Orientation: Right Pain Descriptors / Indicators: Shooting Pain Onset: Gradual Pain Intervention(s): Medication (See eMAR) (prior to therapy) Home Living/Prior Functioning Home Living Available Help at Discharge: Family;Available 24 hours/day Type of Home: House Home Access: Stairs to enter [employer] of Steps: 8 in back, 4 in front no rails Entrance Stairs-Rails: Can reach both Home Layout: One level  Lives With: Spouse Prior Function Level of Independence: Independent with basic ADLs;Independent with homemaking with ambulation;Independent with gait  Able to Take Stairs?: Yes Vision/Perception  Perception Perception: Within Functional Limits Praxis Praxis: Intact  Cognition Overall Cognitive Status: Within Functional Limits for tasks assessed Arousal/Alertness: Awake/alert Orientation Level: Oriented X4 Safety/Judgment: Appears intact Sensation Sensation Light Touch: Appears Intact Additional Comments: Light touch illicited pain in RLE sciatic distribution Coordination Gross Motor Movements are Fluid and Coordinated: Yes Fine Motor Movements are Fluid and Coordinated: Yes Motor  Motor Motor: Within Functional Limits Motor - Skilled Clinical Observations: generalized limitations due to pain  Mobility Bed Mobility Bed Mobility: Rolling Right;Supine to Sit;Sit to Supine Rolling Right: Minimal Assistance - Patient > 75% Supine to Sit: Moderate Assistance - Patient 50-74% Sit to Supine: Moderate Assistance - Patient 50-74% Transfers Transfers: Sit to Stand;Stand to Sit;Stand Pivot Transfers Sit to Stand: Moderate Assistance - Patient 50-74% Stand to Sit: Minimal Assistance - Patient > 75% Stand Pivot Transfers: Moderate Assistance - Patient 50 - 74% Stand Pivot Transfer Details: Verbal cues for technique;Tactile cues for initiation;Tactile cues for sequencing;Tactile cues for posture;Verbal cues for sequencing Transfer (Assistive device): 1 person hand held assist Locomotion  Gait Ambulation: Yes Gait Assistance: Contact Guard/Touching assist Gait Distance (Feet): 25 Feet Assistive device: Rolling walker Gait Gait: Yes Gait Pattern: Impaired Gait Pattern: Shuffle Gait velocity: very slow Stairs / Additional Locomotion Stairs: Yes  Stairs Assistance:  Minimal Assistance - Patient > 75% Stair Management Technique: Two rails Number of Stairs: 5 Height of Stairs: 3 Ramp: Minimal Assistance - Patient >75% Curb: Minimal Assistance - Patient >75%  Trunk/Postural Assessment  Cervical Assessment Cervical Assessment: Exceptions to [facility] (forward head) Thoracic Assessment Thoracic Assessment: Exceptions to [facility] (rounded shoulders) Lumbar Assessment Lumbar Assessment: Exceptions to [facility] (posterior pelvic tilt in sitting) Postural Control Postural Control: Within Functional Limits  Balance Balance Balance Assessed: Yes Static Sitting Balance Static Sitting - Level of Assistance: 5: Stand by assistance Dynamic Sitting Balance Dynamic Sitting - Level of Assistance: 4: Min assist Static Standing Balance Static Standing - Level of Assistance: 5: Stand by assistance Dynamic Standing Balance Dynamic Standing - Level of Assistance: 4: Min assist Extremity Assessment  RLE Assessment RLE Assessment: Within Functional Limits General Strength Comments: Hip Flexion and Knee Extension 2/5 secondary to pain. PF/DF 4/5 LLE Assessment LLE Assessment: Within Functional Limits General Strength Comments: Grossly 4+/5    Refer to Care Plan for Long Term Goals  Recommendations for other services: None   Discharge Criteria: Patient will be discharged from PT if patient refuses treatment 3 consecutive times without medical reason, if treatment goals not met, if there is a change in medical status, if patient makes no progress towards goals or if patient is discharged from hospital.  The above assessment, treatment plan, treatment alternatives and goals were discussed and mutually agreed upon: by patient  Breck Coons, PT, DPT 08/11/2019, 4:04 PM

## 2019-08-11 NOTE — Progress Notes (Signed)
Bowel program started about 1850. Dig stim performed prior to suppository insertion. Rectal vault clear. At 1930 dig stim performed again. Rectal vault completely clear. Bowel sounds active, no abdominal pain reported. Report passed on to next shift.

## 2019-08-11 NOTE — Progress Notes (Signed)
Occupational Therapy Session Note  Patient Details  Name: Stacy Wagner MRN: [id number] Date of Birth: 1940/12/28  Today's Date: 08/11/2019 OT Individual Time: 8166-1969 OT Individual Time Calculation (min): 53 min    Short Term Goals: Week 1:  OT Short Term Goal 1 (Week 1): Pt will be able to pull pants down with min A in standing to prep for toileting. OT Short Term Goal 2 (Week 1): Using a toilet aid, pt will be able to self cleanse with min A. OT Short Term Goal 3 (Week 1): pt will don socks with sock aid with supervision. OT Short Term Goal 4 (Week 1): Pt will don pants over feet with reacher with min A.  Skilled Therapeutic Interventions/Progress Updates:  Pt greeted at time of session sitting up in wheelchair, agreeable to OT session. No pain in resting but did have sciatic pain with movement/touch in RLE. Therapist provided sensory stimulation for RLE prior to standing and going to the bathroom for soft touch initially and more rough texture to desensitize RLE, able to progress to knee joint. Pt then set up at toilet from wheelchair level, stand pivot to toilet with Min A with use of grab bar, max A for donning/doffing clothing but pt did help with unilateral support. Pt did require extended time to complete tasks and does have some fear of falling. Cues for upright standing to decrease flexed position, and reviewed back precautions. Pt able to complete hygiene in front, no BM but did review that she is not able to twist with her precautions. After toileting, pt performed hand washing and grooming at sink level, all with supervision from seated position. Pt's family in the room, discussed her CLOF and OT goals. Family is very agreeable and willing to help when needed. Pt up in chair with alarm on, call bell in reach and all needs met.    Therapy Documentation Precautions:  Precautions Precautions: Back, Fall Required Braces or Orthoses: Spinal Brace Spinal Brace:  Thoracolumbosacral orthotic, Applied in sitting position Restrictions Weight Bearing Restrictions: No    Therapy/Group: Individual Therapy  Viona Gilmore 08/11/2019, 3:13 PM

## 2019-08-11 NOTE — Progress Notes (Signed)
[facility] PHYSICAL MEDICINE & REHABILITATION PROGRESS NOTE   Subjective/Complaints: Mrs. Owensby is feeling very tired after her morning therapy session but she felt that it went well.  WBC is elevated, likely secondary to steroids. Dysesthesias are better controlled while she is at rest.    Objective:   No results found. Recent Labs    08/11/19 0545  WBC 17.3*  HGB 10.4*  HCT 31.4*  PLT 318   Recent Labs    08/11/19 0545  NA 139  K 4.4  CL 103  CO2 26  GLUCOSE 131*  BUN 17  CREATININE 0.66  CALCIUM 8.5*    Intake/Output Summary (Last 24 hours) at 08/11/2019 1052 Last data filed at 08/11/2019 0841 Gross per 24 hour  Intake 600 ml  Output 500 ml  Net 100 ml     Physical Exam: Vital Signs Blood pressure 133/82, pulse 84, temperature 97.7 F (36.5 C), resp. rate 17, height 5\' 8"  (1.727 m), weight 72 kg, SpO2 96 %. General: Alert and oriented x 3, No apparent distress HEENT: Head is normocephalic, atraumatic, PERRLA, EOMI, sclera anicteric, oral mucosa pink and moist, dentition intact, ext ear canals clear, +thrush Neck: Supple without JVD or lymphadenopathy Heart: Reg rate and rhythm. No murmurs rubs or gallops Chest: CTA bilaterally without wheezes, rales, or rhonchi; no distress Abdomen: Soft, non-tender, non-distended, bowel sounds positive. Extremities: No clubbing, cyanosis, or edema. Pulses are 2+ Skin: Honeycomb dressing along spinal incision without drainage. Incision sites in right iliac crest with skin glue, C/D/I Neuro: Pt is cognitively appropriate with normal insight, memory, and awareness. Cranial nerves 2-12 are intact. Sensory exam is normal. Fine motor coordination is intact. No tremors. Motor function is grossly 5/5 in bilateral upper extremities and LLE, but RLE HF and KE are severely limited by pain. DF/PF 5/5.  Psych: Pt's affect is appropriate. Pt is cooperative  Assessment/Plan: 1. Functional deficits secondary to impaired mobility and ADLs  secondary to L5-S1 anterior lumbar interbody fusion and L1-L5 anterolateral lumbar interbody fusion for stage 1 scoliosis, complicated by dysesthesias in RLE, which require 3+ hours per day of interdisciplinary therapy in a comprehensive inpatient rehab setting.  Physiatrist is providing close team supervision and 24 hour management of active medical problems listed below.  Physiatrist and rehab team continue to assess barriers to discharge/monitor patient progress toward functional and medical goals  Care Tool:  Bathing              Bathing assist       Upper Body Dressing/Undressing Upper body dressing        Upper body assist      Lower Body Dressing/Undressing Lower body dressing      What is the patient wearing?: Incontinence brief     Lower body assist Assist for lower body dressing: Moderate Assistance - Patient 50 - 74%     Toileting Toileting    Toileting assist Assist for toileting: Maximal Assistance - Patient 25 - 49%     Transfers Chair/bed transfer  Transfers assist           Locomotion Ambulation   Ambulation assist              Walk 10 feet activity   Assist           Walk 50 feet activity   Assist           Walk 150 feet activity   Assist  Walk 10 feet on uneven surface  activity   Assist           Wheelchair     Assist               Wheelchair 50 feet with 2 turns activity    Assist            Wheelchair 150 feet activity     Assist          Blood pressure 133/82, pulse 84, temperature 97.7 F (36.5 C), resp. rate 17, height 5\' 8"  (1.727 m), weight 72 kg, SpO2 96 %.  Medical Problem List and Plan: 1.  Impaired mobility and ADLs secondary to L5-S1 anterior lumbar interbody fusion and L1-L5 anterolateral lumbar interbody fusion for stage 1 scoliosis, complicated by dysesthesias in RLE.             -patient may shower but incision must be covered              -ELOS/Goals: modI 12-16 days  -Admit to CIR. Team conference today.  2.  Antithrombotics: -DVT/anticoagulation:  Pharmaceutical: Lovenox 3. Pain Management: Continue Oxycodone prn--d/c hydrodocone/hydromorphone. On valium and robaxin prn for muscle spasms.  4. Mood: LCSW to follow for evaluation and support.              -antipsychotic agents: N/A 5. Neuropsych: This patient is capable of making decisions on her own behalf. 6. Skin/Wound Care: Honeycomb dressing along spinal incision without drainage. Incision sites in right iliac crest with skin glue. Continue to monitor.  7. Fluids/Electrolytes/Nutrition: Monitor I/O.  8. HTN: Monitor BP tid--continue Maxzide. Labile- continue to monitor.  9. Neuropathy: Gabapentin increased to 400 mg tid on 06/14. Continues on 4mg  IV decadron every 8 hrs.              6/14: Gabapentin was increased to 400mg  TID as worst pain is currently the neuropathic pain in RLE. If well tolerated and pain persists, can consider increasing dose further.  10. Hypothyroid: Continue supplement.  11. Thrush: Start Nystatin swish and swallow. 12. Leukocytosis: 2/2 steroids. 17.3 on 6/15. Continue to monitor weekly.  13. Blood loss anemia: 10.4 on 6/15. Continue to monitor weekly. 14. Hypocalcemia: Ca 8.5 on 6/15. Started on calcium supplement. Monitor weekly.      LOS: 1 days A FACE TO FACE EVALUATION WAS PERFORMED  Clide Deutscher Yevonne Yokum 08/11/2019, 10:52 AM

## 2019-08-12 ENCOUNTER — Inpatient Hospital Stay (HOSPITAL_COMMUNITY): Payer: Medicare Other

## 2019-08-12 ENCOUNTER — Inpatient Hospital Stay (HOSPITAL_COMMUNITY): Payer: Medicare Other | Admitting: Occupational Therapy

## 2019-08-12 NOTE — Progress Notes (Signed)
[place] [facility] PHYSICAL MEDICINE & REHABILITATION PROGRESS NOTE   Subjective/Complaints: Stacy Wagner is up sitting at the edge of bed. She feels fatigued but is motivated to improve. Continues to have severe right lower extremity dysesthesias. Has Gabapentin 400mg  TID which she thinks provides some relief, but it is painful for her to even touch her lower extremity and don pants.   ROS: +dysesthesias, constipation, thrush -fever, chills  Objective:   No results found. Recent Labs    08/11/19 0545  WBC 17.3*  HGB 10.4*  HCT 31.4*  PLT 318   Recent Labs    08/11/19 0545  NA 139  K 4.4  CL 103  CO2 26  GLUCOSE 131*  BUN 17  CREATININE 0.66  CALCIUM 8.5*    Intake/Output Summary (Last 24 hours) at 08/12/2019 4496 Last data filed at 08/12/2019 0737 Gross per 24 hour  Intake 737 ml  Output 1150 ml  Net -413 ml     Physical Exam: Vital Signs Blood pressure 126/73, pulse 76, temperature 98.6 F (37 C), resp. rate 17, height 5\' 8"  (1.727 m), weight 72 kg, SpO2 97 %. General: Alert and oriented x 3, No apparent distress HEENT: Head is normocephalic, atraumatic, PERRLA, EOMI, sclera anicteric, oral mucosa pink and moist, dentition intact, ext ear canals clear, +thrush Neck: Supple without JVD or lymphadenopathy Heart: Reg rate and rhythm. No murmurs rubs or gallops Chest: CTA bilaterally without wheezes, rales, or rhonchi; no distress Abdomen: Soft, non-tender, non-distended, bowel sounds positive. Extremities: No clubbing, cyanosis, or edema. Pulses are 2+ Skin: Honeycomb dressing along spinal incision without drainage. Incision sites in right iliac crest with skin glue, C/D/I Neuro: Pt is cognitively appropriate with normal insight, memory, and awareness. Cranial nerves 2-12 are intact. Sensory exam is normal. Fine motor coordination is intact. No tremors. Motor function is grossly 5/5 in bilateral upper extremities and LLE, but RLE HF and KE are severely limited by pain.  DF/PF 5/5. RLE tender to even light touch.  Psych: Pt's affect is appropriate. Pt is cooperative  Assessment/Plan: 1. Functional deficits secondary to impaired mobility and ADLs secondary to L5-S1 anterior lumbar interbody fusion and L1-L5 anterolateral lumbar interbody fusion for stage 1 scoliosis, complicated by dysesthesias in RLE, which require 3+ hours per day of interdisciplinary therapy in a comprehensive inpatient rehab setting.  Physiatrist is providing close team supervision and 24 hour management of active medical problems listed below.  Physiatrist and rehab team continue to assess barriers to discharge/monitor patient progress toward functional and medical goals  Care Tool:  Bathing    Body parts bathed by patient: Right arm, Left arm, Chest, Abdomen, Face, Right upper leg, Left upper leg, Front perineal area   Body parts bathed by helper: Right lower leg, Left lower leg, Buttocks     Bathing assist Assist Level: Moderate Assistance - Patient 50 - 74%     Upper Body Dressing/Undressing Upper body dressing   What is the patient wearing?: Pull over shirt, Orthosis Orthosis activity level: Performed by helper  Upper body assist Assist Level: Set up assist    Lower Body Dressing/Undressing Lower body dressing      What is the patient wearing?: Incontinence brief     Lower body assist Assist for lower body dressing: Moderate Assistance - Patient 50 - 74%     Toileting Toileting    Toileting assist Assist for toileting: Maximal Assistance - Patient 25 - 49%     Transfers Chair/bed transfer  Transfers assist  Chair/bed transfer assist level: Moderate Assistance - Patient 50 - 74%     Locomotion Ambulation   Ambulation assist      Assist level: Contact Guard/Touching assist Assistive device: Walker-rolling Max distance: 25'   Walk 10 feet activity   Assist     Assist level: Contact Guard/Touching assist Assistive device: Walker-rolling    Walk 50 feet activity   Assist Walk 50 feet with 2 turns activity did not occur: Safety/medical concerns         Walk 150 feet activity   Assist Walk 150 feet activity did not occur: Safety/medical concerns         Walk 10 feet on uneven surface  activity   Assist     Assist level: Minimal Assistance - Patient > 75% Assistive device: Aeronautical engineer Will patient use wheelchair at discharge?: No             Wheelchair 50 feet with 2 turns activity    Assist            Wheelchair 150 feet activity     Assist          Blood pressure 126/73, pulse 76, temperature 98.6 F (37 C), resp. rate 17, height 5\' 8"  (1.727 m), weight 72 kg, SpO2 97 %.  Medical Problem List and Plan: 1.  Impaired mobility and ADLs secondary to L5-S1 anterior lumbar interbody fusion and L1-L5 anterolateral lumbar interbody fusion for stage 1 scoliosis, complicated by dysesthesias in RLE.             -patient may shower but incision must be covered             -ELOS/Goals: modI 12-16 days  -Continue CIR therapies. 2.  Antithrombotics: -DVT/anticoagulation:  Pharmaceutical: Lovenox 3. Pain Management: Continue Oxycodone prn--d/c hydrodocone/hydromorphone. On valium and robaxin prn for muscle spasms.   6/16: Complaining of painful muscle spasms this morning. Valium was discontinued, can consider restarting, but prefer use of less sedating Methocarbamol at this time. Encourage patient to ask if she needs.  4. Mood: LCSW to follow for evaluation and support.              -antipsychotic agents: N/A 5. Neuropsych: This patient is capable of making decisions on her own behalf. 6. Skin/Wound Care: Honeycomb dressing along spinal incision without drainage. Incision sites in right iliac crest with skin glue. Continue to monitor.  7. Fluids/Electrolytes/Nutrition: Monitor I/O.  8. HTN: Monitor BP tid--continue Maxzide. Labile- continue to monitor.  9.  Neuropathy: Gabapentin increased to 400 mg tid on 06/14. Continues on 4mg  IV decadron every 8 hrs.              6/14: Gabapentin was increased to 400mg  TID as worst pain is currently the neuropathic pain in RLE. If well tolerated and pain persists, can consider increasing dose further.  10. Hypothyroid: Continue supplement.  11. Thrush: Continue Nystatin swish and swallow. Improving.  12. Leukocytosis: 2/2 steroids. 17.3 on 6/15. Continue to monitor weekly.  13. Blood loss anemia: 10.4 on 6/15. Continue to monitor weekly. 14. Hypocalcemia: Ca 8.5 on 6/15. Started on calcium supplement. Monitor weekly.   15. Constipation: Enema today.    LOS: 2 days A FACE TO FACE EVALUATION WAS PERFORMED  Stacy Wagner P Ellysia Char 08/12/2019, 9:22 AM

## 2019-08-12 NOTE — Progress Notes (Signed)
Patient was given suppository and digitally stimulated and patient is passing gas and there were two small pieces of stool noted. Patient states that she feels like something is coming very soon.

## 2019-08-12 NOTE — Progress Notes (Signed)
Occupational Therapy Session Note  Patient Details  Name: Stacy Wagner MRN: [id number] Date of Birth: August 01, 1940  Today's Date: 08/12/2019 OT Individual Time: 2549-8264 and 1583-0940 OT Individual Time Calculation (min): 39 min and 65 min   Short Term Goals: Week 1:  OT Short Term Goal 1 (Week 1): Pt will be able to pull pants down with min A in standing to prep for toileting. OT Short Term Goal 2 (Week 1): Using a toilet aid, pt will be able to self cleanse with min A. OT Short Term Goal 3 (Week 1): pt will don socks with sock aid with supervision. OT Short Term Goal 4 (Week 1): Pt will don pants over feet with reacher with min A.  Skilled Therapeutic Interventions/Progress Updates:    Pt greeted at time of session sitting up in wheelchair agreeable to work on toileting prior to lunch, but wanted pain meds prior to performing toileting tasks. Sensory input provided for RLE beginning distally at feet and progressing up to knee to decrease pain. Once medications given by nursing, pt set up at toilet with BSC over toilet and performed SPT <> Min A with extended time, pain in RLE limiting and says knees "feel like they might buckle." Pt voided urine, no BM. Pt ed on keeping head upright and not looking down to promote good posture. Reviewed use of reacher to compensate for back precautions to reach her pants to pull up, donned new brief and pants with Max A while pt static standing. Requires extended time for tasks. Once back up in chair, hand hygiene at sink level supervision and pt left up in chair with alarm on, call bell in reach, all needs met.  Session 2: Pt greeted at time of session sitting up in wheelchair with family in room, agreeable to OT session to work on toileting, balance, transfers. Nursing required assist getting pt to/from toilet for enema. Pt set up at toilet and performed transfers to/from with Min A and cues to fully stand up straight given her tendency for a flexed  posture, precautions reviewed. Unable to have BM after enema, nursing notified. Mod-Max A for clothing management in standing as pt did assist with donning with one hand support. Pt expressed that she wanted to go back to bed, stand pivot w/c > bed with RW Min A and therapist doffed TLSO, family present for demonstration. HOB elevated and pt placed in supine Mod A to manage BLEs (fear of pain limiting/causing some rigidity) with log rolling for back precautions. Pt thought she may have had a BM, rolled L and R with Min/Mod A to perform brief check and doff pants at bed level. Cues provided throughout for maintaining precautions and family ed initiated with daughter and husband regarding care at home. Nursing notified pt may have BM soon. Pt in bed with alarm on, call bell in reach, all needs met.   Therapy Documentation Precautions:  Precautions Precautions: Back, Fall Required Braces or Orthoses: Spinal Brace Spinal Brace: Thoracolumbosacral orthotic, Applied in sitting position Restrictions Weight Bearing Restrictions: No   Therapy/Group: Individual Therapy  Viona Gilmore 08/12/2019, 12:14 PM

## 2019-08-12 NOTE — Progress Notes (Signed)
Physical Therapy Session Note  Patient Details  Name: Stacy Wagner MRN: [id number] Date of Birth: March 28, 1940  Today's Date: 08/12/2019 PT Individual Time: 0803-0901 and 1002-1041 PT Individual Time Calculation (min): 58 min and 39 min  Short Term Goals: Week 1:  PT Short Term Goal 1 (Week 1): Pt will perform supine<>sit with minA. PT Short Term Goal 2 (Week 1): Pt will perform bed to chair with minA. PT Short Term Goal 3 (Week 1): Pt will ambulates 7' with CGA. PT Short Term Goal 4 (Week 1): Pt will perform 4 steps with CGA.  Skilled Therapeutic Interventions/Progress Updates:     1st session: Pt received supine in process of taking morning meds from RN. Pt reports she had a hard night with unrelenting pain in RLE. Pain currently sharp in nature and also sensitive to tough, even with bed sheets and loose pants. Pt agreeable to therapy and PT provides rest breaks and repositioning as needed to manage pain symptoms.   Supine to sit to the L with modA and PT managing RLE, with HOB fully elevated. PT provides totalA to don TLSO at EOB and cues pt for breathing exercises to prevent pain exacerbation. Pt performs stand pivot transfer to [facility] with minA and cues for body mechanics and hand placement. WC transport to therapy gym for time management. Pt performs sit to stand in // bars with minA and remains standing ~5 minutes with CGA, focusing on improving posture with tactile cues at hips and trunk for increased extension, decreasing WB through BUEs for improved body mechanics and energy conservation, and extending BLEs for improved balance. Pt then performs side stepping 5' in both directions holding onto // bar facing mirror for visual feedback. Pt verbalizes significant fatigue following sidestepping. MinA for return to sitting. WC transport back to room and pt left seated in [facility] with alarm intact and all needs within reach.   2nd Session: Pt received sleeping in [place]. Easily roused and agreeable  to therapy. Reports pain in RLE, spasming in nature, but not as bad as earlier session. PT provides rest breaks and AAROM to manage pain symptoms. WC transport to dayroom for time management. Pt performs sit to stand with minA at trunk and cues for hand placement. Pt ambulates 115' with RW and minA from PT primarily to assist with RW propulsion to encourage slightly increased gait speed and more functional gait pattern to decrease risk for falls. Pt has x1 slight LOB posteriorly but otherwise just ambulates extremely slowly with forward flexed posture and bilateral shortened stride lengths. When PT facilitates trunk extension pt's knees both flex slightly and pt verbalizes feeling increased unsteadiness in knees.   Pt performs Nustep for activity tolerance training and to to provide gentle AAROM to BLEs for pain relief. Pt performs at workload of 2 for 10 minutes. Reports soreness in RLE but not spasming pain. Pt transfers back to [facility] with minA. WC transport back to room and pt left seated with alarm intact and all needs within reach.  Therapy Documentation Precautions:  Precautions Precautions: Back, Fall Required Braces or Orthoses: Spinal Brace Spinal Brace: Thoracolumbosacral orthotic, Applied in sitting position Restrictions Weight Bearing Restrictions: No   Therapy/Group: Individual Therapy  Breck Coons, PT, DPT 08/12/2019, 2:08 PM

## 2019-08-12 NOTE — Progress Notes (Signed)
Patient ID: Stacy Wagner, female   DOB: 1940/12/10, 79 y.o.   MRN: [id number]  SW followed up with pt dtr Stacy Wagner [phone]) to provide updates on ELOS and will follow-up with more information after team conference.   [name], MSW, [place] Office: [phone] Cell: [phone] Fax: [phone]

## 2019-08-12 NOTE — Progress Notes (Signed)
Patient was given a soap suds enema while the physical therapist helped assist her to the toilet.  As of yet, the patient hasn't had any results.

## 2019-08-12 NOTE — Progress Notes (Signed)
[facility] Individual Statement of Services  Patient Name:  Stacy Wagner  Date:  08/12/2019  Welcome to the [facility].  Our goal is to provide you with an individualized program based on your diagnosis and situation, designed to meet your specific needs.  With this comprehensive rehabilitation program, you will be expected to participate in at least 3 hours of rehabilitation therapies Monday-Friday, with modified therapy programming on the weekends.  Your rehabilitation program will include the following services:  Physical Therapy (PT), Occupational Therapy (OT), Therapeutic Recreaction (TR), Psychology, Neuropsychology, Care Coordinator, Rehabilitation Medicine, Nutrition Services, Pharmacy Services and Other  Weekly team conferences will be held on Tuesdays to discuss your progress.  Your Inpatient Rehabilitation Care Coordinator will talk with you frequently to get your input and to update you on team discussions.  Team conferences with you and your family in attendance may also be held.  Expected length of stay: 10-14 days   Overall anticipated outcome: Supervision  Depending on your progress and recovery, your program may change. Your Inpatient Rehabilitation Care Coordinator will coordinate services and will keep you informed of any changes. Your Inpatient Rehabilitation Care Coordinator's name and contact numbers are listed  below.  The following services may also be recommended but are not provided by the [facility] will be made to provide these services after discharge if needed.  Arrangements include referral to agencies that provide these services.  Your insurance has been verified to be:  Medicare A/B  Your primary doctor is:  Programmer, applications  Pertinent information  will be shared with your doctor and your insurance company.  Inpatient Rehabilitation Care Coordinator:  Cathleen Corti [phone] or [phone]  Information discussed with and copy given to patient by: Rana Snare, 08/12/2019, 11:02 AM

## 2019-08-13 ENCOUNTER — Inpatient Hospital Stay (HOSPITAL_COMMUNITY): Payer: Medicare Other | Admitting: Occupational Therapy

## 2019-08-13 ENCOUNTER — Inpatient Hospital Stay (HOSPITAL_COMMUNITY): Payer: Medicare Other

## 2019-08-13 MED ORDER — TROLAMINE SALICYLATE 10 % EX CREA: TOPICAL_CREAM | Freq: Two times a day (BID) | CUTANEOUS | Status: DC | PRN

## 2019-08-13 MED ORDER — AMITRIPTYLINE HCL 10 MG PO TABS
10.0000 mg | ORAL_TABLET | Freq: Every day | ORAL | Status: DC
  Administered 2019-08-13 – 2019-08-25 (×13): 10 mg via ORAL
  Filled 2019-08-13 (×13): qty 1

## 2019-08-13 MED ORDER — POLYETHYLENE GLYCOL 3350 17 G PO PACK
17.0000 g | PACK | Freq: Every day | ORAL | Status: DC
  Administered 2019-08-14 – 2019-08-17 (×4): 17 g via ORAL
  Filled 2019-08-13 (×3): qty 1

## 2019-08-13 MED ORDER — TRAMADOL HCL 50 MG PO TABS
50.0000 mg | ORAL_TABLET | Freq: Three times a day (TID) | ORAL | Status: DC
  Administered 2019-08-13 – 2019-08-26 (×51): 50 mg via ORAL
  Filled 2019-08-13 (×52): qty 1

## 2019-08-13 MED ORDER — MUSCLE RUB 10-15 % EX CREA
TOPICAL_CREAM | CUTANEOUS | Status: DC | PRN
  Filled 2019-08-13: qty 85

## 2019-08-13 MED ORDER — MINERAL OIL RE ENEM
1.0000 | ENEMA | Freq: Once | RECTAL | Status: DC
  Filled 2019-08-13: qty 1

## 2019-08-13 NOTE — Progress Notes (Signed)
Physical Therapy Session Note  Patient Details  Name: Stacy Wagner MRN: [id number] Date of Birth: Jun 30, 1940  Today's Date: 08/13/2019 PT Individual Time: 9458-5929 and 1135-1201 PT Individual Time Calculation (min): 25 min and 26 min  Short Term Goals: Week 1:  PT Short Term Goal 1 (Week 1): Pt will perform supine<>sit with minA. PT Short Term Goal 2 (Week 1): Pt will perform bed to chair with minA. PT Short Term Goal 3 (Week 1): Pt will ambulates 57' with CGA. PT Short Term Goal 4 (Week 1): Pt will perform 4 steps with CGA.  Skilled Therapeutic Interventions/Progress Updates:     1st Session: Pt received seated in [facility] and agreeable to therapy, though states she is very tired. Reports some pain in RLE but not nearly as bad as previous session. WC transport to dayroom for time management. Several sit to stands during session with RW and supervision with cues for hand placement and body mechaincs.Pt ambulates 160' with RW and  minA with PT providing assistance managing RW for more functional gait pattern and increased gait speed. Seated rest break. Pt then ambulates 300' with minA for x1 LOB backward, and otherwise ambulating with CGA. Pt has bilateral shortened stride lengths and forward flexed posture, requiring verbal cues to correct. Pt left seated in WC with alarm intact and all needs within reach.  2nd Session: Pt received seated in WC and agreeable to therapy. Continues to report feeling very tired and unable to keep eyes open. WC transport outside for mobility training in open environment on varying surfaces. Pt performs seated therex, including 1x10 BLE LAQs and seated marching. Pt requires AAROM for RLE due to pain and weakness. Sit to stand with cues for hand placement and pt performs standing marching 1x20 with CGA. WC transport back to room and pt left seated in [facility] with alarm intact and all needs within reach.  Therapy Documentation Precautions:  Precautions Precautions: Back,  Fall Required Braces or Orthoses: Spinal Brace Spinal Brace: Thoracolumbosacral orthotic, Applied in sitting position Restrictions Weight Bearing Restrictions: No    Therapy/Group: Individual Therapy  Breck Coons , PT, DPT 08/13/2019, 4:03 PM

## 2019-08-13 NOTE — IPOC Note (Signed)
Overall Plan of Care [facility]) Patient Details Name: Stacy Wagner MRN: [id number] DOB: 21-Aug-1940  Admitting Diagnosis: Scoliosis of lumbosacral spine  Hospital Problems: Principal Problem:   Scoliosis of lumbosacral spine     Functional Problem List: Nursing Bladder, Bowel, Pain, Skin Integrity  PT Balance, Endurance, Motor, Pain, Safety  OT Balance, Endurance, Motor, Pain, Sensory  SLP    TR         Basic ADL's: OT Bathing, Dressing, Toileting     Advanced  ADL's: OT       Transfers: PT Bed Mobility, Bed to Chair, Car  OT Toilet, Tub/Shower     Locomotion: PT Ambulation, Stairs     Additional Impairments: OT None  SLP        TR      Anticipated Outcomes Item Anticipated Outcome  Self Feeding no goal, pt is independent  Swallowing      Basic self-care  Supervision  Toileting  Supervision   Bathroom Transfers supervision to toilet, min A to shower  Bowel/Bladder  Patient will be continent of bowel and bladder prior to discharge.  Transfers  Supervision  Locomotion  Supervision  Communication     Cognition     Pain  Patient's pain will be < 2 at discharge.  Safety/Judgment  Patient will be able to perform ADL's and ambulate with minimal assistance and not be at a high risk of falling.   Therapy Plan: PT Intensity: Minimum of 1-2 x/day ,45 to 90 minutes PT Frequency: 5 out of 7 days PT Duration Estimated Length of Stay: 10-14 days OT Intensity: Minimum of 1-2 x/day, 45 to 90 minutes OT Frequency: 5 out of 7 days OT Duration/Estimated Length of Stay: 12-14 days     Due to the current state of emergency, patients may not be receiving their 3-hours of Medicare-mandated therapy.   Team Interventions: Nursing Interventions Patient/Family Education, Bowel Management, Pain Management, Skin Care/Wound Management, Bladder Management  PT interventions Ambulation/gait training, Community reintegration, DME/adaptive equipment instruction,  Neuromuscular re-education, Stair training, Psychosocial support, UE/LE Strength taining/ROM, Training and development officer, Discharge planning, Functional electrical stimulation, Pain management, Therapeutic Activities, UE/LE Coordination activities, Cognitive remediation/compensation, Disease management/prevention, Functional mobility training, Patient/family education, Therapeutic Exercise, Visual/perceptual remediation/compensation  OT Interventions Balance/vestibular training, Discharge planning, Functional mobility training, DME/adaptive equipment instruction, Pain management, Patient/family education, Self Care/advanced ADL retraining, Therapeutic Activities, Therapeutic Exercise, UE/LE Strength taining/ROM  SLP Interventions    TR Interventions    SW/CM Interventions Discharge Planning, Psychosocial Support, Patient/Family Education   Barriers to Discharge MD  Medical stability, Incontinence and Neurogenic bowel and bladder  Nursing Incontinence, Neurogenic Bowel & Bladder    PT Home environment access/layout 8 STE with BHRs  OT      SLP      SW       Team Discharge Planning: Destination: PT-  ,OT- Home , SLP-  Projected Follow-up: PT-24 hour supervision/assistance, Home health PT, OT-  Home health OT, SLP-  Projected Equipment Needs: PT-To be determined, OT- To be determined, SLP-  Equipment Details: PT- , OT-  Patient/family involved in discharge planning: PT- Patient,  OT-Patient, SLP-   MD ELOS: modI 12-16 days Medical Rehab Prognosis:  Excellent Assessment: Stacy Wagner is a 79 year old female who was admitted to CIR for impaired mobility and ADLs secondary to L5-S1 anterior lumbar interbody fusion and L1-L5 anterolateral lumbar interbody fusion for stage 1 scoliosis, complicated by dysesthesias in RLE. She is on Gabapentin 400mg  TID to assist with dysesthesias and prn  hydrocodone and valium for post-op pain and muscle spasms. Wound is healing appropriately. Labs are monitored  weekly to trend steroid induced leukocytosis, acute blood loss anemia, and hypocalcemia. Bowel program has been initiated to aid in constipation. Ritta Slot is being treated with Nystatin.      See Team Conference Notes for weekly updates to the plan of care

## 2019-08-13 NOTE — Plan of Care (Signed)
  Problem: Consults Goal: RH GENERAL PATIENT EDUCATION Description: Patient will be aware of their progress and know their expected goals and expected discharge date.    Outcome: Progressing Goal: Skin Care Protocol Initiated - if Braden Score 18 or less Description: If consults are not indicated, leave blank or document N/A Outcome: Progressing Goal: Nutrition Consult-if indicated Outcome: Progressing   Problem: RH BOWEL ELIMINATION Goal: RH STG MANAGE BOWEL WITH ASSISTANCE Description: STG Manage Bowel with mod Assistance. Outcome: Progressing Goal: RH STG MANAGE BOWEL W/MEDICATION W/ASSISTANCE Description: STG Manage Bowel with Medication with mod Assistance. Outcome: Progressing   Problem: RH BLADDER ELIMINATION Goal: RH STG MANAGE BLADDER WITH ASSISTANCE Description: Patient will be continent of bladder prior to discharge and able to empty bladder when urinating.  Outcome: Progressing Goal: RH STG MANAGE BLADDER WITH MEDICATION WITH ASSISTANCE Description: STG Manage Bladder With Medication With min Assistance. Outcome: Progressing   Problem: RH SKIN INTEGRITY Goal: RH STG SKIN FREE OF INFECTION/BREAKDOWN Description: Patient will remain free of skin tears and no new signs or symptoms of infection will appear.  Outcome: Progressing Goal: RH STG MAINTAIN SKIN INTEGRITY WITH ASSISTANCE Description: STG Maintain Skin Integrity With min Assistance. Outcome: Progressing Goal: RH STG ABLE TO PERFORM INCISION/WOUND CARE W/ASSISTANCE Description: STG Able To Perform Incision/Wound Care With min Assistance. Outcome: Progressing   Problem: RH SAFETY Goal: RH STG ADHERE TO SAFETY PRECAUTIONS W/ASSISTANCE/DEVICE Description: Patient will refrain from falls and ask for assistance as needed.  Outcome: Progressing Goal: RH STG DECREASED RISK OF FALL WITH ASSISTANCE Description: STG Decreased Risk of Fall With mod I Assistance. Outcome: Progressing   Problem: RH PAIN  MANAGEMENT Goal: RH STG PAIN MANAGED AT OR BELOW PT'S PAIN GOAL Description: Less than 3 out of 10 Outcome: Progressing   Problem: RH KNOWLEDGE DEFICIT GENERAL Goal: RH STG INCREASE KNOWLEDGE OF SELF CARE AFTER HOSPITALIZATION Description: Patient will know how to follow up with appointments and aware of how to take care of herself and prevent any unwanted hospital readmissions.  Outcome: Progressing

## 2019-08-13 NOTE — Progress Notes (Signed)
Occupational Therapy Session Note  Patient Details  Name: Stacy Wagner MRN: [id number] Date of Birth: [birth year]  Today's Date: 08/13/2019 OT Individual Time: 1451-1533 OT Individual Time Calculation (min): 42 min    Short Term Goals: Week 1:  OT Short Term Goal 1 (Week 1): Pt will be able to pull pants down with min A in standing to prep for toileting. OT Short Term Goal 2 (Week 1): Using a toilet aid, pt will be able to self cleanse with min A. OT Short Term Goal 3 (Week 1): pt will don socks with sock aid with supervision. OT Short Term Goal 4 (Week 1): Pt will don pants over feet with reacher with min A.  Skilled Therapeutic Interventions/Progress Updates:    Pt up in wheelchair to start session, reporting not sleeping well last night.  She also reports increased spasm type pain in her RLE that occurs without warning.  Noted during session as pt was sitting she would at times grimace and state that her leg was getting a sharp pain at that moment, but then it would go away.  This occurred a few times throughout.  Therapist took pt down via wheelchair to the ADL apartment.  She was educated and practiced simulated walk-in shower transfers with use of the RW for support and posterior to anterior method of transfer.  She was able to complete transfer with min assist overall, but did exhibit some difficulty with advancing the RLE over the edge of the simulated shower.  She has a corner seat at home which may be used, however therapist also discussed using the 3:1 in the shower to allow for greater height as well as arm rests to assist with sit to stand.  Returned to room via wheelchair after practice with discussion of options for shower transfer with pt's spouse and her daughter.  Pt's daughter with measure the shower as well as the height of the built in seat and bring in the measurements tomorrow.  Finished session with assisting pt to the bathroom with min assist using the RW.  She was left  on the 3:1 with family present and nursing made aware.  She was instructed on use of the call light in the bathroom as well.    Therapy Documentation Precautions:  Precautions Precautions: Back, Fall Precaution Comments: reviewed 3/3 back precautions Required Braces or Orthoses: Spinal Brace Spinal Brace: Thoracolumbosacral orthotic, Applied in sitting position Restrictions Weight Bearing Restrictions: No  Pain: Pain Assessment Pain Scale: Faces Faces Pain Scale: Hurts little more Pain Type: Surgical pain Pain Location: Leg Pain Orientation: Right Pain Descriptors / Indicators: Discomfort;Shooting Pain Onset: On-going Pain Intervention(s): Repositioned;Emotional support ADL: See Care Tool Section for some details of mobility and selfcare  Therapy/Group: Individual Therapy  Tzipporah Nagorski OTR/L 08/13/2019, 4:26 PM

## 2019-08-13 NOTE — Progress Notes (Signed)
Occupational Therapy Session Note  Patient Details  Name: Stacy Wagner MRN: [id number] Date of Birth: [birth year]  Today's Date: 08/13/2019 OT Individual Time: 9735-3299 OT Individual Time Calculation (min): 54 min    Short Term Goals: Week 1:  OT Short Term Goal 1 (Week 1): Pt will be able to pull pants down with min A in standing to prep for toileting. OT Short Term Goal 2 (Week 1): Using a toilet aid, pt will be able to self cleanse with min A. OT Short Term Goal 3 (Week 1): pt will don socks with sock aid with supervision. OT Short Term Goal 4 (Week 1): Pt will don pants over feet with reacher with min A.  Skilled Therapeutic Interventions/Progress Updates:    Pt greeted at time of session reclined in bed agreeable to OT session focused on morning routine/ADL session. HOB elevated and pt performed supine to sitting up at EOB with Mod A with assist to manage BLEs and bring UB to an upright position while maintaining precautions. Brief SPT to [facility] with use of RW where pt was Max A for donning/doffing LB clothing and brief d/t urgency, Min A for transfer primarily to help manage RW. Pt washed up while seated on BSC for UB/LB bathing with supervision for UB (except back), pt able to shave underarms and wash her UB. Therapist assist to wash BLEs thigh level and calves since the pt was performing bathing sitting upright without brace. Donned button up shirt with Min A with assist to bring around shoulders but pt able to button. Donned TLSO after pt had donned shirt, LB dressing Max A with education and training on use of reacher to thread pants, pt able to stand and provide some help donning over hips but still required Mod-Max A to don over hips.  Pt ambulated short distance to wheelchair with CGA-Min and once up in chair call bell in reach, alarm on, all needs met. Reviewed back precautions and cues provided throughout session for compensatory techniques to maintain precautions.   Therapy  Documentation Precautions:  Precautions Precautions: Back, Fall Required Braces or Orthoses: Spinal Brace Spinal Brace: Thoracolumbosacral orthotic, Applied in sitting position Restrictions Weight Bearing Restrictions: No    Therapy/Group: Individual Therapy  Viona Gilmore 08/13/2019, 10:42 AM

## 2019-08-13 NOTE — Progress Notes (Signed)
Patient had orders for soap suds enema. Patient tolerated well. Patient had a small BM but states "I feel as If I can go again". Reported to night shift nurse Amanda Cockayne, LPN

## 2019-08-13 NOTE — Progress Notes (Signed)
[place] PHYSICAL MEDICINE & REHABILITATION PROGRESS NOTE   Subjective/Complaints: Not sleeping at night. Became tearful because of her poor sleep and the severity of her pain at night.  Did not have any success with soap suds enema yesterday.   ROS: +dysesthesias, constipation, thrush -fever, chills  Objective:   No results found. Recent Labs    08/11/19 0545  WBC 17.3*  HGB 10.4*  HCT 31.4*  PLT 318   Recent Labs    08/11/19 0545  NA 139  K 4.4  CL 103  CO2 26  GLUCOSE 131*  BUN 17  CREATININE 0.66  CALCIUM 8.5*    Intake/Output Summary (Last 24 hours) at 08/13/2019 1500 Last data filed at 08/13/2019 1300 Gross per 24 hour  Intake 660 ml  Output --  Net 660 ml     Physical Exam: Vital Signs Blood pressure 131/64, pulse 71, temperature 98.9 F (37.2 C), temperature source Oral, resp. rate 18, height 5\' 8"  (1.727 m), weight 72 kg, SpO2 97 %. General: Alert and oriented x 3, No apparent distress HEENT: Head is normocephalic, atraumatic, PERRLA, EOMI, sclera anicteric, oral mucosa pink and moist, dentition intact, ext ear canals clear, +thrush Neck: Supple without JVD or lymphadenopathy Heart: Reg rate and rhythm. No murmurs rubs or gallops Chest: CTA bilaterally without wheezes, rales, or rhonchi; no distress Abdomen: Soft, non-tender, non-distended, bowel sounds positive. Extremities: No clubbing, cyanosis, or edema. Pulses are 2+ Skin: Honeycomb dressing along spinal incision without drainage. Incision sites in right iliac crest with skin glue, C/D/I Neuro: Pt is cognitively appropriate with normal insight, memory, and awareness. Cranial nerves 2-12 are intact. Sensory exam is normal. Fine motor coordination is intact. No tremors. Motor function is grossly 5/5 in bilateral upper extremities. LLE grossly 4+/5. RLE with HF and KE at 2/5 and DF/DF 4/5- pain limited. RLE tender to even light touch.  Psych: Pt's affect is appropriate. Pt is  cooperative  Assessment/Plan: 1. Functional deficits secondary to impaired mobility and ADLs secondary to L5-S1 anterior lumbar interbody fusion and L1-L5 anterolateral lumbar interbody fusion for stage 1 scoliosis, complicated by dysesthesias in RLE, which require 3+ hours per day of interdisciplinary therapy in a comprehensive inpatient rehab setting.  Physiatrist is providing close team supervision and 24 hour management of active medical problems listed below.  Physiatrist and rehab team continue to assess barriers to discharge/monitor patient progress toward functional and medical goals  Care Tool:  Bathing    Body parts bathed by patient: Right arm, Left arm, Chest, Abdomen, Face   Body parts bathed by helper: Right upper leg, Left upper leg, Buttocks     Bathing assist Assist Level: Moderate Assistance - Patient 50 - 74%     Upper Body Dressing/Undressing Upper body dressing   What is the patient wearing?: Button up shirt, Orthosis Orthosis activity level: Performed by helper (with pt assist to hold in place)  Upper body assist Assist Level: Minimal Assistance - Patient > 75% (Min A to help bring over shoulder for button up shirt to prevent twisting)    Lower Body Dressing/Undressing Lower body dressing      What is the patient wearing?: Incontinence brief, Pants     Lower body assist Assist for lower body dressing: Maximal Assistance - Patient 25 - 49%     Toileting Toileting    Toileting assist Assist for toileting: Maximal Assistance - Patient 25 - 49%     Transfers Chair/bed transfer  Transfers assist  Chair/bed transfer assist level: Minimal Assistance - Patient > 75%     Locomotion Ambulation   Ambulation assist      Assist level: Minimal Assistance - Patient > 75% Assistive device: Walker-rolling Max distance: 115'   Walk 10 feet activity   Assist     Assist level: Minimal Assistance - Patient > 75% Assistive device:  Walker-rolling   Walk 50 feet activity   Assist Walk 50 feet with 2 turns activity did not occur: Safety/medical concerns  Assist level: Minimal Assistance - Patient > 75% Assistive device: Walker-rolling    Walk 150 feet activity   Assist Walk 150 feet activity did not occur: Safety/medical concerns         Walk 10 feet on uneven surface  activity   Assist     Assist level: Minimal Assistance - Patient > 75% Assistive device: Aeronautical engineer Will patient use wheelchair at discharge?: No             Wheelchair 50 feet with 2 turns activity    Assist            Wheelchair 150 feet activity     Assist          Blood pressure 131/64, pulse 71, temperature 98.9 F (37.2 C), temperature source Oral, resp. rate 18, height 5\' 8"  (1.727 m), weight 72 kg, SpO2 97 %.  Medical Problem List and Plan: 1.  Impaired mobility and ADLs secondary to L5-S1 anterior lumbar interbody fusion and L1-L5 anterolateral lumbar interbody fusion for stage 1 scoliosis, complicated by dysesthesias in RLE.             -patient may shower but incision must be covered             -ELOS/Goals: modI 12-16 days  -Continue CIR therapies. 2.  Antithrombotics: -DVT/anticoagulation:  Pharmaceutical: Lovenox- d/ced since patient ambulated 300 feet 6/17 3. Pain Management: Continue Oxycodone prn--d/c hydrodocone/hydromorphone. On valium and robaxin prn for muscle spasms.   6/16: Complaining of painful muscle spasms this morning. Valium was discontinued, can consider restarting, but prefer use of less sedating Methocarbamol at this time. Encourage patient to ask if she needs.   6/17: patient tearful that pain is very sever at night and prevented her from sleeping. Started amitriptyline 10mg  at night to help with insomnia and neuropathic pain.  4. Mood: LCSW to follow for evaluation and support.              -antipsychotic agents: N/A 5. Neuropsych: This  patient is capable of making decisions on her own behalf. 6. Skin/Wound Care: Honeycomb dressing along spinal incision without drainage. Incision sites in right iliac crest with skin glue. Continue to monitor.  7. Fluids/Electrolytes/Nutrition: Monitor I/O.  8. HTN: Monitor BP tid--continue Maxzide. Labile- continue to monitor.  9. Neuropathy: Gabapentin increased to 400 mg tid on 06/14. Continues on 4mg  IV decadron every 8 hrs.              6/14: Gabapentin was increased to 400mg  TID as worst pain is currently the neuropathic pain in RLE. If well tolerated and pain persists, can consider increasing dose further.  10. Hypothyroid: Continue supplement.  11. Thrush: Continue Nystatin swish and swallow. Improving.  12. Leukocytosis: 2/2 steroids. 17.3 on 6/15. Continue to monitor weekly.  13. Blood loss anemia: 10.4 on 6/15. Continue to monitor weekly. 14. Hypocalcemia: Ca 8.5 on 6/15. Started on calcium supplement. Monitor weekly.   15.  Constipation: No benefit with soap suds enema, will try fleet enema.    LOS: 3 days A FACE TO FACE EVALUATION WAS PERFORMED  Lourdes Manning P Cyndia Degraff 08/13/2019, 3:00 PM

## 2019-08-13 NOTE — Progress Notes (Signed)
Occupational Therapy Session Note  Patient Details  Name: Stacy Wagner MRN: [id number] Date of Birth: October 28, 1940  Today's Date: 08/13/2019 OT Individual Time: 1300-1343 OT Individual Time Calculation (min): 43 min    Short Term Goals: Week 1:  OT Short Term Goal 1 (Week 1): Pt will be able to pull pants down with min A in standing to prep for toileting. OT Short Term Goal 2 (Week 1): Using a toilet aid, pt will be able to self cleanse with min A. OT Short Term Goal 3 (Week 1): pt will don socks with sock aid with supervision. OT Short Term Goal 4 (Week 1): Pt will don pants over feet with reacher with min A.  Skilled Therapeutic Interventions/Progress Updates:    Treatment session with focus on ADL retraining, functional transfers, and AE education. Pt received seated in w/c complaining of sciatic pain and gasping intermittently due to reported sharp pain. Engaged in discussion about pt's constipation, pt discussed wanting to use device used at home to flush bowels. Educated pt on energy conservation comparing energy to gas tank, pt reported "my gas tank is never full in the mornings." Completed stand pivot transfer w/c <> BSC over toilet with RW and minA fading to CGA for standing balance. Pt demonstrated strong sequencing and safety awareness. Completed clothing management with modA without AE. Pt able to recall precuations with use of visual aid. Pt doffed socks with minA and extended time due to decreased reach and decreased foot lift in LLE. Provided and demonstrated use of sock aid. Pt able to complete second sock with minA, demonstrating proficiency. Provided and educated on use of shoe horn, would benefit from demonstration in future sessions. Ended session with pt seated in w/c, seat alarm on and MD present.   Therapy Documentation Precautions:  Precautions Precautions: Back, Fall Required Braces or Orthoses: Spinal Brace Spinal Brace: Thoracolumbosacral orthotic, Applied in  sitting position Restrictions Weight Bearing Restrictions: No Pain: Pain in LLE and sciatic pain, repositioned    Therapy/Group: Individual Therapy  Michelle Nasuti 08/13/2019, 3:17 PM

## 2019-08-14 ENCOUNTER — Inpatient Hospital Stay (HOSPITAL_COMMUNITY): Payer: Medicare Other | Admitting: Occupational Therapy

## 2019-08-14 ENCOUNTER — Inpatient Hospital Stay (HOSPITAL_COMMUNITY): Payer: Medicare Other

## 2019-08-14 MED ORDER — CLONAZEPAM 0.25 MG PO TBDP
0.2500 mg | ORAL_TABLET | Freq: Two times a day (BID) | ORAL | Status: DC | PRN
  Administered 2019-08-19: 0.25 mg via ORAL
  Filled 2019-08-14: qty 1

## 2019-08-14 NOTE — Progress Notes (Signed)
Occupational Therapy Session Note  Patient Details  Name: Stacy Wagner MRN: [id number] Date of Birth: 08/18/1940  Today's Date: 08/14/2019 OT Individual Time: 0903-1000 and 0626-9485 OT Individual Time Calculation (min): 57 min and 26 min   Short Term Goals: Week 1:  OT Short Term Goal 1 (Week 1): Pt will be able to pull pants down with min A in standing to prep for toileting. OT Short Term Goal 2 (Week 1): Using a toilet aid, pt will be able to self cleanse with min A. OT Short Term Goal 3 (Week 1): pt will don socks with sock aid with supervision. OT Short Term Goal 4 (Week 1): Pt will don pants over feet with reacher with min A.  Skilled Therapeutic Interventions/Progress Updates:    Pt greeted at time of session reclined in bed sleepy but agreeable to OT session to get OOB, toilet, dress, and perform mobility in room. Supine to sitting up at EOB with Min-Mod A with managing RLE d/t pain, maintaining back precautions. Once sitting up at EOB, pt did not need to wash UB or change shirt, donned orthosis by therapist with pt helping to hold straps in place and walked step by step through donning process. Sit to stand Min A and ambulated to the bathroom CGA with RW, performed transfer in same manner to toilet with 3-in-1. Pt able to assist more with donning/doffing clothing today, Mod A overall. Nursing performed enema while seated on toilet. Therapist assist for hygiene for thoroughness. LB dressing after toileting with use of reacher for AE for back precautions, Min-Mod A to don pants with partial assist to thread and don over hips, but pt able to stand with unilateral or briefly with no UE support to assist. Ambulated back to her room and transferred to bed with CGA, doffed brace and returned to supine d/t pt reports of fatigue with Mod A while maintaining precautions. Call bell in reach, alarm on, all needs met.   Session 2: Pt greeted at time of session sitting up in chair with family  present, stating she needed to use the bathroom. Ambulated with RW throughout room and bathroom with Min-CGA and performed transfer to toilet in same manner, Min-Mod with clothing management today which is an improvement. Pt did have some loose BM, likely from earlier enema, and performed static standing while therapist assisted in cleaning periarea. Attempted to have the pt reach back while sitting on commode without breaking precautions but pt felt like she couldn't reach back far enough. After toileting, ambulated to sink and performed hand hygiene in standing without LOB, ambulated short distance in hall with CGA with RW for endurance. Pt brought back to room via wheelchair, call bell in reach, alarm on, call needs met.    Therapy Documentation Precautions:  Precautions Precautions: Back, Fall Precaution Comments: reviewed 3/3 back precautions Required Braces or Orthoses: Spinal Brace Spinal Brace: Thoracolumbosacral orthotic, Applied in sitting position Restrictions Weight Bearing Restrictions: No    Therapy/Group: Individual Therapy  Viona Gilmore 08/14/2019, 12:00 PM

## 2019-08-14 NOTE — Progress Notes (Signed)
Pt suppository given and digital stimulation performed. Small amount of stool noted

## 2019-08-14 NOTE — Progress Notes (Signed)
Occupational Therapy Session Note  Patient Details  Name: Stacy Wagner MRN: [id number] Date of Birth: [birth year]  Today's Date: 08/14/2019 OT Individual Time: 6546-5035 OT Individual Time Calculation (min): 24 min    Short Term Goals: Week 1:  OT Short Term Goal 1 (Week 1): Pt will be able to pull pants down with min A in standing to prep for toileting. OT Short Term Goal 2 (Week 1): Using a toilet aid, pt will be able to self cleanse with min A. OT Short Term Goal 3 (Week 1): pt will don socks with sock aid with supervision. OT Short Term Goal 4 (Week 1): Pt will don pants over feet with reacher with min A.  Skilled Therapeutic Interventions/Progress Updates:    Treatment session with focus on ADL retraining, family and AE education, and functional transfers. Pt received semi-reclined in bed reporting reduced sciatic pain, with partner and daughter present. Discussed family education and educated pt about importance of safety upon d/c, pt reporting "I don't want to leave before it's safe." Completed supine to sit with modA with verbal cues for hand placement, sequencing, and to remember back precautions. Pt donned TLSO with maxA at EOB. Educated family and pt on donning TLSO. Demonstrated use of sock aid and reacher to family. Pt doffed socks with reacher and minA and donned socks with sock aid supervision. Pt completed ambulatory transfer with RW with CGA to [facility] over toilet. Ended session with pt seated on BSC over toilet with nurses notified to complete enema.   Therapy Documentation Precautions:  Precautions Precautions: Back, Fall Precaution Comments: reviewed 3/3 back precautions Required Braces or Orthoses: Spinal Brace Spinal Brace: Thoracolumbosacral orthotic, Applied in sitting position Restrictions Weight Bearing Restrictions: No  Pain: Intermittent sciatic pain reported during session with grimacing, repositioned   Therapy/Group: Individual Therapy  Michelle Nasuti 08/14/2019, 4:02 PM

## 2019-08-14 NOTE — Progress Notes (Signed)
Physical Therapy Session Note  Patient Details  Name: Stacy Wagner MRN: [id number] Date of Birth: 1940/09/14  Today's Date: 08/14/2019 PT Individual Time: 2774-1287 PT Individual Time Calculation (min): 54 min   Short Term Goals: Week 1:  PT Short Term Goal 1 (Week 1): Pt will perform supine<>sit with minA. PT Short Term Goal 2 (Week 1): Pt will perform bed to chair with minA. PT Short Term Goal 3 (Week 1): Pt will ambulates 22' with CGA. PT Short Term Goal 4 (Week 1): Pt will perform 4 steps with CGA.  Skilled Therapeutic Interventions/Progress Updates:     Pt received seated in recliner and agreeable to therapy. Reports cramping pain behind R knee. PT provides rest breaks as needed during session for pain management. Sit to stand throughout session with supervision and verbal cues for body mechanics and positioning. Pt ambulates 300' to dayroom with RW and minA from PT for RW propulsion to allow for functional gait speed and increased bilateral stride length. Pt performs Nustep for 20 minutes at workload of 3 and completes 525 steps. Activity performed for strength and endurance training with PT cues for foot placement and BUE movement for optimal performance. Pt transfers to mat table and practices sit<>supine with PT providing modA at trunk and RLE to facilitate correct logrolling technique. Pt transitions to practicing R elbow propped position to seated with PT providing cues on body mechanics. Pt ambulates 300' back to room with RW and minA. Left seated in WC with alarm intact and all needs within reach.  Therapy Documentation Precautions:  Precautions Precautions: Back, Fall Precaution Comments: reviewed 3/3 back precautions Required Braces or Orthoses: Spinal Brace Spinal Brace: Thoracolumbosacral orthotic, Applied in sitting position Restrictions Weight Bearing Restrictions: No   Therapy/Group: Individual Therapy  Breck Coons, PT, DPT 08/14/2019, 5:46 PM

## 2019-08-14 NOTE — Progress Notes (Signed)
RN explained digital stimulation and patient agreable.Patient positioned to L side. RN performed digital stimulation x 2 and got 2 small stools that are soft and brown. Pericare done.

## 2019-08-14 NOTE — Progress Notes (Signed)
[place] PHYSICAL MEDICINE & REHABILITATION PROGRESS NOTE   Subjective/Complaints: Slept 75% better last night.  Had a small BM with soap suds enema yesterday and would like another one today- also uses this at home at times.  She was very tearful and anxious yesterday- appears to be improved today, still groggy.    ROS: +dysesthesias, constipation, thrush, muscle spasms, anxiety.  -fever, chills  Objective:   No results found. No results for input(s): WBC, HGB, HCT, PLT in the last 72 hours. No results for input(s): NA, K, CL, CO2, GLUCOSE, BUN, CREATININE, CALCIUM in the last 72 hours.  Intake/Output Summary (Last 24 hours) at 08/14/2019 0932 Last data filed at 08/13/2019 2212 Gross per 24 hour  Intake 400 ml  Output 44 ml  Net 356 ml     Physical Exam: Vital Signs Blood pressure 122/79, pulse 73, temperature 97.9 F (36.6 C), resp. rate 18, height 5\' 8"  (1.727 m), weight 72 kg, SpO2 98 %. General: Alert and oriented x 3, No apparent distress, groggy this morning.  HEENT: Head is normocephalic, atraumatic, PERRLA, EOMI, sclera anicteric, oral mucosa pink and moist, dentition intact, ext ear canals clear, +thrush Neck: Supple without JVD or lymphadenopathy Heart: Reg rate and rhythm. No murmurs rubs or gallops Chest: CTA bilaterally without wheezes, rales, or rhonchi; no distress Abdomen: Soft, non-tender, non-distended, bowel sounds positive. Extremities: No clubbing, cyanosis, or edema. Pulses are 2+ Skin: Honeycomb dressing along spinal incision without drainage. Incision sites in right iliac crest with skin glue, C/D/I Neuro: Pt is cognitively appropriate with normal insight, memory, and awareness. Cranial nerves 2-12 are intact. Sensory exam is normal. Fine motor coordination is intact. No tremors. Motor function is grossly 5/5 in bilateral upper extremities. LLE grossly 4+/5. RLE with HF and KE at 2/5 and DF/DF 4/5- pain limited. RLE tender to even light touch.   Functional mobility: Ambulating RW MinA Psych: Pt's affect is appropriate. Pt is cooperative  Assessment/Plan: 1. Functional deficits secondary to impaired mobility and ADLs secondary to L5-S1 anterior lumbar interbody fusion and L1-L5 anterolateral lumbar interbody fusion for stage 1 scoliosis, complicated by dysesthesias in RLE, which require 3+ hours per day of interdisciplinary therapy in a comprehensive inpatient rehab setting.  Physiatrist is providing close team supervision and 24 hour management of active medical problems listed below.  Physiatrist and rehab team continue to assess barriers to discharge/monitor patient progress toward functional and medical goals  Care Tool:  Bathing    Body parts bathed by patient: Right arm, Left arm, Chest, Abdomen, Face   Body parts bathed by helper: Right upper leg, Left upper leg, Buttocks     Bathing assist Assist Level: Moderate Assistance - Patient 50 - 74%     Upper Body Dressing/Undressing Upper body dressing   What is the patient wearing?: Button up shirt, Orthosis Orthosis activity level: Performed by helper (with pt assist to hold in place)  Upper body assist Assist Level: Minimal Assistance - Patient > 75% (Min A to help bring over shoulder for button up shirt to prevent twisting)    Lower Body Dressing/Undressing Lower body dressing      What is the patient wearing?: Incontinence brief     Lower body assist Assist for lower body dressing: Maximal Assistance - Patient 25 - 49%     Toileting Toileting    Toileting assist Assist for toileting: Moderate Assistance - Patient 50 - 74%     Transfers Chair/bed transfer  Transfers assist  Chair/bed transfer assist level: Minimal Assistance - Patient > 75%     Locomotion Ambulation   Ambulation assist      Assist level: Minimal Assistance - Patient > 75% Assistive device: Walker-rolling Max distance: 300'   Walk 10 feet activity   Assist      Assist level: Supervision/Verbal cueing Assistive device: Walker-rolling   Walk 50 feet activity   Assist Walk 50 feet with 2 turns activity did not occur: Safety/medical concerns  Assist level: Minimal Assistance - Patient > 75% Assistive device: Walker-rolling    Walk 150 feet activity   Assist Walk 150 feet activity did not occur: Safety/medical concerns  Assist level: Minimal Assistance - Patient > 75% Assistive device: Walker-rolling    Walk 10 feet on uneven surface  activity   Assist     Assist level: Minimal Assistance - Patient > 75% Assistive device: Aeronautical engineer Will patient use wheelchair at discharge?: No             Wheelchair 50 feet with 2 turns activity    Assist            Wheelchair 150 feet activity     Assist          Blood pressure 122/79, pulse 73, temperature 97.9 F (36.6 C), resp. rate 18, height 5\' 8"  (1.727 m), weight 72 kg, SpO2 98 %.  Medical Problem List and Plan: 1.  Impaired mobility and ADLs secondary to L5-S1 anterior lumbar interbody fusion and L1-L5 anterolateral lumbar interbody fusion for stage 1 scoliosis, complicated by dysesthesias in RLE.             -patient may shower but incision must be covered             -ELOS/Goals: modI 12-16 days  -Continue CIR therapies. 2.  Antithrombotics: -DVT/anticoagulation:  Pharmaceutical: Lovenox- d/ced since patient ambulated 300 feet 6/17 3. Pain Management: Continue Oxycodone prn--d/c hydrodocone/hydromorphone. On valium and robaxin prn for muscle spasms.   6/16: Complaining of painful muscle spasms this morning. Valium was discontinued, can consider restarting, but prefer use of less sedating Methocarbamol at this time. Encourage patient to ask if she needs.   6/17: patient tearful that pain is very sever at night and prevented her from sleeping. Started amitriptyline 10mg  at night to help with insomnia and neuropathic pain.    6/18: Not in pain this morning. Hopefully amitriptyline will help with pain as well.  4. Mood: LCSW to follow for evaluation and support. Anxious at times. Klonopin 0.25mg  prn ordered.              -antipsychotic agents: N/A 5. Neuropsych: This patient is capable of making decisions on her own behalf. 6. Skin/Wound Care: Honeycomb dressing along spinal incision without drainage. Incision sites in right iliac crest with skin glue. Continue to monitor.  7. Fluids/Electrolytes/Nutrition: Monitor I/O.  8. HTN: Monitor BP tid--continue Maxzide. Labile- continue to monitor.  9. Neuropathy: Gabapentin increased to 400 mg tid on 06/14. Continues on 4mg  IV decadron every 8 hrs.              6/14: Gabapentin was increased to 400mg  TID as worst pain is currently the neuropathic pain in RLE. If well tolerated and pain persists, can consider increasing dose further.  10. Hypothyroid: Continue supplement.  11. Thrush: Continue Nystatin swish and swallow. Improving.  12. Leukocytosis: 2/2 steroids. 17.3 on 6/15. Continue to monitor weekly.  13. Blood loss  anemia: 10.4 on 6/15. Continue to monitor weekly. 14. Hypocalcemia: Ca 8.5 on 6/15. Started on calcium supplement. Monitor weekly.   15. Constipation: Some benefit with soap suds enema, will repeat today.  16. Insomnia: Improved with amitriptyline.    LOS: 4 days A FACE TO FACE EVALUATION WAS PERFORMED  Clide Deutscher Karena Kinker 08/14/2019, 9:32 AM

## 2019-08-14 NOTE — Progress Notes (Signed)
Pt given soap suds enema. Encouraged to try to retain as long as possible. Small amount of brownish material out.

## 2019-08-15 ENCOUNTER — Inpatient Hospital Stay (HOSPITAL_COMMUNITY): Payer: Medicare Other

## 2019-08-15 ENCOUNTER — Inpatient Hospital Stay (HOSPITAL_COMMUNITY): Payer: Medicare Other | Admitting: Physical Therapy

## 2019-08-15 NOTE — Progress Notes (Signed)
Occupational Therapy Session Note  Patient Details  Name: Stacy Wagner MRN: [id number] Date of Birth: August 29, 1940  Today's Date: 08/15/2019 OT Individual Time: 0130-0230 OT Individual Time Calculation (min): 60 min    Short Term Goals: Week 1:  OT Short Term Goal 1 (Week 1): Pt will be able to pull pants down with min A in standing to prep for toileting. OT Short Term Goal 2 (Week 1): Using a toilet aid, pt will be able to self cleanse with min A. OT Short Term Goal 3 (Week 1): pt will don socks with sock aid with supervision. OT Short Term Goal 4 (Week 1): Pt will don pants over feet with reacher with min A.  Skilled Therapeutic Interventions/Progress Updates:  Pt received alseep in w/c but easily able to arouse and agreeable to OT intervention. Pt transported to therapy gym with total A for time mgmt. Issued pt reacher bag and walker bag as ECS. Pt completed functional mobility in gym with RW where pt instructed to retrieve wash cloths around gym with reacher. Pt completed task with CGA. Pt able to stand to fold wash cloths ~ 2 mins to facilitate increased activity tolerance for higher level BADLs. Pt completed functional mobility with RW across uneven surfaces and up/ down an incline with CGA. Pt able to complete simulated IADL task where pt able to reach into cabinet, retrieve cup, and fill cup with water with CGA and min cues for RW mgmt. Pt completed 2 sets of 10 reps of standing BLE strengthening therex where pt instructed to complete half squats off EOM to facilitate increased BLE strength for higher level functional mobility tasks. Pt complete functional mobility back to room with RW and CGA where pts daughter and husband were present. Pt request to void bladder with pt needing MOD A for clothing mgmt. Pt returned to supine with MOD A needing most assist to elevate BLEs back to bed. Attempted hooking LLE behind RLE to assist with returning BLEs to EOB with good success as pt continues to  present with decreased strength in RLE. Pt left supine in bed with family present, all needs within reach and bed alarm activated.   Therapy Documentation Precautions:  Precautions Precautions: Back, Fall Precaution Comments: reviewed 3/3 back precautions Required Braces or Orthoses: Spinal Brace Spinal Brace: Thoracolumbosacral orthotic, Applied in sitting position Restrictions Weight Bearing Restrictions: No General:   Vital Signs: Therapy Vitals Temp: (!) 97.5 F (36.4 C) Pulse Rate: 83 Resp: 16 BP: (!) 114/56 Patient Position (if appropriate): Sitting Oxygen Therapy SpO2: 98 % O2 Device: Room Air Pain: Pt continues to report pain in RLE that comes on sporadically needing increased rest breaks.   Therapy/Group: Individual Therapy  Ihor Gully 08/15/2019, 3:53 PM

## 2019-08-15 NOTE — Progress Notes (Signed)
Patient with good bowel sounds. Digital stimulation done but no stool came out.

## 2019-08-15 NOTE — Progress Notes (Signed)
Suppository given and digital stim performed. No stool noted.

## 2019-08-15 NOTE — Progress Notes (Signed)
[place] PHYSICAL MEDICINE & REHABILITATION PROGRESS NOTE   Subjective/Complaints: Sleeping better at night with Amitriptyline. Wakes after 2-2.5 hours in pain and received Tramadol, which helps. Feels pain is still severe at time but manageable. Feels drowsy with amitriptyline but would like to continue it to help her sleep.    ROS: +dysesthesias, constipation, thrush, muscle spasms, anxiety.  -fever, chills  Objective:   No results found. No results for input(s): WBC, HGB, HCT, PLT in the last 72 hours. No results for input(s): NA, K, CL, CO2, GLUCOSE, BUN, CREATININE, CALCIUM in the last 72 hours.  Intake/Output Summary (Last 24 hours) at 08/15/2019 1508 Last data filed at 08/15/2019 1300 Gross per 24 hour  Intake 660 ml  Output --  Net 660 ml     Physical Exam: Vital Signs Blood pressure (!) 114/56, pulse 83, temperature (!) 97.5 F (36.4 C), resp. rate 16, height 5\' 8"  (1.727 m), weight 72 kg, SpO2 98 %. General: Alert and oriented x 3, No apparent distress, drowsy.  HEENT: Head is normocephalic, atraumatic, PERRLA, EOMI, sclera anicteric, oral mucosa pink and moist, dentition intact, ext ear canals clear, +thrush Neck: Supple without JVD or lymphadenopathy Heart: Reg rate and rhythm. No murmurs rubs or gallops Chest: CTA bilaterally without wheezes, rales, or rhonchi; no distress Abdomen: Soft, non-tender, non-distended, bowel sounds positive. Extremities: No clubbing, cyanosis, or edema. Pulses are 2+ Skin: Honeycomb dressing along spinal incision without drainage. Incision sites in right iliac crest with skin glue, C/D/I Neuro: Pt is cognitively appropriate with normal insight, memory, and awareness. Cranial nerves 2-12 are intact. Sensory exam is normal. Fine motor coordination is intact. No tremors. Motor function is grossly 5/5 in bilateral upper extremities. LLE grossly 4+/5. RLE with HF and KE at 2/5 and DF/DF 4/5- pain limited. RLE tender to even light touch.   MSK: Wearing TLSO Functional mobility: Ambulating RW MinA Psych: Pt's affect is appropriate. Pt is cooperative  Assessment/Plan: 1. Functional deficits secondary to impaired mobility and ADLs secondary to L5-S1 anterior lumbar interbody fusion and L1-L5 anterolateral lumbar interbody fusion for stage 1 scoliosis, complicated by dysesthesias in RLE, which require 3+ hours per day of interdisciplinary therapy in a comprehensive inpatient rehab setting.  Physiatrist is providing close team supervision and 24 hour management of active medical problems listed below.  Physiatrist and rehab team continue to assess barriers to discharge/monitor patient progress toward functional and medical goals  Care Tool:  Bathing    Body parts bathed by patient: Face, Right upper leg, Left upper leg, Right arm, Left arm   Body parts bathed by helper: Right upper leg, Left upper leg, Buttocks     Bathing assist Assist Level: Set up assist     Upper Body Dressing/Undressing Upper body dressing   What is the patient wearing?: Orthosis Orthosis activity level: Performed by patient  Upper body assist Assist Level: Moderate Assistance - Patient 50 - 74%    Lower Body Dressing/Undressing Lower body dressing      What is the patient wearing?: Incontinence brief, Pants     Lower body assist Assist for lower body dressing: Moderate Assistance - Patient 50 - 74%     Toileting Toileting    Toileting assist Assist for toileting: Moderate Assistance - Patient 50 - 74%     Transfers Chair/bed transfer  Transfers assist     Chair/bed transfer assist level: Contact Guard/Touching assist Chair/bed transfer assistive device: Programmer, multimedia   Ambulation assist  Assist level: Minimal Assistance - Patient > 75% Assistive device: Walker-rolling Max distance: 166ft   Walk 10 feet activity   Assist     Assist level: Contact Guard/Touching assist Assistive device:  Walker-rolling   Walk 50 feet activity   Assist Walk 50 feet with 2 turns activity did not occur: Safety/medical concerns  Assist level: Minimal Assistance - Patient > 75% Assistive device: Walker-rolling    Walk 150 feet activity   Assist Walk 150 feet activity did not occur: Safety/medical concerns  Assist level: Minimal Assistance - Patient > 75% Assistive device: Walker-rolling    Walk 10 feet on uneven surface  activity   Assist     Assist level: Minimal Assistance - Patient > 75% Assistive device: Aeronautical engineer Will patient use wheelchair at discharge?: No             Wheelchair 50 feet with 2 turns activity    Assist            Wheelchair 150 feet activity     Assist          Blood pressure (!) 114/56, pulse 83, temperature (!) 97.5 F (36.4 C), resp. rate 16, height 5\' 8"  (1.727 m), weight 72 kg, SpO2 98 %.  Medical Problem List and Plan: 1.  Impaired mobility and ADLs secondary to L5-S1 anterior lumbar interbody fusion and L1-L5 anterolateral lumbar interbody fusion for stage 1 scoliosis, complicated by dysesthesias in RLE.             -patient may shower but incision must be covered             -ELOS/Goals: modI 12-16 days  -Continue CIR therapies. 2.  Antithrombotics: -DVT/anticoagulation:  Pharmaceutical: Lovenox- d/ced since patient ambulated 300 feet 6/17 3. Pain Management: Continue Oxycodone prn--d/c hydrodocone/hydromorphone. On valium and robaxin prn for muscle spasms.   6/16: Complaining of painful muscle spasms this morning. Valium was discontinued, can consider restarting, but prefer use of less sedating Methocarbamol at this time. Encourage patient to ask if she needs.   6/17: patient tearful that pain is very sever at night and prevented her from sleeping. Started amitriptyline 10mg  at night to help with insomnia and neuropathic pain.   6/18: Not in pain this morning. Hopefully amitriptyline  will help with pain as well.   6/19: Says pain is still present (rated 5/10 in therapy today) but manageable.  4. Mood: LCSW to follow for evaluation and support. Anxious at times. Klonopin 0.25mg  prn ordered.              -antipsychotic agents: N/A 5. Neuropsych: This patient is capable of making decisions on her own behalf. 6. Skin/Wound Care: Honeycomb dressing along spinal incision without drainage. Incision sites in right iliac crest with skin glue. Continue to monitor.  7. Fluids/Electrolytes/Nutrition: Monitor I/O.  8. HTN: Monitor BP tid--continue Maxzide. Labile- continue to monitor.  9. Neuropathy: Gabapentin increased to 400 mg tid on 06/14. Continues on 4mg  IV decadron every 8 hrs.              6/14: Gabapentin was increased to 400mg  TID as worst pain is currently the neuropathic pain in RLE. If well tolerated and pain persists, can consider increasing dose further.  10. Hypothyroid: Continue supplement.  11. Thrush: Continue Nystatin swish and swallow. Improving.  12. Leukocytosis: 2/2 steroids. 17.3 on 6/15. Continue to monitor weekly.  13. Blood loss anemia: 10.4 on 6/15. Continue to monitor  weekly. 14. Hypocalcemia: Ca 8.5 on 6/15. Started on calcium supplement. Monitor weekly.   15. Constipation: Had BM yesterday with soap suds enema.   16. Insomnia: Improved with amitriptyline. It does make her very groggy during the day but she would like to continue at this time.    LOS: 5 days A FACE TO FACE EVALUATION WAS PERFORMED  Martha Clan P Toneshia Coello 08/15/2019, 3:08 PM

## 2019-08-15 NOTE — Plan of Care (Signed)
  Problem: Consults Goal: RH GENERAL PATIENT EDUCATION Description: Patient will be aware of their progress and know their expected goals and expected discharge date.    Outcome: Progressing Goal: Skin Care Protocol Initiated - if Braden Score 18 or less Description: If consults are not indicated, leave blank or document N/A Outcome: Progressing   Problem: RH BOWEL ELIMINATION Goal: RH STG MANAGE BOWEL WITH ASSISTANCE Description: STG Manage Bowel with mod Assistance. Outcome: Progressing Goal: RH STG MANAGE BOWEL W/MEDICATION W/ASSISTANCE Description: STG Manage Bowel with Medication with mod Assistance. Outcome: Progressing   Problem: RH BLADDER ELIMINATION Goal: RH STG MANAGE BLADDER WITH ASSISTANCE Description: Patient will be continent of bladder prior to discharge and able to empty bladder when urinating.  Outcome: Progressing   Problem: RH SKIN INTEGRITY Goal: RH STG SKIN FREE OF INFECTION/BREAKDOWN Description: Patient will remain free of skin tears and no new signs or symptoms of infection will appear.  Outcome: Progressing   Problem: RH SAFETY Goal: RH STG ADHERE TO SAFETY PRECAUTIONS W/ASSISTANCE/DEVICE Description: Patient will refrain from falls and ask for assistance as needed.  Outcome: Progressing   Problem: RH PAIN MANAGEMENT Goal: RH STG PAIN MANAGED AT OR BELOW PT'S PAIN GOAL Description: Less than 3 out of 10 Outcome: Progressing Note: Shooting pain from back to R leg relieved by pain med and muscle relaxant

## 2019-08-15 NOTE — Progress Notes (Addendum)
Occupational Therapy Session Note  Patient Details  Name: Stacy Wagner MRN: [id number] Date of Birth: Jul 10, 1940  Today's Date: 08/15/2019 OT Individual Time: 9485-4627 OT Individual Time Calculation (min): 75 min    Short Term Goals: Week 1:  OT Short Term Goal 1 (Week 1): Pt will be able to pull pants down with min A in standing to prep for toileting. OT Short Term Goal 2 (Week 1): Using a toilet aid, pt will be able to self cleanse with min A. OT Short Term Goal 3 (Week 1): pt will don socks with sock aid with supervision. OT Short Term Goal 4 (Week 1): Pt will don pants over feet with reacher with min A.  Skilled Therapeutic Interventions/Progress Updates:  Pt recived supine in bed agreeable to OT intervention. Pt transfer supine >sit via log roll technique with MOD A with pt needing intermittent cues for sequencing technique. Pt requires MOD A to don brace EOB but able to direct care of how to don/doff with good carryover. Pt complete stand pivot transfer to w/c to pts L side with RW and CGA. Pt completed seated grooming tasks at sink with supervision- set- up assist. Pt with good carryover of back precautions into ADLs. Pt complete functional mobility into BR with RW and CGA. Pt required MOD for toileting tasks mostly needing assist for clothing mgmt. Pt return to w/c via functional mobility with RW and CGA. Pt completed LB dressing from w/c with reacher and MODA. Instructed pt on donning RLE into pants first. Pt required light MIN A to pull pants up to waist line in standing; CGA for sit<>stand from w/c with RW. Pt transported to ADL apartment from w/c for time mgmt. Pt completed bed mobility on to flat surface to practice log roll technique with MOD A. Pt with most difficulty elevating BLEs onto EOB. Pt reports she will be entering/ exiting bed from R side at home and feels bed mobility would be much easier if her RLE didn't hurt so much. Pt request to ambulate back to room from ADL  apartment. Pt complete tasks with CGA and close chair follow. Pt left up in w/c with alarm pad activated and all needs within reach.   Therapy Documentation Precautions:  Precautions Precautions: Back, Fall Precaution Comments: reviewed 3/3 back precautions Required Braces or Orthoses: Spinal Brace Spinal Brace: Thoracolumbosacral orthotic, Applied in sitting position Restrictions Weight Bearing Restrictions: No General:   Vital Signs:   Pain: Pt reports pain in RLE and low back during session needing increased rest breaks; RN provided pain meds prior to session and post session.   Therapy/Group: Individual Therapy  Ihor Gully 08/15/2019, 10:06 AM

## 2019-08-15 NOTE — Progress Notes (Signed)
Physical Therapy Session Note  Patient Details  Name: Stacy Wagner MRN: [id number] Date of Birth: [birth year]  Today's Date: 08/15/2019 PT Individual Time: 3790-2409 PT Individual Time Calculation (min): 61 min   Short Term Goals: Week 1:  PT Short Term Goal 1 (Week 1): Pt will perform supine<>sit with minA. PT Short Term Goal 2 (Week 1): Pt will perform bed to chair with minA. PT Short Term Goal 3 (Week 1): Pt will ambulates 37' with CGA. PT Short Term Goal 4 (Week 1): Pt will perform 4 steps with CGA.  Skilled Therapeutic Interventions/Progress Updates:    Pt received sitting in wheelchair and is very drowsy with heavy eyelids but agreeable to therapy session. Pt continues to report she is having R LE "sciatica" pain that is a sharp and comes on suddenly rated as 5/10 - therapist provided rest breaks, distraction, and emotional support for pain management during session. Pt also reports some gluteal muscle soreness from exercise yesterday. Pt wearing TLSO throughout session. Sit<>stands using RW to/from chairs with armrests with CGA for steadying - cuing to push up from armrests and not pull up on RW with both hands. Gait training ~160ft using RW to main therapy gym with CGA - pt initially with step-to pattern with decreased R LE stance time due to pain but with min assist for AD management able to progress to a more reciprocal pattern - continues to demonstrate very slow gait speed requiring significant amount of time to get to therapy gym. Gait training ~9ft to stairs in gym using RW with CGA for steadying - continues to demonstrate very slow gait speed and is more pronounced with therapist not facilitating forward movement of RW. Ascended/descended 8 steps (6" height) using B HRs with min assist progressed to CGA - ascended forward with step-to pattern leading with L LE and descended backwards with step-to pattern leading with R LE. After this pt started to have shooting, electrical shock  pain down R LE that was much more frequent requiring seated rest break for pain management. Standing with RW in front of pt but HHA performed balance task of alternating arm movements overhead with pt demonstrating mildly increased postural sway requiring min assist for balance. Performed dynamic balance task of alternate B LE foot taps on 8" step using B UE support on RW - pt unable to fully raise R LE to place on step due to pain and weakness. Gait training ~190ft back to room using RW, therapist continuing to provide min assist for forward movement of AD to improve gait speed and reciprocal, more symmetric stepping. Pt requests to use bathroom - ambulated in/out using RW with CGA. Standing with CGA performed LB clothing management with mod assist. Continent of bladder and performed seated peri-care without assist. Pt left seated in w/c with needs in reach, TLSO on, and chair alarm on.   Therapy Documentation Precautions:  Precautions Precautions: Back, Fall Precaution Comments: reviewed 3/3 back precautions Required Braces or Orthoses: Spinal Brace Spinal Brace: Thoracolumbosacral orthotic, Applied in sitting position Restrictions Weight Bearing Restrictions: No  Pain: Details above - continues to have R LE "sciatica" nerve pain and has some gluteal muscle soreness from exercise.   Therapy/Group: Individual Therapy  Tawana Scale, PT, DPT 08/15/2019, 7:56 AM

## 2019-08-16 NOTE — Progress Notes (Signed)
Patient had a stool smear on brief. Digital stimulation done but no stool evacuated.

## 2019-08-16 NOTE — Progress Notes (Signed)
Second dig stim performed. Small amount of soft stool to glove. Pt continues to deny urge to have bm.

## 2019-08-16 NOTE — Progress Notes (Signed)
Verbal order to discontinue Honeycomb dressing to procedural site on pt back.

## 2019-08-16 NOTE — Progress Notes (Signed)
Patient stated she needs to have a bowel movement. Digital stimulation done that resulted in a large bowel movement. Peri care done.

## 2019-08-16 NOTE — Progress Notes (Signed)
Digital stim performed and suppository placed. Small amount of soft stool noted to glove. Pt denies urge to have BM.

## 2019-08-16 NOTE — Progress Notes (Signed)
Honeycomb surgical dressing removed from pt back per order. Area clean, dry and intact, some scabbing noted. OTA per order.

## 2019-08-16 NOTE — Progress Notes (Signed)
Pt given prn soap suds enema, pt encouraged to retain as long as able to. Able to retain for several minutes. Only brownish liquid out.

## 2019-08-16 NOTE — Progress Notes (Signed)
[place] PHYSICAL MEDICINE & REHABILITATION PROGRESS NOTE   Subjective/Complaints: Slept poorly last night- woke nearly every hour. Would prefer to continue the Amitriptyline since it has been working well for her previous nights. RLE dysesthesias still bothersome to her. Receiving Tramadol and Gabapentin TID and prn Methocarbamol and Oxycodone- do not want to oversedate.   ROS: +dysesthesias, constipation, thrush, muscle spasms, anxiety.  -fever, chills  Objective:   No results found. No results for input(s): WBC, HGB, HCT, PLT in the last 72 hours. No results for input(s): NA, K, CL, CO2, GLUCOSE, BUN, CREATININE, CALCIUM in the last 72 hours.  Intake/Output Summary (Last 24 hours) at 08/16/2019 1110 Last data filed at 08/16/2019 0700 Gross per 24 hour  Intake 900 ml  Output --  Net 900 ml     Physical Exam: Vital Signs Blood pressure (!) 132/59, pulse 70, temperature 97.7 F (36.5 C), temperature source Oral, resp. rate 18, height 5\' 8"  (1.727 m), weight 72 kg, SpO2 97 %. General: Alert and oriented x 3, No apparent distress HEENT: Head is normocephalic, atraumatic, PERRLA, EOMI, sclera anicteric, oral mucosa pink and moist, dentition intact, ext ear canals clear,  Neck: Supple without JVD or lymphadenopathy Heart: Reg rate and rhythm. No murmurs rubs or gallops Chest: CTA bilaterally without wheezes, rales, or rhonchi; no distress Abdomen: Soft, non-tender, non-distended, bowel sounds positive. Extremities: No clubbing, cyanosis, or edema. Pulses are 2+ Skin: Honeycomb dressing along spinal incision without drainage. Incision sites in right iliac crest with skin glue, C/D/I Neuro: Pt is cognitively appropriate with normal insight, memory, and awareness. Cranial nerves 2-12 are intact. Sensory exam is normal. Fine motor coordination is intact. No tremors. Motor function is grossly 5/5 in bilateral upper extremities. LLE grossly 4+/5. RLE with HF and KE at 2/5 and DF/DF 4/5-  pain limited. RLE tender to even light touch.  MSK: Wearing TLSO Functional mobility: Ambulating RW MinA Psych: Pt's affect is appropriate. Pt is cooperative   Assessment/Plan: 1. Functional deficits secondary to impaired mobility and ADLs secondary to L5-S1 anterior lumbar interbody fusion and L1-L5 anterolateral lumbar interbody fusion for stage 1 scoliosis, complicated by dysesthesias in RLE, which require 3+ hours per day of interdisciplinary therapy in a comprehensive inpatient rehab setting.  Physiatrist is providing close team supervision and 24 hour management of active medical problems listed below.  Physiatrist and rehab team continue to assess barriers to discharge/monitor patient progress toward functional and medical goals  Care Tool:  Bathing    Body parts bathed by patient: Face, Right upper leg, Left upper leg, Right arm, Left arm   Body parts bathed by helper: Right upper leg, Left upper leg, Buttocks     Bathing assist Assist Level: Set up assist     Upper Body Dressing/Undressing Upper body dressing   What is the patient wearing?: Orthosis Orthosis activity level: Performed by patient  Upper body assist Assist Level: Moderate Assistance - Patient 50 - 74%    Lower Body Dressing/Undressing Lower body dressing      What is the patient wearing?: Incontinence brief, Pants     Lower body assist Assist for lower body dressing: Moderate Assistance - Patient 50 - 74%     Toileting Toileting    Toileting assist Assist for toileting: Moderate Assistance - Patient 50 - 74%     Transfers Chair/bed transfer  Transfers assist     Chair/bed transfer assist level: Contact Guard/Touching assist Chair/bed transfer assistive device: Programmer, multimedia  Ambulation assist      Assist level: Minimal Assistance - Patient > 75% Assistive device: Walker-rolling Max distance: 170ft   Walk 10 feet activity   Assist     Assist level:  Contact Guard/Touching assist Assistive device: Walker-rolling   Walk 50 feet activity   Assist Walk 50 feet with 2 turns activity did not occur: Safety/medical concerns  Assist level: Minimal Assistance - Patient > 75% Assistive device: Walker-rolling    Walk 150 feet activity   Assist Walk 150 feet activity did not occur: Safety/medical concerns  Assist level: Minimal Assistance - Patient > 75% Assistive device: Walker-rolling    Walk 10 feet on uneven surface  activity   Assist     Assist level: Minimal Assistance - Patient > 75% Assistive device: Aeronautical engineer Will patient use wheelchair at discharge?: No             Wheelchair 50 feet with 2 turns activity    Assist            Wheelchair 150 feet activity     Assist          Blood pressure (!) 132/59, pulse 70, temperature 97.7 F (36.5 C), temperature source Oral, resp. rate 18, height 5\' 8"  (1.727 m), weight 72 kg, SpO2 97 %.  Medical Problem List and Plan: 1.  Impaired mobility and ADLs secondary to L5-S1 anterior lumbar interbody fusion and L1-L5 anterolateral lumbar interbody fusion for stage 1 scoliosis, complicated by dysesthesias in RLE.             -patient may shower but incision must be covered             -ELOS/Goals: modI 12-16 days  -Continue CIR therapies. 2.  Antithrombotics: -DVT/anticoagulation:  Pharmaceutical: Lovenox- d/ced since patient ambulated 300 feet 6/17 3. Pain Management: Continue Oxycodone prn--d/c hydrodocone/hydromorphone. On valium and robaxin prn for muscle spasms.   6/16: Complaining of painful muscle spasms this morning. Valium was discontinued, can consider restarting, but prefer use of less sedating Methocarbamol at this time. Encourage patient to ask if she needs.   6/17: patient tearful that pain is very sever at night and prevented her from sleeping. Started amitriptyline 10mg  at night to help with insomnia and  neuropathic pain.   6/18: Not in pain this morning. Hopefully amitriptyline will help with pain as well.   6/19: Says pain is still present (rated 5/10 in therapy today) but manageable.   6/20: Continues to have painful right lower extremity dysesthesias. On Tramadol and Gabapentin TID and has prn Oxycodone and Methocarbamol which she has been using.  4. Mood: LCSW to follow for evaluation and support. Anxious at times. Klonopin 0.25mg  prn ordered.              -antipsychotic agents: N/A 5. Neuropsych: This patient is capable of making decisions on her own behalf. 6. Skin/Wound Care: Honeycomb dressing along spinal incision without drainage. Incision sites in right iliac crest with skin glue. Continue to monitor. Can remove dressing 6/20 7. Fluids/Electrolytes/Nutrition: Monitor I/O.  8. HTN: Monitor BP tid--continue Maxzide. Labile- continue to monitor.  9. Neuropathy: Gabapentin increased to 400 mg tid on 06/14. Continues on 4mg  IV decadron every 8 hrs.              6/14: Gabapentin was increased to 400mg  TID as worst pain is currently the neuropathic pain in RLE. If well tolerated and pain persists, can consider increasing  dose further.   See #3 10. Hypothyroid: Continue supplement.  11. Thrush: Continue Nystatin swish and swallow. Improving.  12. Leukocytosis: 2/2 steroids. 17.3 on 6/15. Continue to monitor weekly.  13. Blood loss anemia: 10.4 on 6/15. Continue to monitor weekly. 14. Hypocalcemia: Ca 8.5 on 6/15. Started on calcium supplement. Monitor weekly.   15. Constipation: Receiving dig stim HS without much benefit. Does receive benefit from soaps suds enema. Will order daily PRN to be uses with bowel program. Patient states she used to use this at home.  16. Insomnia: Improved with amitriptyline. It does make her very groggy during the day but she would like to continue at this time.    LOS: 6 days A FACE TO FACE EVALUATION WAS PERFORMED  Tajah Noguchi P Gracia Saggese 08/16/2019, 11:10 AM

## 2019-08-17 ENCOUNTER — Inpatient Hospital Stay (HOSPITAL_COMMUNITY): Payer: Medicare Other

## 2019-08-17 ENCOUNTER — Inpatient Hospital Stay (HOSPITAL_COMMUNITY): Payer: Medicare Other | Admitting: Occupational Therapy

## 2019-08-17 LAB — CBC
HCT: 35.2 % — ABNORMAL LOW (ref 36.0–46.0)
Hemoglobin: 11.7 g/dL — ABNORMAL LOW (ref 12.0–15.0)
MCH: 30.6 pg (ref 26.0–34.0)
MCHC: 33.2 g/dL (ref 30.0–36.0)
MCV: 92.1 fL (ref 80.0–100.0)
Platelets: 461 10*3/uL — ABNORMAL HIGH (ref 150–400)
RBC: 3.82 MIL/uL — ABNORMAL LOW (ref 3.87–5.11)
RDW: 12.7 % (ref 11.5–15.5)
WBC: 21.1 10*3/uL — ABNORMAL HIGH (ref 4.0–10.5)
nRBC: 0 % (ref 0.0–0.2)

## 2019-08-17 LAB — BASIC METABOLIC PANEL
Anion gap: 9 (ref 5–15)
BUN: 24 mg/dL — ABNORMAL HIGH (ref 8–23)
CO2: 28 mmol/L (ref 22–32)
Calcium: 8.2 mg/dL — ABNORMAL LOW (ref 8.9–10.3)
Chloride: 95 mmol/L — ABNORMAL LOW (ref 98–111)
Creatinine, Ser: 0.84 mg/dL (ref 0.44–1.00)
GFR calc Af Amer: >60 mL/min (ref >60)
GFR calc non Af Amer: >60 mL/min (ref >60)
Glucose, Bld: 134 mg/dL — ABNORMAL HIGH (ref 70–99)
Potassium: 3.3 mmol/L — ABNORMAL LOW (ref 3.5–5.1)
Sodium: 132 mmol/L — ABNORMAL LOW (ref 135–145)

## 2019-08-17 LAB — URINALYSIS, ROUTINE W REFLEX MICROSCOPIC
Bilirubin Urine: NEGATIVE
Glucose, UA: NEGATIVE mg/dL
Hgb urine dipstick: NEGATIVE
Ketones, ur: NEGATIVE mg/dL
Leukocytes,Ua: NEGATIVE
Nitrite: NEGATIVE
Protein, ur: NEGATIVE mg/dL
Specific Gravity, Urine: 1.013 (ref 1.005–1.030)
pH: 7 (ref 5.0–8.0)

## 2019-08-17 MED ORDER — FLEET ENEMA 7-19 GM/118ML RE ENEM
1.0000 | ENEMA | Freq: Every day | RECTAL | Status: DC
  Administered 2019-08-17 – 2019-08-20 (×4): 1 via RECTAL
  Filled 2019-08-17 (×7): qty 1

## 2019-08-17 MED ORDER — POLYETHYLENE GLYCOL 3350 17 G PO PACK
17.0000 g | PACK | Freq: Every day | ORAL | Status: DC
  Administered 2019-08-18 – 2019-08-26 (×9): 17 g via ORAL
  Filled 2019-08-17 (×9): qty 1

## 2019-08-17 MED ORDER — BISACODYL 10 MG RE SUPP: 10.0000 mg | Freq: Every day | RECTAL | Status: DC | PRN

## 2019-08-17 MED ORDER — GABAPENTIN 300 MG PO CAPS
600.0000 mg | ORAL_CAPSULE | Freq: Three times a day (TID) | ORAL | Status: DC
  Administered 2019-08-17 – 2019-08-26 (×27): 600 mg via ORAL
  Filled 2019-08-17 (×27): qty 2

## 2019-08-17 NOTE — Progress Notes (Signed)
Occupational Therapy Session Note  Patient Details  Name: Stacy Wagner MRN: [id number] Date of Birth: [birth year]  Today's Date: 08/17/2019 OT Individual Time: 0930-1000 OT Individual Time Calculation (min): 30 min    Short Term Goals: Week 1:  OT Short Term Goal 1 (Week 1): Pt will be able to pull pants down with min A in standing to prep for toileting. OT Short Term Goal 2 (Week 1): Using a toilet aid, pt will be able to self cleanse with min A. OT Short Term Goal 3 (Week 1): pt will don socks with sock aid with supervision. OT Short Term Goal 4 (Week 1): Pt will don pants over feet with reacher with min A.  Skilled Therapeutic Interventions/Progress Updates:    Patient in bed, alert and ready for therapy session.  She notes pain in right LE at level "5".  Supine to sitting with min A.  Max A to donn TLSO in sitting position.  Sit to stand from bed and short distance ambulation with RW to w/c with CGA.  She completed UB bathing and dressing w/c level with min A.  Grooming/oral care/shampoo cap/hair care tasks with set up.  SPT to toilet with min A.  Max A for donning pants and incontinence brief.  Max A in stance for pants up and hygiene after toileting.  She remained seated in w/c at close of session, seat alarm set and call bell in reach.    Therapy Documentation Precautions:  Precautions Precautions: Back, Fall Precaution Comments: reviewed 3/3 back precautions Required Braces or Orthoses: Spinal Brace Spinal Brace: Thoracolumbosacral orthotic, Applied in sitting position Restrictions Weight Bearing Restrictions: No  Therapy/Group: Individual Therapy  Carlos Levering 08/17/2019, 7:43 AM

## 2019-08-17 NOTE — Progress Notes (Signed)
Physical Therapy Session Note  Patient Details  Name: Stacy Wagner MRN: [id number] Date of Birth: Nov 05, 1940  Today's Date: 08/17/2019 PT Individual Time: 1130-1200 and 1415-1530 PT Individual Time Calculation (min): 30 min and 75 min  Short Term Goals: Week 1:  PT Short Term Goal 1 (Week 1): Pt will perform supine<>sit with minA. PT Short Term Goal 2 (Week 1): Pt will perform bed to chair with minA. PT Short Term Goal 3 (Week 1): Pt will ambulates 16' with CGA. PT Short Term Goal 4 (Week 1): Pt will perform 4 steps with CGA. Week 2:    Week 3:     Skilled Therapeutic Interventions/Progress Updates:    AM SESSION PAIN states she was just given pain meds and is "ok for now".  Did not quantify.  Treatment to tolerance.  Rest breaks given between each activity.  Pt initially OOB in wc in darkened room.  States she like her room dark, finds it restful.  STS from wc w/cga to RW.   Gait 247ft w/RW and cga, very slow cadence, occasional cues for upright posture.  Stand to sit w/cga.    STS from armchair w/cga and short distance gait to stairs w/cga.  Ascends forwards step to approach/descends via backing, step to approach x 8  stairs w/2 rails w/cga.  Moves slowly and cautiously.  Pt transported back to room via wc at end of session. Pt left oob in wc w/alarm belt set and needs in reach   PM SESSION PAIN pt received pain meds at onset of session, pt drowsy during session but able to participate.  Pt initially oob in wc w/daughter and husband at bedside.  Agreeable to treatment, discussed cureent functinal status w/family and recommendation for entering/exiting home using 8stairs w/2 rails vs 4 steps w/no rails due to balance/safety.  Pt transported to gym. Gait 256ft w/RW and cga, occasional cues for full upright posture, occasional mild buckling of r knee w/loading/self recovery and decreased clearance of LLE thru swing w/fatigue.  Increased cadence w/cues but overall very  slow.  Standing balance and strengthening as follows:  Sidestepping length of bars x 4 w/cga and cues for posture bilat UE support. Standing alternating tapping 1in step to promote increased clearance w/swing and bilat LE stability w/stance - repeated 10x bilat w/cga, mild buckling RLE x 2 w/no lob Side step ups w/RLE onto 1inch step for quad/knee control x 15 reps w/bilat UE support Standing heel raises x 8, pt c/o R calf pain.  Returned to sitting and therapist examined, no redness or swelling but + homans and painful to palpation.  Notified PA who examined pt and determined pain of neurgenic vs musculoskeletal origin, not concerning for blood clot. Pt requires several min rest between activities due to fatigue + increased pain  Pt STS from wc w/cga and gait x 139ft to room w/cga, increased cadence w/cues but remains very slow.  Turn/sit to edge of bed w/cga.  In sitting TLSO removed by therapist and discussed process for removal.  Reviewed spinall precautions. Sit to side to supine w/mod assist for LEs.  Pt reposiitoned for comfort.  Family returned to room and therapist  discussed session/ activities w/family. Pt left supine w/rails up x 3, alarm set, bed in lowest position, and needs in reach.  Therapy Documentation Precautions:  Precautions Precautions: Back, Fall Precaution Comments: reviewed 3/3 back precautions Required Braces or Orthoses: Spinal Brace Spinal Brace: Thoracolumbosacral orthotic, Applied in sitting position Restrictions Weight Bearing Restrictions: No  Therapy/Group: Individual Therapy  Callie Fielding, [place] 08/17/2019, 12:37 PM

## 2019-08-17 NOTE — Progress Notes (Signed)
[place] PHYSICAL MEDICINE & REHABILITATION PROGRESS NOTE   Subjective/Complaints:  Pt reports slept better- is HOH- said had little stool last night- there were 2 notes from nursing reporting dig stim done and got good sized BM.   Denies dysuria but wants ot know when RLE pain will get better.    ROS:  Pt denies SOB, abd pain, CP, N/V/C/D, and vision changes  Objective:   No results found. Recent Labs    08/17/19 0656  WBC 21.1*  HGB 11.7*  HCT 35.2*  PLT 461*   Recent Labs    08/17/19 0656  NA 132*  K 3.3*  CL 95*  CO2 28  GLUCOSE 134*  BUN 24*  CREATININE 0.84  CALCIUM 8.2*    Intake/Output Summary (Last 24 hours) at 08/17/2019 0913 Last data filed at 08/17/2019 0755 Gross per 24 hour  Intake 660 ml  Output 319 ml  Net 341 ml     Physical Exam: Vital Signs Blood pressure 124/68, pulse 76, temperature 97.8 F (36.6 C), resp. rate 18, height 5\' 8"  (1.727 m), weight 72 kg, SpO2 98 %. General: Alert and oriented x 3, No apparent distress- sitting up in bed; appropriate, HOH, NAD HEENT: conjugate gaze Heart: RRR Chest: CTA B/L- no W/R/R- good air movement Abdomen: Soft, NT, ND, (+)BS  Extremities: No clubbing, cyanosis, or edema. Pulses are 2+ Skin: Honeycomb dressing along spinal incision without drainage. Incision sites in right iliac crest with skin glue, C/D/I- not assessed today Neuro: Pt is cognitively appropriate with normal insight, memory, and awareness. Cranial nerves 2-12 are intact. Sensory exam is normal. Fine motor coordination is intact. No tremors. Motor function is grossly 5/5 in bilateral upper extremities. LLE grossly 4+/5. RLE with HF and KE at 2/5 and DF/DF 4/5- pain limited. RLE tender to even light touch.  MSK: not Wearing TLSO since in bed Psych: Pt's affect is appropriate. Pt is cooperative   Assessment/Plan: 1. Functional deficits secondary to impaired mobility and ADLs secondary to L5-S1 anterior lumbar interbody fusion and  L1-L5 anterolateral lumbar interbody fusion for stage 1 scoliosis, complicated by dysesthesias in RLE, which require 3+ hours per day of interdisciplinary therapy in a comprehensive inpatient rehab setting.  Physiatrist is providing close team supervision and 24 hour management of active medical problems listed below.  Physiatrist and rehab team continue to assess barriers to discharge/monitor patient progress toward functional and medical goals  Care Tool:  Bathing    Body parts bathed by patient: Face, Right upper leg, Left upper leg, Right arm, Left arm   Body parts bathed by helper: Right upper leg, Left upper leg, Buttocks     Bathing assist Assist Level: Set up assist     Upper Body Dressing/Undressing Upper body dressing   What is the patient wearing?: Orthosis Orthosis activity level: Performed by patient  Upper body assist Assist Level: Moderate Assistance - Patient 50 - 74%    Lower Body Dressing/Undressing Lower body dressing      What is the patient wearing?: Incontinence brief, Pants     Lower body assist Assist for lower body dressing: Moderate Assistance - Patient 50 - 74%     Toileting Toileting    Toileting assist Assist for toileting: Moderate Assistance - Patient 50 - 74%     Transfers Chair/bed transfer  Transfers assist     Chair/bed transfer assist level: Contact Guard/Touching assist Chair/bed transfer assistive device: Programmer, multimedia   Ambulation assist  Assist level: Minimal Assistance - Patient > 75% Assistive device: Walker-rolling Max distance: 127ft   Walk 10 feet activity   Assist     Assist level: Contact Guard/Touching assist Assistive device: Walker-rolling   Walk 50 feet activity   Assist Walk 50 feet with 2 turns activity did not occur: Safety/medical concerns  Assist level: Minimal Assistance - Patient > 75% Assistive device: Walker-rolling    Walk 150 feet activity   Assist Walk  150 feet activity did not occur: Safety/medical concerns  Assist level: Minimal Assistance - Patient > 75% Assistive device: Walker-rolling    Walk 10 feet on uneven surface  activity   Assist     Assist level: Minimal Assistance - Patient > 75% Assistive device: Aeronautical engineer Will patient use wheelchair at discharge?: No             Wheelchair 50 feet with 2 turns activity    Assist            Wheelchair 150 feet activity     Assist          Blood pressure 124/68, pulse 76, temperature 97.8 F (36.6 C), resp. rate 18, height 5\' 8"  (1.727 m), weight 72 kg, SpO2 98 %.  Medical Problem List and Plan: 1.  Impaired mobility and ADLs secondary to L5-S1 anterior lumbar interbody fusion and L1-L5 anterolateral lumbar interbody fusion for stage 1 scoliosis, complicated by dysesthesias in RLE.             -patient may shower but incision must be covered             -ELOS/Goals: modI 12-16 days  -Continue CIR therapies. 2.  Antithrombotics: -DVT/anticoagulation:  Pharmaceutical: Lovenox- d/ced since patient ambulated 300 feet 6/17 3. Pain Management: Continue Oxycodone prn--d/c hydrodocone/hydromorphone. On valium and robaxin prn for muscle spasms.   6/16: Complaining of painful muscle spasms this morning. Valium was discontinued, can consider restarting, but prefer use of less sedating Methocarbamol at this time. Encourage patient to ask if she needs.   6/17: patient tearful that pain is very sever at night and prevented her from sleeping. Started amitriptyline 10mg  at night to help with insomnia and neuropathic pain.   6/18: Not in pain this morning. Hopefully amitriptyline will help with pain as well.   6/19: Says pain is still present (rated 5/10 in therapy today) but manageable.   6/20: Continues to have painful right lower extremity dysesthesias. On Tramadol and Gabapentin TID and has prn Oxycodone and Methocarbamol which she has  been using.   6/21- increased Gabapentin to 600 mg TID for nerve pain 4. Mood: LCSW to follow for evaluation and support. Anxious at times. Klonopin 0.25mg  prn ordered.              -antipsychotic agents: N/A 5. Neuropsych: This patient is capable of making decisions on her own behalf. 6. Skin/Wound Care: Honeycomb dressing along spinal incision without drainage. Incision sites in right iliac crest with skin glue. Continue to monitor. Can remove dressing 6/20 7. Fluids/Electrolytes/Nutrition: Monitor I/O.  8. HTN: Monitor BP tid--continue Maxzide. Labile- continue to monitor.  9. Neuropathy: Gabapentin increased to 400 mg tid on 06/14. Continues on 4mg  IV decadron every 8 hrs.              6/14: Gabapentin was increased to 400mg  TID as worst pain is currently the neuropathic pain in RLE. If well tolerated and pain persists, can consider increasing  dose further.   6/21- increased gabapentin to 600 mg TID 10. Hypothyroid: Continue supplement.  11. Thrush: Continue Nystatin swish and swallow. Improving.  12. Leukocytosis: 2/2 steroids. 17.3 on 6/15. Continue to monitor weekly.  13. Blood loss anemia: 10.4 on 6/15. Continue to monitor weekly. 14. Hypocalcemia: Ca 8.5 on 6/15. Started on calcium supplement. Monitor weekly.   15. Constipation: Receiving dig stim HS without much benefit. Does receive benefit from soaps suds enema. Will order daily PRN to be uses with bowel program. Patient states she used to use this at home.  16. Insomnia: Improved with amitriptyline. It does make her very groggy during the day but she would like to continue at this time.    6/21- slept better 17. Leukocytosis  6/21- pt's WBC is up to 21k- was 17k- will check U/A and Cx and have PA check incision if possible to make sure not cause of infection- not on prednisone.  Is afebrile and said doesn't feel bad.   LOS: 7 days A FACE TO FACE EVALUATION WAS PERFORMED  Stacy Wagner 08/17/2019, 9:13 AM

## 2019-08-17 NOTE — Progress Notes (Signed)
Digital stim and enema given. Moderate amount of soft stool noted to glove. Small amount of soft stool out into commode. Pt encouraged to remain on toilet to try to pass stool.

## 2019-08-17 NOTE — Progress Notes (Signed)
Pt had medium BM. No stool noted in rectal vault

## 2019-08-17 NOTE — Progress Notes (Signed)
Digital stimulation done while patient was sitting on toilet; obtained moderate amount of soft, brown stool. Pericare done.

## 2019-08-17 NOTE — Progress Notes (Signed)
Occupational Therapy Session Note  Patient Details  Name: Stacy Wagner MRN: [id number] Date of Birth: 06-20-1940  Today's Date: 08/17/2019 OT Individual Time: 1300-1410 OT Individual Time Calculation (min): 70 min    Short Term Goals: Week 1:  OT Short Term Goal 1 (Week 1): Pt will be able to pull pants down with min A in standing to prep for toileting. OT Short Term Goal 2 (Week 1): Using a toilet aid, pt will be able to self cleanse with min A. OT Short Term Goal 3 (Week 1): pt will don socks with sock aid with supervision. OT Short Term Goal 4 (Week 1): Pt will don pants over feet with reacher with min A.  Skilled Therapeutic Interventions/Progress Updates:    Pt greeted at time of session sitting up in wheelchair agreeable to OT session to address toileting, mobility, and standing balance. Toileting Mod A overall with CGA to ambulate to bathroom and transfer to toilet, Mod A for donning/doffing clothing and able to wipe since pt only urinated, no BM. Ambulated to therapy gym with CGA with RW and participated in dynamic standing with [employer] for reaching in all planes with contralateral side to improve ability to reach for items while maintaining back precautions and improve standing balance/tolerance. Pt stood approximately 10 minutes without rest break. SCIFIT in standing level 1.5 for BUE reciprocal exercise to improve BUE strength and cardio endurance. Once back in room, collaboration with daughter regarding home set up for bathroom and which shower to use, measurements given and showed daughter two options for both TTB and shower seat. Pt back in room with alarm on, call bell in reach, all needs met.   Therapy Documentation Precautions:  Precautions Precautions: Back, Fall Precaution Comments: reviewed 3/3 back precautions Required Braces or Orthoses: Spinal Brace Spinal Brace: Thoracolumbosacral orthotic, Applied in sitting position Restrictions Weight Bearing Restrictions:  No    Therapy/Group: Individual Therapy  Viona Gilmore 08/17/2019, 3:23 PM

## 2019-08-18 ENCOUNTER — Inpatient Hospital Stay (HOSPITAL_COMMUNITY): Payer: Medicare Other

## 2019-08-18 ENCOUNTER — Inpatient Hospital Stay (HOSPITAL_COMMUNITY): Payer: Medicare Other | Admitting: Occupational Therapy

## 2019-08-18 LAB — CBC WITH DIFFERENTIAL/PLATELET
Abs Immature Granulocytes: 0.44 10*3/uL — ABNORMAL HIGH (ref 0.00–0.07)
Basophils Absolute: 0 10*3/uL (ref 0.0–0.1)
Basophils Relative: 0 %
Eosinophils Absolute: 0.4 10*3/uL (ref 0.0–0.5)
Eosinophils Relative: 3 %
HCT: 36.9 % (ref 36.0–46.0)
Hemoglobin: 12.1 g/dL (ref 12.0–15.0)
Immature Granulocytes: 3 %
Lymphocytes Relative: 14 %
Lymphs Abs: 2.3 10*3/uL (ref 0.7–4.0)
MCH: 30.6 pg (ref 26.0–34.0)
MCHC: 32.8 g/dL (ref 30.0–36.0)
MCV: 93.4 fL (ref 80.0–100.0)
Monocytes Absolute: 1.3 10*3/uL — ABNORMAL HIGH (ref 0.1–1.0)
Monocytes Relative: 8 %
Neutro Abs: 11.6 10*3/uL — ABNORMAL HIGH (ref 1.7–7.7)
Neutrophils Relative %: 72 %
Platelets: 439 10*3/uL — ABNORMAL HIGH (ref 150–400)
RBC: 3.95 MIL/uL (ref 3.87–5.11)
RDW: 12.8 % (ref 11.5–15.5)
WBC: 16 10*3/uL — ABNORMAL HIGH (ref 4.0–10.5)
nRBC: 0 % (ref 0.0–0.2)

## 2019-08-18 LAB — URINE CULTURE: Culture: 10000 — AB

## 2019-08-18 NOTE — Patient Care Conference (Signed)
Inpatient RehabilitationTeam Conference and Plan of Care Update Date: 08/18/2019   Time: 1:41 PM    Patient Name: Stacy Wagner      Medical Record Number: [id number]  Date of Birth: [birth year] Sex: Female         Room/Bed: 4M02C/4M02C-01 Payor Info: Payor: MEDICARE / Plan: MEDICARE PART A AND B / Product Type: *No Product type* /    Admit Date/Time:  08/10/2019  4:28 PM  Primary Diagnosis:  Scoliosis of lumbosacral spine  Patient Active Problem List   Diagnosis Date Noted   Scoliosis of lumbosacral spine 08/10/2019   Idiopathic scoliosis of thoracolumbar region 08/04/2019   Colon adenomas 10/21/2018   Cancer of lateral margin of anterior two-thirds of tongue ([place]) 01/15/2018   Bursitis of left shoulder 08/10/2015   Postmenopausal 10/15/2014   Dizziness and giddiness 08/13/2014   Nausea without vomiting 08/13/2014   GERD (gastroesophageal reflux disease) 10/08/2013   Obesity (BMI 30-39.9) 10/01/2012   VITAMIN D DEFICIENCY 09/08/2009   Hyperlipidemia 09/08/2009   FREQUENCY, URINARY 09/08/2009   UNS ADVRS EFF OTH RX MEDICINAL&BIOLOGICAL SBSTNC 09/08/2009   INSOMNIA 08/25/2008   WEIGHT GAIN 08/25/2008   DEGENERATIVE JOINT DISEASE, CERVICAL SPINE 07/15/2008   Essential hypertension 06/28/2008   OSTEOARTHRITIS 06/28/2008   NECK PAIN 06/28/2008   Hypothyroidism 08/20/2007   LABYRINTHITIS, CHRONIC 08/20/2007   EDEMA 08/20/2007   HEADACHE 08/20/2007   SYNDROME, CARPAL TUNNEL 08/15/2006    Expected Discharge Date: Expected Discharge Date: 08/26/19  Team Members Present: Physician leading conference: Dr. Courtney Heys Care Coodinator Present: [name], LCSWA;Teletha Petrea Creig Hines, RN, BSN, [place] Nurse Present: Other (comment) Dina Rich, RN) PT Present: Tereasa Coop, PT OT Present: Lillia Corporal, OT PPS Coordinator present : Gunnar Fusi, SLP     Current Status/Progress Goal Weekly Team Focus  Bowel/Bladder   Pt is continent of B/B. pt able  to empty bladder when getting OOB for the toilet, LBM 08/17/19  Pt will be contient of B/B, with normal bowel pattern.  Q2h toileting?PVR   Swallow/Nutrition/ Hydration             ADL's   Mod for LB dressing w/ reacher, total for socks/shoes, transfers CGA with RW, UB dressing Min for shirt with assist for TLSO, toileting Mod for clothing management  supervision  clothing management & LB dressing, need to attempt shower, family ed/training, donning brace   Mobility   minA/modA bed mobility, functional trasnfers, CGA gait 300', CGA 4 steps with BHRs. Moves very slowly.  Supervision  Efficiency of movement. bed mobility, endurance, DC prep.   Communication             Safety/Cognition/ Behavioral Observations            Pain   pt c/o 10/10 RLE pain. increased pain with touch and movement.PRN oxy effective  Pt pain will be less than 3.  Assess pain Qshift/PRn   Skin   Pt has surgical incision to spine. Pt has 4 in incision to the LLQ of the abdomen. 2 sm incisions to upper right flank  Skin will be free of infection and further breakdown.  assess skin qhift/prn    Rehab Goals Patient on target to meet rehab goals: Yes *See Care Plan and progress notes for long and short-term goals.     Barriers to Discharge  Current Status/Progress Possible Resolutions Date Resolved   Nursing                  PT  OT                  SLP                Care Coordinator                Discharge Planning/Teaching Needs:  D/c to home with support from dtr and husband.  Family education as recommended   Team Discussion:  Continent of B/B. Pt bowels are now moving consistently. Low threshold for pain, limiting with therapy. Pt can ambulate up to 300 ft. Working on getting in/out of bed. May need a hospital bed at discharge, TBD. Daughter will be available to assist pt at discharge.  Revisions to Treatment Plan:  none    Medical Summary Current Status: tearful all morning-  finally having BMs- up all night to bathroom- giving oxy/tramadol alternating with Robaxin; voiding easily Weekly Focus/Goal: OT- still mod-max A LB ADLs; no shower yet- afraid to; focusing on brace; walking to bathroom Sup;  Barriers to Discharge: Behavior;Home enviroment access/layout;Decreased family/caregiver support;Neurogenic Bowel & Bladder;Wound care;Weight bearing restrictions  Barriers to Discharge Comments: leukocytosis improving- off steroids- 6/30 d/c Possible Resolutions to Barriers: PT-tearful all session- walks 300 ft CGA-min A with RW- bed transfers worst- hospital bed? not sure if needs;   Continued Need for Acute Rehabilitation Level of Care: The patient requires daily medical management by a physician with specialized training in physical medicine and rehabilitation for the following reasons: Direction of a multidisciplinary physical rehabilitation program to maximize functional independence : Yes Medical management of patient stability for increased activity during participation in an intensive rehabilitation regime.: Yes Analysis of laboratory values and/or radiology reports with any subsequent need for medication adjustment and/or medical intervention. : Yes   I attest that I was present, lead the team conference, and concur with the assessment and plan of the team.   Cristi Loron 08/18/2019, 1:41 PM

## 2019-08-18 NOTE — Progress Notes (Signed)
[place] PHYSICAL MEDICINE & REHABILITATION PROGRESS NOTE   Subjective/Complaints:  Pain was "duller" yesterday- as bad as before/a few days- shooting pain.   BM last night- with bowel program.   Gets up to bathroom to void/pee.   All pain in RLE ROS:    Pt denies SOB, abd pain, CP, N/V/C/D, and vision changes  Objective:   No results found. Recent Labs    08/17/19 0656  WBC 21.1*  HGB 11.7*  HCT 35.2*  PLT 461*   Recent Labs    08/17/19 0656  NA 132*  K 3.3*  CL 95*  CO2 28  GLUCOSE 134*  BUN 24*  CREATININE 0.84  CALCIUM 8.2*    Intake/Output Summary (Last 24 hours) at 08/18/2019 0914 Last data filed at 08/18/2019 0700 Gross per 24 hour  Intake 620 ml  Output --  Net 620 ml     Physical Exam: Vital Signs Blood pressure (!) 118/54, pulse 71, temperature 97.7 F (36.5 C), temperature source Oral, resp. rate 17, height 5\' 8"  (1.727 m), weight 72 kg, SpO2 98 %. General: Alert and oriented x 3,sitting up in bed; appropriate, NAD HEENT: conjugate gaze Heart: RRR Chest: CTA B/L- no W/R/R- good air movement Abdomen: Soft, NT, ND, (+)BS  Extremities: No clubbing, cyanosis, or edema. Pulses are 2+ Skin: dressing incisions  In place Neuro: Pt is cognitively appropriate with normal insight, memory, and awareness. Cranial nerves 2-12 are intact. Sensory exam is normal. Fine motor coordination is intact. No tremors. Motor function is grossly 5/5 in bilateral upper extremities. LLE grossly 4+/5. RLE with HF and KE at 2/5 and DF/DF 4/5- pain limited. RLE tender to even light touch.  MSK: not Wearing TLSO since in bed Psych: pt appropriate   Assessment/Plan: 1. Functional deficits secondary to impaired mobility and ADLs secondary to L5-S1 anterior lumbar interbody fusion and L1-L5 anterolateral lumbar interbody fusion for stage 1 scoliosis, complicated by dysesthesias in RLE, which require 3+ hours per day of interdisciplinary therapy in a comprehensive inpatient  rehab setting.  Physiatrist is providing close team supervision and 24 hour management of active medical problems listed below.  Physiatrist and rehab team continue to assess barriers to discharge/monitor patient progress toward functional and medical goals  Care Tool:  Bathing    Body parts bathed by patient: Face, Right upper leg, Left upper leg, Right arm, Left arm   Body parts bathed by helper: Right upper leg, Left upper leg, Buttocks     Bathing assist Assist Level: Set up assist     Upper Body Dressing/Undressing Upper body dressing   What is the patient wearing?: Orthosis Orthosis activity level: Performed by patient  Upper body assist Assist Level: Moderate Assistance - Patient 50 - 74%    Lower Body Dressing/Undressing Lower body dressing      What is the patient wearing?: Incontinence brief, Pants     Lower body assist Assist for lower body dressing: Moderate Assistance - Patient 50 - 74%     Toileting Toileting    Toileting assist Assist for toileting: Moderate Assistance - Patient 50 - 74%     Transfers Chair/bed transfer  Transfers assist     Chair/bed transfer assist level: Contact Guard/Touching assist Chair/bed transfer assistive device: Programmer, multimedia   Ambulation assist      Assist level: Contact Guard/Touching assist Assistive device: Walker-rolling Max distance: 230   Walk 10 feet activity   Assist     Assist level: Contact Guard/Touching  assist Assistive device: Walker-rolling   Walk 50 feet activity   Assist Walk 50 feet with 2 turns activity did not occur: Safety/medical concerns  Assist level: Contact Guard/Touching assist Assistive device: Walker-rolling    Walk 150 feet activity   Assist Walk 150 feet activity did not occur: Safety/medical concerns  Assist level: Contact Guard/Touching assist Assistive device: Walker-rolling    Walk 10 feet on uneven surface  activity   Assist      Assist level: Minimal Assistance - Patient > 75% Assistive device: Aeronautical engineer Will patient use wheelchair at discharge?: No             Wheelchair 50 feet with 2 turns activity    Assist            Wheelchair 150 feet activity     Assist          Blood pressure (!) 118/54, pulse 71, temperature 97.7 F (36.5 C), temperature source Oral, resp. rate 17, height 5\' 8"  (1.727 m), weight 72 kg, SpO2 98 %.  Medical Problem List and Plan: 1.  Impaired mobility and ADLs secondary to L5-S1 anterior lumbar interbody fusion and L1-L5 anterolateral lumbar interbody fusion for stage 1 scoliosis, complicated by dysesthesias in RLE.             -patient may shower but incision must be covered             -ELOS/Goals: modI 12-16 days  -Continue CIR therapies. 2.  Antithrombotics: -DVT/anticoagulation:  Pharmaceutical: Lovenox- d/ced since patient ambulated 300 feet 6/17 3. Pain Management: Continue Oxycodone prn--d/c hydrodocone/hydromorphone. On valium and robaxin prn for muscle spasms.   6/16: Complaining of painful muscle spasms this morning. Valium was discontinued, can consider restarting, but prefer use of less sedating Methocarbamol at this time. Encourage patient to ask if she needs.   6/17: patient tearful that pain is very sever at night and prevented her from sleeping. Started amitriptyline 10mg  at night to help with insomnia and neuropathic pain.   6/18: Not in pain this morning. Hopefully amitriptyline will help with pain as well.   6/19: Says pain is still present (rated 5/10 in therapy today) but manageable.   6/20: Continues to have painful right lower extremity dysesthesias. On Tramadol and Gabapentin TID and has prn Oxycodone and Methocarbamol which she has been using.   6/21- increased Gabapentin to 600 mg TID for nerve pain 4. Mood: LCSW to follow for evaluation and support. Anxious at times. Klonopin 0.25mg  prn ordered.               -antipsychotic agents: N/A 5. Neuropsych: This patient is capable of making decisions on her own behalf. 6. Skin/Wound Care: Honeycomb dressing along spinal incision without drainage. Incision sites in right iliac crest with skin glue. Continue to monitor. Can remove dressing 6/20 7. Fluids/Electrolytes/Nutrition: Monitor I/O.  8. HTN: Monitor BP tid--continue Maxzide. Labile- continue to monitor.  9. Neuropathy: Gabapentin increased to 400 mg tid on 06/14. Continues on 4mg  IV decadron every 8 hrs.              6/14: Gabapentin was increased to 400mg  TID as worst pain is currently the neuropathic pain in RLE. If well tolerated and pain persists, can consider increasing dose further.   6/21- increased gabapentin to 600 mg TID 10. Hypothyroid: Continue supplement.  11. Thrush: Continue Nystatin swish and swallow. Improving.  12. Leukocytosis: 2/2 steroids. 17.3 on 6/15. Continue  to monitor weekly.  13. Blood loss anemia: 10.4 on 6/15. Continue to monitor weekly. 14. Hypocalcemia: Ca 8.5 on 6/15. Started on calcium supplement. Monitor weekly.   15. Constipation: Receiving dig stim HS without much benefit. Does receive benefit from soaps suds enema. Will order daily PRN to be uses with bowel program. Patient states she used to use this at home.   6/22- will add enema daily since did at home 16. Insomnia: Improved with amitriptyline. It does make her very groggy during the day but she would like to continue at this time.    6/21- slept better 17. Leukocytosis  6/21- pt's WBC is up to 21k- was 17k- will check U/A and Cx and have PA check incision if possible to make sure not cause of infection- not on prednisone.  Is afebrile and said doesn't feel bad.   6/22- U/A is completely negative- just finished IV steroids, so likely the cause of leukocytosis- will monitor with daily CBC to make sure down trending- is pending now.   LOS: 8 days A FACE TO FACE EVALUATION WAS PERFORMED  Stacy Wagner 08/18/2019, 9:14 AM

## 2019-08-18 NOTE — Progress Notes (Signed)
Digital stimulation performed and enema given. Moderate amount of soft stool noted. Pt given timr to attempt to pass further stool.

## 2019-08-18 NOTE — Progress Notes (Signed)
Digital stim performed, small amount of soft stool out. No further stool noted.

## 2019-08-18 NOTE — Progress Notes (Signed)
Occupational Therapy Weekly Progress Note  Patient Details  Name: Stacy Wagner MRN: [id number] Date of Birth: 14-Dec-1940  Beginning of progress report period: August 11, 2019 End of progress report period: August 18, 2019  Today's Date: 08/18/2019 OT Individual Time: 1105-1130 and 1259-1357 OT Individual Time Calculation (min): 25 min and 58 min   Patient has met 0 of 4 short term goals. Pt is progressing toward her goals for donning socks and performing clothing management in standing for toileting, but pt continues to be Mod A for LB ADLs. Not able to address self cleansing with aide yet d/t no BM with OT staff and pt is able to self cleanse for urine without assistance. Pain in her RLE from nerve pain has been limiting her ability to perform tasks in standing such as donning doffing clothing but her standing balance and tolerance has improved. Pt is progressing toward her STGs/LTGs.   Patient continues to demonstrate the following deficits: muscle weakness,   and decreased standing balance and decreased postural control, pain in RLE and therefore will continue to benefit from skilled OT intervention to enhance overall performance with BADL.  Patient progressing toward long term goals..  Continue plan of care.  OT Short Term Goals Week 1:  OT Short Term Goal 1 (Week 1): Pt will be able to pull pants down with min A in standing to prep for toileting. OT Short Term Goal 1 - Progress (Week 1): Progressing toward goal OT Short Term Goal 2 (Week 1): Using a toilet aid, pt will be able to self cleanse with min A. OT Short Term Goal 2 - Progress (Week 1): Not met OT Short Term Goal 3 (Week 1): pt will don socks with sock aid with supervision. OT Short Term Goal 3 - Progress (Week 1): Not met OT Short Term Goal 4 (Week 1): Pt will don pants over feet with reacher with min A. OT Short Term Goal 4 - Progress (Week 1): Progressing toward goal Week 2:  OT Short Term Goal 1 (Week 2): Pt will be able  to pull pants down with min A in standing to prep for toileting. OT Short Term Goal 2 (Week 2): Pt will don socks with sock aid with supervision. OT Short Term Goal 3 (Week 2): Pt will perform LB dressing with Min A with use of reacher OT Short Term Goal 4 (Week 2): Pt will perform UB/LB bathing with Min A with use of AE as needed  Skilled Therapeutic Interventions/Progress Updates:    Pt greeted at time of session sitting up in her wheelchair in 10/10 pain, nursing notified and stated the pt had been complaining of pain throughout the day, pain meds not due for another hour and pt notified. Pt stated pain is primarily in R knee, warm washcloth placed on knee to decrease pain and provide sensory input to RLE, mildly improved pain. Pt did need to toilet, took wheelchair to bathroom this date d/t increase in pain levels and performed SPT to/from toilet with CGA, Mod A for clothing management to don/doff and independent with cleansing. Brought back to room via wheelchair with alarm on, call bell in reach, all needs met.  Session 2: Pt greeted at time of session sitting up in her wheelchair with pain improved from the morning, stating she had just received pain meds. However, pain was limiting throughout session with standing activities and transfers. Practiced donning/doffing her TLSO in front of mirror for feedback with pt performing with Min/Mod A.  Discussed home set up for walk in vs. Tub shower combo, pt will likely use tub shower. Pt brought down to bathroom with tub shower and after demonstration pt transferred to TTB with Min A but Mod A to bring BLEs over tub unit d/t pain in RLE. Simulated bathing activity with plans to do a shower with the patient soon. Transferred back to wheelchair in the same manner, and brought back to her bathroom d/t pt requests to toilet. Clothing management standing at toilet with Min-Mod A. Note that pain did limit throughout session and pt does seem lethargic. Pt up in  wheelchair with alarm on, call bell in reach, all needs met.   Therapy Documentation Precautions:  Precautions Precautions: Back, Fall Precaution Comments: reviewed 3/3 back precautions Required Braces or Orthoses: Spinal Brace Spinal Brace: Thoracolumbosacral orthotic, Applied in sitting position Restrictions Weight Bearing Restrictions: No    Therapy/Group: Individual Therapy  Viona Gilmore 08/18/2019, 12:28 PM

## 2019-08-18 NOTE — Plan of Care (Signed)
  Problem: Consults Goal: RH GENERAL PATIENT EDUCATION Description: Patient will be aware of their progress and know their expected goals and expected discharge date.    Outcome: Progressing   Problem: RH BOWEL ELIMINATION Goal: RH STG MANAGE BOWEL WITH ASSISTANCE Description: STG Manage Bowel with mod Assistance. Outcome: Progressing Goal: RH STG MANAGE BOWEL W/MEDICATION W/ASSISTANCE Description: STG Manage Bowel with Medication with mod Assistance. Outcome: Progressing Flowsheets (Taken 08/18/2019 1037) STG: Pt will manage bowels with medication with assistance: 3-Moderate assistance   Problem: RH BLADDER ELIMINATION Goal: RH STG MANAGE BLADDER WITH ASSISTANCE Description: Patient will be continent of bladder prior to discharge and able to empty bladder when urinating.  Outcome: Progressing Goal: RH STG MANAGE BLADDER WITH MEDICATION WITH ASSISTANCE Description: STG Manage Bladder With Medication With min Assistance. Outcome: Progressing   Problem: RH SKIN INTEGRITY Goal: RH STG SKIN FREE OF INFECTION/BREAKDOWN Description: Patient will remain free of skin tears and no new signs or symptoms of infection will appear.  Outcome: Progressing Goal: RH STG MAINTAIN SKIN INTEGRITY WITH ASSISTANCE Description: STG Maintain Skin Integrity With min Assistance. Outcome: Progressing Goal: RH STG ABLE TO PERFORM INCISION/WOUND CARE W/ASSISTANCE Description: STG Able To Perform Incision/Wound Care With min Assistance. Outcome: Progressing   Problem: RH SKIN INTEGRITY Goal: RH STG SKIN FREE OF INFECTION/BREAKDOWN Description: Patient will remain free of skin tears and no new signs or symptoms of infection will appear.  Outcome: Progressing Goal: RH STG MAINTAIN SKIN INTEGRITY WITH ASSISTANCE Description: STG Maintain Skin Integrity With min Assistance. Outcome: Progressing Goal: RH STG ABLE TO PERFORM INCISION/WOUND CARE W/ASSISTANCE Description: STG Able To Perform Incision/Wound Care  With min Assistance. Outcome: Progressing   Problem: RH SAFETY Goal: RH STG ADHERE TO SAFETY PRECAUTIONS W/ASSISTANCE/DEVICE Description: Patient will refrain from falls and ask for assistance as needed.  Outcome: Progressing Goal: RH STG DECREASED RISK OF FALL WITH ASSISTANCE Description: STG Decreased Risk of Fall With mod I Assistance. Outcome: Progressing   Problem: RH PAIN MANAGEMENT Goal: RH STG PAIN MANAGED AT OR BELOW PT'S PAIN GOAL Description: Less than 3 out of 10 Outcome: Progressing   Problem: RH KNOWLEDGE DEFICIT GENERAL Goal: RH STG INCREASE KNOWLEDGE OF SELF CARE AFTER HOSPITALIZATION Description: Patient will know how to follow up with appointments and aware of how to take care of herself and prevent any unwanted hospital readmissions.  Outcome: Progressing

## 2019-08-18 NOTE — Progress Notes (Signed)
Physical Therapy Weekly Progress Note  Patient Details  Name: Stacy Wagner MRN: [id number] Date of Birth: 07/30/1940  Beginning of progress report period: August 11, 2019 End of progress report period: August 18, 2019  Today's Date: 08/18/2019 PT Individual Time: 3825-0539 and 1445-1530 PT Individual Time Calculation (min): 58 min  And 45 min  Patient has met 3 of 4 short term goals.  Pt has made progress toward therapy goals, meeting all mobility goals except bed mobility, as pt still requiring modA for supine to sit from flat surface. Pt limited by difficulty managing pain levels and also frequently presenting with fatigue and with difficulty keeping eyes open during session. Pt ambulates +300' with RW and CGA/minA, but has very slow gait pattern with short bilateral stride lengths. Focus this week in therapy will be pain management, bed mobility, BLE strength, and more efficient gait pattern.  Patient continues to demonstrate the following deficits muscle weakness, decreased cardiorespiratoy endurance and decreased sitting balance, decreased standing balance and decreased balance strategies and therefore will continue to benefit from skilled PT intervention to increase functional independence with mobility.  Patient progressing toward long term goals..  Continue plan of care.  PT Short Term Goals Week 1:  PT Short Term Goal 1 (Week 1): Pt will perform supine<>sit with minA. PT Short Term Goal 1 - Progress (Week 1): Progressing toward goal PT Short Term Goal 2 (Week 1): Pt will perform bed to chair with minA. PT Short Term Goal 2 - Progress (Week 1): Met PT Short Term Goal 3 (Week 1): Pt will ambulates 41' with CGA. PT Short Term Goal 3 - Progress (Week 1): Met PT Short Term Goal 4 (Week 1): Pt will perform 4 steps with CGA. PT Short Term Goal 4 - Progress (Week 1): Met Week 2:  PT Short Term Goal 1 (Week 2): STGs = LTGs due to ELOS  Skilled Therapeutic Interventions/Progress Updates:      1st Session: Pt received supine in bed, reporting significant pain in RLE and very tearful, but agreeable to therapy. RN present and provides pain meds. Supine to sit with modA with cues for logrolling and hand placement. Sit to stand with supervision and stand step transfer to toilet with RW and CGA. Pt requires totalA from PT to pull up pants.   WC transport to therapy gym for energy conservation and pain management. Pt performs stand pivot transfer to mat table with CGA. Sit to supine with modA. Pt lays in semi reclined position with wedge for comfort and PT provides PROM of RLE for pain relief. Pt reports improvement in pain symptoms when RLE brought into flexion, with knee flexed as well. Pain symptoms reproduced with knee extension.   Supine to sit with modA. Stand pivot transfer back to [facility] with CGA and WC transport back to room. Pt left seated in WC with alarm intact and all needs within reach.  2nd Session: Pt received seated in WC and agreeable to therapy. Reports 6/10 pain in RLE. PT provides rest breaks as needed to manage pain symptoms. Sit to stand with CGA and cues for hand placement and body mechanics. Pt ambulates 180' with RW and CGA. Pt gait speed is extremely slow and pt demos shuffling gait pattern. PT provides minA to propel RW for more functional gait pattern but does not provide pt with any additional balancing assistance.   Pt perform NMR for static stand balance with focus on retraining appropriate muscle activation patterns. Pt performs repeated sit to  stand transfer with level 2 theraband around distal thighs for glute med activation. Pt also performs AAROM in sitting, rolling rubber ball under R foot as leg extends and then flexes, encouraging neural movement to facilitate pain free movement. Pt performs Seated clamshells with level 2 theraband 3x10. Pt performs standing marching in place with focus on posture and hip/knee extension while in stance phase, with mirror  provided for visual feedback. Pt demos tendency to remain in flexed knee posture, especially when cervical extension encouraged. Pt able to extend knees in standing but only with flexed neck.  Pt ambulates 180' back to room with same assist. Left seated in [facility] with alarm intact and all needs within reach.  Therapy Documentation Precautions:  Precautions Precautions: Back, Fall Precaution Comments: reviewed 3/3 back precautions Required Braces or Orthoses: Spinal Brace Spinal Brace: Thoracolumbosacral orthotic, Applied in sitting position Restrictions Weight Bearing Restrictions: No   Therapy/Group: Individual Therapy  Breck Coons, PT, DPT 08/18/2019, 12:43 PM

## 2019-08-18 NOTE — Progress Notes (Signed)
Patient ID: TOCARRA GASSEN, female   DOB: 22-Nov-1940, 79 y.o.   MRN: [id number]   SW met with pt in room to provide updates from team conference, and inform on d/c date 6/30. SW informed pt her dtr will be contacted to provide updates.  SW received phone call from pt dtr Lorenza Burton [phone]) requesting updates from team conference. SW provided updates. SW informed there will continue to be  *Family education Mon (6/28) and Tues 6/29 10am until therapy is completed.   [name], MSW, [place] Office: [phone] Cell: [phone] Fax: [phone]

## 2019-08-19 ENCOUNTER — Inpatient Hospital Stay (HOSPITAL_COMMUNITY): Payer: Medicare Other | Admitting: Occupational Therapy

## 2019-08-19 ENCOUNTER — Inpatient Hospital Stay (HOSPITAL_COMMUNITY): Payer: Medicare Other

## 2019-08-19 LAB — CBC WITH DIFFERENTIAL/PLATELET
Abs Immature Granulocytes: 0.39 10*3/uL — ABNORMAL HIGH (ref 0.00–0.07)
Basophils Absolute: 0 10*3/uL (ref 0.0–0.1)
Basophils Relative: 0 %
Eosinophils Absolute: 0.4 10*3/uL (ref 0.0–0.5)
Eosinophils Relative: 2 %
HCT: 34.9 % — ABNORMAL LOW (ref 36.0–46.0)
Hemoglobin: 11.7 g/dL — ABNORMAL LOW (ref 12.0–15.0)
Immature Granulocytes: 2 %
Lymphocytes Relative: 10 %
Lymphs Abs: 1.7 10*3/uL (ref 0.7–4.0)
MCH: 30.3 pg (ref 26.0–34.0)
MCHC: 33.5 g/dL (ref 30.0–36.0)
MCV: 90.4 fL (ref 80.0–100.0)
Monocytes Absolute: 1.8 10*3/uL — ABNORMAL HIGH (ref 0.1–1.0)
Monocytes Relative: 10 %
Neutro Abs: 13.4 10*3/uL — ABNORMAL HIGH (ref 1.7–7.7)
Neutrophils Relative %: 76 %
Platelets: UNDETERMINED 10*3/uL (ref 150–400)
RBC: 3.86 MIL/uL — ABNORMAL LOW (ref 3.87–5.11)
RDW: 12.8 % (ref 11.5–15.5)
WBC: 17.6 10*3/uL — ABNORMAL HIGH (ref 4.0–10.5)
nRBC: 0 % (ref 0.0–0.2)

## 2019-08-19 MED ORDER — POTASSIUM CHLORIDE CRYS ER 20 MEQ PO TBCR
40.0000 meq | EXTENDED_RELEASE_TABLET | Freq: Two times a day (BID) | ORAL | Status: AC
  Administered 2019-08-19 (×2): 40 meq via ORAL
  Filled 2019-08-19 (×2): qty 2

## 2019-08-19 NOTE — Progress Notes (Signed)
Pt very anxious and heard yelling from inside her room. Pt states she is in so much pain, and that she is not getting any better. Pt requested pain medication and anxiety meds to help her relax and sleep. Pt in bed resting at this time. Call light in reach.

## 2019-08-19 NOTE — Plan of Care (Signed)
  Problem: RH SAFETY Goal: RH STG ADHERE TO SAFETY PRECAUTIONS W/ASSISTANCE/DEVICE Description: Patient will refrain from falls and ask for assistance as needed.  Outcome: Progressing   Problem: RH SAFETY Goal: RH STG DECREASED RISK OF FALL WITH ASSISTANCE Description: STG Decreased Risk of Fall With mod I Assistance. Outcome: Progressing   Problem: RH PAIN MANAGEMENT Goal: RH STG PAIN MANAGED AT OR BELOW PT'S PAIN GOAL Description: Less than 3 out of 10 Outcome: Progressing

## 2019-08-19 NOTE — Progress Notes (Signed)
Physical Therapy Session Note  Patient Details  Name: Stacy Wagner MRN: [id number] Date of Birth: Aug 05, 1940  Today's Date: 08/19/2019 PT Individual Time: 0804-0917 PT Individual Time Calculation (min): 73 min   Short Term Goals: Week 2:  PT Short Term Goal 1 (Week 2): STGs = LTGs due to ELOS  Skilled Therapeutic Interventions/Progress Updates:     Pt received seated in [facility] and agreeable to therapy. Reports soreness pain in R hip. Number not provided. PT provides rest breaks as needed to manage pain symptoms. Pt performs sit to stand throughout session with supervision for cues on hand placement and body mechanics. PT threads pt's pants over BLEs, and pt able to pull up from knees.   WC transport to therapy gym for time management. Stand step transfer to Nustep with cues on positioning for safety. Pt performs 15:00 on Nustep at workload of 1, completing 502 steps. Cues to keep steps per minute greater than 40. Activity performed for AAROM of RLE, endurance training, and reciprocal coordination training.  Pt ambulates 160' with RW and CGA from PT. Cues to increase gait speed and occasional assistance propelling RW. Pt also cues to maintain upright gaze for improved posture and balance, and taking longer stride lengths for decreased risk of falls.  Pt performs bed mobility training on mat table. Pt practices transitioning to/from R/L side propped position back to sitting position. White tape used for target and gradually moved farther laterally from patient for increased challenge. Pt performs with verbal cues for body mechanics and hand placement. Pt ultimately perform sit to R sidelying with minA for RLE management. Then performs R sidelying to sit with supervision for verbal cues on hand placement and sequencing.   Stand pivot transfer to [facility] with supervision. Pt left seated in WC with alarm intact and all needs within reach.  Therapy Documentation Precautions:  Precautions Precautions:  Back, Fall Precaution Comments: reviewed 3/3 back precautions Required Braces or Orthoses: Spinal Brace Spinal Brace: Thoracolumbosacral orthotic, Applied in sitting position Restrictions Weight Bearing Restrictions: No    Therapy/Group: Individual Therapy  Breck Coons, PT, DPT 08/19/2019, 3:39 PM

## 2019-08-19 NOTE — Progress Notes (Signed)
[place] PHYSICAL MEDICINE & REHABILITATION PROGRESS NOTE   Subjective/Complaints:  Pain is slightly better than yesterday reports yesterday was "horrific"- today is a litlte better, but still painful in RLE- notes she's pretty sleepy with meds- nodded off during breakfast.   Says sleeps 60-90 minutes, wakes up due ot pain, then gets meds/falls back asleep.   ROS:   Pt denies SOB, abd pain, CP, N/V/C/D, and vision changes   Objective:   No results found. Recent Labs    08/18/19 0840 08/19/19 0513  WBC 16.0* 17.6*  HGB 12.1 11.7*  HCT 36.9 34.9*  PLT 439* PLATELET CLUMPS NOTED ON SMEAR, UNABLE TO ESTIMATE   Recent Labs    08/17/19 0656  NA 132*  K 3.3*  CL 95*  CO2 28  GLUCOSE 134*  BUN 24*  CREATININE 0.84  CALCIUM 8.2*    Intake/Output Summary (Last 24 hours) at 08/19/2019 0950 Last data filed at 08/19/2019 0700 Gross per 24 hour  Intake 600 ml  Output 1 ml  Net 599 ml     Physical Exam: Vital Signs Blood pressure (!) 117/50, pulse 83, temperature 98 F (36.7 C), temperature source Oral, resp. rate 17, height 5\' 8"  (1.727 m), weight 72 kg, SpO2 99 %. General: sitting up in manual w/c at bedside, appropriate, NAD; not tearful today HEENT: conjugate gaze Heart: RRR Chest: CTA B/L- no W/R/R- good air movement Abdomen: Soft, NT, ND, (+)BS   Extremities: No clubbing, cyanosis, or edema. Pulses are 2+ Skin: dressing incisions  In place Neuro: Pt is cognitively appropriate with normal insight, memory, and awareness. Cranial nerves 2-12 are intact. Sensory exam is normal. Fine motor coordination is intact. No tremors. Motor function is grossly 5/5 in bilateral upper extremities. LLE grossly 4+/5. RLE with HF and KE at 2/5 and DF/DF 4/5- pain limited. RLE tender to even light touch.  MSK: wearing TLSO Psych: pt appropriate   Assessment/Plan: 1. Functional deficits secondary to impaired mobility and ADLs secondary to L5-S1 anterior lumbar interbody fusion and  L1-L5 anterolateral lumbar interbody fusion for stage 1 scoliosis, complicated by dysesthesias in RLE, which require 3+ hours per day of interdisciplinary therapy in a comprehensive inpatient rehab setting.  Physiatrist is providing close team supervision and 24 hour management of active medical problems listed below.  Physiatrist and rehab team continue to assess barriers to discharge/monitor patient progress toward functional and medical goals  Care Tool:  Bathing    Body parts bathed by patient: Face, Right upper leg, Left upper leg, Right arm, Left arm   Body parts bathed by helper: Right upper leg, Left upper leg, Buttocks     Bathing assist Assist Level: Set up assist     Upper Body Dressing/Undressing Upper body dressing   What is the patient wearing?: Orthosis Orthosis activity level: Performed by patient  Upper body assist Assist Level: Moderate Assistance - Patient 50 - 74%    Lower Body Dressing/Undressing Lower body dressing      What is the patient wearing?: Incontinence brief, Pants     Lower body assist Assist for lower body dressing: Moderate Assistance - Patient 50 - 74%     Toileting Toileting    Toileting assist Assist for toileting: Moderate Assistance - Patient 50 - 74%     Transfers Chair/bed transfer  Transfers assist     Chair/bed transfer assist level: Contact Guard/Touching assist Chair/bed transfer assistive device: Emergency planning/management officer  level: Contact Guard/Touching assist Assistive device: Walker-rolling Max distance: 230   Walk 10 feet activity   Assist     Assist level: Contact Guard/Touching assist Assistive device: Walker-rolling   Walk 50 feet activity   Assist Walk 50 feet with 2 turns activity did not occur: Safety/medical concerns  Assist level: Contact Guard/Touching assist Assistive device: Walker-rolling    Walk 150 feet activity   Assist Walk 150 feet  activity did not occur: Safety/medical concerns  Assist level: Contact Guard/Touching assist Assistive device: Walker-rolling    Walk 10 feet on uneven surface  activity   Assist     Assist level: Minimal Assistance - Patient > 75% Assistive device: Aeronautical engineer Will patient use wheelchair at discharge?: No             Wheelchair 50 feet with 2 turns activity    Assist            Wheelchair 150 feet activity     Assist          Blood pressure (!) 117/50, pulse 83, temperature 98 F (36.7 C), temperature source Oral, resp. rate 17, height 5\' 8"  (1.727 m), weight 72 kg, SpO2 99 %.  Medical Problem List and Plan: 1.  Impaired mobility and ADLs secondary to L5-S1 anterior lumbar interbody fusion and L1-L5 anterolateral lumbar interbody fusion for stage 1 scoliosis, complicated by dysesthesias in RLE.             -patient may shower but incision must be covered  6/23- Wearing TLSO             -ELOS/Goals: modI 12-16 days  -Continue CIR therapies. 2.  Antithrombotics: -DVT/anticoagulation:  Pharmaceutical: Lovenox- d/ced since patient ambulated 300 feet 6/17 3. Pain Management: Continue Oxycodone prn--d/c hydrodocone/hydromorphone. On valium and robaxin prn for muscle spasms.   6/16: Complaining of painful muscle spasms this morning. Valium was discontinued, can consider restarting, but prefer use of less sedating Methocarbamol at this time. Encourage patient to ask if she needs.   6/17: patient tearful that pain is very sever at night and prevented her from sleeping. Started amitriptyline 10mg  at night to help with insomnia and neuropathic pain.   6/18: Not in pain this morning. Hopefully amitriptyline will help with pain as well.   6/19: Says pain is still present (rated 5/10 in therapy today) but manageable.   6/20: Continues to have painful right lower extremity dysesthesias. On Tramadol and Gabapentin TID and has prn  Oxycodone and Methocarbamol which she has been using.   6/21- increased Gabapentin to 600 mg TID for nerve pain  6/23- more sleepy- but less pain today.  4. Mood: LCSW to follow for evaluation and support. Anxious at times. Klonopin 0.25mg  prn ordered.              -antipsychotic agents: N/A 5. Neuropsych: This patient is capable of making decisions on her own behalf. 6. Skin/Wound Care: Honeycomb dressing along spinal incision without drainage. Incision sites in right iliac crest with skin glue. Continue to monitor. Can remove dressing 6/20 7. Fluids/Electrolytes/Nutrition: Monitor I/O.  8. HTN: Monitor BP tid--continue Maxzide. Labile- continue to monitor.  9. Neuropathy: Gabapentin increased to 400 mg tid on 06/14. Continues on 4mg  IV decadron every 8 hrs.              6/14: Gabapentin was increased to 400mg  TID as worst pain is currently the neuropathic pain in RLE. If well  tolerated and pain persists, can consider increasing dose further.   6/21- increased gabapentin to 600 mg TID  6/23- a little sleepy- but pain better today.  10. Hypothyroid: Continue supplement.  11. Thrush: Continue Nystatin swish and swallow. Improving.  12. Leukocytosis: 2/2 steroids. 17.3 on 6/15. Continue to monitor weekly.  13. Blood loss anemia: 10.4 on 6/15. Continue to monitor weekly. 14. Hypocalcemia: Ca 8.5 on 6/15. Started on calcium supplement. Monitor weekly.   15. Constipation: Receiving dig stim HS without much benefit. Does receive benefit from soaps suds enema. Will order daily PRN to be uses with bowel program. Patient states she used to use this at home.   6/22- will add enema daily since did at home 16. Insomnia: Improved with amitriptyline. It does make her very groggy during the day but she would like to continue at this time.    6/21- slept better 17. Leukocytosis  6/21- pt's WBC is up to 21k- was 17k- will check U/A and Cx and have PA check incision if possible to make sure not cause of  infection- not on prednisone.  Is afebrile and said doesn't feel bad.   6/22- U/A is completely negative- just finished IV steroids, so likely the cause of leukocytosis- will monitor with daily CBC to make sure down trending- is pending now.  18. Hypokalemia  6/23- will replete K+- 40 mEq x 2  LOS: 9 days A FACE TO FACE EVALUATION WAS PERFORMED  Stacy Wagner 08/19/2019, 9:50 AM

## 2019-08-19 NOTE — Progress Notes (Signed)
Occupational Therapy Session Note  Patient Details  Name: Stacy Wagner MRN: [id number] Date of Birth: 04-02-1940  Today's Date: 08/19/2019 OT Individual Time: 9179-1505 and 6979-4801 OT Individual Time Calculation (min): 56 min and 59 min   Short Term Goals: Week 2:  OT Short Term Goal 1 (Week 2): Pt will be able to pull pants down with min A in standing to prep for toileting. OT Short Term Goal 2 (Week 2): Pt will don socks with sock aid with supervision. OT Short Term Goal 3 (Week 2): Pt will perform LB dressing with Min A with use of reacher OT Short Term Goal 4 (Week 2): Pt will perform UB/LB bathing with Min A with use of AE as needed  Skilled Therapeutic Interventions/Progress Updates:    Pt greeted at time of session sitting up in wheelchair, agreeable to OT session. Discussed bathing at shower level, but pt wanted to wait until tomorrow for comfort level. Pt did need to use the bathroom, ambulated with RW to bathroom CGA and doffed clothing Min A, later required Mod A to don back over hips but improving. Patient ed and training with using cleansing aide after BM, pt also able to bring her arm behind self to partially cleanse without breaking precautions and with brace on. Mod A for hygiene after BM. LB dressing for donning pants with pt ed on use of reacher to thread feet into pants, pull up to knees, and then stand to don over hips. Improved skill performance today d/t decreased pain compared to other sessions. Ambulated back to wheelchair with CGA with RW. Pt back up in chair with alarm on, call bell in reach, all needs met.   Session 2: Pt greeted up in wheelchair with family in the room, agreeable to OT session. More c/o pain in RLE this session so therapist provided sensory input to RLE with tapping and clothing texture to desensitize. Sit to stands and static standing for weight bearing through RLE prior to ambulating. With family in room, practiced donning/doffing TLSO with  family having pt, wife, husband practice in prep for DC next week. Good carryover noted with pt recall from previous sessions. Pt ambulated to apartment and performed SPT to TTB after demonstration with CGA, cues for hand and foot placement but once sitting pt needed Mod A to bring RLE over side of tub. Discussed TTB at home with pt and daughter instead of walk in shower in case the pt is having more pain and can sit to get into home shower unit and they are agreeable. Pt transferred out of tub in same manner and brought back to room via wheelchair and performed toileting in the same manner as AM session, Min-Mod assist overall but improved today compared to previous sessions and daughter present for family ed. Pt up in wheelchair with alarm on, call bell in reach, all needs met.   Therapy Documentation Precautions:  Precautions Precautions: Back, Fall Precaution Comments: reviewed 3/3 back precautions Required Braces or Orthoses: Spinal Brace Spinal Brace: Thoracolumbosacral orthotic, Applied in sitting position Restrictions Weight Bearing Restrictions: No    Therapy/Group: Individual Therapy  Viona Gilmore 08/19/2019, 4:22 PM

## 2019-08-20 ENCOUNTER — Inpatient Hospital Stay (HOSPITAL_COMMUNITY): Payer: Medicare Other | Admitting: Physical Therapy

## 2019-08-20 ENCOUNTER — Inpatient Hospital Stay (HOSPITAL_COMMUNITY): Payer: Medicare Other | Admitting: Occupational Therapy

## 2019-08-20 LAB — CBC WITH DIFFERENTIAL/PLATELET
Abs Immature Granulocytes: 0.26 10*3/uL — ABNORMAL HIGH (ref 0.00–0.07)
Basophils Absolute: 0 10*3/uL (ref 0.0–0.1)
Basophils Relative: 0 %
Eosinophils Absolute: 0.3 10*3/uL (ref 0.0–0.5)
Eosinophils Relative: 2 %
HCT: 32.2 % — ABNORMAL LOW (ref 36.0–46.0)
Hemoglobin: 10.7 g/dL — ABNORMAL LOW (ref 12.0–15.0)
Immature Granulocytes: 2 %
Lymphocytes Relative: 9 %
Lymphs Abs: 1.4 10*3/uL (ref 0.7–4.0)
MCH: 30.8 pg (ref 26.0–34.0)
MCHC: 33.2 g/dL (ref 30.0–36.0)
MCV: 92.8 fL (ref 80.0–100.0)
Monocytes Absolute: 1.6 10*3/uL — ABNORMAL HIGH (ref 0.1–1.0)
Monocytes Relative: 11 %
Neutro Abs: 11.8 10*3/uL — ABNORMAL HIGH (ref 1.7–7.7)
Neutrophils Relative %: 76 %
Platelets: 353 10*3/uL (ref 150–400)
RBC: 3.47 MIL/uL — ABNORMAL LOW (ref 3.87–5.11)
RDW: 13 % (ref 11.5–15.5)
WBC: 15.5 10*3/uL — ABNORMAL HIGH (ref 4.0–10.5)
nRBC: 0 % (ref 0.0–0.2)

## 2019-08-20 LAB — BASIC METABOLIC PANEL
Anion gap: 7 (ref 5–15)
BUN: 18 mg/dL (ref 8–23)
CO2: 31 mmol/L (ref 22–32)
Calcium: 8.2 mg/dL — ABNORMAL LOW (ref 8.9–10.3)
Chloride: 95 mmol/L — ABNORMAL LOW (ref 98–111)
Creatinine, Ser: 0.71 mg/dL (ref 0.44–1.00)
GFR calc Af Amer: >60 mL/min (ref >60)
GFR calc non Af Amer: >60 mL/min (ref >60)
Glucose, Bld: 119 mg/dL — ABNORMAL HIGH (ref 70–99)
Potassium: 4.8 mmol/L (ref 3.5–5.1)
Sodium: 133 mmol/L — ABNORMAL LOW (ref 135–145)

## 2019-08-20 MED ORDER — PREDNISONE 20 MG PO TABS
20.0000 mg | ORAL_TABLET | Freq: Every day | ORAL | Status: AC
  Administered 2019-08-24: 20 mg via ORAL
  Filled 2019-08-20: qty 1

## 2019-08-20 MED ORDER — PREDNISONE 10 MG PO TABS
10.0000 mg | ORAL_TABLET | Freq: Every day | ORAL | Status: AC
  Administered 2019-08-25: 10 mg via ORAL
  Filled 2019-08-20: qty 1

## 2019-08-20 MED ORDER — PREDNISONE 20 MG PO TABS
30.0000 mg | ORAL_TABLET | Freq: Every day | ORAL | Status: AC
  Administered 2019-08-23: 30 mg via ORAL
  Filled 2019-08-20: qty 1

## 2019-08-20 MED ORDER — PREDNISONE 20 MG PO TABS
60.0000 mg | ORAL_TABLET | Freq: Every day | ORAL | Status: AC
  Administered 2019-08-21: 60 mg via ORAL
  Filled 2019-08-20: qty 3

## 2019-08-20 MED ORDER — PREDNISONE 20 MG PO TABS
40.0000 mg | ORAL_TABLET | Freq: Every day | ORAL | Status: AC
  Administered 2019-08-22: 40 mg via ORAL
  Filled 2019-08-20: qty 2

## 2019-08-20 MED ORDER — PREDNISONE 10 MG PO TABS
5.0000 mg | ORAL_TABLET | Freq: Every day | ORAL | Status: AC
  Administered 2019-08-26: 5 mg via ORAL
  Filled 2019-08-20: qty 1

## 2019-08-20 NOTE — Progress Notes (Signed)
Occupational Therapy Session Note  Patient Details  Name: Stacy Wagner MRN: [id number] Date of Birth: 1940/05/26  Today's Date: 08/20/2019 OT Individual Time: 1302-1400 OT Individual Time Calculation (min): 58 min    Short Term Goals: Week 2:  OT Short Term Goal 1 (Week 2): Pt will be able to pull pants down with min A in standing to prep for toileting. OT Short Term Goal 2 (Week 2): Pt will don socks with sock aid with supervision. OT Short Term Goal 3 (Week 2): Pt will perform LB dressing with Min A with use of reacher OT Short Term Goal 4 (Week 2): Pt will perform UB/LB bathing with Min A with use of AE as needed  Skilled Therapeutic Interventions/Progress Updates:    Pt greeted at time of session sitting up in wheelchair agreeable to OT session to work on shower level bathing. Therapist demonstrated prior to performing ADL task for transfers and sequencing. Pt set up at shower, stand pivot to TTB with CGA with RW and doffed LB clothing in standing with min A to get over her feet. Once sitting on bench, pt brought feet over simulated tub wall and doffed UB clothing Min A including brace and therapist placed Tegaderm protective covering for incision site. UB bathing CGA, LB bathing Mod A to reach BLEs below knees and to wash buttocks later when pt standing. After drying off in the same manner, pt transitioned to edge of bench and performed UB dressing with Min A to bring shirt around shoulders, donned brace CGA-Min A and LB dressing Mod with assist to thread feet into pant legs. Cues throughout session for sequencing routine with her TLSO brace and maintaining precautions throughout. Once dressed, ambulated short distance to toilet and doffed clothing with Min A, assistance for hygiene d/t loose BM and unable to reach with precautions, Min-Mod A to don back over hips but improving. Pt back up in wheelchair with alarm on, call bell in reach, all needs met.    Therapy  Documentation Precautions:  Precautions Precautions: Back, Fall Precaution Comments: reviewed 3/3 back precautions Required Braces or Orthoses: Spinal Brace Spinal Brace: Thoracolumbosacral orthotic, Applied in sitting position Restrictions Weight Bearing Restrictions: No     Therapy/Group: Individual Therapy  Viona Gilmore 08/20/2019, 4:19 PM

## 2019-08-20 NOTE — Progress Notes (Signed)
Physical Therapy Session Note  Patient Details  Name: Stacy Wagner MRN: [id number] Date of Birth: 02-07-1941  Today's Date: 08/20/2019 PT Individual Time: 1012-1109 and 2297-9892 PT Individual Time Calculation (min): 57 min and 78 min    Short Term Goals: Week 2:  PT Short Term Goal 1 (Week 2): STGs = LTGs due to ELOS  Skilled Therapeutic Interventions/Progress Updates:    Session 1: Pt received sitting in w/c, TLSO donned, and agreeable to therapy session though reports the following. She reports her R LE always hurts when she wakes up in the morning and this morning the nurse gave her pain medication and it had improved some when the MD came in but after the MD left patient reports her R LE started hurting (especially at the knee) and would not ease off causing her not to be able to catch her breath. Despite this patient states "I will try to do anything you ask me to do." Dietary in/out to order meals and therapist assisting with this as she continues to have some confusion. Sitting in w/c donned pants max assist for threading LEs - pt unable to lift R LE on/off wheelchair leg rest due to lack of adequate hip flexor activation and pain. Sit<>stands using RW with CGA for safety - in standing, able to take hands off RW and pull pants over hips with CGA for safety.  Transported to/from gym in w/c for time management and energy conservation. Gait training ~283ft using RW with CGA for safety - pt demonstrates improving gait speed with continuous forward movement of AD without assistance and reciprocal stepping pattern (no longer pausing during R LE stance time to allow increased UE support on RW). Standing balance task focusing on decreased UE support on RW, visual scanning, and dual-cognitive task of grasping horseshoes off basketball goal on R side then tapping same color bean bag on L side prior to tossing horseshoe to external target CGA for safety during. Gait training ~172ft back to room using  RW with CGA for steadying - demonstrating slightly lower gait speed but still managing to keep continuous forward movement of AD. Pt left seated in w/c with needs in reach, TLSO on, and chair alarm on. Therapist spoke with Dr. Dagoberto Ligas to update her on what patient had stated at beginning of session regarding increased pain after MD left.   Session 2: Patient received sitting in w/c with her daughter and husband present - pt agreeable to therapy session. Pt wearing TLSO during entire session. Therapist updated pt and family about discussion with Dr. Dagoberto Ligas this AM regarding pt's pain levels.  Transported to/from gym in w/c for time management and energy conservation. Pt continues to be unable to lift R LE off w/c leg rest without assistance due to weakness and pain when performing hip flexion. Sit<>stands w/c<>RW with close supervision/CGA for safety throughout session - demonstrates reliance on B UE support on arm rests during the transition. Gait training ~157ft using RW with CGA for steadying - a few instances of R knee slightly "giving way" due to pain but pt able to use UE support on RW to self correct and maintain balance without increased assistance - pt continues to demonstrate improving gait mechanics with continuous forward movement of AD and reciprocal stepping. Patient reports she is having increased pain this afternoon compared to this morning and states her pain is usually the worst at night reporting "sometimes it is so bad I just lay there and cry because I don't know  what else to do." Patient reported that the warm water in the shower with OT helped decrease her pain so therapist sent message to Dr. Dagoberto Ligas asking if a kpad heating pad could be brought to patient's room for pain management - primary PT also notified. Attempted standing alternate B LE foot taps on 4" step with B UE support on RW but pt unable to clear R foot to place on step and reports significant pain upon attempts at doing so -  transitioned to only L LE foot taps on step with CGA for steadying. Sitting EOM performed modified neural glides due to pt reporting she is not comfortable lying supine - performed active knee flexion/extension ROM with education/cuing for patient to only extend knee to right before pain onset, able to move ~10degrees without shooting pain. Patient starts to cry stating that she is having the "shooting" pain down her R leg extending all the way to her anterior tibialis region. Therapist providing emotional support and despite this expression of pain, patient eager to continue participating in therapy session and states she does not want to go back to her room. Stand pivot to w/c using RW with CGA for steadying. Transported to day room in w/c. Stand pivot w/c>Nustep using RW with CGA for steadying. Performed reciprocal movement pattern retraining and activity tolerance on Nustep using B UEs and B LEs against level 5 resistance for 12 minutes totaling 210 steps. Pt requesting to walk to back to room. Gait training ~328ft back to room using RW with CGA for safety - pt demonstrating increased R LE pain with antalgic gait pattern, decreased R LE stance time, and slower gait speed. Therapist updated pt's family on her increased pain levels during therapy session. Pt left seated in w/c with needs in reach, chair alarm on, and family present. Therapist spoke with RN about patient's high pain levels and reports she can have additional medication.   Therapy Documentation Precautions:  Precautions Precautions: Back, Fall Precaution Comments: reviewed 3/3 back precautions Required Braces or Orthoses: Spinal Brace Spinal Brace: Thoracolumbosacral orthotic, Applied in sitting position Restrictions Weight Bearing Restrictions: No  Pain: Session 1: Patient reports she has continuous R LE pain that is more prominent in her knee this AM - RN premedicated - therapist provided seated rest breaks for pain management.    Session 2: Patient continues to experience significant R LE shooting pain that happens suddenly and takes her breath away. She begins to cry during session reporting that she is constantly in pain and "can find no relief." Additional details above.   Therapy/Group: Individual Therapy  Tawana Scale, PT, DPT 08/20/2019, 7:59 AM

## 2019-08-20 NOTE — Progress Notes (Signed)
[place] PHYSICAL MEDICINE & REHABILITATION PROGRESS NOTE   Subjective/Complaints:  At night, RLE pain worse at night- pain comes more often.   Had good amount of BM without enema last night.    ROS:   Pt denies SOB, abd pain, CP, N/V/C/D, and vision changes   Objective:   No results found. Recent Labs    08/19/19 0513 08/20/19 0453  WBC 17.6* 15.5*  HGB 11.7* 10.7*  HCT 34.9* 32.2*  PLT PLATELET CLUMPS NOTED ON SMEAR, UNABLE TO ESTIMATE 353   Recent Labs    08/20/19 0453  NA 133*  K 4.8  CL 95*  CO2 31  GLUCOSE 119*  BUN 18  CREATININE 0.71  CALCIUM 8.2*    Intake/Output Summary (Last 24 hours) at 08/20/2019 0823 Last data filed at 08/20/2019 0731 Gross per 24 hour  Intake 600 ml  Output 2 ml  Net 598 ml     Physical Exam: Vital Signs Blood pressure (!) 119/37, pulse 80, temperature (!) 97.4 F (36.3 C), resp. rate 18, height 5\' 8"  (1.727 m), weight 72 kg, SpO2 100 %. General: sitting up in bed; HOH; appropriate, NAD HEENT: conjugate gaze Heart: RRR Chest: CTA B/L- no W/R/R- good air movement Abdomen: Soft, NT, ND, (+)BS  Extremities: No clubbing, cyanosis, or edema. Pulses are 2+ Skin: dressing incisions  In place Neuro: Pt is cognitively appropriate with normal insight, memory, and awareness. Cranial nerves 2-12 are intact. Sensory exam is normal. Fine motor coordination is intact. No tremors. Motor function is grossly 5/5 in bilateral upper extremities. LLE grossly 4+/5. RLE with HF and KE at 2/5 and DF/DF 4/5- pain limited. RLE tender to even light touch.  MSK:not wearing TLSO Psych: pt appropriate   Assessment/Plan: 1. Functional deficits secondary to impaired mobility and ADLs secondary to L5-S1 anterior lumbar interbody fusion and L1-L5 anterolateral lumbar interbody fusion for stage 1 scoliosis, complicated by dysesthesias in RLE, which require 3+ hours per day of interdisciplinary therapy in a comprehensive inpatient rehab  setting.  Physiatrist is providing close team supervision and 24 hour management of active medical problems listed below.  Physiatrist and rehab team continue to assess barriers to discharge/monitor patient progress toward functional and medical goals  Care Tool:  Bathing    Body parts bathed by patient: Face, Right upper leg, Left upper leg, Right arm, Left arm   Body parts bathed by helper: Right upper leg, Left upper leg, Buttocks     Bathing assist Assist Level: Set up assist     Upper Body Dressing/Undressing Upper body dressing   What is the patient wearing?: Orthosis Orthosis activity level: Performed by patient  Upper body assist Assist Level: Minimal Assistance - Patient > 75%    Lower Body Dressing/Undressing Lower body dressing      What is the patient wearing?: Incontinence brief, Pants     Lower body assist Assist for lower body dressing: Moderate Assistance - Patient 50 - 74%     Toileting Toileting    Toileting assist Assist for toileting: Moderate Assistance - Patient 50 - 74%     Transfers Chair/bed transfer  Transfers assist     Chair/bed transfer assist level: Contact Guard/Touching assist Chair/bed transfer assistive device: Programmer, multimedia   Ambulation assist      Assist level: Contact Guard/Touching assist Assistive device: Walker-rolling Max distance: 170'   Walk 10 feet activity   Assist     Assist level: Contact Guard/Touching assist Assistive device: Walker-rolling  Walk 50 feet activity   Assist Walk 50 feet with 2 turns activity did not occur: Safety/medical concerns  Assist level: Contact Guard/Touching assist Assistive device: Walker-rolling    Walk 150 feet activity   Assist Walk 150 feet activity did not occur: Safety/medical concerns  Assist level: Contact Guard/Touching assist Assistive device: Walker-rolling    Walk 10 feet on uneven surface  activity   Assist     Assist  level: Minimal Assistance - Patient > 75% Assistive device: Aeronautical engineer Will patient use wheelchair at discharge?: No             Wheelchair 50 feet with 2 turns activity    Assist            Wheelchair 150 feet activity     Assist          Blood pressure (!) 119/37, pulse 80, temperature (!) 97.4 F (36.3 C), resp. rate 18, height 5\' 8"  (1.727 m), weight 72 kg, SpO2 100 %.  Medical Problem List and Plan: 1.  Impaired mobility and ADLs secondary to L5-S1 anterior lumbar interbody fusion and L1-L5 anterolateral lumbar interbody fusion for stage 1 scoliosis, complicated by dysesthesias in RLE.             -patient may shower but incision must be covered  6/23- Wearing TLSO             -ELOS/Goals: modI 12-16 days  -Continue CIR therapies. 2.  Antithrombotics: -DVT/anticoagulation:  Pharmaceutical: Lovenox- d/ced since patient ambulated 300 feet 6/17 3. Pain Management: Continue Oxycodone prn--d/c hydrodocone/hydromorphone. On valium and robaxin prn for muscle spasms.   6/16: Complaining of painful muscle spasms this morning. Valium was discontinued, can consider restarting, but prefer use of less sedating Methocarbamol at this time. Encourage patient to ask if she needs.   6/17: patient tearful that pain is very sever at night and prevented her from sleeping. Started amitriptyline 10mg  at night to help with insomnia and neuropathic pain.   6/18: Not in pain this morning. Hopefully amitriptyline will help with pain as well.   6/19: Says pain is still present (rated 5/10 in therapy today) but manageable.   6/20: Continues to have painful right lower extremity dysesthesias. On Tramadol and Gabapentin TID and has prn Oxycodone and Methocarbamol which she has been using.   6/21- increased Gabapentin to 600 mg TID for nerve pain  6/23- more sleepy- but less pain today.  4. Mood: LCSW to follow for evaluation and support. Anxious at times.  Klonopin 0.25mg  prn ordered.              -antipsychotic agents: N/A 5. Neuropsych: This patient is capable of making decisions on her own behalf. 6. Skin/Wound Care: Honeycomb dressing along spinal incision without drainage. Incision sites in right iliac crest with skin glue.  6/24- d/c honeycomb dressing  Continue to monitor. Can remove dressing 6/20 7. Fluids/Electrolytes/Nutrition: Monitor I/O.  8. HTN: Monitor BP tid--continue Maxzide. Labile- continue to monitor.  9. Neuropathy: Gabapentin increased to 400 mg tid on 06/14. Continues on 4mg  IV decadron every 8 hrs.              6/14: Gabapentin was increased to 400mg  TID as worst pain is currently the neuropathic pain in RLE. If well tolerated and pain persists, can consider increasing dose further.   6/21- increased gabapentin to 600 mg TID  6/23- a little sleepy- but pain better today.  10.  Hypothyroid: Continue supplement.  11. Thrush: Continue Nystatin swish and swallow. Improving.  12. Leukocytosis: 2/2 steroids. 17.3 on 6/15. Continue to monitor weekly.  13. Blood loss anemia: 10.4 on 6/15. Continue to monitor weekly. 14. Hypocalcemia: Ca 8.5 on 6/15. Started on calcium supplement. Monitor weekly.   15. Constipation: Receiving dig stim HS without much benefit. Does receive benefit from soaps suds enema. Will order daily PRN to be uses with bowel program. Patient states she used to use this at home.   6/22- will add enema daily since did at home 16. Insomnia: Improved with amitriptyline. It does make her very groggy during the day but she would like to continue at this time.    6/21- slept better 17. Leukocytosis  6/21- pt's WBC is up to 21k- was 17k- will check U/A and Cx and have PA check incision if possible to make sure not cause of infection- not on prednisone.  Is afebrile and said doesn't feel bad.   6/22- U/A is completely negative- just finished IV steroids, so likely the cause of leukocytosis- will monitor with daily CBC  to make sure down trending- is pending now.   6/24- CBC down to 15.5k 18. Hypokalemia  6/23- will replete K+- 40 mEq x 2  6/24- K+ 4.8 now s/p repletion  LOS: 10 days A FACE TO FACE EVALUATION WAS PERFORMED  Sumiye Hirth 08/20/2019, 8:23 AM

## 2019-08-20 NOTE — Plan of Care (Signed)
  Problem: RH SAFETY Goal: RH STG ADHERE TO SAFETY PRECAUTIONS W/ASSISTANCE/DEVICE Description: Patient will refrain from falls and ask for assistance as needed.  Outcome: Progressing   Problem: RH PAIN MANAGEMENT Goal: RH STG PAIN MANAGED AT OR BELOW PT'S PAIN GOAL Description: Less than 3 out of 10 Outcome: Progressing

## 2019-08-21 ENCOUNTER — Inpatient Hospital Stay (HOSPITAL_COMMUNITY): Payer: Medicare Other

## 2019-08-21 ENCOUNTER — Inpatient Hospital Stay (HOSPITAL_COMMUNITY): Payer: Medicare Other | Admitting: Occupational Therapy

## 2019-08-21 LAB — CBC WITH DIFFERENTIAL/PLATELET
Abs Immature Granulocytes: 0.17 10*3/uL — ABNORMAL HIGH (ref 0.00–0.07)
Basophils Absolute: 0 10*3/uL (ref 0.0–0.1)
Basophils Relative: 0 %
Eosinophils Absolute: 0.3 10*3/uL (ref 0.0–0.5)
Eosinophils Relative: 2 %
HCT: 31.1 % — ABNORMAL LOW (ref 36.0–46.0)
Hemoglobin: 10.3 g/dL — ABNORMAL LOW (ref 12.0–15.0)
Immature Granulocytes: 1 %
Lymphocytes Relative: 11 %
Lymphs Abs: 1.4 10*3/uL (ref 0.7–4.0)
MCH: 31.1 pg (ref 26.0–34.0)
MCHC: 33.1 g/dL (ref 30.0–36.0)
MCV: 94 fL (ref 80.0–100.0)
Monocytes Absolute: 1.3 10*3/uL — ABNORMAL HIGH (ref 0.1–1.0)
Monocytes Relative: 11 %
Neutro Abs: 9 10*3/uL — ABNORMAL HIGH (ref 1.7–7.7)
Neutrophils Relative %: 75 %
Platelets: 327 10*3/uL (ref 150–400)
RBC: 3.31 MIL/uL — ABNORMAL LOW (ref 3.87–5.11)
RDW: 13.1 % (ref 11.5–15.5)
WBC: 12.1 10*3/uL — ABNORMAL HIGH (ref 4.0–10.5)
nRBC: 0 % (ref 0.0–0.2)

## 2019-08-21 NOTE — Progress Notes (Addendum)
Subjective: Patient reports "I feel better this morning than I did yesterday, but this leg still hurts...not all the time but just anytime"  Objective: Vital signs in last 24 hours: Temp:  [97.8 F (36.6 C)-98 F (36.7 C)] 97.8 F (36.6 C) (06/25 0513) Pulse Rate:  [70-99] 82 (06/25 0513) Resp:  [16-18] 18 (06/25 0513) BP: (108-125)/(53-61) 111/55 (06/25 0513) SpO2:  [88 %-100 %] 98 % (06/25 0513)  Intake/Output from previous day: 06/24 0701 - 06/25 0700 In: 855 [P.O.:855] Out: 2 [Urine:1; Stool:1] Intake/Output this shift: No intake/output data recorded.  Alert, conversant. Reports random "stabbing" pain right buttock, anterior tight thigh, knee, and right lower leg; worst about the knee.  Onset of pain unrelated to activity or position. She reports duration is short, but states pain lasted through the night two nights ago. Lifting right knee from bed against resistance elicits knee and buttock pain.   Lab Results: Recent Labs    08/20/19 0453 08/21/19 0526  WBC 15.5* 12.1*  HGB 10.7* 10.3*  HCT 32.2* 31.1*  PLT 353 327   BMET Recent Labs    08/20/19 0453  NA 133*  K 4.8  CL 95*  CO2 31  GLUCOSE 119*  BUN 18  CREATININE 0.71  CALCIUM 8.2*    Studies/Results: No results found.  Assessment/Plan:   LOS: 11 days  Plan to continue prdnisone taper for exacerbation of dysesthetic pain. Pt aware of planned d/c on Wednesday. Hopefully pain control will allow improved participation with therapies.    Verdis Prime 08/21/2019, 8:53 AM

## 2019-08-21 NOTE — Progress Notes (Signed)
Patient ID: Stacy Wagner, female   DOB: June 06, 1940, 79 y.o.   MRN: [id number]  SW met with pt in room to provide [facility] list. Pt aware SW to follow-up with her dtr. Reports she went back to [place] for her husband's birthday.   SW left message for pt dtr [name] [phone]) to discuss HHA preference, and DME: 3in1 BSC and TTB. SW asked if these items should be ordered. SW waiting on follow-up. Pt dtr will be here for family edu on Mon/Tues.  [name], MSW, [place] Office: [phone] Cell: [phone] Fax: [phone]

## 2019-08-21 NOTE — Progress Notes (Signed)
[place] PHYSICAL MEDICINE & REHABILITATION PROGRESS NOTE   Subjective/Complaints:  Feeling RLE still hurts-  Can lift leg to walk; too painful to walk when in sitting/laying position.   Surgeon coming today.  Got pt K[facility] per request.   Had a good night- feeling better.   ROS:   Pt denies SOB, abd pain, CP, N/V/C/D, and vision changes   Objective:   No results found. Recent Labs    08/20/19 0453 08/21/19 0526  WBC 15.5* 12.1*  HGB 10.7* 10.3*  HCT 32.2* 31.1*  PLT 353 327   Recent Labs    08/20/19 0453  NA 133*  K 4.8  CL 95*  CO2 31  GLUCOSE 119*  BUN 18  CREATININE 0.71  CALCIUM 8.2*    Intake/Output Summary (Last 24 hours) at 08/21/2019 0828 Last data filed at 08/21/2019 5035 Gross per 24 hour  Intake 615 ml  Output 2 ml  Net 613 ml     Physical Exam: Vital Signs Blood pressure (!) 111/55, pulse 82, temperature 97.8 F (36.6 C), resp. rate 18, height 5\' 8"  (1.727 m), weight 72 kg, SpO2 98 %. General: sitting up; HOH- in bed; more comfortable obviously, NAD HEENT: conjugate gaze Heart: RRR Chest: CTA B/L- no W/R/R- good air movement Abdomen: Soft, NT, ND, (+)BS  Extremities: No clubbing, cyanosis, or edema. Pulses are 2+ Skin: dressing incisions  In place Neuro: Pt is cognitively appropriate with normal insight, memory, and awareness. Cranial nerves 2-12 are intact. Sensory exam is normal. Fine motor coordination is intact. No tremors. Motor function is grossly 5/5 in bilateral upper extremities. LLE grossly 4+/5. RLE with HF and KE at 2/5 and DF/PF 5-/5- couldn't lift leg off bed, but think due to pain. -  RLE tender to even light touch- still  MSK:not wearing TLSO Psych: pt more appropriate   Assessment/Plan: 1. Functional deficits secondary to impaired mobility and ADLs secondary to L5-S1 anterior lumbar interbody fusion and L1-L5 anterolateral lumbar interbody fusion for stage 1 scoliosis, complicated by dysesthesias in RLE, which require 3+  hours per day of interdisciplinary therapy in a comprehensive inpatient rehab setting.  Physiatrist is providing close team supervision and 24 hour management of active medical problems listed below.  Physiatrist and rehab team continue to assess barriers to discharge/monitor patient progress toward functional and medical goals  Care Tool:  Bathing    Body parts bathed by patient: Face, Right upper leg, Left upper leg, Right arm, Left arm, Chest, Abdomen, Front perineal area   Body parts bathed by helper: Right lower leg, Left lower leg, Buttocks     Bathing assist Assist Level: Moderate Assistance - Patient 50 - 74%     Upper Body Dressing/Undressing Upper body dressing   What is the patient wearing?: Orthosis, Pull over shirt Orthosis activity level: Performed by patient  Upper body assist Assist Level: Minimal Assistance - Patient > 75%    Lower Body Dressing/Undressing Lower body dressing      What is the patient wearing?: Incontinence brief, Pants     Lower body assist Assist for lower body dressing: Moderate Assistance - Patient 50 - 74%     Toileting Toileting    Toileting assist Assist for toileting: Moderate Assistance - Patient 50 - 74%     Transfers Chair/bed transfer  Transfers assist     Chair/bed transfer assist level: Contact Guard/Touching assist Chair/bed transfer assistive device: Armrests, Programmer, multimedia   Ambulation assist  Assist level: Contact Guard/Touching assist Assistive device: Walker-rolling Max distance: 396ft   Walk 10 feet activity   Assist     Assist level: Contact Guard/Touching assist Assistive device: Walker-rolling   Walk 50 feet activity   Assist Walk 50 feet with 2 turns activity did not occur: Safety/medical concerns  Assist level: Contact Guard/Touching assist Assistive device: Walker-rolling    Walk 150 feet activity   Assist Walk 150 feet activity did not occur:  Safety/medical concerns  Assist level: Contact Guard/Touching assist Assistive device: Walker-rolling    Walk 10 feet on uneven surface  activity   Assist     Assist level: Minimal Assistance - Patient > 75% Assistive device: Aeronautical engineer Will patient use wheelchair at discharge?: No             Wheelchair 50 feet with 2 turns activity    Assist            Wheelchair 150 feet activity     Assist          Blood pressure (!) 111/55, pulse 82, temperature 97.8 F (36.6 C), resp. rate 18, height 5\' 8"  (1.727 m), weight 72 kg, SpO2 98 %.  Medical Problem List and Plan: 1.  Impaired mobility and ADLs secondary to L5-S1 anterior lumbar interbody fusion and L1-L5 anterolateral lumbar interbody fusion for stage 1 scoliosis, complicated by dysesthesias in RLE.             -patient may shower but incision must be covered  6/23- Wearing TLSO             -ELOS/Goals: modI 12-16 days  -Continue CIR therapies. 2.  Antithrombotics: -DVT/anticoagulation:  Pharmaceutical: Lovenox- d/ced since patient ambulated 300 feet 6/17 3. Pain Management: Continue Oxycodone prn--d/c hydrodocone/hydromorphone. On valium and robaxin prn for muscle spasms.   6/16: Complaining of painful muscle spasms this morning. Valium was discontinued, can consider restarting, but prefer use of less sedating Methocarbamol at this time. Encourage patient to ask if she needs.   6/17: patient tearful that pain is very sever at night and prevented her from sleeping. Started amitriptyline 10mg  at night to help with insomnia and neuropathic pain.   6/18: Not in pain this morning. Hopefully amitriptyline will help with pain as well.   6/19: Says pain is still present (rated 5/10 in therapy today) but manageable.   6/20: Continues to have painful right lower extremity dysesthesias. On Tramadol and Gabapentin TID and has prn Oxycodone and Methocarbamol which she has been using.    6/21- increased Gabapentin to 600 mg TID for nerve pain  6/23- more sleepy- but less pain today.  6/24-  Less pain due to steroid taper- did 7 days taper- on 60 mg today-  4. Mood: LCSW to follow for evaluation and support. Anxious at times. Klonopin 0.25mg  prn ordered.              -antipsychotic agents: N/A 5. Neuropsych: This patient is capable of making decisions on her own behalf. 6. Skin/Wound Care: Honeycomb dressing along spinal incision without drainage. Incision sites in right iliac crest with skin glue.  6/24- d/c honeycomb dressing  Continue to monitor. Can remove dressing 6/20 7. Fluids/Electrolytes/Nutrition: Monitor I/O.  8. HTN: Monitor BP tid--continue Maxzide. Labile- continue to monitor.  9. Neuropathy: Gabapentin increased to 400 mg tid on 06/14. Continues on 4mg  IV decadron every 8 hrs.  6/14: Gabapentin was increased to 400mg  TID as worst pain is currently the neuropathic pain in RLE. If well tolerated and pain persists, can consider increasing dose further.   6/21- increased gabapentin to 600 mg TID  6/23- a little sleepy- but pain better today.   6/25- on steroid taper- helping some 10. Hypothyroid: Continue supplement.  11. Thrush: Continue Nystatin swish and swallow. Improving.  12. Leukocytosis: 2/2 steroids. 17.3 on 6/15. Continue to monitor weekly.  13. Blood loss anemia: 10.4 on 6/15. Continue to monitor weekly. 14. Hypocalcemia: Ca 8.5 on 6/15. Started on calcium supplement. Monitor weekly.   15. Constipation: Receiving dig stim HS without much benefit. Does receive benefit from soaps suds enema. Will order daily PRN to be uses with bowel program. Patient states she used to use this at home.   6/22- will add enema daily since did at home  6/25- didn't need last 2 days 16. Insomnia: Improved with amitriptyline. It does make her very groggy during the day but she would like to continue at this time.    6/21- slept better 17. Leukocytosis  6/21-  pt's WBC is up to 21k- was 17k- will check U/A and Cx and have PA check incision if possible to make sure not cause of infection- not on prednisone.  Is afebrile and said doesn't feel bad.   6/22- U/A is completely negative- just finished IV steroids, so likely the cause of leukocytosis- will monitor with daily CBC to make sure down trending- is pending now.   6/24- CBC down to 15.5k 18. Hypokalemia  6/23- will replete K+- 40 mEq x 2  6/24- K+ 4.8 now s/p repletion  LOS: 11 days A FACE TO FACE EVALUATION WAS PERFORMED  Beauden Tremont 08/21/2019, 8:28 AM

## 2019-08-21 NOTE — Progress Notes (Signed)
Occupational Therapy Session Note  Patient Details  Name: Stacy Wagner MRN: [id number] Date of Birth: Jun 08, 1940  Today's Date: 08/21/2019 OT Individual Time: 8676-7209 and 1301-1415 OT Individual Time Calculation (min): 57 min and 74 min   Short Term Goals: Week 2:  OT Short Term Goal 1 (Week 2): Pt will be able to pull pants down with min A in standing to prep for toileting. OT Short Term Goal 2 (Week 2): Pt will don socks with sock aid with supervision. OT Short Term Goal 3 (Week 2): Pt will perform LB dressing with Min A with use of reacher OT Short Term Goal 4 (Week 2): Pt will perform UB/LB bathing with Min A with use of AE as needed  Skilled Therapeutic Interventions/Progress Updates:    Pt greeted at time of session reclined in bed, agreeable to OT session. Bed mobility tasks with HOB lowered to standard bed height and performed supine to sit with Mod A to maintain precautions. Donned brace with Min A. Ambulated short distance to bathroom with CGA with RW and slow pace, performed toileting with CGA-Min A with pt able to doff pants but Min A to don back over hips. Practice donning new brief in prep for DC home. Reacher ed for donning pants after toileting to improve clothing management with precautions. Total assist for hygiene d/t BM and difficulty using cleansing aide. Pt brought to sink level via wheelchair and performed hygiene/grooming and oral care at sink level with supervision/set up. Sensory input provided for RLE with tapping/rubbing of clothing for desensitization. Pt up in wheelchair with alarm on, call bell in reach, all needs met.   Pt greeted at time of session sitting up in wheelchair agreeable to OT session. Mild c/o pain throughout session, exacerbated with transfers and movement in RLE and nursing notified. Pt ambulated short distance to the bathroom with CGA with RW and slow pace, cues for keeping head up to scan environment. Transfer to toilet with CGA and able to  doff clothing with CGA today which is an improvement, performed with extended time. LB dressing with Min A today with use of reacher, assistance only for helping thread feet with reacher d/t pant stretchy material, able to static stand and don pants and brief over hips. Pt practice with donning brief with supervision in preparation for pt to don herself at home. Extended time for all tasks but pt able to improve skill performance with reacher/AE. Pt brought to apartment for TTB transfer, performed after demonstration from therapist and pt performed with CGA for basic transfer but Mod A to manage BLEs over edge of tub in prep for DC home. Cues for pacing and pain management throughout. Pt participated in dynamic standing task with [employer] with crossing midline standing for 2-4 minute intervals to improve visual scanning and standing balance with one or no UE support for carryover to LB ADLs. Pt brought back to her room via wheelchair with alarm on, call bell in reach, all needs met.   Therapy Documentation Precautions:  Precautions Precautions: Back, Fall Precaution Comments: reviewed 3/3 back precautions Required Braces or Orthoses: Spinal Brace Spinal Brace: Thoracolumbosacral orthotic, Applied in sitting position Restrictions Weight Bearing Restrictions: No    Therapy/Group: Individual Therapy  Viona Gilmore 08/21/2019, 12:59 PM

## 2019-08-21 NOTE — Plan of Care (Signed)
°  Problem: Consults Goal: RH GENERAL PATIENT EDUCATION Description: Patient will be aware of their progress and know their expected goals and expected discharge date.    Outcome: Progressing Goal: Skin Care Protocol Initiated - if Braden Score 18 or less Description: If consults are not indicated, leave blank or document N/A Outcome: Progressing Goal: Nutrition Consult-if indicated Outcome: Progressing   Problem: RH BOWEL ELIMINATION Goal: RH STG MANAGE BOWEL WITH ASSISTANCE Description: STG Manage Bowel with mod Assistance. Outcome: Progressing Goal: RH STG MANAGE BOWEL W/MEDICATION W/ASSISTANCE Description: STG Manage Bowel with Medication with mod Assistance. Outcome: Progressing   Problem: RH BLADDER ELIMINATION Goal: RH STG MANAGE BLADDER WITH ASSISTANCE Description: Patient will be continent of bladder prior to discharge and able to empty bladder when urinating.  Outcome: Progressing Goal: RH STG MANAGE BLADDER WITH MEDICATION WITH ASSISTANCE Description: STG Manage Bladder With Medication With min Assistance. Outcome: Progressing   Problem: RH SKIN INTEGRITY Goal: RH STG SKIN FREE OF INFECTION/BREAKDOWN Description: Patient will remain free of skin tears and no new signs or symptoms of infection will appear.  Outcome: Progressing Goal: RH STG MAINTAIN SKIN INTEGRITY WITH ASSISTANCE Description: STG Maintain Skin Integrity With min Assistance. Outcome: Progressing Goal: RH STG ABLE TO PERFORM INCISION/WOUND CARE W/ASSISTANCE Description: STG Able To Perform Incision/Wound Care With min Assistance. Outcome: Progressing   Problem: RH SAFETY Goal: RH STG ADHERE TO SAFETY PRECAUTIONS W/ASSISTANCE/DEVICE Description: Patient will refrain from falls and ask for assistance as needed.  Outcome: Progressing Goal: RH STG DECREASED RISK OF FALL WITH ASSISTANCE Description: STG Decreased Risk of Fall With mod I Assistance. Outcome: Progressing   Problem: RH PAIN  MANAGEMENT Goal: RH STG PAIN MANAGED AT OR BELOW PT'S PAIN GOAL Description: Less than 3 out of 10 Outcome: Progressing   Problem: RH KNOWLEDGE DEFICIT GENERAL Goal: RH STG INCREASE KNOWLEDGE OF SELF CARE AFTER HOSPITALIZATION Description: Patient will know how to follow up with appointments and aware of how to take care of herself and prevent any unwanted hospital readmissions.  Outcome: Progressing

## 2019-08-21 NOTE — Progress Notes (Signed)
Physical Therapy Session Note  Patient Details  Name: Stacy Wagner MRN: [id number] Date of Birth: 31-Jul-1940  Today's Date: 08/21/2019 PT Individual Time: [phone] PT Individual Time Calculation (min): 57 min   Short Term Goals: Week 2:  PT Short Term Goal 1 (Week 2): STGs = LTGs due to ELOS  Skilled Therapeutic Interventions/Progress Updates:     Pt received seated in [facility] and agreeable to therapy. Reports pain in low back and RLE but reports pain is not as bad as previous day. PT provides breathing exercises and rest breaks as needed to manage pain symptoms. Sit to stand throughout session with CGA and verbal cues for hand placement and sequencing. Pt ambulates 100' x2 with close supervision and cues to maintain upright gaze for improved posture and balance, and increasing gait speed to decrease risk for falls.  Pt performs mobility training in virtual apartment, including sit to stand from recliner, requiring light minA to stand, and bed mobility using log rolling technique, requiring minA for sit to supine to manage RLE, and supervision for supine to sit.  Pt performs bed mobility training on mat table for firmer surface to allow increased comfort on pt's back. Pt performs bilateral logrolling with verbal cues for technique. Pt then performs supine therex, including 3x10 supine bridges and clamshells. Final 2 sets of bridging with orange theraband around thighs and rubber ball placed between knees to increase core engagement. Exercises performed to increase hip strength and NM core activation to help with back stability.   Supine to sit with supervision and cues on sequencing. Sit to stand with CGA and pt ambulates 100' back to room with RW and CGA. Cues to maintain upright gaze and increase gait speed. Pt left seated in WC with alarm intact and all needs within reach.  Therapy Documentation Precautions:  Precautions Precautions: Back, Fall Precaution Comments: reviewed 3/3 back  precautions Required Braces or Orthoses: Spinal Brace Spinal Brace: Thoracolumbosacral orthotic, Applied in sitting position Restrictions Weight Bearing Restrictions: No    Therapy/Group: Individual Therapy  Breck Coons, PT, DPT 08/21/2019, 5:45 PM

## 2019-08-22 ENCOUNTER — Inpatient Hospital Stay (HOSPITAL_COMMUNITY): Payer: Medicare Other

## 2019-08-22 LAB — CBC WITH DIFFERENTIAL/PLATELET
Abs Immature Granulocytes: 0.11 10*3/uL — ABNORMAL HIGH (ref 0.00–0.07)
Basophils Absolute: 0 10*3/uL (ref 0.0–0.1)
Basophils Relative: 0 %
Eosinophils Absolute: 0.1 10*3/uL (ref 0.0–0.5)
Eosinophils Relative: 0 %
HCT: 28.8 % — ABNORMAL LOW (ref 36.0–46.0)
Hemoglobin: 9.5 g/dL — ABNORMAL LOW (ref 12.0–15.0)
Immature Granulocytes: 1 %
Lymphocytes Relative: 6 %
Lymphs Abs: 0.9 10*3/uL (ref 0.7–4.0)
MCH: 30.5 pg (ref 26.0–34.0)
MCHC: 33 g/dL (ref 30.0–36.0)
MCV: 92.6 fL (ref 80.0–100.0)
Monocytes Absolute: 1.4 10*3/uL — ABNORMAL HIGH (ref 0.1–1.0)
Monocytes Relative: 10 %
Neutro Abs: 11.4 10*3/uL — ABNORMAL HIGH (ref 1.7–7.7)
Neutrophils Relative %: 83 %
Platelets: 318 10*3/uL (ref 150–400)
RBC: 3.11 MIL/uL — ABNORMAL LOW (ref 3.87–5.11)
RDW: 13 % (ref 11.5–15.5)
WBC: 13.8 10*3/uL — ABNORMAL HIGH (ref 4.0–10.5)
nRBC: 0 % (ref 0.0–0.2)

## 2019-08-22 NOTE — Progress Notes (Signed)
Occupational Therapy Session Note  Patient Details  Name: Stacy Wagner MRN: [id number] Date of Birth: 01-Jan-1941  Today's Date: 08/22/2019 OT Individual Time: 4818-5631 OT Individual Time Calculation (min): 60 min    Short Term Goals: Week 1:  OT Short Term Goal 1 (Week 1): Pt will be able to pull pants down with min A in standing to prep for toileting. OT Short Term Goal 1 - Progress (Week 1): Progressing toward goal OT Short Term Goal 2 (Week 1): Using a toilet aid, pt will be able to self cleanse with min A. OT Short Term Goal 2 - Progress (Week 1): Not met OT Short Term Goal 3 (Week 1): pt will don socks with sock aid with supervision. OT Short Term Goal 3 - Progress (Week 1): Not met OT Short Term Goal 4 (Week 1): Pt will don pants over feet with reacher with min A. OT Short Term Goal 4 - Progress (Week 1): Progressing toward goal  Skilled Therapeutic Interventions/Progress Updates:    OT session focused on ADL retraining, activity tolerance, and functional transfers. Pt received supine in bed completing supine>sit with min A and HOB elevated. Pt donned shirt with setup assist then OT assisted with donning TLSO. Completed LB dressing with min A using reacher and sock-aid. LB dressing limited by decreased strength in RLE. Completed stand pivot transfer to w/c with CGA. Engaged in functional activities while in standing 3x for 2-4 minutes each to promote activity tolerance. At end of session, pt returned to room and left with all needs in reach.   Therapy Documentation Precautions:  Precautions Precautions: Back, Fall Precaution Comments: reviewed 3/3 back precautions Required Braces or Orthoses: Spinal Brace Spinal Brace: Thoracolumbosacral orthotic, Applied in sitting position Restrictions Weight Bearing Restrictions: No General:   Vital Signs:  Pain: Pain Assessment Pain Scale: 0-10 Pain Score: 3  Pain Type: Surgical pain Pain Location: Back Pain Orientation:  Medial Pain Descriptors / Indicators: Aching Pain Onset: With Activity Patients Stated Pain Goal: 1 Pain Intervention(s): Medication (See eMAR) ADL: ADL Eating: Independent Grooming: Independent Where Assessed-Grooming: Sitting at sink Upper Body Bathing: Setup Where Assessed-Upper Body Bathing: Chair [facility]) Lower Body Bathing: Moderate assistance Where Assessed-Lower Body Bathing: Chair Upper Body Dressing: Setup (A with orthosis) Where Assessed-Upper Body Dressing: Chair Lower Body Dressing: Maximal assistance Where Assessed-Lower Body Dressing: Chair Toileting: Maximal assistance Where Assessed-Toileting: Bedside Commode Toilet Transfer: Minimal assistance Toilet Transfer Method: Stand pivot Toilet Transfer Equipment: Bedside commode Vision   Perception    Praxis   Exercises:   Other Treatments:     Therapy/Group: Individual Therapy  Duayne Cal 08/22/2019, 12:13 PM

## 2019-08-22 NOTE — Plan of Care (Signed)
°  Problem: Consults Goal: RH GENERAL PATIENT EDUCATION Description: Patient will be aware of their progress and know their expected goals and expected discharge date.    Outcome: Progressing Goal: Skin Care Protocol Initiated - if Braden Score 18 or less Description: If consults are not indicated, leave blank or document N/A Outcome: Progressing Goal: Nutrition Consult-if indicated Outcome: Progressing   Problem: RH BOWEL ELIMINATION Goal: RH STG MANAGE BOWEL WITH ASSISTANCE Description: STG Manage Bowel with mod Assistance. Outcome: Progressing Goal: RH STG MANAGE BOWEL W/MEDICATION W/ASSISTANCE Description: STG Manage Bowel with Medication with mod Assistance. Outcome: Progressing   Problem: RH BLADDER ELIMINATION Goal: RH STG MANAGE BLADDER WITH ASSISTANCE Description: Patient will be continent of bladder prior to discharge and able to empty bladder when urinating.  Outcome: Progressing Goal: RH STG MANAGE BLADDER WITH MEDICATION WITH ASSISTANCE Description: STG Manage Bladder With Medication With min Assistance. Outcome: Progressing   Problem: RH SKIN INTEGRITY Goal: RH STG SKIN FREE OF INFECTION/BREAKDOWN Description: Patient will remain free of skin tears and no new signs or symptoms of infection will appear.  Outcome: Progressing Goal: RH STG MAINTAIN SKIN INTEGRITY WITH ASSISTANCE Description: STG Maintain Skin Integrity With min Assistance. Outcome: Progressing Goal: RH STG ABLE TO PERFORM INCISION/WOUND CARE W/ASSISTANCE Description: STG Able To Perform Incision/Wound Care With min Assistance. Outcome: Progressing   Problem: RH SAFETY Goal: RH STG ADHERE TO SAFETY PRECAUTIONS W/ASSISTANCE/DEVICE Description: Patient will refrain from falls and ask for assistance as needed.  Outcome: Progressing Goal: RH STG DECREASED RISK OF FALL WITH ASSISTANCE Description: STG Decreased Risk of Fall With mod I Assistance. Outcome: Progressing   Problem: RH PAIN  MANAGEMENT Goal: RH STG PAIN MANAGED AT OR BELOW PT'S PAIN GOAL Description: Less than 3 out of 10 Outcome: Progressing   Problem: RH KNOWLEDGE DEFICIT GENERAL Goal: RH STG INCREASE KNOWLEDGE OF SELF CARE AFTER HOSPITALIZATION Description: Patient will know how to follow up with appointments and aware of how to take care of herself and prevent any unwanted hospital readmissions.  Outcome: Progressing

## 2019-08-22 NOTE — Progress Notes (Signed)
[place] PHYSICAL MEDICINE & REHABILITATION PROGRESS NOTE   Subjective/Complaints:  Pt admits that really didn't "expect" to have a lot of pain, esp nerve pain after surgery- I explained it' very common, so it sounds like pt keeps expecting pain to be "gone". Admits is continuous at night, but intermittent during day in RLE- still stabbing- does admit is BETTER than when got here.   Dr Vertell Limber note from yesterday- hopeful that steroids will help get somewhat controlled- pt also thought steroids was a frequent dose during day- explained it's 1x/day-      ROS:   Pt denies SOB, abd pain, CP, N/V/C/D, and vision changes   Objective:   No results found. Recent Labs    08/21/19 0526 08/22/19 0550  WBC 12.1* 13.8*  HGB 10.3* 9.5*  HCT 31.1* 28.8*  PLT 327 318   Recent Labs    08/20/19 0453  NA 133*  K 4.8  CL 95*  CO2 31  GLUCOSE 119*  BUN 18  CREATININE 0.71  CALCIUM 8.2*    Intake/Output Summary (Last 24 hours) at 08/22/2019 1056 Last data filed at 08/22/2019 0730 Gross per 24 hour  Intake 840 ml  Output 2 ml  Net 838 ml     Physical Exam: Vital Signs Blood pressure (!) 124/50, pulse 81, temperature 98.3 F (36.8 C), resp. rate 18, height 5\' 8"  (1.727 m), weight 72 kg, SpO2 92 %. General: sitting up- just woke up- dry mouth, harder to talk due to dry mouth, NAD HEENT: conjugate gaze Heart: RRR Chest: CTA B/L- no W/R/R- good air movement Abdomen: Soft, NT, ND, (+)BS   Extremities: No clubbing, cyanosis, or edema. Pulses are 2+ Skin: dressing incisions  In place Neuro: Pt is cognitively appropriate with normal insight, memory, and awareness. Cranial nerves 2-12 are intact. Sensory exam is normal. Fine motor coordination is intact. No tremors. Motor function is grossly 5/5 in bilateral upper extremities. LLE grossly 4+/5. RLE with HF and KE at 2/5 and DF/PF 5-/5 Still not able to lift leg off bed- but says can when walking MSK:not wearing TLSO Psych: pt  appropriate   Assessment/Plan: 1. Functional deficits secondary to impaired mobility and ADLs secondary to L5-S1 anterior lumbar interbody fusion and L1-L5 anterolateral lumbar interbody fusion for stage 1 scoliosis, complicated by dysesthesias in RLE, which require 3+ hours per day of interdisciplinary therapy in a comprehensive inpatient rehab setting.  Physiatrist is providing close team supervision and 24 hour management of active medical problems listed below.  Physiatrist and rehab team continue to assess barriers to discharge/monitor patient progress toward functional and medical goals  Care Tool:  Bathing    Body parts bathed by patient: Face, Right upper leg, Left upper leg, Right arm, Left arm, Chest, Abdomen, Front perineal area   Body parts bathed by helper: Right lower leg, Left lower leg, Buttocks     Bathing assist Assist Level: Moderate Assistance - Patient 50 - 74%     Upper Body Dressing/Undressing Upper body dressing   What is the patient wearing?: Orthosis Orthosis activity level: Performed by patient  Upper body assist Assist Level: Minimal Assistance - Patient > 75%    Lower Body Dressing/Undressing Lower body dressing      What is the patient wearing?: Incontinence brief, Pants     Lower body assist Assist for lower body dressing: Minimal Assistance - Patient > 75% (with reacher and extended time)     Toileting Toileting    Toileting assist Assist for  toileting: Moderate Assistance - Patient 50 - 74%     Transfers Chair/bed transfer  Transfers assist     Chair/bed transfer assist level: Contact Guard/Touching assist Chair/bed transfer assistive device: Armrests, Programmer, multimedia   Ambulation assist      Assist level: Contact Guard/Touching assist Assistive device: Walker-rolling Max distance: 100'   Walk 10 feet activity   Assist     Assist level: Contact Guard/Touching assist Assistive device: Walker-rolling    Walk 50 feet activity   Assist Walk 50 feet with 2 turns activity did not occur: Safety/medical concerns  Assist level: Contact Guard/Touching assist Assistive device: Walker-rolling    Walk 150 feet activity   Assist Walk 150 feet activity did not occur: Safety/medical concerns  Assist level: Contact Guard/Touching assist Assistive device: Walker-rolling    Walk 10 feet on uneven surface  activity   Assist     Assist level: Minimal Assistance - Patient > 75% Assistive device: Aeronautical engineer Will patient use wheelchair at discharge?: No             Wheelchair 50 feet with 2 turns activity    Assist            Wheelchair 150 feet activity     Assist          Blood pressure (!) 124/50, pulse 81, temperature 98.3 F (36.8 C), resp. rate 18, height 5\' 8"  (1.727 m), weight 72 kg, SpO2 92 %.  Medical Problem List and Plan: 1.  Impaired mobility and ADLs secondary to L5-S1 anterior lumbar interbody fusion and L1-L5 anterolateral lumbar interbody fusion for stage 1 scoliosis, complicated by dysesthesias in RLE.             -patient may shower but incision must be covered  6/23- Wearing TLSO             -ELOS/Goals: modI 12-16 days  -Continue CIR therapies. 2.  Antithrombotics: -DVT/anticoagulation:  Pharmaceutical: Lovenox- d/ced since patient ambulated 300 feet 6/17 3. Pain Management: Continue Oxycodone prn--d/c hydrodocone/hydromorphone. On valium and robaxin prn for muscle spasms.   6/16: Complaining of painful muscle spasms this morning. Valium was discontinued, can consider restarting, but prefer use of less sedating Methocarbamol at this time. Encourage patient to ask if she needs.   6/17: patient tearful that pain is very sever at night and prevented her from sleeping. Started amitriptyline 10mg  at night to help with insomnia and neuropathic pain.   6/18: Not in pain this morning. Hopefully amitriptyline will  help with pain as well.   6/19: Says pain is still present (rated 5/10 in therapy today) but manageable.   6/20: Continues to have painful right lower extremity dysesthesias. On Tramadol and Gabapentin TID and has prn Oxycodone and Methocarbamol which she has been using.   6/21- increased Gabapentin to 600 mg TID for nerve pain  6/23- more sleepy- but less pain today.  6/24-  Less pain due to steroid taper- did 7 days taper- on 60 mg today-  6/26- educated on steroids and pain being expected after surgery- she keeps complaining about it, because she says it's bad and she's scared "something wrong"- explained we don't think so. Dr Vertell Limber agrees  4. Mood: LCSW to follow for evaluation and support. Anxious at times. Klonopin 0.25mg  prn ordered.              -antipsychotic agents: N/A 5. Neuropsych: This patient is capable of making  decisions on her own behalf. 6. Skin/Wound Care: Honeycomb dressing along spinal incision without drainage. Incision sites in right iliac crest with skin glue.  6/24- d/c honeycomb dressing  Continue to monitor. Can remove dressing 6/20 7. Fluids/Electrolytes/Nutrition: Monitor I/O.  8. HTN: Monitor BP tid--continue Maxzide. Labile- continue to monitor.  9. Neuropathy: Gabapentin increased to 400 mg tid on 06/14. Continues on 4mg  IV decadron every 8 hrs.              6/14: Gabapentin was increased to 400mg  TID as worst pain is currently the neuropathic pain in RLE. If well tolerated and pain persists, can consider increasing dose further.   6/21- increased gabapentin to 600 mg TID  6/23- a little sleepy- but pain better today.   6/25- on steroid taper- helping some 10. Hypothyroid: Continue supplement.  11. Thrush: Continue Nystatin swish and swallow. Improving.  12. Leukocytosis: 2/2 steroids. 17.3 on 6/15. Continue to monitor weekly.  13. Blood loss anemia: 10.4 on 6/15. Continue to monitor weekly. 14. Hypocalcemia: Ca 8.5 on 6/15. Started on calcium supplement.  Monitor weekly.   15. Constipation: Receiving dig stim HS without much benefit. Does receive benefit from soaps suds enema. Will order daily PRN to be uses with bowel program. Patient states she used to use this at home.   6/22- will add enema daily since did at home  6/25- didn't need last 2 days 16. Insomnia: Improved with amitriptyline. It does make her very groggy during the day but she would like to continue at this time.   6/26- sleeping better 17. Leukocytosis  6/21- pt's WBC is up to 21k- was 17k- will check U/A and Cx and have PA check incision if possible to make sure not cause of infection- not on prednisone.  Is afebrile and said doesn't feel bad.   6/22- U/A is completely negative- just finished IV steroids, so likely the cause of leukocytosis- will monitor with daily CBC to make sure down trending- is pending now.   6/24- CBC down to 15.5k  6/26- WBC 13.8- due to steroid taper- will recheck Monday 18. Hypokalemia  6/23- will replete K+- 40 mEq x 2  6/24- K+ 4.8 now s/p repletion  LOS: 12 days A FACE TO FACE EVALUATION WAS PERFORMED  Kano Heckmann 08/22/2019, 10:56 AM

## 2019-08-23 NOTE — Progress Notes (Signed)
[facility] PHYSICAL MEDICINE & REHABILITATION PROGRESS NOTE   Subjective/Complaints:  Pain is doing better today- R hip and RLE is doing a little better today- says scared to tell me it's OK, since did that the other day and hurt worse the rest of day!  Slept well- sounds like steroids are kicking in.      ROS:   Pt denies SOB, abd pain, CP, N/V/C/D, and vision changes   Objective:   No results found. Recent Labs    08/21/19 0526 08/22/19 0550  WBC 12.1* 13.8*  HGB 10.3* 9.5*  HCT 31.1* 28.8*  PLT 327 318   No results for input(s): NA, K, CL, CO2, GLUCOSE, BUN, CREATININE, CALCIUM in the last 72 hours.  Intake/Output Summary (Last 24 hours) at 08/23/2019 1057 Last data filed at 08/23/2019 0716 Gross per 24 hour  Intake 940 ml  Output --  Net 940 ml     Physical Exam: Vital Signs Blood pressure (!) 133/56, pulse 83, temperature 97.9 F (36.6 C), temperature source Oral, resp. rate 18, height 5\' 8"  (1.727 m), weight 72 kg, SpO2 98 %. General: sitting up in manual w/c at sink, doing grooming, NAD HEENT: conjugate gaze Heart: RRR Chest: CTA B/L- no W/R/R- good air movement Abdomen: Soft, NT, ND, (+)BS  Extremities: No clubbing, cyanosis, or edema. Pulses are 2+ Skin: dressing incisions  In place- no change Neuro: Pt is cognitively appropriate with normal insight, memory, and awareness. Cranial nerves 2-12 are intact. Sensory exam is normal. Fine motor coordination is intact. No tremors. Motor function is grossly 5/5 in bilateral upper extremities. LLE grossly 4+/5. RLE with HF and KE at 2/5 and DF/PF 5-/5 Still not able to lift leg off bed- but says can when walking Psych: pt appropriate   Assessment/Plan: 1. Functional deficits secondary to impaired mobility and ADLs secondary to L5-S1 anterior lumbar interbody fusion and L1-L5 anterolateral lumbar interbody fusion for stage 1 scoliosis, complicated by dysesthesias in RLE, which require 3+ hours per day of  interdisciplinary therapy in a comprehensive inpatient rehab setting.  Physiatrist is providing close team supervision and 24 hour management of active medical problems listed below.  Physiatrist and rehab team continue to assess barriers to discharge/monitor patient progress toward functional and medical goals  Care Tool:  Bathing    Body parts bathed by patient: Face, Right upper leg, Left upper leg, Right arm, Left arm, Chest, Abdomen, Front perineal area   Body parts bathed by helper: Right lower leg, Left lower leg, Buttocks     Bathing assist Assist Level: Moderate Assistance - Patient 50 - 74%     Upper Body Dressing/Undressing Upper body dressing   What is the patient wearing?: Button up shirt Orthosis activity level: Performed by patient  Upper body assist Assist Level: Set up assist    Lower Body Dressing/Undressing Lower body dressing      What is the patient wearing?: Pants     Lower body assist Assist for lower body dressing: Minimal Assistance - Patient > 75%     Toileting Toileting    Toileting assist Assist for toileting: Moderate Assistance - Patient 50 - 74%     Transfers Chair/bed transfer  Transfers assist     Chair/bed transfer assist level: Contact Guard/Touching assist Chair/bed transfer assistive device: Programmer, multimedia   Ambulation assist      Assist level: Contact Guard/Touching assist Assistive device: Walker-rolling Max distance: 100'   Walk 10 feet activity   Assist  Assist level: Contact Guard/Touching assist Assistive device: Walker-rolling   Walk 50 feet activity   Assist Walk 50 feet with 2 turns activity did not occur: Safety/medical concerns  Assist level: Contact Guard/Touching assist Assistive device: Walker-rolling    Walk 150 feet activity   Assist Walk 150 feet activity did not occur: Safety/medical concerns  Assist level: Contact Guard/Touching assist Assistive device:  Walker-rolling    Walk 10 feet on uneven surface  activity   Assist     Assist level: Minimal Assistance - Patient > 75% Assistive device: Aeronautical engineer Will patient use wheelchair at discharge?: No             Wheelchair 50 feet with 2 turns activity    Assist            Wheelchair 150 feet activity     Assist          Blood pressure (!) 133/56, pulse 83, temperature 97.9 F (36.6 C), temperature source Oral, resp. rate 18, height 5\' 8"  (1.727 m), weight 72 kg, SpO2 98 %.  Medical Problem List and Plan: 1.  Impaired mobility and ADLs secondary to L5-S1 anterior lumbar interbody fusion and L1-L5 anterolateral lumbar interbody fusion for stage 1 scoliosis, complicated by dysesthesias in RLE.             -patient may shower but incision must be covered  6/23- Wearing TLSO             -ELOS/Goals: modI 12-16 days  -Continue CIR therapies. 2.  Antithrombotics: -DVT/anticoagulation:  Pharmaceutical: Lovenox- d/ced since patient ambulated 300 feet 6/17 3. Pain Management: Continue Oxycodone prn--d/c hydrodocone/hydromorphone. On valium and robaxin prn for muscle spasms.   6/20: Continues to have painful right lower extremity dysesthesias. On Tramadol and Gabapentin TID and has prn Oxycodone and Methocarbamol which she has been using.   6/21- increased Gabapentin to 600 mg TID for nerve pain  6/24-  Less pain due to steroid taper- did 7 days taper- on 60 mg today-  6/26- educated on steroids and pain being expected after surgery- she keeps complaining about it, because she says it's bad and she's scared "something wrong"- explained we don't think so. Dr Vertell Limber agrees   6/27- pt reports steroids are helpful- pain better- is on rapid taper 4. Mood: LCSW to follow for evaluation and support. Anxious at times. Klonopin 0.25mg  prn ordered.              -antipsychotic agents: N/A 5. Neuropsych: This patient is capable of making decisions  on her own behalf. 6. Skin/Wound Care: Honeycomb dressing along spinal incision without drainage. Incision sites in right iliac crest with skin glue.  6/24- d/c honeycomb dressing  Continue to monitor. Can remove dressing 6/20 7. Fluids/Electrolytes/Nutrition: Monitor I/O.  8. HTN: Monitor BP tid--continue Maxzide. Labile- continue to monitor.  9. Neuropathy: Gabapentin increased to 400 mg tid on 06/14. Continues on 4mg  IV decadron every 8 hrs.              6/14: Gabapentin was increased to 400mg  TID as worst pain is currently the neuropathic pain in RLE. If well tolerated and pain persists, can consider increasing dose further.   6/21- increased gabapentin to 600 mg TID  6/23- a little sleepy- but pain better today.   6/25- on steroid taper- helping some 10. Hypothyroid: Continue supplement.  11. Thrush: Continue Nystatin swish and swallow. Improving.  12. Leukocytosis: 2/2 steroids. 17.3  on 6/15. Continue to monitor weekly.  13. Blood loss anemia: 10.4 on 6/15. Continue to monitor weekly. 14. Hypocalcemia: Ca 8.5 on 6/15. Started on calcium supplement. Monitor weekly.   15. Constipation: Receiving dig stim HS without much benefit. Does receive benefit from soaps suds enema. Will order daily PRN to be uses with bowel program. Patient states she used to use this at home.   6/22- will add enema daily since did at home  6/25- didn't need last 2 days  6/27- LBM overnight- doing well 16. Insomnia: Improved with amitriptyline. It does make her very groggy during the day but she would like to continue at this time.   6/27- sleeping great 17. Leukocytosis  6/21- pt's WBC is up to 21k- was 17k- will check U/A and Cx and have PA check incision if possible to make sure not cause of infection- not on prednisone.  Is afebrile and said doesn't feel bad.   6/22- U/A is completely negative- just finished IV steroids, so likely the cause of leukocytosis- will monitor with daily CBC to make sure down  trending- is pending now.   6/24- CBC down to 15.5k  6/26- WBC 13.8- due to steroid taper- will recheck Monday 18. Hypokalemia  6/23- will replete K+- 40 mEq x 2  6/24- K+ 4.8 now s/p repletion  LOS: 13 days A FACE TO FACE EVALUATION WAS PERFORMED  Manahil Vanzile 08/23/2019, 10:57 AM

## 2019-08-23 NOTE — Plan of Care (Signed)
  Problem: Consults Goal: RH GENERAL PATIENT EDUCATION Description: Patient will be aware of their progress and know their expected goals and expected discharge date.    Outcome: Progressing Goal: Skin Care Protocol Initiated - if Braden Score 18 or less Description: If consults are not indicated, leave blank or document N/A Outcome: Progressing Goal: Nutrition Consult-if indicated Outcome: Progressing   Problem: RH BOWEL ELIMINATION Goal: RH STG MANAGE BOWEL WITH ASSISTANCE Description: STG Manage Bowel with mod Assistance. Outcome: Progressing Goal: RH STG MANAGE BOWEL W/MEDICATION W/ASSISTANCE Description: STG Manage Bowel with Medication with mod Assistance. Outcome: Progressing   Problem: RH BLADDER ELIMINATION Goal: RH STG MANAGE BLADDER WITH ASSISTANCE Description: Patient will be continent of bladder prior to discharge and able to empty bladder when urinating.  Outcome: Progressing Goal: RH STG MANAGE BLADDER WITH MEDICATION WITH ASSISTANCE Description: STG Manage Bladder With Medication With min Assistance. Outcome: Progressing   Problem: RH SKIN INTEGRITY Goal: RH STG SKIN FREE OF INFECTION/BREAKDOWN Description: Patient will remain free of skin tears and no new signs or symptoms of infection will appear.  Outcome: Progressing Goal: RH STG MAINTAIN SKIN INTEGRITY WITH ASSISTANCE Description: STG Maintain Skin Integrity With min Assistance. Outcome: Progressing Goal: RH STG ABLE TO PERFORM INCISION/WOUND CARE W/ASSISTANCE Description: STG Able To Perform Incision/Wound Care With min Assistance. Outcome: Progressing   Problem: RH SAFETY Goal: RH STG ADHERE TO SAFETY PRECAUTIONS W/ASSISTANCE/DEVICE Description: Patient will refrain from falls and ask for assistance as needed.  Outcome: Progressing Goal: RH STG DECREASED RISK OF FALL WITH ASSISTANCE Description: STG Decreased Risk of Fall With mod I Assistance. Outcome: Progressing   Problem: RH PAIN  MANAGEMENT Goal: RH STG PAIN MANAGED AT OR BELOW PT'S PAIN GOAL Description: Less than 3 out of 10 Outcome: Progressing   Problem: RH KNOWLEDGE DEFICIT GENERAL Goal: RH STG INCREASE KNOWLEDGE OF SELF CARE AFTER HOSPITALIZATION Description: Patient will know how to follow up with appointments and aware of how to take care of herself and prevent any unwanted hospital readmissions.  Outcome: Progressing

## 2019-08-24 ENCOUNTER — Ambulatory Visit (HOSPITAL_COMMUNITY): Payer: Medicare Other

## 2019-08-24 ENCOUNTER — Inpatient Hospital Stay (HOSPITAL_COMMUNITY): Payer: Medicare Other

## 2019-08-24 ENCOUNTER — Inpatient Hospital Stay (HOSPITAL_COMMUNITY): Payer: Medicare Other | Admitting: Occupational Therapy

## 2019-08-24 ENCOUNTER — Encounter (HOSPITAL_COMMUNITY): Payer: Medicare Other | Admitting: Occupational Therapy

## 2019-08-24 LAB — CBC
HCT: 31.6 % — ABNORMAL LOW (ref 36.0–46.0)
Hemoglobin: 10.2 g/dL — ABNORMAL LOW (ref 12.0–15.0)
MCH: 30.5 pg (ref 26.0–34.0)
MCHC: 32.3 g/dL (ref 30.0–36.0)
MCV: 94.6 fL (ref 80.0–100.0)
Platelets: 328 10*3/uL (ref 150–400)
RBC: 3.34 MIL/uL — ABNORMAL LOW (ref 3.87–5.11)
RDW: 13.1 % (ref 11.5–15.5)
WBC: 11.8 10*3/uL — ABNORMAL HIGH (ref 4.0–10.5)
nRBC: 0 % (ref 0.0–0.2)

## 2019-08-24 LAB — BASIC METABOLIC PANEL
Anion gap: 8 (ref 5–15)
BUN: 23 mg/dL (ref 8–23)
CO2: 31 mmol/L (ref 22–32)
Calcium: 8.5 mg/dL — ABNORMAL LOW (ref 8.9–10.3)
Chloride: 100 mmol/L (ref 98–111)
Creatinine, Ser: 0.82 mg/dL (ref 0.44–1.00)
GFR calc Af Amer: >60 mL/min (ref >60)
GFR calc non Af Amer: >60 mL/min (ref >60)
Glucose, Bld: 115 mg/dL — ABNORMAL HIGH (ref 70–99)
Potassium: 4.6 mmol/L (ref 3.5–5.1)
Sodium: 139 mmol/L (ref 135–145)

## 2019-08-24 NOTE — Progress Notes (Signed)
[place] PHYSICAL MEDICINE & REHABILITATION PROGRESS NOTE   Subjective/Complaints: Continues to have pain today. Legs bothering her more last night. "seems like I have a good night and then the next night is not so good." She did sleep well after midnight after getting her pain medication. I don't see any meds given at midnight in her med review, but she did get Oxycodone this morning.   ROS:  Pt denies SOB, abd pain, CP, N/V/C/D, and vision changes   Objective:   No results found. Recent Labs    08/22/19 0550 08/24/19 0542  WBC 13.8* 11.8*  HGB 9.5* 10.2*  HCT 28.8* 31.6*  PLT 318 328   Recent Labs    08/24/19 0542  NA 139  K 4.6  CL 100  CO2 31  GLUCOSE 115*  BUN 23  CREATININE 0.82  CALCIUM 8.5*    Intake/Output Summary (Last 24 hours) at 08/24/2019 2979 Last data filed at 08/24/2019 8921 Gross per 24 hour  Intake 716 ml  Output 0 ml  Net 716 ml     Physical Exam: Vital Signs Blood pressure 126/60, pulse 79, temperature 97.8 F (36.6 C), resp. rate 18, height 5\' 8"  (1.727 m), weight 72 kg, SpO2 97 %. General: Alert and oriented x 3, No apparent distress HEENT: Head is normocephalic, atraumatic, PERRLA, EOMI, sclera anicteric, oral mucosa pink and moist, dentition intact, ext ear canals clear,  Neck: Supple without JVD or lymphadenopathy Heart: Reg rate and rhythm. No murmurs rubs or gallops Chest: CTA bilaterally without wheezes, rales, or rhonchi; no distress Abdomen: Soft, non-tender, non-distended, bowel sounds positive. Extremities: No clubbing, cyanosis, or edema. Pulses are 2+ Skin: dressing incisions  In place- no change Neuro: Pt is cognitively appropriate with normal insight, memory, and awareness. Cranial nerves 2-12 are intact. Sensory exam is normal. Fine motor coordination is intact. No tremors. Motor function is grossly 5/5 in bilateral upper extremities. LLE grossly 4+/5. RLE with HF and KE at 2/5 and DF/PF 5-/5 Still not able to lift leg  off bed- but says can when walking Psych: pt appropriate  Assessment/Plan: 1. Functional deficits secondary to impaired mobility and ADLs secondary to L5-S1 anterior lumbar interbody fusion and L1-L5 anterolateral lumbar interbody fusion for stage 1 scoliosis, complicated by dysesthesias in RLE, which require 3+ hours per day of interdisciplinary therapy in a comprehensive inpatient rehab setting.  Physiatrist is providing close team supervision and 24 hour management of active medical problems listed below.  Physiatrist and rehab team continue to assess barriers to discharge/monitor patient progress toward functional and medical goals  Care Tool:  Bathing    Body parts bathed by patient: Face, Right upper leg, Left upper leg, Right arm, Left arm, Chest, Abdomen, Front perineal area   Body parts bathed by helper: Right lower leg, Left lower leg, Buttocks     Bathing assist Assist Level: Moderate Assistance - Patient 50 - 74%     Upper Body Dressing/Undressing Upper body dressing   What is the patient wearing?: Button up shirt Orthosis activity level: Performed by patient  Upper body assist Assist Level: Set up assist    Lower Body Dressing/Undressing Lower body dressing      What is the patient wearing?: Pants     Lower body assist Assist for lower body dressing: Minimal Assistance - Patient > 75%     Toileting Toileting    Toileting assist Assist for toileting: Moderate Assistance - Patient 50 - 74%     Transfers Chair/bed  transfer  Transfers assist     Chair/bed transfer assist level: Contact Guard/Touching assist Chair/bed transfer assistive device: Programmer, multimedia   Ambulation assist      Assist level: Contact Guard/Touching assist Assistive device: Walker-rolling Max distance: 100'   Walk 10 feet activity   Assist     Assist level: Contact Guard/Touching assist Assistive device: Walker-rolling   Walk 50 feet  activity   Assist Walk 50 feet with 2 turns activity did not occur: Safety/medical concerns  Assist level: Contact Guard/Touching assist Assistive device: Walker-rolling    Walk 150 feet activity   Assist Walk 150 feet activity did not occur: Safety/medical concerns  Assist level: Contact Guard/Touching assist Assistive device: Walker-rolling    Walk 10 feet on uneven surface  activity   Assist     Assist level: Minimal Assistance - Patient > 75% Assistive device: Aeronautical engineer Will patient use wheelchair at discharge?: No             Wheelchair 50 feet with 2 turns activity    Assist            Wheelchair 150 feet activity     Assist          Blood pressure 126/60, pulse 79, temperature 97.8 F (36.6 C), resp. rate 18, height 5\' 8"  (1.727 m), weight 72 kg, SpO2 97 %.  Medical Problem List and Plan: 1.  Impaired mobility and ADLs secondary to L5-S1 anterior lumbar interbody fusion and L1-L5 anterolateral lumbar interbody fusion for stage 1 scoliosis, complicated by dysesthesias in RLE.             -patient may shower but incision must be covered  6/23- Wearing TLSO             -ELOS/Goals: modI 12-16 days  -Continue CIR therapies. 2.  Antithrombotics: -DVT/anticoagulation:  Pharmaceutical: Lovenox- d/ced since patient ambulated 300 feet 6/17 3. Pain Management: Continue Oxycodone prn--d/c hydrodocone/hydromorphone. On valium and robaxin prn for muscle spasms.   6/20: Continues to have painful right lower extremity dysesthesias. On Tramadol and Gabapentin TID and has prn Oxycodone and Methocarbamol which she has been using.   6/21- increased Gabapentin to 600 mg TID for nerve pain  6/24-  Less pain due to steroid taper- did 7 days taper- on 60 mg today-  6/26- educated on steroids and pain being expected after surgery- she keeps complaining about it, because she says it's bad and she's scared "something wrong"-  explained we don't think so. Dr Vertell Limber agrees   6/27- pt reports steroids are helpful- pain better- is on rapid taper  6/28: pain fluctuates, worse this morning.  4. Mood: LCSW to follow for evaluation and support. Anxious at times. Klonopin 0.25mg  prn ordered.              -antipsychotic agents: N/A 5. Neuropsych: This patient is capable of making decisions on her own behalf. 6. Skin/Wound Care: Honeycomb dressing along spinal incision without drainage. Incision sites in right iliac crest with skin glue.  6/24- d/c honeycomb dressing  Continue to monitor. Dressing removed 6/20.  7. Fluids/Electrolytes/Nutrition: Monitor I/O.  8. HTN: Monitor BP tid--continue Maxzide. Labile- continue to monitor.  9. Neuropathy: Gabapentin increased to 400 mg tid on 06/14. Continues on 4mg  IV decadron every 8 hrs.              6/14: Gabapentin was increased to 400mg  TID as worst pain is currently  the neuropathic pain in RLE. If well tolerated and pain persists, can consider increasing dose further.   6/21- increased gabapentin to 600 mg TID  6/23- a little sleepy- but pain better today.   6/25- on steroid taper- helping some 10. Hypothyroid: Continue supplement.  11. Thrush: Continue Nystatin swish and swallow. Improving.  12. Leukocytosis: 2/2 steroids. 17.3 on 6/15. Continue to monitor weekly.  13. Blood loss anemia: 10.4 on 6/15. Continue to monitor weekly. 14. Hypocalcemia: Ca 8.5 on 6/15. Started on calcium supplement. Monitor weekly.   15. Constipation: Receiving dig stim HS without much benefit. Does receive benefit from soaps suds enema. Will order daily PRN to be uses with bowel program. Patient states she used to use this at home.   6/22- will add enema daily since did at home  6/25- didn't need last 2 days  6/27- LBM overnight- doing well 16. Insomnia: Improved with amitriptyline. It does make her very groggy during the day but she would like to continue at this time.   6/27- sleeping great 17.  Leukocytosis  6/21- pt's WBC is up to 21k- was 17k- will check U/A and Cx and have PA check incision if possible to make sure not cause of infection- not on prednisone.  Is afebrile and said doesn't feel bad.   6/22- U/A is completely negative- just finished IV steroids, so likely the cause of leukocytosis- will monitor with daily CBC to make sure down trending- is pending now.   6/24- CBC down to 15.5k  6/26- WBC 13.8- due to steroid taper  6/28: trending downward 18. Hypokalemia  6/23- will replete K+- 40 mEq x 2  6/24- K+ 4.8 now s/Wagner repletion  6/28: K+ stable  LOS: 14 days A FACE TO FACE EVALUATION WAS PERFORMED  Stacy Wagner Stacy Wagner 08/24/2019, 8:32 AM

## 2019-08-24 NOTE — Progress Notes (Signed)
Occupational Therapy Session Note  Patient Details  Name: Stacy Wagner MRN: [id number] Date of Birth: 1941-01-24  Today's Date: 08/24/2019 OT Individual Time: 1001-1059 and 1347-1435 OT Individual Time Calculation (min): 58 min and 48 min   Short Term Goals: Week 2:  OT Short Term Goal 1 (Week 2): Pt will be able to pull pants down with min A in standing to prep for toileting. OT Short Term Goal 2 (Week 2): Pt will don socks with sock aid with supervision. OT Short Term Goal 3 (Week 2): Pt will perform LB dressing with Min A with use of reacher OT Short Term Goal 4 (Week 2): Pt will perform UB/LB bathing with Min A with use of AE as needed  Skilled Therapeutic Interventions/Progress Updates:    Pt greeted at time of session reclined in bed supine with family present for family ed session, agreeable to perform ADL routine with family training. Bed mobility supine to sitting up using bed rail with log roll, Min-Mod A. Mild pain throughout session, less than previous sessions but no number given. Pt and daughter donned brace with supervision, verbal cues from therapist only. Ambulated short distance to bathroom with RW w/ CGA, performed UB/LB bathing at shower level simulated for tub/shower unit at home with Min A only to reach BLEs past knee level and buttocks. Tegaderm used for incision. Reviewed with pt and daughter long handled sponges and other AE for ADLs when home. Dried off in the same manner and performed UB dressing supervision, LB dressing Min A to thread pants with pt able to static stand without support to don over hips. After dressing and brace donned, walked short distance to toilet and performed toileting Min A, only help provided for pants. Daughter and pt receptive to techniques, cues, instructions and good carryover noted throughout. Pt up in her wheelchair with alarm on, call bell in reach, all needs met. Pt and family aware of upcoming DC this week.   Session 2: Pt greeted at  time of session on toilet with NT, ambulated back to room with CGA and family present. Continued to collaborate with family regarding home DC planning and they are very receptive and supportive. Pt ambulated to therapy gym and performed dynamic standing at [employer] for 2 minute intervals with crossing midline and standing without UE support to improve standing balance for LB ADLs. Dynavision also performed to decrease flexed neck posture and encourage looking up at eye level for tasks. SCIFIT for 4 minutes on level 2 before pt stated she needed to urgently use the bathroom, brought back via wheelchair and therapist assisted with toileting for urgency. New brief provided and pt able to don with CGA, clothing management CGA-Min. Pt back up in wheelchair in room with alarm on, call bell in reach, all needs met.   Therapy Documentation Precautions:  Precautions Precautions: Back, Fall Precaution Comments: reviewed 3/3 back precautions Required Braces or Orthoses: Spinal Brace Spinal Brace: Thoracolumbosacral orthotic, Applied in sitting position Restrictions Weight Bearing Restrictions: No    Therapy/Group: Individual Therapy  Viona Gilmore 08/24/2019, 11:16 AM

## 2019-08-24 NOTE — Progress Notes (Signed)
Patient ID: Stacy Wagner, female   DOB: [birth year], 79 y.o.   MRN: [id number]  SW met with pt dtr to discuss [facility] preference and DME items:TTB and 3in1 BSC. Preferred HH is [facility]. SW to order DME items. SW informed will provide updates after team conference.   SW sent HHPT/OT/Aide to [name]. SW waiting on follow-up.   [name], MSW, [place] Office: [phone] Cell: [phone] Fax: [phone]

## 2019-08-24 NOTE — Progress Notes (Signed)
Physical Therapy Session Note  Patient Details  Name: Stacy Wagner MRN: [id number] Date of Birth: 02/15/1941  Today's Date: 08/24/2019 PT Individual Time: 1105-1200 and 1500-1529 PT Individual Time Calculation (min): 55 min and 29 min  Short Term Goals: Week 2:  PT Short Term Goal 1 (Week 2): STGs = LTGs due to ELOS  Skilled Therapeutic Interventions/Progress Updates:     1st Session: Pt's daughter present for family education. Pt seated in WC and agreeable to therapy. Reports some radiating pain in RLE. Number not provided. PT provides rest breaks as needed and positioning for pain management. Pt ambulates 140' with RW and supervision for cues on maintaining upright gaze to improve posture and balance and increasing gait speed to decrease risk for falls. With daughter guarding pt, pt performs car transfer with RW and minA for RLE assistance clearing edge of car. Pt ambulates additional 140' with daughter providing close supervision and PT cuing on positioning. Pt performs x4 steps with RHR and PT providing CGA, then x4 steps with daughter providing CGA, and additional x4 steps with PT guarding and providing tactile cuing at R quad for improved contraction and stability. Pt performs sit<>supine with supervision and cues for logrolling technique, sequencing, trunk control, and use of momentum to facilitate clearing RLE over edge of bed. Pt ambulates 160' back to room with daughter guarding. Left seated in WC with alarm intact and all needs within reach.  2nd Session: Pt received seated in WC and agreeable to therapy. Reports tingling pain in RLE. Number not provided PT provides rest breaks as needed to manage pain symptoms. WC transport to therapy gym for time management. Pt performs gait speed training. Pt ambulates 30' with 2 turns at self selected speed with time of 1:15. Pt then ambulates same distance  with PT assisting to propel RW with time of 35 seconds. PT educates on importance of  increasing gait speed and pt trials same distance multiple times without PT assistance. Pt averages 33 seconds over 8 trials. Pt then ambulates additional 160' with RW and supervision with cues for upright gaze. Left seated in WC with alarm intact and all needs within reach.  Therapy Documentation Precautions:  Precautions Precautions: Back, Fall Precaution Comments: reviewed 3/3 back precautions Required Braces or Orthoses: Spinal Brace Spinal Brace: Thoracolumbosacral orthotic, Applied in sitting position Restrictions Weight Bearing Restrictions: No    Therapy/Group: Individual Therapy  Breck Coons, PT, DPT 08/24/2019, 3:39 PM

## 2019-08-24 NOTE — Plan of Care (Signed)
  Problem: Consults Goal: RH GENERAL PATIENT EDUCATION Description: Patient will be aware of their progress and know their expected goals and expected discharge date.    Outcome: Progressing Goal: Skin Care Protocol Initiated - if Braden Score 18 or less Description: If consults are not indicated, leave blank or document N/A Outcome: Progressing Goal: Nutrition Consult-if indicated Outcome: Progressing   Problem: RH BOWEL ELIMINATION Goal: RH STG MANAGE BOWEL WITH ASSISTANCE Description: STG Manage Bowel with mod Assistance. Outcome: Progressing Goal: RH STG MANAGE BOWEL W/MEDICATION W/ASSISTANCE Description: STG Manage Bowel with Medication with mod Assistance. Outcome: Progressing   Problem: RH BLADDER ELIMINATION Goal: RH STG MANAGE BLADDER WITH ASSISTANCE Description: Patient will be continent of bladder prior to discharge and able to empty bladder when urinating.  Outcome: Progressing Goal: RH STG MANAGE BLADDER WITH MEDICATION WITH ASSISTANCE Description: STG Manage Bladder With Medication With min Assistance. Outcome: Progressing   Problem: RH SKIN INTEGRITY Goal: RH STG SKIN FREE OF INFECTION/BREAKDOWN Description: Patient will remain free of skin tears and no new signs or symptoms of infection will appear.  Outcome: Progressing Goal: RH STG MAINTAIN SKIN INTEGRITY WITH ASSISTANCE Description: STG Maintain Skin Integrity With min Assistance. Outcome: Progressing Goal: RH STG ABLE TO PERFORM INCISION/WOUND CARE W/ASSISTANCE Description: STG Able To Perform Incision/Wound Care With min Assistance. Outcome: Progressing   Problem: RH SAFETY Goal: RH STG ADHERE TO SAFETY PRECAUTIONS W/ASSISTANCE/DEVICE Description: Patient will refrain from falls and ask for assistance as needed.  Outcome: Progressing Goal: RH STG DECREASED RISK OF FALL WITH ASSISTANCE Description: STG Decreased Risk of Fall With mod I Assistance. Outcome: Progressing   Problem: RH PAIN  MANAGEMENT Goal: RH STG PAIN MANAGED AT OR BELOW PT'S PAIN GOAL Description: Less than 3 out of 10 Outcome: Progressing   Problem: RH KNOWLEDGE DEFICIT GENERAL Goal: RH STG INCREASE KNOWLEDGE OF SELF CARE AFTER HOSPITALIZATION Description: Patient will know how to follow up with appointments and aware of how to take care of herself and prevent any unwanted hospital readmissions.  Outcome: Progressing

## 2019-08-25 ENCOUNTER — Inpatient Hospital Stay (HOSPITAL_COMMUNITY): Payer: Medicare Other

## 2019-08-25 ENCOUNTER — Inpatient Hospital Stay (HOSPITAL_COMMUNITY): Payer: Medicare Other | Admitting: Occupational Therapy

## 2019-08-25 ENCOUNTER — Ambulatory Visit (HOSPITAL_COMMUNITY): Payer: Medicare Other

## 2019-08-25 ENCOUNTER — Encounter (HOSPITAL_COMMUNITY): Payer: Medicare Other | Admitting: Occupational Therapy

## 2019-08-25 MED ORDER — MUSCLE RUB 10-15 % EX CREA: 1.0000 "application " | TOPICAL_CREAM | CUTANEOUS | 0 refills | Status: DC | PRN

## 2019-08-25 MED ORDER — ACETAMINOPHEN 325 MG PO TABS: 325.0000 mg | ORAL_TABLET | ORAL | Status: AC | PRN

## 2019-08-25 MED ORDER — CALCIUM CITRATE 950 (200 CA) MG PO TABS: 200.0000 mg | ORAL_TABLET | Freq: Every day | ORAL | Status: DC

## 2019-08-25 NOTE — Plan of Care (Signed)
  Problem: Consults Goal: RH GENERAL PATIENT EDUCATION Description: Patient will be aware of their progress and know their expected goals and expected discharge date.    Outcome: Progressing Goal: Skin Care Protocol Initiated - if Braden Score 18 or less Description: If consults are not indicated, leave blank or document N/A Outcome: Progressing Goal: Nutrition Consult-if indicated Outcome: Progressing   Problem: RH BOWEL ELIMINATION Goal: RH STG MANAGE BOWEL WITH ASSISTANCE Description: STG Manage Bowel with mod Assistance. Outcome: Progressing Goal: RH STG MANAGE BOWEL W/MEDICATION W/ASSISTANCE Description: STG Manage Bowel with Medication with mod Assistance. Outcome: Progressing   Problem: RH BLADDER ELIMINATION Goal: RH STG MANAGE BLADDER WITH ASSISTANCE Description: Patient will be continent of bladder prior to discharge and able to empty bladder when urinating.  Outcome: Progressing Goal: RH STG MANAGE BLADDER WITH MEDICATION WITH ASSISTANCE Description: STG Manage Bladder With Medication With min Assistance. Outcome: Progressing   Problem: RH SKIN INTEGRITY Goal: RH STG SKIN FREE OF INFECTION/BREAKDOWN Description: Patient will remain free of skin tears and no new signs or symptoms of infection will appear.  Outcome: Progressing Goal: RH STG MAINTAIN SKIN INTEGRITY WITH ASSISTANCE Description: STG Maintain Skin Integrity With min Assistance. Outcome: Progressing Goal: RH STG ABLE TO PERFORM INCISION/WOUND CARE W/ASSISTANCE Description: STG Able To Perform Incision/Wound Care With min Assistance. Outcome: Progressing   Problem: RH SAFETY Goal: RH STG ADHERE TO SAFETY PRECAUTIONS W/ASSISTANCE/DEVICE Description: Patient will refrain from falls and ask for assistance as needed.  Outcome: Progressing Goal: RH STG DECREASED RISK OF FALL WITH ASSISTANCE Description: STG Decreased Risk of Fall With mod I Assistance. Outcome: Progressing   Problem: RH PAIN  MANAGEMENT Goal: RH STG PAIN MANAGED AT OR BELOW PT'S PAIN GOAL Description: Less than 3 out of 10 Outcome: Progressing   Problem: RH KNOWLEDGE DEFICIT GENERAL Goal: RH STG INCREASE KNOWLEDGE OF SELF CARE AFTER HOSPITALIZATION Description: Patient will know how to follow up with appointments and aware of how to take care of herself and prevent any unwanted hospital readmissions.  Outcome: Progressing

## 2019-08-25 NOTE — Discharge Summary (Signed)
Physician Discharge Summary  Patient ID: Stacy Wagner MRN: [id number] DOB/AGE: 77-Apr-1942 77 y.o.  Admit date: 08/10/2019 Discharge date: 08/26/2019  Discharge Diagnoses:  Principal Problem:   Scoliosis of lumbosacral spine Active Problems:   Essential hypertension   GERD (gastroesophageal reflux disease)   Dysesthesia RLE   Constipation due to neurogenic bowel   Leucocytosis   Acute blood loss anemia   Insomnia   Discharged Condition: stable  Significant Diagnostic Studies: N/A   Labs:  Basic Metabolic Panel: BMP Latest Ref Rng & Units 08/24/2019 08/20/2019 08/17/2019  Glucose 70 - 99 mg/dL 115(H) 119(H) 134(H)  BUN 8 - 23 mg/dL 23 18 24(H)  Creatinine 0.44 - 1.00 mg/dL 0.82 0.71 0.84  Sodium 135 - 145 mmol/L 139 133(L) 132(L)  Potassium 3.5 - 5.1 mmol/L 4.6 4.8 3.3(L)  Chloride 98 - 111 mmol/L 100 95(L) 95(L)  CO2 22 - 32 mmol/L 31 31 28   Calcium 8.9 - 10.3 mg/dL 8.5(L) 8.2(L) 8.2(L)    CBC: Recent Labs  Lab 08/21/19 0526 08/22/19 0550 08/24/19 0542  WBC 12.1* 13.8* 11.8*  NEUTROABS 9.0* 11.4*  --   HGB 10.3* 9.5* 10.2*  HCT 31.1* 28.8* 31.6*  MCV 94.0 92.6 94.6  PLT 327 318 328    CBG: No results for input(s): GLUCAP in the last 168 hours.  Brief HPI:   Stacy Wagner is a 79 y.o. female with history of multiple back surgeries with progressive back pain due to lumbar DDD with scoliosis and imbalance. She was admitted on 08/04/19 for 2 stage procedure. She underwent anterior retroperitoneal exposure L5-S1 by Dr, Scot Dock and L5-S1 fusion followed by L1-L5 anterolateral fusion on 06/08 and posterolateral T10-pelvis fusion and L1-L5 anterolateral fusion on 06/11. Post op she was limited by pain with neuropathy and IV decadron added and gabapentin was being titrated upwards. Lumbar drain removed prior to discharge. Therapy was ongoing and CIR recommended due to functional deficits.    Hospital Course: Stacy Wagner was admitted to rehab 08/10/2019 for  inpatient therapies to consist of PT and OT at least three hours five days a week. Past admission physiatrist, therapy team and rehab RN have worked together to provide customized collaborative inpatient rehab.She continued to be limited by severe RLE dysesthesias causing insomnia as well as high levels of anxiety. She was weaned off IV steriods by 06/20 and reactive leucocytosis was resolving. Due to ongoing pain, she was treated with another short course of prednisone dose pack from 6/25-6/30 jper input from Dr. Vertell Limber.  Serial BMET showed hypokalemia that has resolved with brief supplementation. Mild hyponatremia has resolved.  Gabapentin was added and titrated upwards to help manage symptoms. Low dose elavil was added to help manage symptoms and has help with insomnia.   Pain is control is better during the day and team has been providing ego support as well as education.  She was found to have thrush which was treated with nystatin mouthwash. Blood pressures were monitored on TID basis and has been controlled. Bladder function has improved and she continent of bladder. Bowel program has been adjusted and enema was scheduled after supper which has been effective. Her back incision has been healing well without signs or symptoms of infection. She has made good progress during her stay and is currently at min assist to supervision level. She will continue to receive follow up HHPT, SNA and HHOT by [facility] after discharge.    Rehab course: During patient's stay in rehab weekly team conferences  were held to monitor patient's progress, set goals and discuss barriers to discharge. At admission, patient required max assist with ADL tasks and mod assist with mobility. She  has had improvement in activity tolerance, balance, postural control as well as ability to compensate for deficits. She requires supervision for most UB ADLs and CGA to min assist for LB ADLs. She requires supervision with cues for  transfers and to ambulate 200' with RW. She has been educated on HEP and BUE strengthening exercises. Family education was completed regarding all aspects of care and safety.   Disposition: Home  Diet: Regular.   Special Instructions: 1. Maintain back precautions.  2. Wear back brace when at EOB or out of bed.    Discharge Instructions    Ambulatory referral to Physical Medicine Rehab   Complete by: As directed    1-2 weeks TC appointment     Allergies as of 08/26/2019      Reactions   Codeine Nausea Only      Medication List    STOP taking these medications   cyclobenzaprine 5 MG tablet Commonly known as: FLEXERIL   estrogens-methylTEST 1.25-2.5 MG tablet Commonly known as: ESTRATEST   ibuprofen 200 MG tablet Commonly known as: ADVIL   Lysine 500 MG Tabs   meclizine 25 MG tablet Commonly known as: ANTIVERT   OVER THE COUNTER MEDICATION     TAKE these medications   acetaminophen 325 MG tablet Commonly known as: TYLENOL Take 1-2 tablets (325-650 mg total) by mouth every 4 (four) hours as needed for mild pain. What changed:   medication strength  how much to take  when to take this  reasons to take this   amitriptyline 10 MG tablet Commonly known as: ELAVIL Take 1 tablet (10 mg total) by mouth at bedtime.   b complex vitamins tablet Take 1 tablet by mouth daily.   calcium citrate 950 (200 Ca) MG tablet Commonly known as: CALCITRATE - dosed in mg elemental calcium Take 1 tablet (200 mg of elemental calcium total) by mouth daily.   clonazePAM 0.25 MG disintegrating tablet Commonly known as: KLONOPIN Take 1 tablet (0.25 mg total) by mouth 2 (two) times daily as needed (anxiety).   diphenhydrAMINE 25 MG tablet Commonly known as: BENADRYL Take 25 mg by mouth at bedtime as needed for sleep.   esomeprazole 20 MG capsule Commonly known as: NEXIUM Take 20 mg by mouth at bedtime.   gabapentin 300 MG capsule Commonly known as: NEURONTIN Take 2  capsules (600 mg total) by mouth 3 (three) times daily.   levothyroxine 75 MCG tablet Commonly known as: SYNTHROID TAKE 1 TABLET BY MOUTH EVERY DAY ON AN EMPTY STOMACH What changed:   how much to take  how to take this  when to take this   methocarbamol 750 MG tablet Commonly known as: ROBAXIN Take 1 tablet (750 mg total) by mouth every 6 (six) hours as needed for muscle spasms.   Muscle Rub 10-15 % Crea Apply 1 application topically as needed for muscle pain. To both legs as needed   OSTEO COMPLEX PO Take 1 tablet by mouth daily.   multivitamin with minerals tablet Take 1 tablet by mouth daily.   HAIR/SKIN/NAILS/BIOTIN PO Take 1 tablet by mouth daily.   oxyCODONE 5 MG immediate release tablet Commonly known as: Oxy IR/ROXICODONE Take 1-2 tablets (5-10 mg total) by mouth every 3 (three) hours as needed for severe pain.   polyethylene glycol 17 g packet Commonly known as:  MIRALAX / GLYCOLAX Take 17 g by mouth daily.   traMADol 50 MG tablet Commonly known as: ULTRAM Take 1 tablet (50 mg total) by mouth 4 (four) times daily - after meals and at bedtime. What changed:   when to take this  reasons to take this   triamcinolone cream 0.1 % Commonly known as: KENALOG Apply 1 application topically 2 (two) times daily as needed (irritation).   triamterene-hydrochlorothiazide 75-50 MG tablet Commonly known as: MAXZIDE Take 1 tablet by mouth daily with breakfast.   VITAMIN C PO Take 1 tablet by mouth daily.   VITAMIN D PO Take 1 tablet by mouth daily.       Follow-up Information    Lovorn, Jinny Blossom, MD Follow up.   Specialty: Physical Medicine and Rehabilitation Why: Office will call you with follow up appointment Contact information: [address] [place] [address] [phone]        Erline Levine, MD Follow up on 08/27/2019.   Specialty: Neurosurgery Why: to set up post op appointment Contact information: [place]. [address] [place] [address] [phone]        Eulas Post, MD. Call on 08/25/2019.   Specialty: Family Medicine Why: for post hospital follow up Contact information: [place] [address] [phone]               Signed: Bary Leriche 08/27/2019, 10:35 PM

## 2019-08-25 NOTE — Patient Care Conference (Signed)
Inpatient RehabilitationTeam Conference and Plan of Care Update Date: 08/25/2019   Time: 4:55 PM    Patient Name: Stacy Wagner      Medical Record Number: [id number]  Date of Birth: October 20, 1940 Sex: Female         Room/Bed: 4M02C/4M02C-01 Payor Info: Payor: MEDICARE / Plan: MEDICARE PART A AND B / Product Type: *No Product type* /    Admit Date/Time:  08/10/2019  4:28 PM  Primary Diagnosis:  Scoliosis of lumbosacral spine  Patient Active Problem List   Diagnosis Date Noted  . Scoliosis of lumbosacral spine 08/10/2019  . Idiopathic scoliosis of thoracolumbar region 08/04/2019  . Colon adenomas 10/21/2018  . Cancer of lateral margin of anterior two-thirds of tongue ([place]) 01/15/2018  . Bursitis of left shoulder 08/10/2015  . Postmenopausal 10/15/2014  . Dizziness and giddiness 08/13/2014  . Nausea without vomiting 08/13/2014  . GERD (gastroesophageal reflux disease) 10/08/2013  . Obesity (BMI 30-39.9) 10/01/2012  . VITAMIN D DEFICIENCY 09/08/2009  . Hyperlipidemia 09/08/2009  . FREQUENCY, URINARY 09/08/2009  . UNS ADVRS EFF OTH RX MEDICINAL&BIOLOGICAL SBSTNC 09/08/2009  . INSOMNIA 08/25/2008  . WEIGHT GAIN 08/25/2008  . DEGENERATIVE JOINT DISEASE, CERVICAL SPINE 07/15/2008  . Essential hypertension 06/28/2008  . OSTEOARTHRITIS 06/28/2008  . NECK PAIN 06/28/2008  . Hypothyroidism 08/20/2007  . LABYRINTHITIS, CHRONIC 08/20/2007  . EDEMA 08/20/2007  . HEADACHE 08/20/2007  . SYNDROME, CARPAL TUNNEL 08/15/2006    Expected Discharge Date: Expected Discharge Date: 08/26/19  Team Members Present: Physician leading conference: Dr. Courtney Heys Care Coodinator Present: [name], LCSWA;Dim Meisinger Creig Hines, RN, BSN, CRRN Nurse Present: Serena Croissant, LPN PT Present: Tereasa Coop, PT OT Present: Lillia Corporal, OT PPS Coordinator present : Gunnar Fusi, SLP     Current Status/Progress Goal Weekly Team Focus  Bowel/Bladder   Pt continent of bowel and bladder. Pt able to empty  bladder. LBM 08/24/2019  Pt will be contient of B/B, with normal bowel pattern.  Continue with toileting and PVR.   Swallow/Nutrition/ Hydration             ADL's   Min overall for ADLs with reacher for LB dressing, CGA for toilet transfers, CGA-Min A for clothing management, Min for LB bathing, don brace with CGA, good at maintaining precautions  Supervision with AE  continued use of AE/reacher, ADL transfers, LB dressing, continue with pt/family donning brace   Mobility   supervision bed mobility, funcitonal transfers, gait 300' with RW. CGA 12 steps with RHR. Improving gait speed.  Supervision  Bed mobility, gait mechanics, DC.   Communication             Safety/Cognition/ Behavioral Observations            Pain   Pt's pain managed with scheduled Tramadol and PRN Oxycodone. Pain rating 6/10  Pt pain will be less than 3.  Assess pain QS/PRN   Skin   Surgical incision with surgical glue to spine, incisions to lower abdomen and on right flank. All approximated with no S/Sx of infection  Skin will be free of infection and further breakdown.  Assess surgical sites QS/PRN    Rehab Goals Patient on target to meet rehab goals: Yes *See Care Plan and progress notes for long and short-term goals.     Barriers to Discharge  Current Status/Progress Possible Resolutions Date Resolved   Nursing                  PT  OT                  SLP                Care Coordinator Decreased caregiver support;Lack of/limited family support              Discharge Planning/Teaching Needs:  D/c to home with support from dtr and husband.  Family education as recommended   Team Discussion:  Continent of bowel/bladder. Pain controlled. Pt ready for discharge. Equipment ordered. Pt has HH. Family received Education.  Revisions to Treatment Plan:  None.    Medical Summary Current Status: doing great- skin good; bladder scans emptying- BMs better; refusing enemas as well; pain  better controlled- 2/10 Weekly Focus/Goal: daughter - family ed- ordered equipment- has H/H; OT-did family ed already- ready for d/c tomorrow- usually CGA_min A  Barriers to Discharge: Decreased family/caregiver support;Home enviroment access/layout;Weight bearing restrictions  Barriers to Discharge Comments: medically doing well- d/c tomorrow Possible Resolutions to Barriers: PT- getting her transport/transfer w/c; ready for d/c.   Continued Need for Acute Rehabilitation Level of Care: The patient requires daily medical management by a physician with specialized training in physical medicine and rehabilitation for the following reasons: Direction of a multidisciplinary physical rehabilitation program to maximize functional independence : Yes Medical management of patient stability for increased activity during participation in an intensive rehabilitation regime.: Yes Analysis of laboratory values and/or radiology reports with any subsequent need for medication adjustment and/or medical intervention. : Yes   I attest that I was present, lead the team conference, and concur with the assessment and plan of the team.   Cristi Loron 08/25/2019, 4:55 PM

## 2019-08-25 NOTE — Discharge Instructions (Signed)
Inpatient Rehab Discharge Instructions  CLARIS PECH Discharge date and time: 08/26/19   Activities/Precautions/ Functional Status: Activity: no lifting, driving, or strenuous exercise for till cleared by MD. Diet: regular diet Wound Care: Wash with soap and water. Keep wound clean and dry. Contact Dr. Vertell Limber if you develop any problems with your incision/wound--redness, swelling, increase in pain, drainage or if you develop fever or chills.   Functional status:  ___ No restrictions     ___ Walk up steps independently _X__ 24/7 supervision/assistance   ___ Walk up steps with assistance ___ Intermittent supervision/assistance  ___ Bathe/dress independently ___ Walk with walker     _X__ Bathe/dress with assistance ___ Walk Independently    ___ Shower independently ___ Walk with assistance    ___ Shower with assistance __X_ No alcohol     ___ Return to work/school ________  Special Instructions: 1. No bending, twisting, arching or driving till cleared by MD. 2. Wear brace when at edge of bed or out of bed.    COMMUNITY REFERRALS UPON DISCHARGE:    Home Health:   PT     OT  SNA                Agency: [facility]  Phone:[phone] *Please expect follow-up within 2-3 days to schedule your home visit. If you have not received follow-up, be sure to contact the branch directly.*    Medical Equipment/Items Ordered: 3in1 bedside commode, tub transfer bench, rolling walker, wheelchair (18x18)                                                 Agency/Supplier: [facility]     My questions have been answered and I understand these instructions. I will adhere to these goals and the provided educational materials after my discharge from the hospital.  Patient/Caregiver Signature _______________________________ Date __________  Clinician Signature _______________________________________ Date __________  Please bring this form and your medication  list with you to all your follow-up doctor's appointments.

## 2019-08-25 NOTE — Progress Notes (Signed)
Occupational Therapy Session Note  Patient Details  Name: Stacy Wagner MRN: [id number] Date of Birth: 1940/04/20  Today's Date: 08/25/2019 OT Individual Time: 0732-0800 OT Individual Time Calculation (min): 28 min    Short Term Goals: Week 2:  OT Short Term Goal 1 (Week 2): Pt will be able to pull pants down with min A in standing to prep for toileting. OT Short Term Goal 2 (Week 2): Pt will don socks with sock aid with supervision. OT Short Term Goal 3 (Week 2): Pt will perform LB dressing with Min A with use of reacher OT Short Term Goal 4 (Week 2): Pt will perform UB/LB bathing with Min A with use of AE as needed  Skilled Therapeutic Interventions/Progress Updates:    Pt greeted semi-reclined in bed with MD present checking pt's incision. Pt agreeable to OT treatment session. Pt completed log roll, and came to sitting EOB without assistance. OT set pt up with orthosis, then she was able to don in sitting. Pt stood w/ RW and mod I, then ambulated to the sink with supervision. Pt maintained standing balance without LOB while brushing her teeth and hair. Pt took seated rest break, then ambulated to and from therapy gym w/ RW and supervision. Pt left seated in wc with chair alarm on, call bell in reach, and needs met.   Therapy Documentation Precautions:  Precautions Precautions: Back, Fall Precaution Comments: reviewed 3/3 back precautions Required Braces or Orthoses: Spinal Brace Spinal Brace: Thoracolumbosacral orthotic, Applied in sitting position Restrictions Weight Bearing Restrictions: No Pain:  Pt reports she just had a pain pill and pain is manageable    Therapy/Group: Individual Therapy  Valma Cava 08/25/2019, 8:00 AM

## 2019-08-25 NOTE — Progress Notes (Signed)
Physical Therapy Discharge Summary  Patient Details  Name: Stacy Wagner MRN: [id number] Date of Birth: [birth year]  Today's Date: 08/25/2019 PT Individual Time: 6578-4696 and 2952-8413 PT Individual Time Calculation (min): 41 min and 31 min   Patient has met 7 of 9 long term goals due to improved activity tolerance, improved balance, improved postural control, increased strength and decreased pain.  Patient to discharge at an ambulatory level Supervision.   Patient's daughter attended family education and is independent to provide the necessary physical assistance at discharge.  Reasons goals not met: Pt requires assistance with RLE getting into vehicle. Daughter can assist. Still requires supervision for dynamic stand balance. Daughter can provide 24/7 supervision.  Recommendation:  Patient will benefit from ongoing skilled PT services in home health setting to continue to advance safe functional mobility, address ongoing impairments in BLE strength, bed mobility, transfers, ambulation, and minimize fall risk.  Equipment: RW, transport chair  Reasons for discharge: treatment goals met and discharge from hospital  Patient/family agrees with progress made and goals achieved: Yes   Skilled Therapeutic Interventions:  1st Session: Pt received seated in [facility] and agreeable to therapy. Reports pain in RLE, number not provided. PT provides rest breaks as needed to manage pain symptoms. WC transport to gym for time management. Pt perform car transfer with RW and minA from PT for RLE management. Pt ambulates up and down ramp with supervision as well as 150' with verbal cues to increase trunk and cervical extension for improved posture. Pt performs 12 steps with RHR and cues to facilitate R knee extension to prevent buckling.   PT educates pt on mobility recommendations following discharge home, as well as strategies for safe response in case of fall in home. Pt ambulates 150' back to room. Left  seated in WC with alarm intact and all needs within reach.   2nd Session: Pt received seated in WC and agreeable to therapy. Reports pain in RLE, number not provided. PT provides rest breaks as needed to manage pain symptoms. Pt ambulates 150', 200' with seated rest break. PT provides close supervision and verbal cues to maintain upright gaze to improve posture and balance.  PT provides pt with HEP and pt trial each exercise, including 1x20 standing marches with high knees.  Pt ambulates 300' with RW and same assist. PT answers pt and family questions regarding concerns for discharge. Pt left seated in WC with all needs within reach.  PT Discharge Precautions/Restrictions Restrictions Weight Bearing Restrictions: No Pain Pain Assessment Pain Scale: 0-10 Pain Score: 2  Pain Location: Back Vision/Perception  Perception Perception: Within Functional Limits Praxis Praxis: Intact  Cognition Overall Cognitive Status: Within Functional Limits for tasks assessed Arousal/Alertness: Awake/alert Orientation Level: Oriented X4 Safety/Judgment: Appears intact Sensation Sensation Light Touch: Appears Intact Coordination Gross Motor Movements are Fluid and Coordinated: Yes Fine Motor Movements are Fluid and Coordinated: Yes Motor  Motor Motor: Within Functional Limits Motor - Skilled Clinical Observations: generalized limitations due to pain  Mobility Bed Mobility Bed Mobility: Supine to Sit;Sit to Supine Rolling Right: Supervision/verbal cueing Supine to Sit: Supervision/Verbal cueing Sit to Supine: Supervision/Verbal cueing Transfers Transfers: Sit to Stand;Stand to Sit;Stand Pivot Transfers Sit to Stand: Supervision/Verbal cueing Stand to Sit: Supervision/Verbal cueing Stand Pivot Transfers: Supervision/Verbal cueing Stand Pivot Transfer Details: Verbal cues for sequencing;Verbal cues for technique Transfer (Assistive device): Rolling walker Locomotion  Gait Ambulation:  Yes Gait Assistance: Supervision/Verbal cueing Gait Distance (Feet): 200 Feet Assistive device: Rolling walker Gait Assistance Details: Verbal cues  for technique;Verbal cues for gait pattern Gait Gait: Yes Gait Pattern: Impaired Gait Pattern: Decreased stride length;Trunk flexed Gait velocity: decreased Stairs / Additional Locomotion Stairs: Yes Stairs Assistance: Supervision/Verbal cueing Stair Management Technique: One rail Right Number of Stairs: 12 Height of Stairs: 6 Ramp: Supervision/Verbal cueing Curb: Supervision/Verbal cueing Wheelchair Mobility Wheelchair Mobility: No  Trunk/Postural Assessment  Cervical Assessment Cervical Assessment: Exceptions to [facility] (forward head) Thoracic Assessment Thoracic Assessment: Exceptions to [facility] (rounded shoulders) Lumbar Assessment Lumbar Assessment: Exceptions to [facility] (posterior pelvic tilt in sitting) Postural Control Postural Control: Within Functional Limits  Balance Balance Balance Assessed: Yes Static Sitting Balance Static Sitting - Level of Assistance: 7: Independent Dynamic Sitting Balance Dynamic Sitting - Level of Assistance: 7: Independent Static Standing Balance Static Standing - Level of Assistance: 7: Independent Dynamic Standing Balance Dynamic Standing - Level of Assistance: 5: Stand by assistance Extremity Assessment  RLE Assessment RLE Assessment: Within Functional Limits General Strength Comments: Limited by pain, Hip and Knee grossly 3/5. PF/DF 4+/5 LLE Assessment LLE Assessment: Within Functional Limits General Strength Comments: Grossly 4+/5    Breck Coons, PT, DPT 08/25/2019, 3:31 PM

## 2019-08-25 NOTE — Progress Notes (Signed)
[place] PHYSICAL MEDICINE & REHABILITATION PROGRESS NOTE   Subjective/Complaints:  Pt reports good night o/n and good day yesterday- also good so far this AM- butt in RLE pain- "not bad".   Bowels "ok, finally".    ROS:   Pt denies SOB, abd pain, CP, N/V/C/D, and vision changes  Objective:   No results found. Recent Labs    08/24/19 0542  WBC 11.8*  HGB 10.2*  HCT 31.6*  PLT 328   Recent Labs    08/24/19 0542  NA 139  K 4.6  CL 100  CO2 31  GLUCOSE 115*  BUN 23  CREATININE 0.82  CALCIUM 8.5*    Intake/Output Summary (Last 24 hours) at 08/25/2019 0905 Last data filed at 08/25/2019 0700 Gross per 24 hour  Intake 451 ml  Output --  Net 451 ml     Physical Exam: Vital Signs Blood pressure (!) 125/54, pulse 77, temperature 98.2 F (36.8 C), temperature source Oral, resp. rate 16, height 5\' 8"  (1.727 m), weight 72 kg, SpO2 99 %. General: awake, alert, sitting up in bed; HOH, NAD HEENT: conjugate gaze Heart: RRR, no MR/G, no JVD Chest: CTA B/L- no W/R/R- good air movement Abdomen: Soft, NT, ND, (+)BS  Extremities: No clubbing, cyanosis, or edema. Pulses are 2+ Skin: back incision- glue in place- localized erythema and slightly warm from laying on it- but not hot, not really red, no drainage Neuro: Pt is cognitively appropriate with normal insight, memory, and awareness. Cranial nerves 2-12 are intact. Sensory exam is normal. Fine motor coordination is intact. No tremors. Motor function is grossly 5/5 in bilateral upper extremities. LLE grossly 4+/5. RLE with HF and KE at 2/5 and DF/PF 5-/5 Still not able to lift leg off bed- but says can when walking Psych: smiling for first time  Assessment/Plan: 1. Functional deficits secondary to impaired mobility and ADLs secondary to L5-S1 anterior lumbar interbody fusion and L1-L5 anterolateral lumbar interbody fusion for stage 1 scoliosis, complicated by dysesthesias in RLE, which require 3+ hours per day of  interdisciplinary therapy in a comprehensive inpatient rehab setting.  Physiatrist is providing close team supervision and 24 hour management of active medical problems listed below.  Physiatrist and rehab team continue to assess barriers to discharge/monitor patient progress toward functional and medical goals  Care Tool:  Bathing    Body parts bathed by patient: Face, Right upper leg, Left upper leg, Right arm, Left arm, Chest, Abdomen, Front perineal area   Body parts bathed by helper: Right lower leg, Left lower leg, Buttocks     Bathing assist Assist Level: Minimal Assistance - Patient > 75%     Upper Body Dressing/Undressing Upper body dressing   What is the patient wearing?: Button up shirt Orthosis activity level: Performed by patient  Upper body assist Assist Level: Supervision/Verbal cueing    Lower Body Dressing/Undressing Lower body dressing      What is the patient wearing?: Pants, Underwear/pull up     Lower body assist Assist for lower body dressing: Minimal Assistance - Patient > 75%     Toileting Toileting    Toileting assist Assist for toileting: Minimal Assistance - Patient > 75%     Transfers Chair/bed transfer  Transfers assist     Chair/bed transfer assist level: Supervision/Verbal cueing Chair/bed transfer assistive device: Programmer, multimedia   Ambulation assist      Assist level: Supervision/Verbal cueing Assistive device: Walker-rolling Max distance: 160'   Walk 10 feet  activity   Assist     Assist level: Supervision/Verbal cueing Assistive device: Walker-rolling   Walk 50 feet activity   Assist Walk 50 feet with 2 turns activity did not occur: Safety/medical concerns  Assist level: Supervision/Verbal cueing Assistive device: Walker-rolling    Walk 150 feet activity   Assist Walk 150 feet activity did not occur: Safety/medical concerns  Assist level: Supervision/Verbal cueing Assistive device:  Walker-rolling    Walk 10 feet on uneven surface  activity   Assist     Assist level: Minimal Assistance - Patient > 75% Assistive device: Aeronautical engineer Will patient use wheelchair at discharge?: No             Wheelchair 50 feet with 2 turns activity    Assist            Wheelchair 150 feet activity     Assist          Blood pressure (!) 125/54, pulse 77, temperature 98.2 F (36.8 C), temperature source Oral, resp. rate 16, height 5\' 8"  (1.727 m), weight 72 kg, SpO2 99 %.  Medical Problem List and Plan: 1.  Impaired mobility and ADLs secondary to L5-S1 anterior lumbar interbody fusion and L1-L5 anterolateral lumbar interbody fusion for stage 1 scoliosis, complicated by dysesthesias in RLE.             -patient may shower but incision must be covered  6/23- Wearing TLSO             -ELOS/Goals: modI 12-16 days  -Continue CIR therapies. 2.  Antithrombotics: -DVT/anticoagulation:  Pharmaceutical: Lovenox- d/ced since patient ambulated 300 feet 6/17 3. Pain Management: Continue Oxycodone prn--d/c hydrodocone/hydromorphone. On valium and robaxin prn for muscle spasms.   6/20: Continues to have painful right lower extremity dysesthesias. On Tramadol and Gabapentin TID and has prn Oxycodone and Methocarbamol which she has been using.   6/26- educated on steroids and pain being expected after surgery- she keeps complaining about it, because she says it's bad and she's scared "something wrong"- explained we don't think so. Dr Vertell Limber agrees   6/29- pain doing better, per pt- heading in right direction.  4. Mood: LCSW to follow for evaluation and support. Anxious at times. Klonopin 0.25mg  prn ordered.              -antipsychotic agents: N/A 5. Neuropsych: This patient is capable of making decisions on her own behalf. 6. Skin/Wound Care: Honeycomb dressing along spinal incision without drainage. Incision sites in right iliac crest with  skin glue.  6/24- d/c honeycomb dressing  Continue to monitor. Dressing removed 6/20.  7. Fluids/Electrolytes/Nutrition: Monitor I/O.  8. HTN: Monitor BP tid--continue Maxzide. Labile- continue to monitor.  9. Neuropathy: Gabapentin increased to 400 mg tid on 06/14. Continues on 4mg  IV decadron every 8 hrs.              6/14: Gabapentin was increased to 400mg  TID as worst pain is currently the neuropathic pain in RLE. If well tolerated and pain persists, can consider increasing dose further.   6/21- increased gabapentin to 600 mg TID  6/23- a little sleepy- but pain better today.   6/25- on steroid taper- helping some 10. Hypothyroid: Continue supplement.  11. Thrush: Continue Nystatin swish and swallow. Improving.  12. Leukocytosis: 2/2 steroids. 17.3 on 6/15. Continue to monitor weekly.  13. Blood loss anemia: 10.4 on 6/15. Continue to monitor weekly. 14. Hypocalcemia: Ca 8.5 on 6/15.  Started on calcium supplement. Monitor weekly.   15. Constipation: Receiving dig stim HS without much benefit. Does receive benefit from soaps suds enema. Will order daily PRN to be uses with bowel program. Patient states she used to use this at home.   6/22- will add enema daily since did at home  6/25- didn't need last 2 days  6/27- LBM overnight- doing well  6/29- says bowels finally working well 16. Insomnia: Improved with amitriptyline. It does make her very groggy during the day but she would like to continue at this time.   6/27- sleeping great 17. Leukocytosis  6/21- pt's WBC is up to 21k- was 17k- will check U/A and Cx and have PA check incision if possible to make sure not cause of infection- not on prednisone.  Is afebrile and said doesn't feel bad.   6/22- U/A is completely negative- just finished IV steroids, so likely the cause of leukocytosis- will monitor with daily CBC to make sure down trending- is pending now.   6/24- CBC down to 15.5k  6/26- WBC 13.8- due to steroid taper  6/28:  trending downward 18. Hypokalemia  6/23- will replete K+- 40 mEq x 2  6/24- K+ 4.8 now s/p repletion  6/28: K+ stable  LOS: 15 days A FACE TO FACE EVALUATION WAS PERFORMED  Finnegan Gatta 08/25/2019, 9:05 AM

## 2019-08-25 NOTE — Progress Notes (Signed)
Occupational Therapy Session Note  Patient Details  Name: Stacy Wagner MRN: [id number] Date of Birth: Jul 28, 1940  Today's Date: 08/25/2019 OT Individual Time: 1530-1600 OT Individual Time Calculation (min): 30 min    Short Term Goals: Week 2:  OT Short Term Goal 1 (Week 2): Pt will be able to pull pants down with min A in standing to prep for toileting. OT Short Term Goal 2 (Week 2): Pt will don socks with sock aid with supervision. OT Short Term Goal 3 (Week 2): Pt will perform LB dressing with Min A with use of reacher OT Short Term Goal 4 (Week 2): Pt will perform UB/LB bathing with Min A with use of AE as needed  Skilled Therapeutic Interventions/Progress Updates:    Upon entering the room, pt seated in wheelchair with family present in the room. Caregivers with several questions related to discharge tomorrow as well as HHOT recommendation and expectations. OT answered questions until caregivers with no further concerns at this time. All needs within reach upon exiting the room.   Therapy Documentation Precautions:  Precautions Precautions: Back, Fall Precaution Comments: reviewed 3/3 back precautions Required Braces or Orthoses: Spinal Brace Spinal Brace: Thoracolumbosacral orthotic, Applied in sitting position Restrictions Weight Bearing Restrictions: No General:   Vital Signs: Therapy Vitals Temp: 98.2 F (36.8 C) Temp Source: Oral Pulse Rate: 86 Resp: 16 BP: 130/60 Patient Position (if appropriate): Sitting Oxygen Therapy SpO2: 100 % O2 Device: Room Air Pain: Pain Assessment Pain Scale: 0-10 Pain Score: 2  ADL: ADL Equipment Provided: Reacher Eating: Independent Grooming: Independent Where Assessed-Grooming: Sitting at sink Upper Body Bathing: Setup Where Assessed-Upper Body Bathing: Shower Lower Body Bathing: Minimal assistance Where Assessed-Lower Body Bathing: Shower Upper Body Dressing: Setup Where Assessed-Upper Body Dressing: Chair Lower  Body Dressing: Minimal assistance Where Assessed-Lower Body Dressing: Chair Toileting: Minimal assistance Where Assessed-Toileting: Toilet, Recruitment consultant Transfer: Close supervision Toilet Transfer Method: Counselling psychologist: Engineer, technical sales Transfer: Close supervison Clinical cytogeneticist Method: Optometrist: Gaffer Baseline Vision/History: Wears glasses Perception  Perception: Within Functional Limits Praxis Praxis: Intact Exercises:   Other Treatments:     Therapy/Group: Individual Therapy  Gypsy Decant 08/25/2019, 4:20 PM

## 2019-08-25 NOTE — Progress Notes (Signed)
Patient ID: [name], female   DOB: Jun 13, 1940, 79 y.o.   MRN: [id number]   08/24/2019- HHPT/OT/CNA referral accepted by Edrick Kins.   08/25/2019- SW ordered RW and transport chair from [employer] via parachute.  *SW met with pt and family to confirm DME delivered. Concerns related to transport w/c. SW informed on [name]. Updates from therapy to order pt an 18x18 w/c. Order sent to [place].    [name], MSW, [place] Office: [phone] Cell: [phone] Fax: [phone]

## 2019-08-25 NOTE — Progress Notes (Signed)
Occupational Therapy Discharge Summary  Patient Details  Name: Stacy Wagner MRN: [id number] Date of Birth: [birth year]  Today's Date: 08/25/2019 OT Individual Time: 1005-1043 and 4098-1191 OT Individual Time Calculation (min): 38 min and 44 min   Patient has met 5 of 8 long term goals due to improved activity tolerance, improved balance, postural control and improved awareness.  Patient to discharge at [facility] Supervision- [place] level. Pt is supervision for functional transfers, set up for most UB ADLs, and CGA-Min A for LB ADLs. Patient's care partner is independent to provide the necessary physical assistance at discharge.  Pt has shown significant improvement toward OT goals, family ed and training has been completed and family is ready to provide assistance as needed. Pt also provided with cleansing aide and long handled sponge if needed.   Reasons goals not met: Pt provided with cleansing aide for BM, but did not like to use so she will continue to be Min A for hygiene with BM but able to perform hygiene for urine only. Min A for LB bathing at shower level. Pt has shown significant improvement with progress toward OT goals.   Recommendation:  Patient will benefit from ongoing skilled OT services in home health setting to continue to advance functional skills in the area of BADL and Reduce care partner burden.  Equipment: TTB, BSC  Reasons for discharge: discharge from hospital and progress toward goals, functional improvement with ADLs  Patient/family agrees with progress made and goals achieved: Yes   Skilled Interventions: Pt greeted at time of session sitting up in wheelchair with mild c/o of pain, much improved from previous sessions. Ambulated from room to apartment with supervision with RW and w/c follow, performed IADL training for home management for opening fridge, compensatory techniques for cabinets, and techniques to decrease falls in kitchen for light tasks. Pt  expressed urgency and ambulated back to room with supervision and transferred to toilet supervision, clothing management with CGA-Min A for ugency and did have BM, assistance with wiping as pt does not like cleansing aide. Able to don new brief with set up and don pants over hips with CGA-supervision. Once back in wheelchair pt with call bell in reach, alarm on, all needs met.   Session 2: Pt greeted at time of session sitting up in wheelchair, finished with her lunch with family present. Equipment had been dropped off earlier in the day, family ed with daughter how to set up [facility] and switched walker bag over to her new RW to take home. Demonstration provided for long handled sponge as well for home use to maintain back precautions. Brief functional mobility with her new RW throughout the hallway with supervision, no LOB and improved gait speed compared to previous sessions. Reviewed BUE strengthening exercises with level 2 theraband for pt to use at home for scap retractions, shoulder elevations, and rows to improve UB strength after discharge. Pt up in chair with alarm on, call bell in reach, all needs met.  OT Discharge Precautions/Restrictions  Precautions Precautions: Back;Fall Precaution Comments: reviewed 3/3 back precautions Required Braces or Orthoses: Spinal Brace Spinal Brace: Thoracolumbosacral orthotic;Applied in sitting position Restrictions Weight Bearing Restrictions: No Pain Pain Assessment Pain Scale: 0-10 Pain Score: 2  ADL ADL Eating: Independent Grooming: Independent Where Assessed-Grooming: Sitting at sink Upper Body Bathing: Setup Where Assessed-Upper Body Bathing: Chair [facility]) Lower Body Bathing: Moderate assistance Where Assessed-Lower Body Bathing: Chair Upper Body Dressing: Setup (A with orthosis) Where Assessed-Upper Body Dressing: Chair Lower Body Dressing:  Maximal assistance Where Assessed-Lower Body Dressing: Chair Toileting: Maximal assistance Where  Assessed-Toileting: Bedside Commode Toilet Transfer: Minimal assistance Toilet Transfer Method: Stand pivot Toilet Transfer Equipment: Bedside commode Vision Baseline Vision/History: No visual deficits;Wears glasses Perception  Perception: Within Functional Limits Praxis Praxis: Intact Cognition Overall Cognitive Status: Within Functional Limits for tasks assessed Arousal/Alertness: Awake/alert Orientation Level: Oriented X4 Safety/Judgment: Appears intact Sensation Sensation Light Touch: Appears Intact Coordination Gross Motor Movements are Fluid and Coordinated: Yes Fine Motor Movements are Fluid and Coordinated: Yes Motor  Motor Motor: Within Functional Limits Motor - Skilled Clinical Observations: generalized limitations due to pain Mobility  Bed Mobility Bed Mobility: Supine to Sit;Sit to Supine Rolling Right: Supervision/verbal cueing Supine to Sit: Supervision/Verbal cueing Sit to Supine: Supervision/Verbal cueing Transfers Sit to Stand: Supervision/Verbal cueing Stand to Sit: Supervision/Verbal cueing  Trunk/Postural Assessment  Cervical Assessment Cervical Assessment: Exceptions to [facility] (FWD head) Thoracic Assessment Thoracic Assessment: Exceptions to [facility] (rounded shoulders, but improved) Lumbar Assessment Lumbar Assessment: Exceptions to [facility] Postural Control Postural Control: Within Functional Limits  Balance Balance Balance Assessed: Yes Static Sitting Balance Static Sitting - Level of Assistance: 7: Independent Dynamic Sitting Balance Dynamic Sitting - Level of Assistance: 7: Independent Static Standing Balance Static Standing - Level of Assistance: 7: Independent Dynamic Standing Balance Dynamic Standing - Level of Assistance: 5: Stand by assistance Extremity/Trunk Assessment RUE Assessment RUE Assessment: Within Functional Limits     Stacy Wagner 08/25/2019, 12:24 PM

## 2019-08-26 MED ORDER — AMITRIPTYLINE HCL 10 MG PO TABS: 10.0000 mg | ORAL_TABLET | Freq: Every day | ORAL | 0 refills | Status: DC

## 2019-08-26 MED ORDER — CLONAZEPAM 0.25 MG PO TBDP: 0.2500 mg | ORAL_TABLET | Freq: Two times a day (BID) | ORAL | 0 refills | Status: DC | PRN

## 2019-08-26 MED ORDER — METHOCARBAMOL 750 MG PO TABS: 750.0000 mg | ORAL_TABLET | Freq: Four times a day (QID) | ORAL | 0 refills | Status: DC | PRN

## 2019-08-26 MED ORDER — TRIAMTERENE-HCTZ 75-50 MG PO TABS: 1.0000 | ORAL_TABLET | Freq: Every day | ORAL | 1 refills | Status: DC

## 2019-08-26 MED ORDER — OXYCODONE HCL 5 MG PO TABS: 5.0000 mg | ORAL_TABLET | ORAL | 0 refills | Status: DC | PRN

## 2019-08-26 MED ORDER — POLYETHYLENE GLYCOL 3350 17 G PO PACK: 17.0000 g | PACK | Freq: Every day | ORAL | 0 refills | Status: DC

## 2019-08-26 MED ORDER — TRAMADOL HCL 50 MG PO TABS: 50.0000 mg | ORAL_TABLET | Freq: Three times a day (TID) | ORAL | 0 refills | Status: DC

## 2019-08-26 MED ORDER — GABAPENTIN 300 MG PO CAPS: 600.0000 mg | ORAL_CAPSULE | Freq: Three times a day (TID) | ORAL | 1 refills | Status: DC

## 2019-08-26 NOTE — Plan of Care (Signed)
Problem: Consults Goal: RH GENERAL PATIENT EDUCATION Description: Patient will be aware of their progress and know their expected goals and expected discharge date.    Outcome: Completed/Met Goal: Skin Care Protocol Initiated - if Braden Score 18 or less Description: If consults are not indicated, leave blank or document N/A Outcome: Completed/Met Goal: Nutrition Consult-if indicated Outcome: Completed/Met   Problem: RH BOWEL ELIMINATION Goal: RH STG MANAGE BOWEL WITH ASSISTANCE Description: STG Manage Bowel with mod Assistance. Outcome: Completed/Met Goal: RH STG MANAGE BOWEL W/MEDICATION W/ASSISTANCE Description: STG Manage Bowel with Medication with mod Assistance. Outcome: Completed/Met   Problem: RH BLADDER ELIMINATION Goal: RH STG MANAGE BLADDER WITH ASSISTANCE Description: Patient will be continent of bladder prior to discharge and able to empty bladder when urinating.  Outcome: Completed/Met Goal: RH STG MANAGE BLADDER WITH MEDICATION WITH ASSISTANCE Description: STG Manage Bladder With Medication With min Assistance. Outcome: Completed/Met   Problem: RH SKIN INTEGRITY Goal: RH STG SKIN FREE OF INFECTION/BREAKDOWN Description: Patient will remain free of skin tears and no new signs or symptoms of infection will appear.  Outcome: Completed/Met Goal: RH STG MAINTAIN SKIN INTEGRITY WITH ASSISTANCE Description: STG Maintain Skin Integrity With min Assistance. Outcome: Completed/Met Goal: RH STG ABLE TO PERFORM INCISION/WOUND CARE W/ASSISTANCE Description: STG Able To Perform Incision/Wound Care With min Assistance. Outcome: Completed/Met   Problem: RH SAFETY Goal: RH STG ADHERE TO SAFETY PRECAUTIONS W/ASSISTANCE/DEVICE Description: Patient will refrain from falls and ask for assistance as needed.  Outcome: Completed/Met Goal: RH STG DECREASED RISK OF FALL WITH ASSISTANCE Description: STG Decreased Risk of Fall With mod I Assistance. Outcome: Completed/Met    Problem: RH PAIN MANAGEMENT Goal: RH STG PAIN MANAGED AT OR BELOW PT'S PAIN GOAL Description: Less than 3 out of 10 Outcome: Completed/Met   Problem: RH KNOWLEDGE DEFICIT GENERAL Goal: RH STG INCREASE KNOWLEDGE OF SELF CARE AFTER HOSPITALIZATION Description: Patient will know how to follow up with appointments and aware of how to take care of herself and prevent any unwanted hospital readmissions.  Outcome: Completed/Met

## 2019-08-26 NOTE — Progress Notes (Signed)
Patient ID: Stacy Wagner, female   DOB: June 01, 1940, 79 y.o.   MRN: [id number]   SW met with pt in room at bedside to review d/c. SW confirmed w/c delivered to pt room.   [name], MSW, [place] Office: [phone] Cell: [phone] Fax: [phone]

## 2019-08-26 NOTE — Progress Notes (Signed)
[place] PHYSICAL MEDICINE & REHABILITATION PROGRESS NOTE   Subjective/Complaints:  Pt reports slept after received pain meds last night- was "bad night". Usually alternating.  Plans for d/c today.   ROS:   Pt denies SOB, abd pain, CP, N/V/C/D, and vision changes   Objective:   No results found. Recent Labs    08/24/19 0542  WBC 11.8*  HGB 10.2*  HCT 31.6*  PLT 328   Recent Labs    08/24/19 0542  NA 139  K 4.6  CL 100  CO2 31  GLUCOSE 115*  BUN 23  CREATININE 0.82  CALCIUM 8.5*    Intake/Output Summary (Last 24 hours) at 08/26/2019 0750 Last data filed at 08/25/2019 1715 Gross per 24 hour  Intake 300 ml  Output 1 ml  Net 299 ml     Physical Exam: Vital Signs Blood pressure 132/65, pulse 73, temperature 98.2 F (36.8 C), temperature source Oral, resp. rate 16, height 5\' 8"  (1.727 m), weight 72 kg, SpO2 100 %. General: awake, alert, appropriate, HOH, NAD HEENT: conjugate gaze Heart: RRR Pulm: CTA B/L- no W/R/R- good air movement Abdomen: Soft, NT, ND, (+)BS   Extremities: No clubbing, cyanosis, or edema. Pulses are 2+ Skin: back incision- glue in place- localized erythema and slightly warm from laying on it- but not hot, not really red, no drainage Neuro: Ox3  Cranial nerves 2-12 are intact. Sensory exam is normal. Fine motor coordination is intact. No tremors. Motor function is grossly 5/5 in bilateral upper extremities. LLE grossly 4+/5. RLE with HF and KE at 2/5 and DF/PF 5-/5 Still not able to lift leg off bed- but says can when walking Psych: smiling/appropriate  Assessment/Plan: 1. Functional deficits secondary to impaired mobility and ADLs secondary to L5-S1 anterior lumbar interbody fusion and L1-L5 anterolateral lumbar interbody fusion for stage 1 scoliosis, complicated by dysesthesias in RLE, which require 3+ hours per day of interdisciplinary therapy in a comprehensive inpatient rehab setting.  Physiatrist is providing close team supervision  and 24 hour management of active medical problems listed below.  Physiatrist and rehab team continue to assess barriers to discharge/monitor patient progress toward functional and medical goals  Care Tool:  Bathing    Body parts bathed by patient: Face, Right upper leg, Left upper leg, Right arm, Left arm, Chest, Abdomen, Front perineal area   Body parts bathed by helper: Right lower leg, Left lower leg, Buttocks     Bathing assist Assist Level: Minimal Assistance - Patient > 75%     Upper Body Dressing/Undressing Upper body dressing   What is the patient wearing?: Button up shirt, Orthosis Orthosis activity level: Performed by patient  Upper body assist Assist Level: Set up assist    Lower Body Dressing/Undressing Lower body dressing      What is the patient wearing?: Pants, Underwear/pull up     Lower body assist Assist for lower body dressing: Minimal Assistance - Patient > 75%     Toileting Toileting    Toileting assist Assist for toileting: Minimal Assistance - Patient > 75%     Transfers Chair/bed transfer  Transfers assist     Chair/bed transfer assist level: Supervision/Verbal cueing Chair/bed transfer assistive device: Programmer, multimedia   Ambulation assist      Assist level: Supervision/Verbal cueing Assistive device: Walker-rolling Max distance: 300'   Walk 10 feet activity   Assist     Assist level: Supervision/Verbal cueing Assistive device: Walker-rolling   Walk 50 feet activity  Assist Walk 50 feet with 2 turns activity did not occur: Safety/medical concerns  Assist level: Supervision/Verbal cueing Assistive device: Walker-rolling    Walk 150 feet activity   Assist Walk 150 feet activity did not occur: Safety/medical concerns  Assist level: Supervision/Verbal cueing Assistive device: Walker-rolling    Walk 10 feet on uneven surface  activity   Assist     Assist level: Supervision/Verbal  cueing Assistive device: Walker-rolling   Wheelchair     Assist Will patient use wheelchair at discharge?: No             Wheelchair 50 feet with 2 turns activity    Assist            Wheelchair 150 feet activity     Assist          Blood pressure 132/65, pulse 73, temperature 98.2 F (36.8 C), temperature source Oral, resp. rate 16, height 5\' 8"  (1.727 m), weight 72 kg, SpO2 100 %.  Medical Problem List and Plan: 1.  Impaired mobility and ADLs secondary to L5-S1 anterior lumbar interbody fusion and L1-L5 anterolateral lumbar interbody fusion for stage 1 scoliosis, complicated by dysesthesias in RLE.             -patient may shower but incision must be covered  6/23- Wearing TLSO             -ELOS/Goals: modI 12-16 days  -Continue CIR therapies. 2.  Antithrombotics: -DVT/anticoagulation:  Pharmaceutical: Lovenox- d/ced since patient ambulated 300 feet 6/17 3. Pain Management: Continue Oxycodone prn--d/c hydrodocone/hydromorphone. On valium and robaxin prn for muscle spasms.   6/20: Continues to have painful right lower extremity dysesthesias. On Tramadol and Gabapentin TID and has prn Oxycodone and Methocarbamol which she has been using.   6/26- educated on steroids and pain being expected after surgery- she keeps complaining about it, because she says it's bad and she's scared "something wrong"- explained we don't think so. Dr Vertell Limber agrees   6/29- pain doing better, per pt- heading in right direction.  6/30- d/c today with 7 days of meds 4. Mood: LCSW to follow for evaluation and support. Anxious at times. Klonopin 0.25mg  prn ordered.              -antipsychotic agents: N/A 5. Neuropsych: This patient is capable of making decisions on her own behalf. 6. Skin/Wound Care: Honeycomb dressing along spinal incision without drainage. Incision sites in right iliac crest with skin glue.  6/24- d/c honeycomb dressing  Continue to monitor. Dressing removed 6/20.  7.  Fluids/Electrolytes/Nutrition: Monitor I/O.  8. HTN: Monitor BP tid--continue Maxzide. Labile- continue to monitor.  9. Neuropathy: Gabapentin increased to 400 mg tid on 06/14. Continues on 4mg  IV decadron every 8 hrs.              6/14: Gabapentin was increased to 400mg  TID as worst pain is currently the neuropathic pain in RLE. If well tolerated and pain persists, can consider increasing dose further.   6/21- increased gabapentin to 600 mg TID  6/23- a little sleepy- but pain better today.   6/25- on steroid taper- helping some  6/30- con't steroid taper 10. Hypothyroid: Continue supplement.  11. Thrush: Continue Nystatin swish and swallow. Improving.  12. Leukocytosis: 2/2 steroids. 17.3 on 6/15. Continue to monitor weekly.  13. Blood loss anemia: 10.4 on 6/15. Continue to monitor weekly. 14. Hypocalcemia: Ca 8.5 on 6/15. Started on calcium supplement. Monitor weekly.   15. Constipation: Receiving dig stim HS without  much benefit. Does receive benefit from soaps suds enema. Will order daily PRN to be uses with bowel program. Patient states she used to use this at home.   6/22- will add enema daily since did at home  6/25- didn't need last 2 days  6/27- LBM overnight- doing well  6/29- says bowels finally working well 16. Insomnia: Improved with amitriptyline. It does make her very groggy during the day but she would like to continue at this time.   6/27- sleeping great 17. Leukocytosis  6/21- pt's WBC is up to 21k- was 17k- will check U/A and Cx and have PA check incision if possible to make sure not cause of infection- not on prednisone.  Is afebrile and said doesn't feel bad.   6/22- U/A is completely negative- just finished IV steroids, so likely the cause of leukocytosis- will monitor with daily CBC to make sure down trending- is pending now.   6/24- CBC down to 15.5k  6/26- WBC 13.8- due to steroid taper  6/28: trending downward 18. Hypokalemia  6/23- will replete K+- 40 mEq x  2  6/24- K+ 4.8 now s/p repletion  6/28: K+ stable 19. Dispo  6/30- d/c today  LOS: 16 days A FACE TO FACE EVALUATION WAS PERFORMED  Allante Whitmire 08/26/2019, 7:50 AM

## 2019-08-26 NOTE — Progress Notes (Signed)
Patient discharged to home with husband and daughter via car.

## 2019-08-26 NOTE — Progress Notes (Signed)
Inpatient Rehabilitation Care Coordinator  Discharge Note  The overall goal for the admission was met for:   Discharge location: Yes. D/c to home with husband and dtr. Dtr to help with caring for mother.   Length of Stay: Yes. 15 days.  Discharge activity level: Yes. Supervision.   Home/community participation: Yes. Limited.  Services provided included: MD, RD, PT, OT, RN, CM, TR, Pharmacy, Neuropsych and SW  Financial Services: Medicare and [place]  Follow-up services arranged: Home Health: [facility] for PT/OT/SNA, DME: 3in1 BSC, TTB, RW, and w/c and Patient/Family request agency HH: [name], DME: N/A  Comments (or additional information): contact pt dtr [name] [phone]  Patient/Family verbalized understanding of follow-up arrangements: Yes  Individual responsible for coordination of the follow-up plan: Pt will have assistance with coordinating care needs.   Confirmed correct DME delivered: Rana Snare 08/26/2019    Rana Snare

## 2019-08-26 NOTE — Progress Notes (Signed)
Subjective: Patient reports making progress; ready to go home  Objective: Vital signs in last 24 hours: Temp:  [98.1 F (36.7 C)-98.2 F (36.8 C)] 98.2 F (36.8 C) (06/30 0431) Pulse Rate:  [73-90] 73 (06/30 0431) Resp:  [16] 16 (06/30 0431) BP: (130-134)/(60-65) 132/65 (06/30 0431) SpO2:  [100 %] 100 % (06/30 0431)  Intake/Output from previous day: 06/29 0701 - 06/30 0700 In: 300 [P.O.:300] Out: 1 [Stool:1] Intake/Output this shift: No intake/output data recorded.  Physical Exam: Getting up from bed  Lab Results: Recent Labs    08/24/19 0542  WBC 11.8*  HGB 10.2*  HCT 31.6*  PLT 328   BMET Recent Labs    08/24/19 0542  NA 139  K 4.6  CL 100  CO2 31  GLUCOSE 115*  BUN 23  CREATININE 0.82  CALCIUM 8.5*    Studies/Results: No results found.  Assessment/Plan: Patient says leg pain has gradually been improving; she is walking better.  OK for discharge home with plan to follow up with me in my office in 3 weeks.    LOS: 16 days    [name], MD 08/26/2019, 8:31 AM

## 2019-08-27 DIAGNOSIS — G4700 Insomnia, unspecified: Secondary | ICD-10-CM

## 2019-08-27 DIAGNOSIS — D72829 Elevated white blood cell count, unspecified: Secondary | ICD-10-CM

## 2019-08-27 DIAGNOSIS — K592 Neurogenic bowel, not elsewhere classified: Secondary | ICD-10-CM

## 2019-08-27 DIAGNOSIS — R208 Other disturbances of skin sensation: Secondary | ICD-10-CM

## 2019-08-27 DIAGNOSIS — D62 Acute posthemorrhagic anemia: Secondary | ICD-10-CM

## 2019-08-29 DIAGNOSIS — M4126 Other idiopathic scoliosis, lumbar region: Secondary | ICD-10-CM | POA: Diagnosis not present

## 2019-08-29 DIAGNOSIS — H9193 Unspecified hearing loss, bilateral: Secondary | ICD-10-CM | POA: Diagnosis not present

## 2019-08-29 DIAGNOSIS — Z85810 Personal history of malignant neoplasm of tongue: Secondary | ICD-10-CM | POA: Diagnosis not present

## 2019-08-29 DIAGNOSIS — M4726 Other spondylosis with radiculopathy, lumbar region: Secondary | ICD-10-CM | POA: Diagnosis not present

## 2019-08-29 DIAGNOSIS — G8929 Other chronic pain: Secondary | ICD-10-CM | POA: Diagnosis not present

## 2019-08-29 DIAGNOSIS — Z4789 Encounter for other orthopedic aftercare: Secondary | ICD-10-CM | POA: Diagnosis not present

## 2019-08-29 DIAGNOSIS — G629 Polyneuropathy, unspecified: Secondary | ICD-10-CM | POA: Diagnosis not present

## 2019-08-29 DIAGNOSIS — G43909 Migraine, unspecified, not intractable, without status migrainosus: Secondary | ICD-10-CM | POA: Diagnosis not present

## 2019-08-29 DIAGNOSIS — Z9181 History of falling: Secondary | ICD-10-CM | POA: Diagnosis not present

## 2019-08-29 DIAGNOSIS — Z981 Arthrodesis status: Secondary | ICD-10-CM | POA: Diagnosis not present

## 2019-08-29 DIAGNOSIS — E039 Hypothyroidism, unspecified: Secondary | ICD-10-CM | POA: Diagnosis not present

## 2019-08-29 DIAGNOSIS — I10 Essential (primary) hypertension: Secondary | ICD-10-CM | POA: Diagnosis not present

## 2019-08-29 DIAGNOSIS — M48062 Spinal stenosis, lumbar region with neurogenic claudication: Secondary | ICD-10-CM | POA: Diagnosis not present

## 2019-08-29 DIAGNOSIS — M5136 Other intervertebral disc degeneration, lumbar region: Secondary | ICD-10-CM | POA: Diagnosis not present

## 2019-08-29 DIAGNOSIS — Z85828 Personal history of other malignant neoplasm of skin: Secondary | ICD-10-CM | POA: Diagnosis not present

## 2019-09-01 DIAGNOSIS — M4726 Other spondylosis with radiculopathy, lumbar region: Secondary | ICD-10-CM | POA: Diagnosis not present

## 2019-09-01 DIAGNOSIS — Z4789 Encounter for other orthopedic aftercare: Secondary | ICD-10-CM | POA: Diagnosis not present

## 2019-09-01 DIAGNOSIS — M5136 Other intervertebral disc degeneration, lumbar region: Secondary | ICD-10-CM | POA: Diagnosis not present

## 2019-09-01 DIAGNOSIS — M4126 Other idiopathic scoliosis, lumbar region: Secondary | ICD-10-CM | POA: Diagnosis not present

## 2019-09-01 DIAGNOSIS — M48062 Spinal stenosis, lumbar region with neurogenic claudication: Secondary | ICD-10-CM | POA: Diagnosis not present

## 2019-09-01 DIAGNOSIS — G8929 Other chronic pain: Secondary | ICD-10-CM | POA: Diagnosis not present

## 2019-09-02 DIAGNOSIS — Z4789 Encounter for other orthopedic aftercare: Secondary | ICD-10-CM | POA: Diagnosis not present

## 2019-09-02 DIAGNOSIS — M4726 Other spondylosis with radiculopathy, lumbar region: Secondary | ICD-10-CM | POA: Diagnosis not present

## 2019-09-02 DIAGNOSIS — G8929 Other chronic pain: Secondary | ICD-10-CM | POA: Diagnosis not present

## 2019-09-02 DIAGNOSIS — M48062 Spinal stenosis, lumbar region with neurogenic claudication: Secondary | ICD-10-CM | POA: Diagnosis not present

## 2019-09-02 DIAGNOSIS — M4126 Other idiopathic scoliosis, lumbar region: Secondary | ICD-10-CM | POA: Diagnosis not present

## 2019-09-02 DIAGNOSIS — M5136 Other intervertebral disc degeneration, lumbar region: Secondary | ICD-10-CM | POA: Diagnosis not present

## 2019-09-03 DIAGNOSIS — M5136 Other intervertebral disc degeneration, lumbar region: Secondary | ICD-10-CM | POA: Diagnosis not present

## 2019-09-03 DIAGNOSIS — M4726 Other spondylosis with radiculopathy, lumbar region: Secondary | ICD-10-CM | POA: Diagnosis not present

## 2019-09-03 DIAGNOSIS — Z4789 Encounter for other orthopedic aftercare: Secondary | ICD-10-CM | POA: Diagnosis not present

## 2019-09-03 DIAGNOSIS — M4126 Other idiopathic scoliosis, lumbar region: Secondary | ICD-10-CM | POA: Diagnosis not present

## 2019-09-03 DIAGNOSIS — G8929 Other chronic pain: Secondary | ICD-10-CM | POA: Diagnosis not present

## 2019-09-03 DIAGNOSIS — M48062 Spinal stenosis, lumbar region with neurogenic claudication: Secondary | ICD-10-CM | POA: Diagnosis not present

## 2019-09-08 ENCOUNTER — Encounter: Payer: Medicare Other | Admitting: Registered Nurse

## 2019-09-08 DIAGNOSIS — Z4789 Encounter for other orthopedic aftercare: Secondary | ICD-10-CM | POA: Diagnosis not present

## 2019-09-08 DIAGNOSIS — G8929 Other chronic pain: Secondary | ICD-10-CM | POA: Diagnosis not present

## 2019-09-08 DIAGNOSIS — M48062 Spinal stenosis, lumbar region with neurogenic claudication: Secondary | ICD-10-CM | POA: Diagnosis not present

## 2019-09-08 DIAGNOSIS — M5136 Other intervertebral disc degeneration, lumbar region: Secondary | ICD-10-CM | POA: Diagnosis not present

## 2019-09-08 DIAGNOSIS — M4726 Other spondylosis with radiculopathy, lumbar region: Secondary | ICD-10-CM | POA: Diagnosis not present

## 2019-09-08 DIAGNOSIS — M4126 Other idiopathic scoliosis, lumbar region: Secondary | ICD-10-CM | POA: Diagnosis not present

## 2019-09-09 DIAGNOSIS — M48062 Spinal stenosis, lumbar region with neurogenic claudication: Secondary | ICD-10-CM | POA: Diagnosis not present

## 2019-09-09 DIAGNOSIS — G8929 Other chronic pain: Secondary | ICD-10-CM | POA: Diagnosis not present

## 2019-09-09 DIAGNOSIS — M4126 Other idiopathic scoliosis, lumbar region: Secondary | ICD-10-CM | POA: Diagnosis not present

## 2019-09-09 DIAGNOSIS — M4726 Other spondylosis with radiculopathy, lumbar region: Secondary | ICD-10-CM | POA: Diagnosis not present

## 2019-09-09 DIAGNOSIS — Z4789 Encounter for other orthopedic aftercare: Secondary | ICD-10-CM | POA: Diagnosis not present

## 2019-09-09 DIAGNOSIS — M5136 Other intervertebral disc degeneration, lumbar region: Secondary | ICD-10-CM | POA: Diagnosis not present

## 2019-09-10 DIAGNOSIS — M4726 Other spondylosis with radiculopathy, lumbar region: Secondary | ICD-10-CM | POA: Diagnosis not present

## 2019-09-10 DIAGNOSIS — M4126 Other idiopathic scoliosis, lumbar region: Secondary | ICD-10-CM | POA: Diagnosis not present

## 2019-09-10 DIAGNOSIS — M5136 Other intervertebral disc degeneration, lumbar region: Secondary | ICD-10-CM | POA: Diagnosis not present

## 2019-09-10 DIAGNOSIS — M48062 Spinal stenosis, lumbar region with neurogenic claudication: Secondary | ICD-10-CM | POA: Diagnosis not present

## 2019-09-10 DIAGNOSIS — G8929 Other chronic pain: Secondary | ICD-10-CM | POA: Diagnosis not present

## 2019-09-10 DIAGNOSIS — Z4789 Encounter for other orthopedic aftercare: Secondary | ICD-10-CM | POA: Diagnosis not present

## 2019-09-11 DIAGNOSIS — M4126 Other idiopathic scoliosis, lumbar region: Secondary | ICD-10-CM | POA: Diagnosis not present

## 2019-09-11 DIAGNOSIS — Z4789 Encounter for other orthopedic aftercare: Secondary | ICD-10-CM | POA: Diagnosis not present

## 2019-09-11 DIAGNOSIS — M48062 Spinal stenosis, lumbar region with neurogenic claudication: Secondary | ICD-10-CM | POA: Diagnosis not present

## 2019-09-11 DIAGNOSIS — M5136 Other intervertebral disc degeneration, lumbar region: Secondary | ICD-10-CM | POA: Diagnosis not present

## 2019-09-11 DIAGNOSIS — G8929 Other chronic pain: Secondary | ICD-10-CM | POA: Diagnosis not present

## 2019-09-11 DIAGNOSIS — M4726 Other spondylosis with radiculopathy, lumbar region: Secondary | ICD-10-CM | POA: Diagnosis not present

## 2019-09-15 DIAGNOSIS — Z4789 Encounter for other orthopedic aftercare: Secondary | ICD-10-CM | POA: Diagnosis not present

## 2019-09-15 DIAGNOSIS — M48062 Spinal stenosis, lumbar region with neurogenic claudication: Secondary | ICD-10-CM | POA: Diagnosis not present

## 2019-09-15 DIAGNOSIS — M4726 Other spondylosis with radiculopathy, lumbar region: Secondary | ICD-10-CM | POA: Diagnosis not present

## 2019-09-15 DIAGNOSIS — M5136 Other intervertebral disc degeneration, lumbar region: Secondary | ICD-10-CM | POA: Diagnosis not present

## 2019-09-15 DIAGNOSIS — G8929 Other chronic pain: Secondary | ICD-10-CM | POA: Diagnosis not present

## 2019-09-15 DIAGNOSIS — M4126 Other idiopathic scoliosis, lumbar region: Secondary | ICD-10-CM | POA: Diagnosis not present

## 2019-09-16 DIAGNOSIS — M5136 Other intervertebral disc degeneration, lumbar region: Secondary | ICD-10-CM | POA: Diagnosis not present

## 2019-09-16 DIAGNOSIS — Z4789 Encounter for other orthopedic aftercare: Secondary | ICD-10-CM | POA: Diagnosis not present

## 2019-09-16 DIAGNOSIS — M4126 Other idiopathic scoliosis, lumbar region: Secondary | ICD-10-CM | POA: Diagnosis not present

## 2019-09-16 DIAGNOSIS — M4726 Other spondylosis with radiculopathy, lumbar region: Secondary | ICD-10-CM | POA: Diagnosis not present

## 2019-09-16 DIAGNOSIS — M48062 Spinal stenosis, lumbar region with neurogenic claudication: Secondary | ICD-10-CM | POA: Diagnosis not present

## 2019-09-16 DIAGNOSIS — G8929 Other chronic pain: Secondary | ICD-10-CM | POA: Diagnosis not present

## 2019-09-17 ENCOUNTER — Telehealth: Payer: Self-pay | Admitting: Family Medicine

## 2019-09-17 DIAGNOSIS — M48062 Spinal stenosis, lumbar region with neurogenic claudication: Secondary | ICD-10-CM | POA: Diagnosis not present

## 2019-09-17 DIAGNOSIS — Z4789 Encounter for other orthopedic aftercare: Secondary | ICD-10-CM | POA: Diagnosis not present

## 2019-09-17 DIAGNOSIS — M4126 Other idiopathic scoliosis, lumbar region: Secondary | ICD-10-CM | POA: Diagnosis not present

## 2019-09-17 DIAGNOSIS — G8929 Other chronic pain: Secondary | ICD-10-CM | POA: Diagnosis not present

## 2019-09-17 DIAGNOSIS — M4726 Other spondylosis with radiculopathy, lumbar region: Secondary | ICD-10-CM | POA: Diagnosis not present

## 2019-09-17 DIAGNOSIS — M5136 Other intervertebral disc degeneration, lumbar region: Secondary | ICD-10-CM | POA: Diagnosis not present

## 2019-09-17 NOTE — Telephone Encounter (Signed)
Stacy Wagner called to let Dr. Elease Hashimoto know that the patient had a double back surgery on June 10 and June 11  She is going to need therapy and Alvis Lemmings will be in contact will [place]  Dr. Vertell Limber did the surgery  RN - Phylis Bougie [phone]

## 2019-09-18 NOTE — Telephone Encounter (Signed)
noted 

## 2019-09-18 NOTE — Telephone Encounter (Signed)
fyi

## 2019-09-22 DIAGNOSIS — G8929 Other chronic pain: Secondary | ICD-10-CM | POA: Diagnosis not present

## 2019-09-22 DIAGNOSIS — M4726 Other spondylosis with radiculopathy, lumbar region: Secondary | ICD-10-CM | POA: Diagnosis not present

## 2019-09-22 DIAGNOSIS — M5136 Other intervertebral disc degeneration, lumbar region: Secondary | ICD-10-CM | POA: Diagnosis not present

## 2019-09-22 DIAGNOSIS — Z4789 Encounter for other orthopedic aftercare: Secondary | ICD-10-CM | POA: Diagnosis not present

## 2019-09-22 DIAGNOSIS — M4126 Other idiopathic scoliosis, lumbar region: Secondary | ICD-10-CM | POA: Diagnosis not present

## 2019-09-22 DIAGNOSIS — M48062 Spinal stenosis, lumbar region with neurogenic claudication: Secondary | ICD-10-CM | POA: Diagnosis not present

## 2019-09-28 DIAGNOSIS — Z4789 Encounter for other orthopedic aftercare: Secondary | ICD-10-CM | POA: Diagnosis not present

## 2019-09-28 DIAGNOSIS — G8929 Other chronic pain: Secondary | ICD-10-CM | POA: Diagnosis not present

## 2019-09-28 DIAGNOSIS — M4726 Other spondylosis with radiculopathy, lumbar region: Secondary | ICD-10-CM | POA: Diagnosis not present

## 2019-09-28 DIAGNOSIS — M48062 Spinal stenosis, lumbar region with neurogenic claudication: Secondary | ICD-10-CM | POA: Diagnosis not present

## 2019-09-28 DIAGNOSIS — Z85828 Personal history of other malignant neoplasm of skin: Secondary | ICD-10-CM | POA: Diagnosis not present

## 2019-09-28 DIAGNOSIS — E039 Hypothyroidism, unspecified: Secondary | ICD-10-CM | POA: Diagnosis not present

## 2019-09-28 DIAGNOSIS — I10 Essential (primary) hypertension: Secondary | ICD-10-CM | POA: Diagnosis not present

## 2019-09-28 DIAGNOSIS — Z9181 History of falling: Secondary | ICD-10-CM | POA: Diagnosis not present

## 2019-09-28 DIAGNOSIS — Z981 Arthrodesis status: Secondary | ICD-10-CM | POA: Diagnosis not present

## 2019-09-28 DIAGNOSIS — M4126 Other idiopathic scoliosis, lumbar region: Secondary | ICD-10-CM | POA: Diagnosis not present

## 2019-09-28 DIAGNOSIS — G629 Polyneuropathy, unspecified: Secondary | ICD-10-CM | POA: Diagnosis not present

## 2019-09-28 DIAGNOSIS — Z85810 Personal history of malignant neoplasm of tongue: Secondary | ICD-10-CM | POA: Diagnosis not present

## 2019-09-28 DIAGNOSIS — H9193 Unspecified hearing loss, bilateral: Secondary | ICD-10-CM | POA: Diagnosis not present

## 2019-09-28 DIAGNOSIS — M5136 Other intervertebral disc degeneration, lumbar region: Secondary | ICD-10-CM | POA: Diagnosis not present

## 2019-09-28 DIAGNOSIS — G43909 Migraine, unspecified, not intractable, without status migrainosus: Secondary | ICD-10-CM | POA: Diagnosis not present

## 2019-10-09 DIAGNOSIS — Z4789 Encounter for other orthopedic aftercare: Secondary | ICD-10-CM | POA: Diagnosis not present

## 2019-10-09 DIAGNOSIS — M4126 Other idiopathic scoliosis, lumbar region: Secondary | ICD-10-CM | POA: Diagnosis not present

## 2019-10-09 DIAGNOSIS — M48062 Spinal stenosis, lumbar region with neurogenic claudication: Secondary | ICD-10-CM | POA: Diagnosis not present

## 2019-10-09 DIAGNOSIS — M4726 Other spondylosis with radiculopathy, lumbar region: Secondary | ICD-10-CM | POA: Diagnosis not present

## 2019-10-09 DIAGNOSIS — G8929 Other chronic pain: Secondary | ICD-10-CM | POA: Diagnosis not present

## 2019-10-09 DIAGNOSIS — M5136 Other intervertebral disc degeneration, lumbar region: Secondary | ICD-10-CM | POA: Diagnosis not present

## 2019-10-15 DIAGNOSIS — M4126 Other idiopathic scoliosis, lumbar region: Secondary | ICD-10-CM | POA: Diagnosis not present

## 2019-10-15 DIAGNOSIS — M5136 Other intervertebral disc degeneration, lumbar region: Secondary | ICD-10-CM | POA: Diagnosis not present

## 2019-10-15 DIAGNOSIS — G8929 Other chronic pain: Secondary | ICD-10-CM | POA: Diagnosis not present

## 2019-10-15 DIAGNOSIS — M48062 Spinal stenosis, lumbar region with neurogenic claudication: Secondary | ICD-10-CM | POA: Diagnosis not present

## 2019-10-15 DIAGNOSIS — Z4789 Encounter for other orthopedic aftercare: Secondary | ICD-10-CM | POA: Diagnosis not present

## 2019-10-15 DIAGNOSIS — M4726 Other spondylosis with radiculopathy, lumbar region: Secondary | ICD-10-CM | POA: Diagnosis not present

## 2019-10-22 DIAGNOSIS — Z4789 Encounter for other orthopedic aftercare: Secondary | ICD-10-CM | POA: Diagnosis not present

## 2019-10-22 DIAGNOSIS — M4726 Other spondylosis with radiculopathy, lumbar region: Secondary | ICD-10-CM | POA: Diagnosis not present

## 2019-10-22 DIAGNOSIS — M4126 Other idiopathic scoliosis, lumbar region: Secondary | ICD-10-CM | POA: Diagnosis not present

## 2019-10-22 DIAGNOSIS — G8929 Other chronic pain: Secondary | ICD-10-CM | POA: Diagnosis not present

## 2019-10-22 DIAGNOSIS — M48062 Spinal stenosis, lumbar region with neurogenic claudication: Secondary | ICD-10-CM | POA: Diagnosis not present

## 2019-10-22 DIAGNOSIS — M5136 Other intervertebral disc degeneration, lumbar region: Secondary | ICD-10-CM | POA: Diagnosis not present

## 2019-11-09 DIAGNOSIS — M545 Low back pain: Secondary | ICD-10-CM | POA: Diagnosis not present

## 2019-11-09 DIAGNOSIS — M4120 Other idiopathic scoliosis, site unspecified: Secondary | ICD-10-CM | POA: Diagnosis not present

## 2019-11-09 DIAGNOSIS — M5416 Radiculopathy, lumbar region: Secondary | ICD-10-CM | POA: Diagnosis not present

## 2019-11-09 DIAGNOSIS — M438X9 Other specified deforming dorsopathies, site unspecified: Secondary | ICD-10-CM | POA: Diagnosis not present

## 2019-11-09 DIAGNOSIS — M48062 Spinal stenosis, lumbar region with neurogenic claudication: Secondary | ICD-10-CM | POA: Diagnosis not present

## 2019-11-10 DIAGNOSIS — M2560 Stiffness of unspecified joint, not elsewhere classified: Secondary | ICD-10-CM | POA: Diagnosis not present

## 2019-11-10 DIAGNOSIS — M545 Low back pain: Secondary | ICD-10-CM | POA: Diagnosis not present

## 2019-11-10 DIAGNOSIS — M5441 Lumbago with sciatica, right side: Secondary | ICD-10-CM | POA: Diagnosis not present

## 2019-11-10 DIAGNOSIS — M5416 Radiculopathy, lumbar region: Secondary | ICD-10-CM | POA: Diagnosis not present

## 2019-11-12 DIAGNOSIS — M2560 Stiffness of unspecified joint, not elsewhere classified: Secondary | ICD-10-CM | POA: Diagnosis not present

## 2019-11-12 DIAGNOSIS — M5441 Lumbago with sciatica, right side: Secondary | ICD-10-CM | POA: Diagnosis not present

## 2019-11-12 DIAGNOSIS — M545 Low back pain: Secondary | ICD-10-CM | POA: Diagnosis not present

## 2019-11-12 DIAGNOSIS — M5416 Radiculopathy, lumbar region: Secondary | ICD-10-CM | POA: Diagnosis not present

## 2019-11-16 DIAGNOSIS — Z1231 Encounter for screening mammogram for malignant neoplasm of breast: Secondary | ICD-10-CM | POA: Diagnosis not present

## 2019-11-16 LAB — HM MAMMOGRAPHY

## 2019-11-18 ENCOUNTER — Encounter: Payer: Self-pay | Admitting: Family Medicine

## 2019-11-18 DIAGNOSIS — M2560 Stiffness of unspecified joint, not elsewhere classified: Secondary | ICD-10-CM | POA: Diagnosis not present

## 2019-11-18 DIAGNOSIS — M5441 Lumbago with sciatica, right side: Secondary | ICD-10-CM | POA: Diagnosis not present

## 2019-11-18 DIAGNOSIS — M5416 Radiculopathy, lumbar region: Secondary | ICD-10-CM | POA: Diagnosis not present

## 2019-11-18 DIAGNOSIS — M545 Low back pain: Secondary | ICD-10-CM | POA: Diagnosis not present

## 2019-11-20 DIAGNOSIS — M5441 Lumbago with sciatica, right side: Secondary | ICD-10-CM | POA: Diagnosis not present

## 2019-11-20 DIAGNOSIS — M5416 Radiculopathy, lumbar region: Secondary | ICD-10-CM | POA: Diagnosis not present

## 2019-11-20 DIAGNOSIS — M2560 Stiffness of unspecified joint, not elsewhere classified: Secondary | ICD-10-CM | POA: Diagnosis not present

## 2019-11-20 DIAGNOSIS — M545 Low back pain: Secondary | ICD-10-CM | POA: Diagnosis not present

## 2019-11-24 IMAGING — CT CT NECK W/ CM
2 of 5 series · 5 of 14 positions shown, 6 images · IV contrast (iopamidol)
Comparison: None.

CLINICAL DATA: New tongue cancer diagnosis last week.  Staging.

EXAM:
CT NECK WITH CONTRAST
TECHNIQUE: Multidetector CT imaging of the neck was performed using the
standard protocol following the bolus administration of intravenous
contrast.
CONTRAST:  60mL QT03HV-HQQ IOPAMIDOL (QT03HV-HQQ) INJECTION 61%

[Series 3: neck · axial · 0.46mm/px · z∈[-261,-173]mm · 2 of 133 slices shown, 3 images]
[im 45/133  soft-tissue]
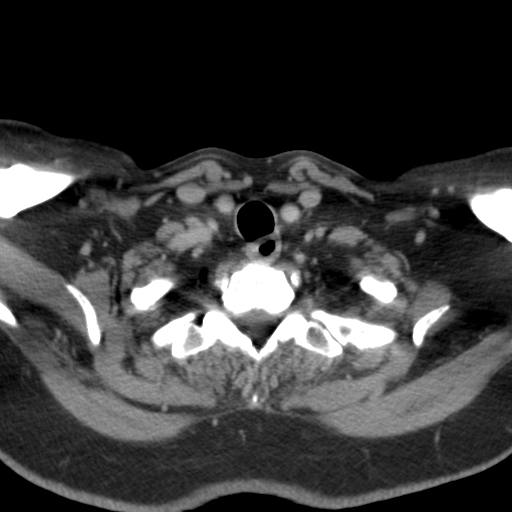
[im 45/133  bone]
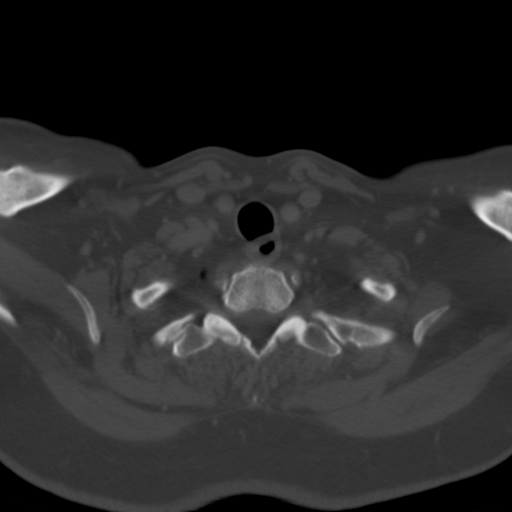
[im 89/133  bone]
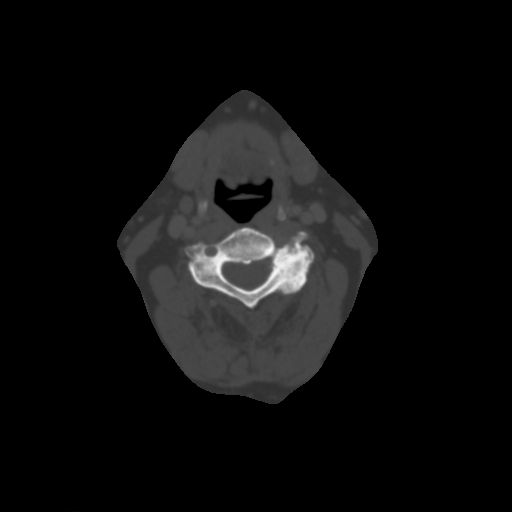

[Series 8: angled axial-oropharynx · axial · 0.39mm/px · z∈[-308,-168]mm · 3 of 145 slices shown]
[im 37/145  bone]
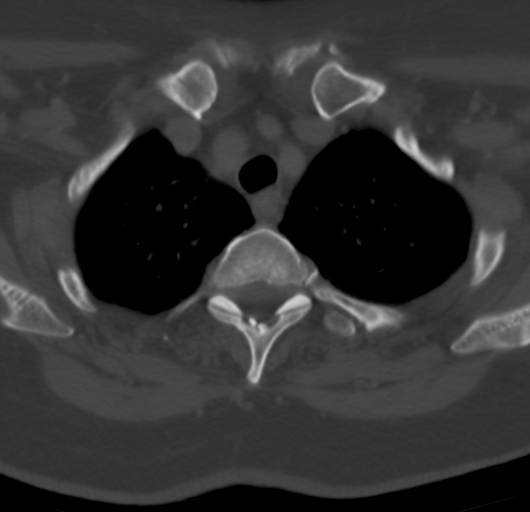
[im 73/145  bone]
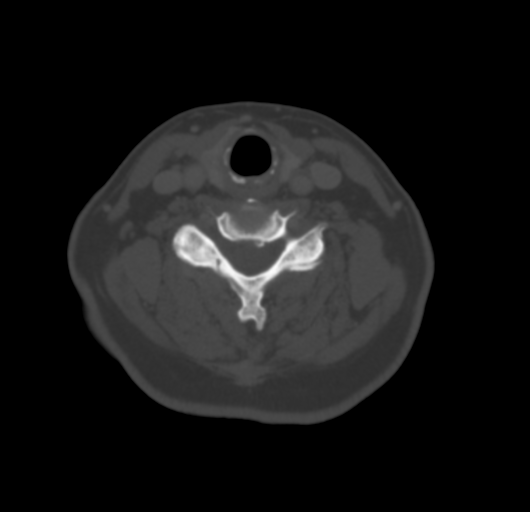
[im 109/145  bone]
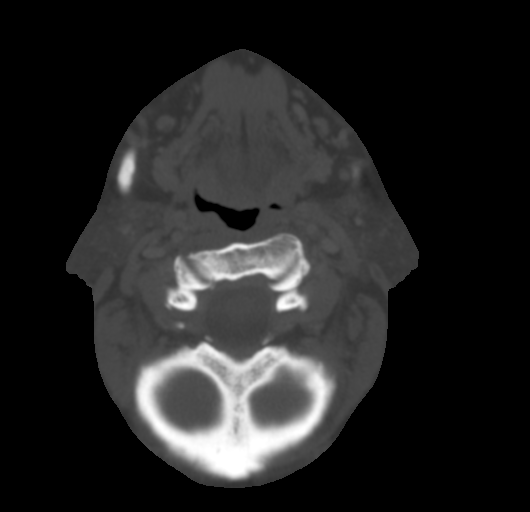

[5 of 14 positions shown; findings below may reference images not displayed]

FINDINGS: Pharynx and larynx: No tongue mass identified by CT. Correlate with
mucosal inspection. Epiglottis and larynx normal. Airway intact.

Salivary glands: Negative

Thyroid: Negative

Lymph nodes: No enlarged or pathologic nodes in the neck.

Vascular: Mild atherosclerotic disease carotid bifurcation
bilaterally.

Limited intracranial: Negative

Visualized orbits: Not image

Mastoids and visualized paranasal sinuses: Mucosal edema maxillary
sinus bilaterally. Mastoid sinus clear bilaterally.

Skeleton: Cervical spine degenerative change. No acute skeletal
abnormality.

Upper chest: Lung apices clear bilaterally

Other:

Creatinine was obtained on site at [HOSPITAL] at [HOSPITAL].

Results: Creatinine 1.2 mg/dL.
IMPRESSION: No adenopathy in the neck.  Tongue cancer not identified by CT.

## 2019-11-26 DIAGNOSIS — M5416 Radiculopathy, lumbar region: Secondary | ICD-10-CM | POA: Diagnosis not present

## 2019-11-26 DIAGNOSIS — M545 Low back pain: Secondary | ICD-10-CM | POA: Diagnosis not present

## 2019-11-26 DIAGNOSIS — M5441 Lumbago with sciatica, right side: Secondary | ICD-10-CM | POA: Diagnosis not present

## 2019-11-26 DIAGNOSIS — M2560 Stiffness of unspecified joint, not elsewhere classified: Secondary | ICD-10-CM | POA: Diagnosis not present

## 2019-12-01 ENCOUNTER — Other Ambulatory Visit: Payer: Self-pay | Admitting: Family Medicine

## 2019-12-01 DIAGNOSIS — M5416 Radiculopathy, lumbar region: Secondary | ICD-10-CM | POA: Diagnosis not present

## 2019-12-01 DIAGNOSIS — M545 Low back pain, unspecified: Secondary | ICD-10-CM | POA: Diagnosis not present

## 2019-12-01 DIAGNOSIS — M5441 Lumbago with sciatica, right side: Secondary | ICD-10-CM | POA: Diagnosis not present

## 2019-12-01 DIAGNOSIS — M2560 Stiffness of unspecified joint, not elsewhere classified: Secondary | ICD-10-CM | POA: Diagnosis not present

## 2019-12-03 ENCOUNTER — Other Ambulatory Visit: Payer: Self-pay

## 2019-12-03 ENCOUNTER — Ambulatory Visit (INDEPENDENT_AMBULATORY_CARE_PROVIDER_SITE_OTHER): Payer: Medicare Other | Admitting: Otolaryngology

## 2019-12-03 VITALS — Temp 97.2°F

## 2019-12-03 DIAGNOSIS — Z85810 Personal history of malignant neoplasm of tongue: Secondary | ICD-10-CM

## 2019-12-03 DIAGNOSIS — M5416 Radiculopathy, lumbar region: Secondary | ICD-10-CM | POA: Diagnosis not present

## 2019-12-03 DIAGNOSIS — M2560 Stiffness of unspecified joint, not elsewhere classified: Secondary | ICD-10-CM | POA: Diagnosis not present

## 2019-12-03 DIAGNOSIS — Z85819 Personal history of malignant neoplasm of unspecified site of lip, oral cavity, and pharynx: Secondary | ICD-10-CM

## 2019-12-03 DIAGNOSIS — M545 Low back pain, unspecified: Secondary | ICD-10-CM | POA: Diagnosis not present

## 2019-12-03 DIAGNOSIS — M5441 Lumbago with sciatica, right side: Secondary | ICD-10-CM | POA: Diagnosis not present

## 2019-12-03 NOTE — Progress Notes (Signed)
HPI: Stacy Wagner is a 79 y.o. female who returns today for evaluation of history of tongue cancer.  She is status post excision of a T1 N0 squamous cell carcinoma of the right lateral tongue in November 2019.  Path report revealed a 1.8 cm tumor with depth of invasion 4 mm.  She had negative margins.  She has been doing well with no specific complaints presents today for follow-up.Marland Kitchen  Past Medical History:  Diagnosis Date  . AC (acromioclavicular) joint bone spurs   . Arthritis    "lower back" (01/15/2018)  . Back pain   . Bursitis disorder    bil shoulders  . Cataracts, bilateral   . Chronic lower back pain   . Complication of anesthesia    some nausea in past with anesthesia, pt. also recalls having the "tube removed too early & her her throat was full of mucous, states that she could move any part of her body)  . Family history of adverse reaction to anesthesia    "daughter gets PONV" (01/15/2018)  . GERD (gastroesophageal reflux disease)   . Hearing loss    bil / no hearing aids  . Hypertension   . Hypothyroidism   . Idiopathic scoliosis   . Leg pain   . Lumbar radiculopathy   . Lumbar stenosis with neurogenic claudication   . Migraine    "used to have them all the time; none in 10 yrs" (01/15/2018)  . Mouth sores   . Neck pain   . OA (osteoarthritis)    Multiple injections in lumbar region   . PONV (postoperative nausea and vomiting)   . Prolapse of female bladder, acquired   . Sagittal plane imbalance   . Skin cancer 11/2017   "left thigh"  . Thyroid disease   . Tongue cancer ([place])    12-2017   Past Surgical History:  Procedure Laterality Date  . ABDOMINAL EXPOSURE N/A 08/04/2019   Procedure: ABDOMINAL EXPOSURE;  Surgeon: Angelia Mould, MD;  Location: [facility] OR;  Service: Vascular;  Laterality: N/A;  . ABDOMINAL HYSTERECTOMY     uterine prolapse  . ABDOMINOPLASTY     tummy tuck Emilio Aspen excess skin arms and legs  . ANTERIOR LATERAL LUMBAR FUSION 4 LEVELS  Right 08/04/2019   Procedure: Lumbar One-Two, Lumbar Two-Three, Lumbar Three-Four, Lumbar Four-Five  Anterolateral Lumbar Interbody Fusion;  Surgeon: Erline Levine, MD;  Location: [place];  Service: Neurosurgery;  Laterality: Right;  . ANTERIOR LUMBAR FUSION N/A 08/04/2019   Procedure: Lumbar Five - Sacral One  Anterior Lumbar Interbody Fusion;  Surgeon: Erline Levine, MD;  Location: [place];  Service: Neurosurgery;  Laterality: N/A;  Dr Deitra Mayo to assist  . APPENDECTOMY    . APPLICATION OF INTRAOPERATIVE CT SCAN N/A 08/07/2019   Procedure: APPLICATION OF INTRAOPERATIVE CT SCAN;  Surgeon: Erline Levine, MD;  Location: [place];  Service: Neurosurgery;  Laterality: N/A;  . BACK SURGERY    . BREAST SURGERY Right 1980   removal of a nodule- post trauma, benign pathology  . CARPAL TUNNEL RELEASE Right   . CATARACT EXTRACTION, BILATERAL    . COLONOSCOPY    . GLOSSECTOMY N/A 01/15/2018   Procedure: PARTIAL GLOSSECTOMY;  Surgeon: Rozetta Nunnery, MD;  Location: [facility] OR;  Service: ENT;  Laterality: N/A;  . LUMBAR SPINE SURGERY  1988   /w Dr. Joya Salm- reports that it was in the lumbar region, unsure of date.  Marland Kitchen NASAL POLYP EXCISION    . PARTIAL GLOSSECTOMY  01/15/2018  .  POSTERIOR LUMBAR FUSION 4 LEVEL N/A 08/07/2019   Procedure: Thoracic Ten to pelvis fixation;  Surgeon: Erline Levine, MD;  Location: [place];  Service: Neurosurgery;  Laterality: N/A;  Thoracic Ten to pelvis fixation  . SKIN CANCER EXCISION Left 11/2017   "thigh"  . TONGUE SURGERY    . TONSILLECTOMY    . TUMMY TUCK     Social History   Socioeconomic History  . Marital status: Married    Spouse name: Not on file  . Number of children: Not on file  . Years of education: Not on file  . Highest education level: Not on file  Occupational History  . Occupation: RETIRED  Tobacco Use  . Smoking status: Never Smoker  . Smokeless tobacco: Never Used  Vaping Use  . Vaping Use: Never used  Substance and Sexual Activity  .  Alcohol use: Not Currently    Alcohol/week: 0.0 standard drinks  . Drug use: Never  . Sexual activity: Not Currently    Partners: Male  Other Topics Concern  . Not on file  Social History Narrative  . Not on file   Social Determinants of Health   Financial Resource Strain:   . Difficulty of Paying Living Expenses: Not on file  Food Insecurity:   . Worried About Charity fundraiser in the Last Year: Not on file  . Ran Out of Food in the Last Year: Not on file  Transportation Needs:   . Lack of Transportation (Medical): Not on file  . Lack of Transportation (Non-Medical): Not on file  Physical Activity:   . Days of Exercise per Week: Not on file  . Minutes of Exercise per Session: Not on file  Stress:   . Feeling of Stress : Not on file  Social Connections:   . Frequency of Communication with Friends and Family: Not on file  . Frequency of Social Gatherings with Friends and Family: Not on file  . Attends Religious Services: Not on file  . Active Member of Clubs or Organizations: Not on file  . Attends Archivist Meetings: Not on file  . Marital Status: Not on file   Family History  Problem Relation Age of Onset  . Cancer Mother        ?lung cancer  . Hypertension Sister   . Diabetes Father   . Stomach cancer Cousin   . Rectal cancer Neg Hx   . Esophageal cancer Neg Hx   . Colon cancer Neg Hx    Allergies  Allergen Reactions  . Codeine Nausea Only   Prior to Admission medications   Medication Sig Start Date End Date Taking? Authorizing Provider  acetaminophen (TYLENOL) 325 MG tablet Take 1-2 tablets (325-650 mg total) by mouth every 4 (four) hours as needed for mild pain. 08/25/19  Yes Love, Ivan Anchors, PA-C  amitriptyline (ELAVIL) 10 MG tablet Take 1 tablet (10 mg total) by mouth at bedtime. 08/26/19  Yes Angiulli, Lavon Paganini, PA-C  Ascorbic Acid (VITAMIN C PO) Take 1 tablet by mouth daily.    Yes [provider]  b complex vitamins tablet Take 1 tablet  by mouth daily.   Yes [provider]  calcium citrate (CALCITRATE - DOSED IN MG ELEMENTAL CALCIUM) 950 (200 Ca) MG tablet Take 1 tablet (200 mg of elemental calcium total) by mouth daily. 08/26/19  Yes Love, Ivan Anchors, PA-C  clonazePAM (KLONOPIN) 0.25 MG disintegrating tablet Take 1 tablet (0.25 mg total) by mouth 2 (two) times daily  as needed (anxiety). 08/26/19  Yes Angiulli, Lavon Paganini, PA-C  diphenhydrAMINE (BENADRYL) 25 MG tablet Take 25 mg by mouth at bedtime as needed for sleep.   Yes [provider]  esomeprazole (NEXIUM) 20 MG capsule Take 20 mg by mouth at bedtime.   Yes [provider]  gabapentin (NEURONTIN) 300 MG capsule Take 2 capsules (600 mg total) by mouth 3 (three) times daily. 08/26/19  Yes Angiulli, Lavon Paganini, PA-C  levothyroxine (SYNTHROID) 75 MCG tablet TAKE 1 TABLET BY MOUTH EVERY DAY ON AN EMPTY STOMACH 12/01/19  Yes Burchette, Alinda Sierras, MD  Menthol-Methyl Salicylate (MUSCLE RUB) 10-15 % CREA Apply 1 application topically as needed for muscle pain. To both legs as needed 08/25/19  Yes Love, Ivan Anchors, PA-C  methocarbamol (ROBAXIN) 750 MG tablet Take 1 tablet (750 mg total) by mouth every 6 (six) hours as needed for muscle spasms. 08/26/19  Yes Angiulli, Lavon Paganini, PA-C  Multiple Vitamins-Minerals (HAIR/SKIN/NAILS/BIOTIN PO) Take 1 tablet by mouth daily.   Yes [provider]  Multiple Vitamins-Minerals (MULTIVITAMIN WITH MINERALS) tablet Take 1 tablet by mouth daily.   Yes [provider]  Multiple Vitamins-Minerals (OSTEO COMPLEX PO) Take 1 tablet by mouth daily.    Yes [provider]  oxyCODONE (OXY IR/ROXICODONE) 5 MG immediate release tablet Take 1-2 tablets (5-10 mg total) by mouth every 3 (three) hours as needed for severe pain. 08/26/19  Yes Angiulli, Lavon Paganini, PA-C  polyethylene glycol (MIRALAX / GLYCOLAX) 17 g packet Take 17 g by mouth daily. 08/26/19  Yes Angiulli, Lavon Paganini, PA-C  traMADol (ULTRAM) 50 MG tablet Take 1 tablet  (50 mg total) by mouth 4 (four) times daily - after meals and at bedtime. 08/26/19  Yes Angiulli, Lavon Paganini, PA-C  triamcinolone cream (KENALOG) 0.1 % Apply 1 application topically 2 (two) times daily as needed (irritation).  07/06/19  Yes [provider]  triamterene-hydrochlorothiazide (MAXZIDE) 75-50 MG tablet Take 1 tablet by mouth daily with breakfast. 08/26/19 09/24/20 Yes Angiulli, Lavon Paganini, PA-C  VITAMIN D PO Take 1 tablet by mouth daily.    Yes [provider]     Positive ROS: Otherwise negative  All other systems have been reviewed and were otherwise negative with the exception of those mentioned in the HPI and as above.  Physical Exam: Constitutional: Alert, well-appearing, no acute distress Ears: External ears without lesions or tenderness. Ear canals are clear bilaterally with intact, clear TMs.  Nasal: External nose without lesions.. Clear nasal passages Oral: Lips and gums without lesions. Tongue and palate mucosa without lesions. Posterior oropharynx clear.  On examination of the tongue there are no mucosal lesions noted.  The tongue is soft to palpation.  She has a defect on the right lateral tongue.  But no mucosal abnormalities. Neck: No palpable adenopathy or masses.  No palpable adenopathy in the right neck or right submandibular region. Respiratory: Breathing comfortably  Skin: No facial/neck lesions or rash noted.  Procedures  Assessment: History of T1N0 squamous cell carcinoma status post partial glossectomy in November 2019.  No evidence of persistent or recurrent disease on clinical exam.  Plan: She will follow-up in 6 months for recheck.   Radene Journey, MD

## 2019-12-04 DIAGNOSIS — Z23 Encounter for immunization: Secondary | ICD-10-CM | POA: Diagnosis not present

## 2019-12-07 ENCOUNTER — Encounter: Payer: Self-pay | Admitting: Family Medicine

## 2019-12-07 ENCOUNTER — Ambulatory Visit (INDEPENDENT_AMBULATORY_CARE_PROVIDER_SITE_OTHER): Payer: Medicare Other | Admitting: Family Medicine

## 2019-12-07 ENCOUNTER — Other Ambulatory Visit: Payer: Self-pay

## 2019-12-07 VITALS — BP 124/80 | HR 73 | Temp 97.9°F | Ht 68.0 in | Wt 158.8 lb

## 2019-12-07 DIAGNOSIS — E039 Hypothyroidism, unspecified: Secondary | ICD-10-CM

## 2019-12-07 DIAGNOSIS — K5900 Constipation, unspecified: Secondary | ICD-10-CM

## 2019-12-07 DIAGNOSIS — I10 Essential (primary) hypertension: Secondary | ICD-10-CM | POA: Diagnosis not present

## 2019-12-07 NOTE — Progress Notes (Signed)
Established Patient Office Visit  Subjective:  Patient ID: Stacy Wagner, female    DOB: May 05, 1940  Age: 79 y.o. MRN: [id number]  CC:  Chief Complaint  Patient presents with  . Constipation    has not been able to use the bathrm regularly since back surgery in June 2021. Using miralax to go, not working as well. Also causing nausea    HPI Stacy Wagner presents for constipation.  She states ever since she had back surgery back in June she has had some difficulties with constipation.  She has had difficulty evacuating at times.  She is still having bowel movements mostly daily but sometimes strains to get these out.  She knows she may not be drinking quite enough fluids.  She is not taking any opioids at this time.  No anticholinergic medications.  She has had some vague abdominal discomfort intermittently but not localizing.  Her last colonoscopy was 11/27/2018.  This showed one small polyp.  She has been taking MiraLAX but does not feel like that has been very effective lately.  She is also had some nausea when she takes that.  No bloody stools.  She has hypertension treated with Maxide.  Blood pressure stable.  She states she has felt poorly overall since she had back surgery in June.  She still having some ongoing back difficulties.  Her husband is also had some issues with cognitive impairment and that has been a stress for her as well.  She has hypothyroidism.  She is on levothyroxine and takes this regularly.  Her last TSH was over a year ago.  She has had some recent alopecia.    Past Medical History:  Diagnosis Date  . AC (acromioclavicular) joint bone spurs   . Arthritis    "lower back" (01/15/2018)  . Back pain   . Bursitis disorder    bil shoulders  . Cataracts, bilateral   . Chronic lower back pain   . Complication of anesthesia    some nausea in past with anesthesia, pt. also recalls having the "tube removed too early & her her throat was full of mucous, states  that she could move any part of her body)  . Family history of adverse reaction to anesthesia    "daughter gets PONV" (01/15/2018)  . GERD (gastroesophageal reflux disease)   . Hearing loss    bil / no hearing aids  . Hypertension   . Hypothyroidism   . Idiopathic scoliosis   . Leg pain   . Lumbar radiculopathy   . Lumbar stenosis with neurogenic claudication   . Migraine    "used to have them all the time; none in 10 yrs" (01/15/2018)  . Mouth sores   . Neck pain   . OA (osteoarthritis)    Multiple injections in lumbar region   . PONV (postoperative nausea and vomiting)   . Prolapse of female bladder, acquired   . Sagittal plane imbalance   . Skin cancer 11/2017   "left thigh"  . Thyroid disease   . Tongue cancer ([place])    12-2017    Past Surgical History:  Procedure Laterality Date  . ABDOMINAL EXPOSURE N/A 08/04/2019   Procedure: ABDOMINAL EXPOSURE;  Surgeon: Angelia Mould, MD;  Location: [facility] OR;  Service: Vascular;  Laterality: N/A;  . ABDOMINAL HYSTERECTOMY     uterine prolapse  . ABDOMINOPLASTY     tummy tuck Emilio Aspen excess skin arms and legs  . ANTERIOR LATERAL LUMBAR FUSION 4 LEVELS  Right 08/04/2019   Procedure: Lumbar One-Two, Lumbar Two-Three, Lumbar Three-Four, Lumbar Four-Five  Anterolateral Lumbar Interbody Fusion;  Surgeon: Erline Levine, MD;  Location: [place];  Service: Neurosurgery;  Laterality: Right;  . ANTERIOR LUMBAR FUSION N/A 08/04/2019   Procedure: Lumbar Five - Sacral One  Anterior Lumbar Interbody Fusion;  Surgeon: Erline Levine, MD;  Location: [place];  Service: Neurosurgery;  Laterality: N/A;  Dr Deitra Mayo to assist  . APPENDECTOMY    . APPLICATION OF INTRAOPERATIVE CT SCAN N/A 08/07/2019   Procedure: APPLICATION OF INTRAOPERATIVE CT SCAN;  Surgeon: Erline Levine, MD;  Location: [place];  Service: Neurosurgery;  Laterality: N/A;  . BACK SURGERY    . BREAST SURGERY Right 1980   removal of a nodule- post trauma, benign pathology  . CARPAL  TUNNEL RELEASE Right   . CATARACT EXTRACTION, BILATERAL    . COLONOSCOPY    . GLOSSECTOMY N/A 01/15/2018   Procedure: PARTIAL GLOSSECTOMY;  Surgeon: Rozetta Nunnery, MD;  Location: [facility] OR;  Service: ENT;  Laterality: N/A;  . LUMBAR SPINE SURGERY  1988   /w Dr. Joya Salm- reports that it was in the lumbar region, unsure of date.  Marland Kitchen NASAL POLYP EXCISION    . PARTIAL GLOSSECTOMY  01/15/2018  . POSTERIOR LUMBAR FUSION 4 LEVEL N/A 08/07/2019   Procedure: Thoracic Ten to pelvis fixation;  Surgeon: Erline Levine, MD;  Location: [place];  Service: Neurosurgery;  Laterality: N/A;  Thoracic Ten to pelvis fixation  . SKIN CANCER EXCISION Left 11/2017   "thigh"  . TONGUE SURGERY    . TONSILLECTOMY    . TUMMY TUCK      Family History  Problem Relation Age of Onset  . Cancer Mother        ?lung cancer  . Hypertension Sister   . Diabetes Father   . Stomach cancer Cousin   . Rectal cancer Neg Hx   . Esophageal cancer Neg Hx   . Colon cancer Neg Hx     Social History   Socioeconomic History  . Marital status: Married    Spouse name: Not on file  . Number of children: Not on file  . Years of education: Not on file  . Highest education level: Not on file  Occupational History  . Occupation: RETIRED  Tobacco Use  . Smoking status: Never Smoker  . Smokeless tobacco: Never Used  Vaping Use  . Vaping Use: Never used  Substance and Sexual Activity  . Alcohol use: Not Currently    Alcohol/week: 0.0 standard drinks  . Drug use: Never  . Sexual activity: Not Currently    Partners: Male  Other Topics Concern  . Not on file  Social History Narrative  . Not on file   Social Determinants of Health   Financial Resource Strain:   . Difficulty of Paying Living Expenses: Not on file  Food Insecurity:   . Worried About Charity fundraiser in the Last Year: Not on file  . Ran Out of Food in the Last Year: Not on file  Transportation Needs:   . Lack of Transportation (Medical): Not on file    . Lack of Transportation (Non-Medical): Not on file  Physical Activity:   . Days of Exercise per Week: Not on file  . Minutes of Exercise per Session: Not on file  Stress:   . Feeling of Stress : Not on file  Social Connections:   . Frequency of Communication with Friends and Family: Not on file  .  Frequency of Social Gatherings with Friends and Family: Not on file  . Attends Religious Services: Not on file  . Active Member of Clubs or Organizations: Not on file  . Attends Archivist Meetings: Not on file  . Marital Status: Not on file  Intimate Partner Violence:   . Fear of Current or Ex-Partner: Not on file  . Emotionally Abused: Not on file  . Physically Abused: Not on file  . Sexually Abused: Not on file    Outpatient Medications Prior to Visit  Medication Sig Dispense Refill  . acetaminophen (TYLENOL) 325 MG tablet Take 1-2 tablets (325-650 mg total) by mouth every 4 (four) hours as needed for mild pain.    . Ascorbic Acid (VITAMIN C PO) Take 1 tablet by mouth daily.     Marland Kitchen b complex vitamins tablet Take 1 tablet by mouth daily.    . calcium citrate (CALCITRATE - DOSED IN MG ELEMENTAL CALCIUM) 950 (200 Ca) MG tablet Take 1 tablet (200 mg of elemental calcium total) by mouth daily.    . clonazePAM (KLONOPIN) 0.25 MG disintegrating tablet Take 1 tablet (0.25 mg total) by mouth 2 (two) times daily as needed (anxiety). 30 tablet 0  . diphenhydrAMINE (BENADRYL) 25 MG tablet Take 25 mg by mouth at bedtime as needed for sleep.    Marland Kitchen esomeprazole (NEXIUM) 20 MG capsule Take 20 mg by mouth at bedtime.    Marland Kitchen levothyroxine (SYNTHROID) 75 MCG tablet TAKE 1 TABLET BY MOUTH EVERY DAY ON AN EMPTY STOMACH 90 tablet 0  . Menthol-Methyl Salicylate (MUSCLE RUB) 10-15 % CREA Apply 1 application topically as needed for muscle pain. To both legs as needed  0  . methocarbamol (ROBAXIN) 750 MG tablet Take 1 tablet (750 mg total) by mouth every 6 (six) hours as needed for muscle spasms. 60 tablet  0  . Multiple Vitamins-Minerals (HAIR/SKIN/NAILS/BIOTIN PO) Take 1 tablet by mouth daily.    . Multiple Vitamins-Minerals (MULTIVITAMIN WITH MINERALS) tablet Take 1 tablet by mouth daily.    . Multiple Vitamins-Minerals (OSTEO COMPLEX PO) Take 1 tablet by mouth daily.     . polyethylene glycol (MIRALAX / GLYCOLAX) 17 g packet Take 17 g by mouth daily. 14 each 0  . triamcinolone cream (KENALOG) 0.1 % Apply 1 application topically 2 (two) times daily as needed (irritation).     . triamterene-hydrochlorothiazide (MAXZIDE) 75-50 MG tablet Take 1 tablet by mouth daily with breakfast. 90 tablet 1  . VITAMIN D PO Take 1 tablet by mouth daily.     Marland Kitchen amitriptyline (ELAVIL) 10 MG tablet Take 1 tablet (10 mg total) by mouth at bedtime. 30 tablet 0  . gabapentin (NEURONTIN) 300 MG capsule Take 2 capsules (600 mg total) by mouth 3 (three) times daily. 90 capsule 1  . oxyCODONE (OXY IR/ROXICODONE) 5 MG immediate release tablet Take 1-2 tablets (5-10 mg total) by mouth every 3 (three) hours as needed for severe pain. 30 tablet 0  . traMADol (ULTRAM) 50 MG tablet Take 1 tablet (50 mg total) by mouth 4 (four) times daily - after meals and at bedtime. 30 tablet 0   No facility-administered medications prior to visit.    Allergies  Allergen Reactions  . Codeine Nausea Only    ROS Review of Systems  Constitutional: Negative for chills and fever.  Respiratory: Negative for cough and shortness of breath.   Cardiovascular: Negative for chest pain.  Gastrointestinal: Positive for abdominal pain and constipation. Negative for abdominal distention, diarrhea and  vomiting.  Genitourinary: Negative for dysuria.  Musculoskeletal: Positive for back pain.      Objective:    Physical Exam Vitals reviewed.  Constitutional:      Appearance: Normal appearance.  Cardiovascular:     Rate and Rhythm: Normal rate and regular rhythm.  Pulmonary:     Effort: Pulmonary effort is normal.     Breath sounds: Normal  breath sounds.  Abdominal:     General: Abdomen is flat. Bowel sounds are normal. There is no distension.     Palpations: Abdomen is soft. There is no mass.     Tenderness: There is no abdominal tenderness. There is no guarding or rebound.  Neurological:     Mental Status: She is alert.     BP 124/80   Pulse 73   Temp 97.9 F (36.6 C)   Ht 5\' 8"  (1.727 m)   Wt 158 lb 12.8 oz (72 kg)   SpO2 92%   BMI 24.15 kg/m  Wt Readings from Last 3 Encounters:  12/07/19 158 lb 12.8 oz (72 kg)  08/13/19 158 lb 11.7 oz (72 kg)  08/04/19 159 lb (72.1 kg)     There are no preventive care reminders to display for this patient.  There are no preventive care reminders to display for this patient.  Lab Results  Component Value Date   TSH 1.57 10/21/2018   Lab Results  Component Value Date   WBC 11.8 (H) 08/24/2019   HGB 10.2 (L) 08/24/2019   HCT 31.6 (L) 08/24/2019   MCV 94.6 08/24/2019   PLT 328 08/24/2019   Lab Results  Component Value Date   NA 139 08/24/2019   K 4.6 08/24/2019   CO2 31 08/24/2019   GLUCOSE 115 (H) 08/24/2019   BUN 23 08/24/2019   CREATININE 0.82 08/24/2019   BILITOT 0.6 08/11/2019   ALKPHOS 66 08/11/2019   AST 26 08/11/2019   ALT 22 08/11/2019   PROT 5.7 (L) 08/11/2019   ALBUMIN 2.4 (L) 08/11/2019   CALCIUM 8.5 (L) 08/24/2019   ANIONGAP 8 08/24/2019   GFR 70.76 10/21/2018   Lab Results  Component Value Date   CHOL 206 (H) 10/21/2018   Lab Results  Component Value Date   HDL 45.30 10/21/2018   Lab Results  Component Value Date   LDLCALC 140 (H) 10/21/2018   Lab Results  Component Value Date   TRIG 100.0 10/21/2018   Lab Results  Component Value Date   CHOLHDL 5 10/21/2018   No results found for: HGBA1C    Assessment & Plan:   Problem List Items Addressed This Visit      Unprioritized   Essential hypertension   Hypothyroidism   Relevant Orders   TSH    Other Visit Diagnoses    Constipation, unspecified constipation type    -   Primary    Patient relates some persistent constipation.  She is going fairly often but has some straining still. -Discussed the basics of adequate fluid, at least 20 to 25 g of fiber per day, and increased ambulation is much as possible -Consider trial of Senokot-S 2 tablets once daily and continue MiraLAX as needed  Recheck TSH  No orders of the defined types were placed in this encounter.   Follow-up: Return in about 1 month (around 01/07/2020).    Carolann Littler, MD

## 2019-12-07 NOTE — Patient Instructions (Addendum)
High-Fiber Diet Fiber, also called dietary fiber, is a type of carbohydrate that is found in fruits, vegetables, whole grains, and beans. A high-fiber diet can have many health benefits. Your health care provider may recommend a high-fiber diet to help:  Prevent constipation. Fiber can make your bowel movements more regular.  Lower your cholesterol.  Relieve the following conditions: ? Swelling of veins in the anus (hemorrhoids). ? Swelling and irritation (inflammation) of specific areas of the digestive tract (uncomplicated diverticulosis). ? A problem of the large intestine (colon) that sometimes causes pain and diarrhea (irritable bowel syndrome, IBS).  Prevent overeating as part of a weight-loss plan.  Prevent heart disease, type 2 diabetes, and certain cancers. What is my plan? The recommended daily fiber intake in grams (g) includes:  38 g for men age 50 or younger.  30 g for men over age 50.  25 g for women age 50 or younger.  21 g for women over age 50. You can get the recommended daily intake of dietary fiber by:  Eating a variety of fruits, vegetables, grains, and beans.  Taking a fiber supplement, if it is not possible to get enough fiber through your diet. What do I need to know about a high-fiber diet?  It is better to get fiber through food sources rather than from fiber supplements. There is not a lot of research about how effective supplements are.  Always check the fiber content on the nutrition facts label of any prepackaged food. Look for foods that contain 5 g of fiber or more per serving.  Talk with a diet and nutrition specialist (dietitian) if you have questions about specific foods that are recommended or not recommended for your medical condition, especially if those foods are not listed below.  Gradually increase how much fiber you consume. If you increase your intake of dietary fiber too quickly, you may have bloating, cramping, or gas.  Drink plenty  of water. Water helps you to digest fiber. What are tips for following this plan?  Eat a wide variety of high-fiber foods.  Make sure that half of the grains that you eat each day are whole grains.  Eat breads and cereals that are made with whole-grain flour instead of refined flour or white flour.  Eat brown rice, bulgur wheat, or millet instead of white rice.  Start the day with a breakfast that is high in fiber, such as a cereal that contains 5 g of fiber or more per serving.  Use beans in place of meat in soups, salads, and pasta dishes.  Eat high-fiber snacks, such as berries, raw vegetables, nuts, and popcorn.  Choose whole fruits and vegetables instead of processed forms like juice or sauce. What foods can I eat?  Fruits Berries. Pears. Apples. Oranges. Avocado. Prunes and raisins. Dried figs. Vegetables Sweet potatoes. Spinach. Kale. Artichokes. Cabbage. Broccoli. Cauliflower. Green peas. Carrots. Squash. Grains Whole-grain breads. Multigrain cereal. Oats and oatmeal. Brown rice. Barley. Bulgur wheat. Millet. Quinoa. Bran muffins. Popcorn. Rye wafer crackers. Meats and other proteins Navy, kidney, and pinto beans. Soybeans. Split peas. Lentils. Nuts and seeds. Dairy Fiber-fortified yogurt. Beverages Fiber-fortified soy milk. Fiber-fortified orange juice. Other foods Fiber bars. The items listed above may not be a complete list of recommended foods and beverages. Contact a dietitian for more options. What foods are not recommended? Fruits Fruit juice. Cooked, strained fruit. Vegetables Fried potatoes. Canned vegetables. Well-cooked vegetables. Grains White bread. Pasta made with refined flour. White rice. Meats and other   proteins Fatty cuts of meat. Fried chicken or fried fish. Dairy Milk. Yogurt. Cream cheese. Sour cream. Fats and oils Butters. Beverages Soft drinks. Other foods Cakes and pastries. The items listed above may not be a complete list of foods  and beverages to avoid. Contact a dietitian for more information. Summary  Fiber is a type of carbohydrate. It is found in fruits, vegetables, whole grains, and beans.  There are many health benefits of eating a high-fiber diet, such as preventing constipation, lowering blood cholesterol, helping with weight loss, and reducing your risk of heart disease, diabetes, and certain cancers.  Gradually increase your intake of fiber. Increasing too fast can result in cramping, bloating, and gas. Drink plenty of water while you increase your fiber.  The best sources of fiber include whole fruits and vegetables, whole grains, nuts, seeds, and beans. This information is not intended to replace advice given to you by your health care provider. Make sure you discuss any questions you have with your health care provider. Document Revised: 12/17/2016 Document Reviewed: 12/17/2016 Elsevier Patient Education  [place]. Constipation, Adult Constipation is when a person has fewer bowel movements in a week than normal, has difficulty having a bowel movement, or has stools that are dry, hard, or larger than normal. Constipation may be caused by an underlying condition. It may become worse with age if a person takes certain medicines and does not take in enough fluids. Follow these instructions at home: Eating and drinking   Eat foods that have a lot of fiber, such as fresh fruits and vegetables, whole grains, and beans.  Limit foods that are high in fat, low in fiber, or overly processed, such as french fries, hamburgers, cookies, candies, and soda.  Drink enough fluid to keep your urine clear or pale yellow. General instructions  Exercise regularly or as told by your health care provider.  Go to the restroom when you have the urge to go. Do not hold it in.  Take over-the-counter and prescription medicines only as told by your health care provider. These include any fiber supplements.  Practice  pelvic floor retraining exercises, such as deep breathing while relaxing the lower abdomen and pelvic floor relaxation during bowel movements.  Watch your condition for any changes.  Keep all follow-up visits as told by your health care provider. This is important. Contact a health care provider if:  You have pain that gets worse.  You have a fever.  You do not have a bowel movement after 4 days.  You vomit.  You are not hungry.  You lose weight.  You are bleeding from the anus.  You have thin, pencil-like stools. Get help right away if:  You have a fever and your symptoms suddenly get worse.  You leak stool or have blood in your stool.  Your abdomen is bloated.  You have severe pain in your abdomen.  You feel dizzy or you faint. This information is not intended to replace advice given to you by your health care provider. Make sure you discuss any questions you have with your health care provider. Document Revised: 01/25/2017 Document Reviewed: 08/03/2015 Elsevier Patient Education  [place] as much as possible Increase water intake Increase fiber Consider trial of Senokot S- two tablets daily.  This is over the counter.

## 2019-12-08 DIAGNOSIS — M5416 Radiculopathy, lumbar region: Secondary | ICD-10-CM | POA: Diagnosis not present

## 2019-12-08 DIAGNOSIS — M2560 Stiffness of unspecified joint, not elsewhere classified: Secondary | ICD-10-CM | POA: Diagnosis not present

## 2019-12-08 DIAGNOSIS — M545 Low back pain, unspecified: Secondary | ICD-10-CM | POA: Diagnosis not present

## 2019-12-08 DIAGNOSIS — M5441 Lumbago with sciatica, right side: Secondary | ICD-10-CM | POA: Diagnosis not present

## 2019-12-08 LAB — TSH: TSH: 2.49 mIU/L (ref 0.40–4.50)

## 2019-12-16 ENCOUNTER — Telehealth: Payer: Self-pay | Admitting: Family Medicine

## 2019-12-16 NOTE — Telephone Encounter (Signed)
Left message for patient to schedule Annual Wellness Visit.  Please schedule with Nurse Health Advisor Shannon Crews, RN at [facility]  

## 2019-12-22 DIAGNOSIS — M6281 Muscle weakness (generalized): Secondary | ICD-10-CM | POA: Diagnosis not present

## 2019-12-22 DIAGNOSIS — M5441 Lumbago with sciatica, right side: Secondary | ICD-10-CM | POA: Diagnosis not present

## 2019-12-22 DIAGNOSIS — M5416 Radiculopathy, lumbar region: Secondary | ICD-10-CM | POA: Diagnosis not present

## 2019-12-22 DIAGNOSIS — M2560 Stiffness of unspecified joint, not elsewhere classified: Secondary | ICD-10-CM | POA: Diagnosis not present

## 2019-12-24 DIAGNOSIS — M6281 Muscle weakness (generalized): Secondary | ICD-10-CM | POA: Diagnosis not present

## 2019-12-24 DIAGNOSIS — M5441 Lumbago with sciatica, right side: Secondary | ICD-10-CM | POA: Diagnosis not present

## 2019-12-24 DIAGNOSIS — M5416 Radiculopathy, lumbar region: Secondary | ICD-10-CM | POA: Diagnosis not present

## 2019-12-24 DIAGNOSIS — M2560 Stiffness of unspecified joint, not elsewhere classified: Secondary | ICD-10-CM | POA: Diagnosis not present

## 2019-12-29 DIAGNOSIS — M5441 Lumbago with sciatica, right side: Secondary | ICD-10-CM | POA: Diagnosis not present

## 2019-12-29 DIAGNOSIS — M5416 Radiculopathy, lumbar region: Secondary | ICD-10-CM | POA: Diagnosis not present

## 2019-12-29 DIAGNOSIS — M2560 Stiffness of unspecified joint, not elsewhere classified: Secondary | ICD-10-CM | POA: Diagnosis not present

## 2019-12-29 DIAGNOSIS — M6281 Muscle weakness (generalized): Secondary | ICD-10-CM | POA: Diagnosis not present

## 2019-12-31 DIAGNOSIS — M5441 Lumbago with sciatica, right side: Secondary | ICD-10-CM | POA: Diagnosis not present

## 2019-12-31 DIAGNOSIS — M6281 Muscle weakness (generalized): Secondary | ICD-10-CM | POA: Diagnosis not present

## 2019-12-31 DIAGNOSIS — M2560 Stiffness of unspecified joint, not elsewhere classified: Secondary | ICD-10-CM | POA: Diagnosis not present

## 2019-12-31 DIAGNOSIS — M5416 Radiculopathy, lumbar region: Secondary | ICD-10-CM | POA: Diagnosis not present

## 2020-01-01 ENCOUNTER — Ambulatory Visit (INDEPENDENT_AMBULATORY_CARE_PROVIDER_SITE_OTHER): Payer: Medicare Other | Admitting: Family Medicine

## 2020-01-01 ENCOUNTER — Other Ambulatory Visit: Payer: Self-pay

## 2020-01-01 ENCOUNTER — Encounter: Payer: Self-pay | Admitting: Family Medicine

## 2020-01-01 VITALS — BP 110/60 | HR 88 | Temp 97.9°F | Ht 68.0 in | Wt 150.9 lb

## 2020-01-01 DIAGNOSIS — Z780 Asymptomatic menopausal state: Secondary | ICD-10-CM

## 2020-01-01 DIAGNOSIS — I10 Essential (primary) hypertension: Secondary | ICD-10-CM

## 2020-01-01 DIAGNOSIS — E039 Hypothyroidism, unspecified: Secondary | ICD-10-CM

## 2020-01-01 MED ORDER — LEVOTHYROXINE SODIUM 75 MCG PO TABS: ORAL_TABLET | ORAL | 3 refills | Status: DC

## 2020-01-01 MED ORDER — ESTRADIOL 0.5 MG PO TABS: 0.5000 mg | ORAL_TABLET | Freq: Every day | ORAL | 3 refills | Status: DC

## 2020-01-01 MED ORDER — TRIAMTERENE-HCTZ 75-50 MG PO TABS: 1.0000 | ORAL_TABLET | Freq: Every day | ORAL | 3 refills | Status: DC

## 2020-01-01 NOTE — Patient Instructions (Signed)
Menopause and Hormone Replacement Therapy Menopause is a normal time of life when menstrual periods stop completely and the ovaries stop producing the female hormones estrogen and progesterone. This lack of hormones can affect your health and cause undesirable symptoms. Hormone replacement therapy (HRT) can relieve some of those symptoms. What is hormone replacement therapy? HRT is the use of artificial (synthetic) hormones to replace hormones that your body has stopped producing because you have reached menopause. What are my options for HRT?  HRT may consist of the synthetic hormones estrogen and progestin, or it may consist of only estrogen (estrogen-only therapy). You and your health care provider will decide which form of HRT is best for you. If you choose to be on HRT and you have a uterus, estrogen and progestin are usually prescribed. Estrogen-only therapy is used for women who do not have a uterus. Possible options for taking HRT include:  Pills.  Patches.  Gels.  Sprays.  Vaginal cream.  Vaginal rings.  Vaginal inserts. The amount of hormone(s) that you take and how long you take the hormone(s) varies according to your health. It is important to:  Begin HRT with the lowest possible dosage.  Stop HRT as soon as your health care provider tells you to stop.  Work with your health care provider so that you feel informed and comfortable with your decisions. What are the benefits of HRT? HRT can reduce the frequency and severity of menopausal symptoms. Benefits of HRT vary according to the kind of symptoms that you have, how severe they are, and your overall health. HRT may help to improve the following symptoms of menopause:  Hot flashes and night sweats. These are sudden feelings of heat that spread over the face and body. The skin may turn red, like a blush. Night sweats are hot flashes that happen while you are sleeping or trying to sleep.  Bone loss (osteoporosis). The  body loses calcium more quickly after menopause, causing the bones to become weaker. This can increase the risk for bone breaks (fractures).  Vaginal dryness. The lining of the vagina can become thin and dry, which can cause pain during sex or cause infection, burning, or itching.  Urinary tract infections.  Urinary incontinence. This is the inability to control when you pass urine.  Irritability.  Short-term memory problems. What are the risks of HRT? Risks of HRT vary depending on your individual health and medical history. Risks of HRT also depend on whether you receive both estrogen and progestin or you receive estrogen only. HRT may increase the risk of:  Spotting. This is when a small amount of blood leaks from the vagina unexpectedly.  Endometrial cancer. This cancer is in the lining of the uterus (endometrium).  Breast cancer.  Increased density of breast tissue. This can make it harder to find breast cancer on a breast X-ray (mammogram).  Stroke.  Heart disease.  Blood clots.  Gallbladder disease.  Liver disease. Risks of HRT can increase if you have any of the following conditions:  Endometrial cancer.  Liver disease.  Heart disease.  Breast cancer.  History of blood clots.  History of stroke. Follow these instructions at home:  Take over-the-counter and prescription medicines only as told by your health care provider.  Get mammograms, pelvic exams, and medical checkups as often as told by your health care provider.  Have Pap tests done as often as told by your health care provider. A Pap test is sometimes called a Pap smear. It   is a screening test that is used to check for signs of cancer of the cervix and vagina. A Pap test can also identify the presence of infection or precancerous changes. Pap tests may be done: ? Every 3 years, starting at age 21. ? Every 5 years, starting after age 30, in combination with testing for human papillomavirus  (HPV). ? More often or less often depending on other medical conditions you have, your age, and other risk factors.  It is up to you to get the results of your Pap test. Ask your health care provider, or the department that is doing the test, when your results will be ready.  Keep all follow-up visits as told by your health care provider. This is important. Contact a health care provider if you have:  Pain or swelling in your legs.  Shortness of breath.  Chest pain.  Lumps or changes in your breasts or armpits.  Slurred speech.  Pain, burning, or bleeding when you urinate.  Unusual vaginal bleeding.  Dizziness or headaches.  Weakness or numbness in any part of your arms or legs.  Pain in your abdomen. Summary  Menopause is a normal time of life when menstrual periods stop completely and the ovaries stop producing the female hormones estrogen and progesterone.  Hormone replacement therapy (HRT) can relieve some of the symptoms of menopause.  HRT can reduce the frequency and severity of menopausal symptoms.  Risks of HRT vary depending on your individual health and medical history. This information is not intended to replace advice given to you by your health care provider. Make sure you discuss any questions you have with your health care provider. Document Revised: 10/15/2017 Document Reviewed: 10/15/2017 Elsevier Patient Education  2020 [employer]  

## 2020-01-01 NOTE — Progress Notes (Signed)
Established Patient Office Visit  Subjective:  Patient ID: Stacy Wagner, female    DOB: Jan 11, 1941  Age: 79 y.o. MRN: [id number]  CC:  Chief Complaint  Patient presents with  . med check    med check , need refills, does going back on  medication     HPI Stacy Wagner presents for medical follow-up.  She has history of hypertension, past history of cancer involving the lateral tongue, GERD, hypothyroidism, osteoarthritis involving multiple joints, hyperlipidemia.  She had fairly extensive back surgery several months ago and has slowly been recovering from that.  She needs refills of Maxide and levothyroxine.  She had recent TSH in October which was normal.  Previous electrolytes have been stable.  Good renal function.  Her major question has to do with hormone replacement therapy.  She was placed several years ago on Estratest 1.25/2.5 mg.  She was taken off this at the time of her surgery.  She is adamantly wanting to go back on this at this time.  She is aware of increased clotting risk with estrogen therapy.  She was placed on the testosterone for low libido and she states that is not really a concern at this time.  She states that she feels very fatigued and very poor overall and she is not on estrogen.  She is getting regular mammograms.  She has no history of DVT or PE.  She has become much more active in recent months following her surgery.  Past Medical History:  Diagnosis Date  . AC (acromioclavicular) joint bone spurs   . Arthritis    "lower back" (01/15/2018)  . Back pain   . Bursitis disorder    bil shoulders  . Cataracts, bilateral   . Chronic lower back pain   . Complication of anesthesia    some nausea in past with anesthesia, pt. also recalls having the "tube removed too early & her her throat was full of mucous, states that she could move any part of her body)  . Family history of adverse reaction to anesthesia    "daughter gets PONV" (01/15/2018)  . GERD  (gastroesophageal reflux disease)   . Hearing loss    bil / no hearing aids  . Hypertension   . Hypothyroidism   . Idiopathic scoliosis   . Leg pain   . Lumbar radiculopathy   . Lumbar stenosis with neurogenic claudication   . Migraine    "used to have them all the time; none in 10 yrs" (01/15/2018)  . Mouth sores   . Neck pain   . OA (osteoarthritis)    Multiple injections in lumbar region   . PONV (postoperative nausea and vomiting)   . Prolapse of female bladder, acquired   . Sagittal plane imbalance   . Skin cancer 11/2017   "left thigh"  . Thyroid disease   . Tongue cancer ([place])    12-2017    Past Surgical History:  Procedure Laterality Date  . ABDOMINAL EXPOSURE N/A 08/04/2019   Procedure: ABDOMINAL EXPOSURE;  Surgeon: Angelia Mould, MD;  Location[facility] OR;  Service: Vascular;  Laterality: N/A;  . ABDOMINAL HYSTERECTOMY     uterine prolapse  . ABDOMINOPLASTY     tummy tuck Emilio Aspen excess skin arms and legs  . ANTERIOR LATERAL LUMBAR FUSION 4 LEVELS Right 08/04/2019   Procedure: Lumbar One-Two, Lumbar Two-Three, Lumbar Three-Four, Lumbar Four-Five  Anterolateral Lumbar Interbody Fusion;  Surgeon: Erline Levine, MD;  Location: [place];  Service: Neurosurgery;  Laterality: Right;  . ANTERIOR LUMBAR FUSION N/A 08/04/2019   Procedure: Lumbar Five - Sacral One  Anterior Lumbar Interbody Fusion;  Surgeon: Erline Levine, MD;  Location: [place];  Service: Neurosurgery;  Laterality: N/A;  Dr Deitra Mayo to assist  . APPENDECTOMY    . APPLICATION OF INTRAOPERATIVE CT SCAN N/A 08/07/2019   Procedure: APPLICATION OF INTRAOPERATIVE CT SCAN;  Surgeon: Erline Levine, MD;  Location: [place];  Service: Neurosurgery;  Laterality: N/A;  . BACK SURGERY    . BREAST SURGERY Right 1980   removal of a nodule- post trauma, benign pathology  . CARPAL TUNNEL RELEASE Right   . CATARACT EXTRACTION, BILATERAL    . COLONOSCOPY    . GLOSSECTOMY N/A 01/15/2018   Procedure: PARTIAL GLOSSECTOMY;   Surgeon: Rozetta Nunnery, MD;  Location: [facility] OR;  Service: ENT;  Laterality: N/A;  . LUMBAR SPINE SURGERY  1988   /w Dr. Joya Salm- reports that it was in the lumbar region, unsure of date.  Marland Kitchen NASAL POLYP EXCISION    . PARTIAL GLOSSECTOMY  01/15/2018  . POSTERIOR LUMBAR FUSION 4 LEVEL N/A 08/07/2019   Procedure: Thoracic Ten to pelvis fixation;  Surgeon: Erline Levine, MD;  Location: [place];  Service: Neurosurgery;  Laterality: N/A;  Thoracic Ten to pelvis fixation  . SKIN CANCER EXCISION Left 11/2017   "thigh"  . TONGUE SURGERY    . TONSILLECTOMY    . TUMMY TUCK      Family History  Problem Relation Age of Onset  . Cancer Mother        ?lung cancer  . Hypertension Sister   . Diabetes Father   . Stomach cancer Cousin   . Rectal cancer Neg Hx   . Esophageal cancer Neg Hx   . Colon cancer Neg Hx     Social History   Socioeconomic History  . Marital status: Married    Spouse name: Not on file  . Number of children: Not on file  . Years of education: Not on file  . Highest education level: Not on file  Occupational History  . Occupation: RETIRED  Tobacco Use  . Smoking status: Never Smoker  . Smokeless tobacco: Never Used  Vaping Use  . Vaping Use: Never used  Substance and Sexual Activity  . Alcohol use: Not Currently    Alcohol/week: 0.0 standard drinks  . Drug use: Never  . Sexual activity: Not Currently    Partners: Male  Other Topics Concern  . Not on file  Social History Narrative  . Not on file   Social Determinants of Health   Financial Resource Strain:   . Difficulty of Paying Living Expenses: Not on file  Food Insecurity:   . Worried About Charity fundraiser in the Last Year: Not on file  . Ran Out of Food in the Last Year: Not on file  Transportation Needs:   . Lack of Transportation (Medical): Not on file  . Lack of Transportation (Non-Medical): Not on file  Physical Activity:   . Days of Exercise per Week: Not on file  . Minutes of Exercise  per Session: Not on file  Stress:   . Feeling of Stress : Not on file  Social Connections:   . Frequency of Communication with Friends and Family: Not on file  . Frequency of Social Gatherings with Friends and Family: Not on file  . Attends Religious Services: Not on file  . Active Member of Clubs or Organizations: Not on file  .  Attends Archivist Meetings: Not on file  . Marital Status: Not on file  Intimate Partner Violence:   . Fear of Current or Ex-Partner: Not on file  . Emotionally Abused: Not on file  . Physically Abused: Not on file  . Sexually Abused: Not on file    Outpatient Medications Prior to Visit  Medication Sig Dispense Refill  . acetaminophen (TYLENOL) 325 MG tablet Take 1-2 tablets (325-650 mg total) by mouth every 4 (four) hours as needed for mild pain.    . Ascorbic Acid (VITAMIN C PO) Take 1 tablet by mouth daily.     Marland Kitchen b complex vitamins tablet Take 1 tablet by mouth daily.    . calcium citrate (CALCITRATE - DOSED IN MG ELEMENTAL CALCIUM) 950 (200 Ca) MG tablet Take 1 tablet (200 mg of elemental calcium total) by mouth daily.    . diphenhydrAMINE (BENADRYL) 25 MG tablet Take 25 mg by mouth at bedtime as needed for sleep.    . Menthol-Methyl Salicylate (MUSCLE RUB) 10-15 % CREA Apply 1 application topically as needed for muscle pain. To both legs as needed  0  . methocarbamol (ROBAXIN) 750 MG tablet Take 1 tablet (750 mg total) by mouth every 6 (six) hours as needed for muscle spasms. 60 tablet 0  . Multiple Vitamins-Minerals (HAIR/SKIN/NAILS/BIOTIN PO) Take 1 tablet by mouth daily.    . Multiple Vitamins-Minerals (MULTIVITAMIN WITH MINERALS) tablet Take 1 tablet by mouth daily.    . Multiple Vitamins-Minerals (OSTEO COMPLEX PO) Take 1 tablet by mouth daily.     Marland Kitchen triamcinolone cream (KENALOG) 0.1 % Apply 1 application topically 2 (two) times daily as needed (irritation).     Marland Kitchen VITAMIN D PO Take 1 tablet by mouth daily.     Marland Kitchen levothyroxine (SYNTHROID)  75 MCG tablet TAKE 1 TABLET BY MOUTH EVERY DAY ON AN EMPTY STOMACH 90 tablet 0  . triamterene-hydrochlorothiazide (MAXZIDE) 75-50 MG tablet Take 1 tablet by mouth daily with breakfast. 90 tablet 1  . polyethylene glycol (MIRALAX / GLYCOLAX) 17 g packet Take 17 g by mouth daily. (Patient not taking: Reported on 01/01/2020) 14 each 0  . clonazePAM (KLONOPIN) 0.25 MG disintegrating tablet Take 1 tablet (0.25 mg total) by mouth 2 (two) times daily as needed (anxiety). (Patient not taking: Reported on 01/01/2020) 30 tablet 0  . esomeprazole (NEXIUM) 20 MG capsule Take 20 mg by mouth at bedtime. (Patient not taking: Reported on 01/01/2020)     No facility-administered medications prior to visit.    Allergies  Allergen Reactions  . Codeine Nausea Only    ROS Review of Systems  Constitutional: Negative for chills, fever and unexpected weight change.  Eyes: Negative for visual disturbance.  Respiratory: Negative for cough, chest tightness, shortness of breath and wheezing.   Cardiovascular: Negative for chest pain, palpitations and leg swelling.  Gastrointestinal: Negative for abdominal pain.  Neurological: Negative for dizziness, seizures, syncope, weakness, light-headedness and headaches.      Objective:    Physical Exam Vitals reviewed.  Constitutional:      Appearance: Normal appearance.  Cardiovascular:     Rate and Rhythm: Normal rate and regular rhythm.  Pulmonary:     Effort: Pulmonary effort is normal.     Breath sounds: Normal breath sounds.  Musculoskeletal:     Right lower leg: No edema.     Left lower leg: No edema.  Neurological:     Mental Status: She is alert.     BP 110/60 (BP  Location: Left Arm, Patient Position: Sitting, Cuff Size: Normal)   Pulse 88   Temp 97.9 F (36.6 C) (Oral)   Ht 5\' 8"  (1.727 m)   Wt 150 lb 14.4 oz (68.4 kg)   SpO2 99%   BMI 22.94 kg/m  Wt Readings from Last 3 Encounters:  01/01/20 150 lb 14.4 oz (68.4 kg)  12/07/19 158 lb 12.8 oz  (72 kg)  08/13/19 158 lb 11.7 oz (72 kg)     There are no preventive care reminders to display for this patient.  There are no preventive care reminders to display for this patient.  Lab Results  Component Value Date   TSH 2.49 12/07/2019   Lab Results  Component Value Date   WBC 11.8 (H) 08/24/2019   HGB 10.2 (L) 08/24/2019   HCT 31.6 (L) 08/24/2019   MCV 94.6 08/24/2019   PLT 328 08/24/2019   Lab Results  Component Value Date   NA 139 08/24/2019   K 4.6 08/24/2019   CO2 31 08/24/2019   GLUCOSE 115 (H) 08/24/2019   BUN 23 08/24/2019   CREATININE 0.82 08/24/2019   BILITOT 0.6 08/11/2019   ALKPHOS 66 08/11/2019   AST 26 08/11/2019   ALT 22 08/11/2019   PROT 5.7 (L) 08/11/2019   ALBUMIN 2.4 (L) 08/11/2019   CALCIUM 8.5 (L) 08/24/2019   ANIONGAP 8 08/24/2019   GFR 70.76 10/21/2018   Lab Results  Component Value Date   CHOL 206 (H) 10/21/2018   Lab Results  Component Value Date   HDL 45.30 10/21/2018   Lab Results  Component Value Date   LDLCALC 140 (H) 10/21/2018   Lab Results  Component Value Date   TRIG 100.0 10/21/2018   Lab Results  Component Value Date   CHOLHDL 5 10/21/2018   No results found for: HGBA1C    Assessment & Plan:   #1 hypertension-stable on Maxide -Refill Maxide for 1 year  #2 hypothyroidism treated with levothyroxine.  Stable by recent labs -Refill levothyroxine for 1 year  #3 postmenopausal hormone replacement therapy.  She has been for years on hormone replacement with estrogen.  She has had previous hysterectomy.  -We had a very long discussion regarding pros and cons of hormonal therapy.  We went over a table reviewing data looking at basically all the different various risk and benefits.  She is aware that she has slightly high risk of DVT and pulmonary emboli.  She does not smoke and other than back surgery last year stays very active. After long discussion of risk and benefit she is adamant that she would like to go back  on estrogen.  We did recommend lower dose.  We also discussed possible patch but she would like to go back on tablet.  She had been a 1.25 and we strongly advocate reducing the Estrace 0.5 mg once daily.  She is aware that she needs to continue getting yearly mammograms.   Meds ordered this encounter  Medications  . estradiol (ESTRACE) 0.5 MG tablet    Sig: Take 1 tablet (0.5 mg total) by mouth daily.    Dispense:  90 tablet    Refill:  3  . triamterene-hydrochlorothiazide (MAXZIDE) 75-50 MG tablet    Sig: Take 1 tablet by mouth daily with breakfast.    Dispense:  90 tablet    Refill:  3  . levothyroxine (SYNTHROID) 75 MCG tablet    Sig: Take one tablet by mouth each morning.    Dispense:  90 tablet  Refill:  3    Follow-up: No follow-ups on file.    Carolann Littler, MD

## 2020-01-05 DIAGNOSIS — M2560 Stiffness of unspecified joint, not elsewhere classified: Secondary | ICD-10-CM | POA: Diagnosis not present

## 2020-01-05 DIAGNOSIS — M5441 Lumbago with sciatica, right side: Secondary | ICD-10-CM | POA: Diagnosis not present

## 2020-01-05 DIAGNOSIS — M6281 Muscle weakness (generalized): Secondary | ICD-10-CM | POA: Diagnosis not present

## 2020-01-05 DIAGNOSIS — M5416 Radiculopathy, lumbar region: Secondary | ICD-10-CM | POA: Diagnosis not present

## 2020-01-07 DIAGNOSIS — M5441 Lumbago with sciatica, right side: Secondary | ICD-10-CM | POA: Diagnosis not present

## 2020-01-07 DIAGNOSIS — M6281 Muscle weakness (generalized): Secondary | ICD-10-CM | POA: Diagnosis not present

## 2020-01-07 DIAGNOSIS — M2560 Stiffness of unspecified joint, not elsewhere classified: Secondary | ICD-10-CM | POA: Diagnosis not present

## 2020-01-07 DIAGNOSIS — M5416 Radiculopathy, lumbar region: Secondary | ICD-10-CM | POA: Diagnosis not present

## 2020-01-15 DIAGNOSIS — M5441 Lumbago with sciatica, right side: Secondary | ICD-10-CM | POA: Diagnosis not present

## 2020-01-15 DIAGNOSIS — M2560 Stiffness of unspecified joint, not elsewhere classified: Secondary | ICD-10-CM | POA: Diagnosis not present

## 2020-01-15 DIAGNOSIS — M5416 Radiculopathy, lumbar region: Secondary | ICD-10-CM | POA: Diagnosis not present

## 2020-01-15 DIAGNOSIS — M6281 Muscle weakness (generalized): Secondary | ICD-10-CM | POA: Diagnosis not present

## 2020-01-19 DIAGNOSIS — M2560 Stiffness of unspecified joint, not elsewhere classified: Secondary | ICD-10-CM | POA: Diagnosis not present

## 2020-01-19 DIAGNOSIS — M5441 Lumbago with sciatica, right side: Secondary | ICD-10-CM | POA: Diagnosis not present

## 2020-01-19 DIAGNOSIS — M6281 Muscle weakness (generalized): Secondary | ICD-10-CM | POA: Diagnosis not present

## 2020-01-19 DIAGNOSIS — M5416 Radiculopathy, lumbar region: Secondary | ICD-10-CM | POA: Diagnosis not present

## 2020-01-26 DIAGNOSIS — M2560 Stiffness of unspecified joint, not elsewhere classified: Secondary | ICD-10-CM | POA: Diagnosis not present

## 2020-01-26 DIAGNOSIS — M6281 Muscle weakness (generalized): Secondary | ICD-10-CM | POA: Diagnosis not present

## 2020-01-26 DIAGNOSIS — M5441 Lumbago with sciatica, right side: Secondary | ICD-10-CM | POA: Diagnosis not present

## 2020-01-26 DIAGNOSIS — M5416 Radiculopathy, lumbar region: Secondary | ICD-10-CM | POA: Diagnosis not present

## 2020-01-28 DIAGNOSIS — M2560 Stiffness of unspecified joint, not elsewhere classified: Secondary | ICD-10-CM | POA: Diagnosis not present

## 2020-01-28 DIAGNOSIS — M6281 Muscle weakness (generalized): Secondary | ICD-10-CM | POA: Diagnosis not present

## 2020-01-28 DIAGNOSIS — M5441 Lumbago with sciatica, right side: Secondary | ICD-10-CM | POA: Diagnosis not present

## 2020-01-28 DIAGNOSIS — M5416 Radiculopathy, lumbar region: Secondary | ICD-10-CM | POA: Diagnosis not present

## 2020-02-01 DIAGNOSIS — M5416 Radiculopathy, lumbar region: Secondary | ICD-10-CM | POA: Diagnosis not present

## 2020-02-01 DIAGNOSIS — M438X9 Other specified deforming dorsopathies, site unspecified: Secondary | ICD-10-CM | POA: Diagnosis not present

## 2020-02-01 DIAGNOSIS — M4120 Other idiopathic scoliosis, site unspecified: Secondary | ICD-10-CM | POA: Diagnosis not present

## 2020-02-01 DIAGNOSIS — M545 Low back pain, unspecified: Secondary | ICD-10-CM | POA: Diagnosis not present

## 2020-02-01 DIAGNOSIS — M48062 Spinal stenosis, lumbar region with neurogenic claudication: Secondary | ICD-10-CM | POA: Diagnosis not present

## 2020-02-02 DIAGNOSIS — M5416 Radiculopathy, lumbar region: Secondary | ICD-10-CM | POA: Diagnosis not present

## 2020-02-02 DIAGNOSIS — M6281 Muscle weakness (generalized): Secondary | ICD-10-CM | POA: Diagnosis not present

## 2020-02-02 DIAGNOSIS — M5441 Lumbago with sciatica, right side: Secondary | ICD-10-CM | POA: Diagnosis not present

## 2020-02-02 DIAGNOSIS — M2560 Stiffness of unspecified joint, not elsewhere classified: Secondary | ICD-10-CM | POA: Diagnosis not present

## 2020-02-04 DIAGNOSIS — M5441 Lumbago with sciatica, right side: Secondary | ICD-10-CM | POA: Diagnosis not present

## 2020-02-04 DIAGNOSIS — M5416 Radiculopathy, lumbar region: Secondary | ICD-10-CM | POA: Diagnosis not present

## 2020-02-04 DIAGNOSIS — M2560 Stiffness of unspecified joint, not elsewhere classified: Secondary | ICD-10-CM | POA: Diagnosis not present

## 2020-02-04 DIAGNOSIS — M6281 Muscle weakness (generalized): Secondary | ICD-10-CM | POA: Diagnosis not present

## 2020-03-10 ENCOUNTER — Other Ambulatory Visit: Payer: Self-pay

## 2020-03-10 ENCOUNTER — Ambulatory Visit (INDEPENDENT_AMBULATORY_CARE_PROVIDER_SITE_OTHER): Payer: Medicare Other | Admitting: Otolaryngology

## 2020-03-10 VITALS — Temp 97.2°F

## 2020-03-10 DIAGNOSIS — K140 Glossitis: Secondary | ICD-10-CM | POA: Diagnosis not present

## 2020-03-10 DIAGNOSIS — Z85810 Personal history of malignant neoplasm of tongue: Secondary | ICD-10-CM | POA: Diagnosis not present

## 2020-03-10 NOTE — Progress Notes (Signed)
HPI: Stacy Wagner is a 80 y.o. female who returns today for evaluation of sore on the right side of her tongue that she has had for over a month now.  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  Since December she has had a chronic sore on the right lateral anterior tongue just anterior to the regularly previous excision was performed.  This has been sore for most of December..  Past Medical History:  Diagnosis Date  . AC (acromioclavicular) joint bone spurs   . Arthritis    "lower back" (01/15/2018)  . Back pain   . Bursitis disorder    bil shoulders  . Cataracts, bilateral   . Chronic lower back pain   . Complication of anesthesia    some nausea in past with anesthesia, pt. also recalls having the "tube removed too early & her her throat was full of mucous, states that she could move any part of her body)  . Family history of adverse reaction to anesthesia    "daughter gets PONV" (01/15/2018)  . GERD (gastroesophageal reflux disease)   . Hearing loss    bil / no hearing aids  . Hypertension   . Hypothyroidism   . Idiopathic scoliosis   . Leg pain   . Lumbar radiculopathy   . Lumbar stenosis with neurogenic claudication   . Migraine    "used to have them all the time; none in 10 yrs" (01/15/2018)  . Mouth sores   . Neck pain   . OA (osteoarthritis)    Multiple injections in lumbar region   . PONV (postoperative nausea and vomiting)   . Prolapse of female bladder, acquired   . Sagittal plane imbalance   . Skin cancer 11/2017   "left thigh"  . Thyroid disease   . Tongue cancer ([place])    12-2017   Past Surgical History:  Procedure Laterality Date  . ABDOMINAL EXPOSURE N/A 08/04/2019   Procedure: ABDOMINAL EXPOSURE;  Surgeon: Angelia Mould, MD;  Location: [facility] OR;  Service: Vascular;  Laterality: N/A;  . ABDOMINAL HYSTERECTOMY     uterine prolapse  . ABDOMINOPLASTY     tummy tuck Emilio Aspen  excess skin arms and legs  . ANTERIOR LATERAL LUMBAR FUSION 4 LEVELS Right 08/04/2019   Procedure: Lumbar One-Two, Lumbar Two-Three, Lumbar Three-Four, Lumbar Four-Five  Anterolateral Lumbar Interbody Fusion;  Surgeon: Erline Levine, MD;  Location: [place];  Service: Neurosurgery;  Laterality: Right;  . ANTERIOR LUMBAR FUSION N/A 08/04/2019   Procedure: Lumbar Five - Sacral One  Anterior Lumbar Interbody Fusion;  Surgeon: Erline Levine, MD;  Location: [place];  Service: Neurosurgery;  Laterality: N/A;  Dr Deitra Mayo to assist  . APPENDECTOMY    . APPLICATION OF INTRAOPERATIVE CT SCAN N/A 08/07/2019   Procedure: APPLICATION OF INTRAOPERATIVE CT SCAN;  Surgeon: Erline Levine, MD;  Location: [place];  Service: Neurosurgery;  Laterality: N/A;  . BACK SURGERY    . BREAST SURGERY Right 1980   removal of a nodule- post trauma, benign pathology  . CARPAL TUNNEL RELEASE Right   . CATARACT EXTRACTION, BILATERAL    . COLONOSCOPY    . GLOSSECTOMY N/A 01/15/2018   Procedure: PARTIAL GLOSSECTOMY;  Surgeon: Rozetta Nunnery, MD;  Location: [facility] OR;  Service: ENT;  Laterality: N/A;  . LUMBAR SPINE SURGERY  1988   /w Dr. Joya Salm- reports that it was in the lumbar region, unsure of  date.  Marland Kitchen NASAL POLYP EXCISION    . PARTIAL GLOSSECTOMY  01/15/2018  . POSTERIOR LUMBAR FUSION 4 LEVEL N/A 08/07/2019   Procedure: Thoracic Ten to pelvis fixation;  Surgeon: Erline Levine, MD;  Location: [place];  Service: Neurosurgery;  Laterality: N/A;  Thoracic Ten to pelvis fixation  . SKIN CANCER EXCISION Left 11/2017   "thigh"  . TONGUE SURGERY    . TONSILLECTOMY    . TUMMY TUCK     Social History   Socioeconomic History  . Marital status: Married    Spouse name: Not on file  . Number of children: Not on file  . Years of education: Not on file  . Highest education level: Not on file  Occupational History  . Occupation: RETIRED  Tobacco Use  . Smoking status: Never Smoker  . Smokeless tobacco: Never Used  Vaping  Use  . Vaping Use: Never used  Substance and Sexual Activity  . Alcohol use: Not Currently    Alcohol/week: 0.0 standard drinks  . Drug use: Never  . Sexual activity: Not Currently    Partners: Male  Other Topics Concern  . Not on file  Social History Narrative  . Not on file   Social Determinants of Health   Financial Resource Strain: Not on file  Food Insecurity: Not on file  Transportation Needs: Not on file  Physical Activity: Not on file  Stress: Not on file  Social Connections: Not on file   Family History  Problem Relation Age of Onset  . Cancer Mother        ?lung cancer  . Hypertension Sister   . Diabetes Father   . Stomach cancer Cousin   . Rectal cancer Neg Hx   . Esophageal cancer Neg Hx   . Colon cancer Neg Hx    Allergies  Allergen Reactions  . Codeine Nausea Only   Prior to Admission medications   Medication Sig Start Date End Date Taking? Authorizing Provider  acetaminophen (TYLENOL) 325 MG tablet Take 1-2 tablets (325-650 mg total) by mouth every 4 (four) hours as needed for mild pain. 08/25/19   Love, Ivan Anchors, PA-C  Ascorbic Acid (VITAMIN C PO) Take 1 tablet by mouth daily.     [provider]  b complex vitamins tablet Take 1 tablet by mouth daily.    [provider]  calcium citrate (CALCITRATE - DOSED IN MG ELEMENTAL CALCIUM) 950 (200 Ca) MG tablet Take 1 tablet (200 mg of elemental calcium total) by mouth daily. 08/26/19   Love, Ivan Anchors, PA-C  diphenhydrAMINE (BENADRYL) 25 MG tablet Take 25 mg by mouth at bedtime as needed for sleep.    [provider]  estradiol (ESTRACE) 0.5 MG tablet Take 1 tablet (0.5 mg total) by mouth daily. 01/01/20   Burchette, Alinda Sierras, MD  levothyroxine (SYNTHROID) 75 MCG tablet Take one tablet by mouth each morning. 01/01/20   Burchette, Alinda Sierras, MD  Menthol-Methyl Salicylate (MUSCLE RUB) 10-15 % CREA Apply 1 application topically as needed for muscle pain. To both legs as needed 08/25/19   Love,  Ivan Anchors, PA-C  methocarbamol (ROBAXIN) 750 MG tablet Take 1 tablet (750 mg total) by mouth every 6 (six) hours as needed for muscle spasms. 08/26/19   Angiulli, Lavon Paganini, PA-C  Multiple Vitamins-Minerals (HAIR/SKIN/NAILS/BIOTIN PO) Take 1 tablet by mouth daily.    [provider]  Multiple Vitamins-Minerals (MULTIVITAMIN WITH MINERALS) tablet Take 1 tablet by mouth daily.    [provider]  Multiple Vitamins-Minerals (OSTEO COMPLEX PO) Take 1 tablet by mouth daily.     [provider]  polyethylene glycol (MIRALAX / GLYCOLAX) 17 g packet Take 17 g by mouth daily. Patient not taking: Reported on 01/01/2020 08/26/19   Angiulli, Lavon Paganini, PA-C  triamcinolone cream (KENALOG) 0.1 % Apply 1 application topically 2 (two) times daily as needed (irritation).  07/06/19   [provider]  triamterene-hydrochlorothiazide (MAXZIDE) 75-50 MG tablet Take 1 tablet by mouth daily with breakfast. 01/01/20 01/30/21  Burchette, Alinda Sierras, MD  VITAMIN D PO Take 1 tablet by mouth daily.     [provider]     Positive ROS: Otherwise negative  All other systems have been reviewed and were otherwise negative with the exception of those mentioned in the HPI and as above.  Physical Exam: Constitutional: Alert, well-appearing, no acute distress Ears: External ears without lesions or tenderness. Ear canals are clear bilaterally with intact, clear TMs.  Nasal: External nose without lesions.. Clear nasal passages Oral: Lips and gums without lesions.  Oropharynx is clear patient is status post tonsillectomy.  [place] mucosa was normal.  Palate mucosa was normal.  On examination of the tongue she has a very superficial small ulceration anterior right lateral tongue which appears to be more of a traumatic ulceration than neoplastic ulceration.  The tongue in this area lies adjacent to a very prominent canine tooth on the right lower mandible that appears to be causing trauma to the tongue  as the small sore ulceration is adjacent to this tooth.  Palpation of this area is minimally tender with slightly thickened mucosa but no gross evidence of neoplasia. Neck: No palpable adenopathy or masses.  No submandibular adenopathy noted.  No right neck adenopathy noted. Respiratory: Breathing comfortably  Skin: No facial/neck lesions or rash noted.  Procedures  Assessment: Right tongue sore seems to be more of a traumatic lesion versus a neoplastic lesion.  I think it is caused by the prominent right lower canine tooth.  Plan: Suggested initially follow-up with her dentist to see if there is anything he can do to help with this.  Could consider wearing a mouthguard. If the ulcer persists over 6 weeks or the dentist feels like this needs to be biopsied she will follow-up as needed.  Otherwise she will have a scheduled follow-up appointment in April.   Radene Journey, MD

## 2020-05-19 DIAGNOSIS — M545 Low back pain, unspecified: Secondary | ICD-10-CM | POA: Diagnosis not present

## 2020-05-19 DIAGNOSIS — M5416 Radiculopathy, lumbar region: Secondary | ICD-10-CM | POA: Diagnosis not present

## 2020-05-19 DIAGNOSIS — M48062 Spinal stenosis, lumbar region with neurogenic claudication: Secondary | ICD-10-CM | POA: Diagnosis not present

## 2020-05-19 DIAGNOSIS — M438X9 Other specified deforming dorsopathies, site unspecified: Secondary | ICD-10-CM | POA: Diagnosis not present

## 2020-06-03 DIAGNOSIS — M545 Low back pain, unspecified: Secondary | ICD-10-CM | POA: Diagnosis not present

## 2020-06-07 DIAGNOSIS — M545 Low back pain, unspecified: Secondary | ICD-10-CM | POA: Diagnosis not present

## 2020-06-09 ENCOUNTER — Ambulatory Visit (INDEPENDENT_AMBULATORY_CARE_PROVIDER_SITE_OTHER): Payer: Medicare Other | Admitting: Otolaryngology

## 2020-06-09 ENCOUNTER — Other Ambulatory Visit (HOSPITAL_COMMUNITY)
Admission: RE | Admit: 2020-06-09 | Discharge: 2020-06-09 | Disposition: A | Discharge status: Home / Self Care | Payer: Medicare Other | Source: Ambulatory Visit | Attending: Otolaryngology | Admitting: Otolaryngology

## 2020-06-09 ENCOUNTER — Other Ambulatory Visit: Payer: Self-pay

## 2020-06-09 VITALS — Temp 96.3°F

## 2020-06-09 DIAGNOSIS — C029 Malignant neoplasm of tongue, unspecified: Secondary | ICD-10-CM | POA: Diagnosis not present

## 2020-06-09 DIAGNOSIS — Z85810 Personal history of malignant neoplasm of tongue: Secondary | ICD-10-CM | POA: Diagnosis not present

## 2020-06-09 DIAGNOSIS — C021 Malignant neoplasm of border of tongue: Secondary | ICD-10-CM | POA: Diagnosis not present

## 2020-06-09 DIAGNOSIS — Z800 Family history of malignant neoplasm of digestive organs: Secondary | ICD-10-CM | POA: Diagnosis not present

## 2020-06-09 DIAGNOSIS — D3702 Neoplasm of uncertain behavior of tongue: Secondary | ICD-10-CM | POA: Diagnosis not present

## 2020-06-09 NOTE — Progress Notes (Signed)
HPI: Stacy Wagner is a 80 y.o. female who returns today for evaluation of right anterior lateral tongue sore has been present for last couple months despite having the dentition filed down.  She is status post excision of a right lateral T1 N0 squamous cell carcinoma performed in November 2019.  She has done well up until last few months where she has had a little sore on the right lateral tongue.  On previous examination this almost appeared to be traumatic secondary to dentition.  But she has had this worked on she still has a little sore on the right lateral tongue.  Past Medical History:  Diagnosis Date  . AC (acromioclavicular) joint bone spurs   . Arthritis    "lower back" (01/15/2018)  . Back pain   . Bursitis disorder    bil shoulders  . Cataracts, bilateral   . Chronic lower back pain   . Complication of anesthesia    some nausea in past with anesthesia, pt. also recalls having the "tube removed too early & her her throat was full of mucous, states that she could move any part of her body)  . Family history of adverse reaction to anesthesia    "daughter gets PONV" (01/15/2018)  . GERD (gastroesophageal reflux disease)   . Hearing loss    bil / no hearing aids  . Hypertension   . Hypothyroidism   . Idiopathic scoliosis   . Leg pain   . Lumbar radiculopathy   . Lumbar stenosis with neurogenic claudication   . Migraine    "used to have them all the time; none in 10 yrs" (01/15/2018)  . Mouth sores   . Neck pain   . OA (osteoarthritis)    Multiple injections in lumbar region   . PONV (postoperative nausea and vomiting)   . Prolapse of female bladder, acquired   . Sagittal plane imbalance   . Skin cancer 11/2017   "left thigh"  . Thyroid disease   . Tongue cancer ([place])    12-2017   Past Surgical History:  Procedure Laterality Date  . ABDOMINAL EXPOSURE N/A 08/04/2019   Procedure: ABDOMINAL EXPOSURE;  Surgeon: Angelia Mould, MD;  Location: [facility] OR;  Service:  Vascular;  Laterality: N/A;  . ABDOMINAL HYSTERECTOMY     uterine prolapse  . ABDOMINOPLASTY     tummy tuck Emilio Aspen excess skin arms and legs  . ANTERIOR LATERAL LUMBAR FUSION 4 LEVELS Right 08/04/2019   Procedure: Lumbar One-Two, Lumbar Two-Three, Lumbar Three-Four, Lumbar Four-Five  Anterolateral Lumbar Interbody Fusion;  Surgeon: Erline Levine, MD;  Location: [place];  Service: Neurosurgery;  Laterality: Right;  . ANTERIOR LUMBAR FUSION N/A 08/04/2019   Procedure: Lumbar Five - Sacral One  Anterior Lumbar Interbody Fusion;  Surgeon: Erline Levine, MD;  Location: [place];  Service: Neurosurgery;  Laterality: N/A;  Dr Deitra Mayo to assist  . APPENDECTOMY    . APPLICATION OF INTRAOPERATIVE CT SCAN N/A 08/07/2019   Procedure: APPLICATION OF INTRAOPERATIVE CT SCAN;  Surgeon: Erline Levine, MD;  Location: [place];  Service: Neurosurgery;  Laterality: N/A;  . BACK SURGERY    . BREAST SURGERY Right 1980   removal of a nodule- post trauma, benign pathology  . CARPAL TUNNEL RELEASE Right   . CATARACT EXTRACTION, BILATERAL    . COLONOSCOPY    . GLOSSECTOMY N/A 01/15/2018   Procedure: PARTIAL GLOSSECTOMY;  Surgeon: Rozetta Nunnery, MD;  Location: [place];  Service: ENT;  Laterality: N/A;  . LUMBAR  SPINE SURGERY  1988   /w Dr. Joya Salm- reports that it was in the lumbar region, unsure of date.  Marland Kitchen NASAL POLYP EXCISION    . PARTIAL GLOSSECTOMY  01/15/2018  . POSTERIOR LUMBAR FUSION 4 LEVEL N/A 08/07/2019   Procedure: Thoracic Ten to pelvis fixation;  Surgeon: Erline Levine, MD;  Location: [place];  Service: Neurosurgery;  Laterality: N/A;  Thoracic Ten to pelvis fixation  . SKIN CANCER EXCISION Left 11/2017   "thigh"  . TONGUE SURGERY    . TONSILLECTOMY    . TUMMY TUCK     Social History   Socioeconomic History  . Marital status: Married    Spouse name: Not on file  . Number of children: Not on file  . Years of education: Not on file  . Highest education level: Not on file  Occupational  History  . Occupation: RETIRED  Tobacco Use  . Smoking status: Never Smoker  . Smokeless tobacco: Never Used  Vaping Use  . Vaping Use: Never used  Substance and Sexual Activity  . Alcohol use: Not Currently    Alcohol/week: 0.0 standard drinks  . Drug use: Never  . Sexual activity: Not Currently    Partners: Male  Other Topics Concern  . Not on file  Social History Narrative  . Not on file   Social Determinants of Health   Financial Resource Strain: Not on file  Food Insecurity: Not on file  Transportation Needs: Not on file  Physical Activity: Not on file  Stress: Not on file  Social Connections: Not on file   Family History  Problem Relation Age of Onset  . Cancer Mother        ?lung cancer  . Hypertension Sister   . Diabetes Father   . Stomach cancer Cousin   . Rectal cancer Neg Hx   . Esophageal cancer Neg Hx   . Colon cancer Neg Hx    Allergies  Allergen Reactions  . Codeine Nausea Only   Prior to Admission medications   Medication Sig Start Date End Date Taking? Authorizing Provider  acetaminophen (TYLENOL) 325 MG tablet Take 1-2 tablets (325-650 mg total) by mouth every 4 (four) hours as needed for mild pain. 08/25/19   Love, Ivan Anchors, PA-C  Ascorbic Acid (VITAMIN C PO) Take 1 tablet by mouth daily.     [provider]  b complex vitamins tablet Take 1 tablet by mouth daily.    [provider]  calcium citrate (CALCITRATE - DOSED IN MG ELEMENTAL CALCIUM) 950 (200 Ca) MG tablet Take 1 tablet (200 mg of elemental calcium total) by mouth daily. 08/26/19   Love, Ivan Anchors, PA-C  diphenhydrAMINE (BENADRYL) 25 MG tablet Take 25 mg by mouth at bedtime as needed for sleep.    [provider]  estradiol (ESTRACE) 0.5 MG tablet Take 1 tablet (0.5 mg total) by mouth daily. 01/01/20   Burchette, Alinda Sierras, MD  levothyroxine (SYNTHROID) 75 MCG tablet Take one tablet by mouth each morning. 01/01/20   Burchette, Alinda Sierras, MD  Menthol-Methyl Salicylate  (MUSCLE RUB) 10-15 % CREA Apply 1 application topically as needed for muscle pain. To both legs as needed 08/25/19   Love, Ivan Anchors, PA-C  methocarbamol (ROBAXIN) 750 MG tablet Take 1 tablet (750 mg total) by mouth every 6 (six) hours as needed for muscle spasms. 08/26/19   Angiulli, Lavon Paganini, PA-C  Multiple Vitamins-Minerals (HAIR/SKIN/NAILS/BIOTIN PO) Take 1 tablet by mouth daily.    [provider]  Multiple Vitamins-Minerals (MULTIVITAMIN WITH MINERALS) tablet Take 1 tablet by mouth daily.    [provider]  Multiple Vitamins-Minerals (OSTEO COMPLEX PO) Take 1 tablet by mouth daily.     [provider]  polyethylene glycol (MIRALAX / GLYCOLAX) 17 g packet Take 17 g by mouth daily. Patient not taking: Reported on 01/01/2020 08/26/19   Angiulli, Lavon Paganini, PA-C  triamcinolone cream (KENALOG) 0.1 % Apply 1 application topically 2 (two) times daily as needed (irritation).  07/06/19   [provider]  triamterene-hydrochlorothiazide (MAXZIDE) 75-50 MG tablet Take 1 tablet by mouth daily with breakfast. 01/01/20 01/30/21  Burchette, Alinda Sierras, MD  VITAMIN D PO Take 1 tablet by mouth daily.     [provider]     Positive ROS: Otherwise negative  All other systems have been reviewed and were otherwise negative with the exception of those mentioned in the HPI and as above.  Physical Exam: Constitutional: Alert, well-appearing, no acute distress Ears: External ears without lesions or tenderness. Ear canals are clear bilaterally with intact, clear TMs.  Nasal: External nose without lesions. Septum with mild deformity. Clear nasal passages otherwise. Oral: Lips and gums without lesions. Tongue and palate mucosa without lesions. Posterior oropharynx clear.  On examination of the tongue she has a well-healed scar from previous excision of a T1 cancer.  Just anterior to the scar however there is an area of leukoplakia that is a little bit firm to palpation.  This is  concerning for dysplasia or possible carcinoma.  Measures approximately 4 x 8 mm in size..  This is the region of her slight discomfort.  Remaining tongue is normal to examination is soft to palpation. Neck: No palpable adenopathy or masses Respiratory: Breathing comfortably  Skin: No facial/neck lesions or rash noted.  Procedure Biopsy of right anterior lateral tongue lesion The area was injected with 1 cc of 1% Xylocaine with epinephrine. Been using a 15 blade the area was elliptically excised and removed with scissors sharply.  This was sent to pathology.  Hemostasis of the small defect was obtained with silver nitrate and pressure. She tolerated this well.  Procedures  Assessment: Biopsy of right lateral tongue leukoplakia concerning for recurrent cancer or dysplasia.  Plan: A biopsy of this area was obtained in the office today. She will call us back in 4 days concerning results of the biopsy.  I discussed with her that if this is positive would require larger reexcision of this area in the OR.  But I still think this could probably be performed under local anesthesia or general.   Radene Journey, MD

## 2020-06-13 DIAGNOSIS — M545 Low back pain, unspecified: Secondary | ICD-10-CM | POA: Diagnosis not present

## 2020-06-14 ENCOUNTER — Other Ambulatory Visit: Payer: Self-pay

## 2020-06-14 ENCOUNTER — Encounter (HOSPITAL_BASED_OUTPATIENT_CLINIC_OR_DEPARTMENT_OTHER): Payer: Self-pay | Admitting: Otolaryngology

## 2020-06-14 LAB — SURGICAL PATHOLOGY

## 2020-06-15 ENCOUNTER — Ambulatory Visit (INDEPENDENT_AMBULATORY_CARE_PROVIDER_SITE_OTHER): Payer: Self-pay | Admitting: Otolaryngology

## 2020-06-15 DIAGNOSIS — C029 Malignant neoplasm of tongue, unspecified: Secondary | ICD-10-CM

## 2020-06-15 NOTE — H&P (View-Only) (Signed)
PREOPERATIVE H&P  Chief Complaint: Right lateral tongue cancer  HPI: Stacy Wagner is a 80 y.o. female who presents for evaluation of right lateral tongue cancer.  Patient had initial resection of a tongue cancer performed in November 2019 on the right lateral tongue.  More recently has developed a another small nodule on the right no tongue that was less than 1 cm in size and biopsy in the office revealed squamous cell carcinoma.  She is taken to the operating room at this time for wide local excision of the biopsy site.  Past Medical History:  Diagnosis Date  . AC (acromioclavicular) joint bone spurs   . Arthritis    "lower back" (01/15/2018)  . Back pain   . Bursitis disorder    bil shoulders  . Cataracts, bilateral   . Chronic lower back pain   . Complication of anesthesia    some nausea in past with anesthesia, pt. also recalls having the "tube removed too early & her her throat was full of mucous, states that she could move any part of her body)  . Family history of adverse reaction to anesthesia    "daughter gets PONV" (01/15/2018)  . GERD (gastroesophageal reflux disease)   . Hearing loss    bil / no hearing aids  . Hypertension   . Hypothyroidism   . Idiopathic scoliosis   . Leg pain   . Lumbar radiculopathy   . Lumbar stenosis with neurogenic claudication   . Migraine    "used to have them all the time; none in 10 yrs" (01/15/2018)  . Mouth sores   . Neck pain   . OA (osteoarthritis)    Multiple injections in lumbar region   . PONV (postoperative nausea and vomiting)   . Prolapse of female bladder, acquired   . Sagittal plane imbalance   . Skin cancer 11/2017   "left thigh"  . Thyroid disease   . Tongue cancer ([place])    12-2017   Past Surgical History:  Procedure Laterality Date  . ABDOMINAL EXPOSURE N/A 08/04/2019   Procedure: ABDOMINAL EXPOSURE;  Surgeon: Angelia Mould, MD;  Location: [facility] OR;  Service: Vascular;  Laterality: N/A;  . ABDOMINAL  HYSTERECTOMY     uterine prolapse  . ABDOMINOPLASTY     tummy tuck Emilio Aspen excess skin arms and legs  . ANTERIOR LATERAL LUMBAR FUSION 4 LEVELS Right 08/04/2019   Procedure: Lumbar One-Two, Lumbar Two-Three, Lumbar Three-Four, Lumbar Four-Five  Anterolateral Lumbar Interbody Fusion;  Surgeon: Erline Levine, MD;  Location: [place];  Service: Neurosurgery;  Laterality: Right;  . ANTERIOR LUMBAR FUSION N/A 08/04/2019   Procedure: Lumbar Five - Sacral One  Anterior Lumbar Interbody Fusion;  Surgeon: Erline Levine, MD;  Location: [place];  Service: Neurosurgery;  Laterality: N/A;  Dr Deitra Mayo to assist  . APPENDECTOMY    . APPLICATION OF INTRAOPERATIVE CT SCAN N/A 08/07/2019   Procedure: APPLICATION OF INTRAOPERATIVE CT SCAN;  Surgeon: Erline Levine, MD;  Location: [place];  Service: Neurosurgery;  Laterality: N/A;  . BACK SURGERY    . BREAST SURGERY Right 1980   removal of a nodule- post trauma, benign pathology  . CARPAL TUNNEL RELEASE Right   . CATARACT EXTRACTION, BILATERAL    . COLONOSCOPY    . GLOSSECTOMY N/A 01/15/2018   Procedure: PARTIAL GLOSSECTOMY;  Surgeon: Rozetta Nunnery, MD;  Location: [facility] OR;  Service: ENT;  Laterality: N/A;  . LUMBAR SPINE SURGERY  1988   /w Dr. Joya Salm- reports  that it was in the lumbar region, unsure of date.  Marland Kitchen NASAL POLYP EXCISION    . PARTIAL GLOSSECTOMY  01/15/2018  . POSTERIOR LUMBAR FUSION 4 LEVEL N/A 08/07/2019   Procedure: Thoracic Ten to pelvis fixation;  Surgeon: Erline Levine, MD;  Location: [place];  Service: Neurosurgery;  Laterality: N/A;  Thoracic Ten to pelvis fixation  . SKIN CANCER EXCISION Left 11/2017   "thigh"  . TONGUE SURGERY    . TONSILLECTOMY    . TUMMY TUCK     Social History   Socioeconomic History  . Marital status: Married    Spouse name: Not on file  . Number of children: Not on file  . Years of education: Not on file  . Highest education level: Not on file  Occupational History  . Occupation: RETIRED  Tobacco Use   . Smoking status: Never Smoker  . Smokeless tobacco: Never Used  Vaping Use  . Vaping Use: Never used  Substance and Sexual Activity  . Alcohol use: Not Currently    Alcohol/week: 0.0 standard drinks  . Drug use: Never  . Sexual activity: Not Currently    Partners: Male    Birth control/protection: Surgical  Other Topics Concern  . Not on file  Social History Narrative  . Not on file   Social Determinants of Health   Financial Resource Strain: Not on file  Food Insecurity: Not on file  Transportation Needs: Not on file  Physical Activity: Not on file  Stress: Not on file  Social Connections: Not on file   Family History  Problem Relation Age of Onset  . Cancer Mother        ?lung cancer  . Hypertension Sister   . Diabetes Father   . Stomach cancer Cousin   . Rectal cancer Neg Hx   . Esophageal cancer Neg Hx   . Colon cancer Neg Hx    Allergies  Allergen Reactions  . Codeine Nausea Only   Prior to Admission medications   Medication Sig Start Date End Date Taking? Authorizing Provider  acetaminophen (TYLENOL) 325 MG tablet Take 1-2 tablets (325-650 mg total) by mouth every 4 (four) hours as needed for mild pain. 08/25/19   Love, Ivan Anchors, PA-C  Ascorbic Acid (VITAMIN C PO) Take 1 tablet by mouth daily.     [provider]  b complex vitamins tablet Take 1 tablet by mouth daily.    [provider]  calcium citrate (CALCITRATE - DOSED IN MG ELEMENTAL CALCIUM) 950 (200 Ca) MG tablet Take 1 tablet (200 mg of elemental calcium total) by mouth daily. 08/26/19   Love, Ivan Anchors, PA-C  diphenhydrAMINE (BENADRYL) 25 MG tablet Take 25 mg by mouth at bedtime as needed for sleep.    [provider]  estradiol (ESTRACE) 0.5 MG tablet Take 1 tablet (0.5 mg total) by mouth daily. 01/01/20   Burchette, Alinda Sierras, MD  levothyroxine (SYNTHROID) 75 MCG tablet Take one tablet by mouth each morning. 01/01/20   Burchette, Alinda Sierras, MD  Multiple Vitamins-Minerals  (HAIR/SKIN/NAILS/BIOTIN PO) Take 1 tablet by mouth daily.    [provider]  Multiple Vitamins-Minerals (MULTIVITAMIN WITH MINERALS) tablet Take 1 tablet by mouth daily.    [provider]  Multiple Vitamins-Minerals (OSTEO COMPLEX PO) Take 1 tablet by mouth daily.     [provider]  triamcinolone cream (KENALOG) 0.1 % Apply 1 application topically 2 (two) times daily as needed (irritation).  07/06/19   [provider]  triamterene-hydrochlorothiazide (MAXZIDE) 75-50 MG tablet Take 1 tablet by mouth daily with breakfast. 01/01/20 01/30/21  Burchette, Alinda Sierras, MD  VITAMIN D PO Take 1 tablet by mouth daily.     [provider]     Positive ROS: Otherwise negative  All other systems have been reviewed and were otherwise negative with the exception of those mentioned in the HPI and as above.  Physical Exam: There were no vitals filed for this visit.  General: Alert, no acute distress Oral: Normal oral mucosa.  On examination of the tongue she has had previous partial glossectomy and just anterior to her previous excision site she has a small 8 mm region that have been biopsied that demonstrated squamous cell carcinoma that extended to the margins. Nasal: Clear nasal passages Neck: No palpable adenopathy or thyroid nodules.  No palpable adenopathy in the neck on either side Ear: Ear canal is clear with normal appearing TMs Cardiovascular: Regular rate and rhythm, no murmur.  Respiratory: Clear to auscultation Neurologic: Alert and oriented x 3   Assessment/Plan: T1 squamous cell carcinoma of the right lateral tongue.  Plan for wide excision of previous biopsy site.  We will plan this under local anesthesia.   [name], MD 06/15/2020 1:27 PM

## 2020-06-15 NOTE — H&P (Signed)
PREOPERATIVE H&P  Chief Complaint: Right lateral tongue cancer  HPI: Stacy Wagner is a 80 y.o. female who presents for evaluation of right lateral tongue cancer.  Patient had initial resection of a tongue cancer performed in November 2019 on the right lateral tongue.  More recently has developed a another small nodule on the right no tongue that was less than 1 cm in size and biopsy in the office revealed squamous cell carcinoma.  She is taken to the operating room at this time for wide local excision of the biopsy site.  Past Medical History:  Diagnosis Date  . AC (acromioclavicular) joint bone spurs   . Arthritis    "lower back" (01/15/2018)  . Back pain   . Bursitis disorder    bil shoulders  . Cataracts, bilateral   . Chronic lower back pain   . Complication of anesthesia    some nausea in past with anesthesia, pt. also recalls having the "tube removed too early & her her throat was full of mucous, states that she could move any part of her body)  . Family history of adverse reaction to anesthesia    "daughter gets PONV" (01/15/2018)  . GERD (gastroesophageal reflux disease)   . Hearing loss    bil / no hearing aids  . Hypertension   . Hypothyroidism   . Idiopathic scoliosis   . Leg pain   . Lumbar radiculopathy   . Lumbar stenosis with neurogenic claudication   . Migraine    "used to have them all the time; none in 10 yrs" (01/15/2018)  . Mouth sores   . Neck pain   . OA (osteoarthritis)    Multiple injections in lumbar region   . PONV (postoperative nausea and vomiting)   . Prolapse of female bladder, acquired   . Sagittal plane imbalance   . Skin cancer 11/2017   "left thigh"  . Thyroid disease   . Tongue cancer ([place])    12-2017   Past Surgical History:  Procedure Laterality Date  . ABDOMINAL EXPOSURE N/A 08/04/2019   Procedure: ABDOMINAL EXPOSURE;  Surgeon: Angelia Mould, MD;  [facility]: [facility] OR;  Service: Vascular;  Laterality: N/A;  . ABDOMINAL  HYSTERECTOMY     uterine prolapse  . ABDOMINOPLASTY     tummy tuck Emilio Aspen excess skin arms and legs  . ANTERIOR LATERAL LUMBAR FUSION 4 LEVELS Right 08/04/2019   Procedure: Lumbar One-Two, Lumbar Two-Three, Lumbar Three-Four, Lumbar Four-Five  Anterolateral Lumbar Interbody Fusion;  Surgeon: Erline Levine, MD;  Location: [place];  Service: Neurosurgery;  Laterality: Right;  . ANTERIOR LUMBAR FUSION N/A 08/04/2019   Procedure: Lumbar Five - Sacral One  Anterior Lumbar Interbody Fusion;  Surgeon: Erline Levine, MD;  Location: [place];  Service: Neurosurgery;  Laterality: N/A;  Dr Deitra Mayo to assist  . APPENDECTOMY    . APPLICATION OF INTRAOPERATIVE CT SCAN N/A 08/07/2019   Procedure: APPLICATION OF INTRAOPERATIVE CT SCAN;  Surgeon: Erline Levine, MD;  Location: [place];  Service: Neurosurgery;  Laterality: N/A;  . BACK SURGERY    . BREAST SURGERY Right 1980   removal of a nodule- post trauma, benign pathology  . CARPAL TUNNEL RELEASE Right   . CATARACT EXTRACTION, BILATERAL    . COLONOSCOPY    . GLOSSECTOMY N/A 01/15/2018   Procedure: PARTIAL GLOSSECTOMY;  Surgeon: Rozetta Nunnery, MD;  Location: [facility] OR;  Service: ENT;  Laterality: N/A;  . LUMBAR SPINE SURGERY  1988   /w Dr. Joya Salm- reports  that it was in the lumbar region, unsure of date.  Marland Kitchen NASAL POLYP EXCISION    . PARTIAL GLOSSECTOMY  01/15/2018  . POSTERIOR LUMBAR FUSION 4 LEVEL N/A 08/07/2019   Procedure: Thoracic Ten to pelvis fixation;  Surgeon: Erline Levine, MD;  Location: [place];  Service: Neurosurgery;  Laterality: N/A;  Thoracic Ten to pelvis fixation  . SKIN CANCER EXCISION Left 11/2017   "thigh"  . TONGUE SURGERY    . TONSILLECTOMY    . TUMMY TUCK     Social History   Socioeconomic History  . Marital status: Married    Spouse name: Not on file  . Number of children: Not on file  . Years of education: Not on file  . Highest education level: Not on file  Occupational History  . Occupation: RETIRED  Tobacco Use   . Smoking status: Never Smoker  . Smokeless tobacco: Never Used  Vaping Use  . Vaping Use: Never used  Substance and Sexual Activity  . Alcohol use: Not Currently    Alcohol/week: 0.0 standard drinks  . Drug use: Never  . Sexual activity: Not Currently    Partners: Male    Birth control/protection: Surgical  Other Topics Concern  . Not on file  Social History Narrative  . Not on file   Social Determinants of Health   Financial Resource Strain: Not on file  Food Insecurity: Not on file  Transportation Needs: Not on file  Physical Activity: Not on file  Stress: Not on file  Social Connections: Not on file   Family History  Problem Relation Age of Onset  . Cancer Mother        ?lung cancer  . Hypertension Sister   . Diabetes Father   . Stomach cancer Cousin   . Rectal cancer Neg Hx   . Esophageal cancer Neg Hx   . Colon cancer Neg Hx    Allergies  Allergen Reactions  . Codeine Nausea Only   Prior to Admission medications   Medication Sig Start Date End Date Taking? Authorizing Provider  acetaminophen (TYLENOL) 325 MG tablet Take 1-2 tablets (325-650 mg total) by mouth every 4 (four) hours as needed for mild pain. 08/25/19   Love, Ivan Anchors, PA-C  Ascorbic Acid (VITAMIN C PO) Take 1 tablet by mouth daily.     [provider]  b complex vitamins tablet Take 1 tablet by mouth daily.    [provider]  calcium citrate (CALCITRATE - DOSED IN MG ELEMENTAL CALCIUM) 950 (200 Ca) MG tablet Take 1 tablet (200 mg of elemental calcium total) by mouth daily. 08/26/19   Love, Ivan Anchors, PA-C  diphenhydrAMINE (BENADRYL) 25 MG tablet Take 25 mg by mouth at bedtime as needed for sleep.    [provider]  estradiol (ESTRACE) 0.5 MG tablet Take 1 tablet (0.5 mg total) by mouth daily. 01/01/20   Burchette, Alinda Sierras, MD  levothyroxine (SYNTHROID) 75 MCG tablet Take one tablet by mouth each morning. 01/01/20   Burchette, Alinda Sierras, MD  Multiple Vitamins-Minerals  (HAIR/SKIN/NAILS/BIOTIN PO) Take 1 tablet by mouth daily.    [provider]  Multiple Vitamins-Minerals (MULTIVITAMIN WITH MINERALS) tablet Take 1 tablet by mouth daily.    [provider]  Multiple Vitamins-Minerals (OSTEO COMPLEX PO) Take 1 tablet by mouth daily.     [provider]  triamcinolone cream (KENALOG) 0.1 % Apply 1 application topically 2 (two) times daily as needed (irritation).  07/06/19   [provider]  triamterene-hydrochlorothiazide (MAXZIDE) 75-50 MG tablet Take 1 tablet by mouth daily with breakfast. 01/01/20 01/30/21  Burchette, Alinda Sierras, MD  VITAMIN D PO Take 1 tablet by mouth daily.     [provider]     Positive ROS: Otherwise negative  All other systems have been reviewed and were otherwise negative with the exception of those mentioned in the HPI and as above.  Physical Exam: There were no vitals filed for this visit.  General: Alert, no acute distress Oral: Normal oral mucosa.  On examination of the tongue she has had previous partial glossectomy and just anterior to her previous excision site she has a small 8 mm region that have been biopsied that demonstrated squamous cell carcinoma that extended to the margins. Nasal: Clear nasal passages Neck: No palpable adenopathy or thyroid nodules.  No palpable adenopathy in the neck on either side Ear: Ear canal is clear with normal appearing TMs Cardiovascular: Regular rate and rhythm, no murmur.  Respiratory: Clear to auscultation Neurologic: Alert and oriented x 3   Assessment/Plan: T1 squamous cell carcinoma of the right lateral tongue.  Plan for wide excision of previous biopsy site.  We will plan this under local anesthesia.   [name], MD 06/15/2020 1:27 PM

## 2020-06-17 ENCOUNTER — Other Ambulatory Visit (HOSPITAL_COMMUNITY)
Admission: RE | Admit: 2020-06-17 | Discharge: 2020-06-17 | Disposition: A | Discharge status: Home / Self Care | Payer: Medicare Other | Source: Ambulatory Visit | Attending: Otolaryngology | Admitting: Otolaryngology

## 2020-06-17 DIAGNOSIS — Z01812 Encounter for preprocedural laboratory examination: Secondary | ICD-10-CM | POA: Diagnosis not present

## 2020-06-17 DIAGNOSIS — Z20822 Contact with and (suspected) exposure to covid-19: Secondary | ICD-10-CM | POA: Diagnosis not present

## 2020-06-17 LAB — SARS CORONAVIRUS 2 (TAT 6-24 HRS): SARS Coronavirus 2: NEGATIVE

## 2020-06-21 ENCOUNTER — Ambulatory Visit (HOSPITAL_BASED_OUTPATIENT_CLINIC_OR_DEPARTMENT_OTHER): Payer: Medicare Other | Admitting: Anesthesiology

## 2020-06-21 ENCOUNTER — Ambulatory Visit (HOSPITAL_BASED_OUTPATIENT_CLINIC_OR_DEPARTMENT_OTHER)
Admission: RE | Admit: 2020-06-21 | Discharge: 2020-06-21 | Disposition: A | Discharge status: Home / Self Care | Payer: Medicare Other | Source: Ambulatory Visit | Attending: Otolaryngology | Admitting: Otolaryngology

## 2020-06-21 ENCOUNTER — Encounter (HOSPITAL_BASED_OUTPATIENT_CLINIC_OR_DEPARTMENT_OTHER)
Admission: RE | Disposition: A | Discharge status: Home / Self Care | Payer: Self-pay | Source: Ambulatory Visit | Attending: Otolaryngology

## 2020-06-21 DIAGNOSIS — C021 Malignant neoplasm of border of tongue: Secondary | ICD-10-CM | POA: Diagnosis not present

## 2020-06-21 DIAGNOSIS — Z79899 Other long term (current) drug therapy: Secondary | ICD-10-CM | POA: Insufficient documentation

## 2020-06-21 DIAGNOSIS — C029 Malignant neoplasm of tongue, unspecified: Secondary | ICD-10-CM | POA: Diagnosis not present

## 2020-06-21 DIAGNOSIS — Z885 Allergy status to narcotic agent status: Secondary | ICD-10-CM | POA: Insufficient documentation

## 2020-06-21 DIAGNOSIS — K219 Gastro-esophageal reflux disease without esophagitis: Secondary | ICD-10-CM | POA: Insufficient documentation

## 2020-06-21 DIAGNOSIS — Z833 Family history of diabetes mellitus: Secondary | ICD-10-CM | POA: Diagnosis not present

## 2020-06-21 DIAGNOSIS — D0007 Carcinoma in situ of tongue: Secondary | ICD-10-CM | POA: Diagnosis not present

## 2020-06-21 DIAGNOSIS — Z809 Family history of malignant neoplasm, unspecified: Secondary | ICD-10-CM | POA: Diagnosis not present

## 2020-06-21 DIAGNOSIS — Z8249 Family history of ischemic heart disease and other diseases of the circulatory system: Secondary | ICD-10-CM | POA: Insufficient documentation

## 2020-06-21 DIAGNOSIS — Z9049 Acquired absence of other specified parts of digestive tract: Secondary | ICD-10-CM | POA: Diagnosis not present

## 2020-06-21 DIAGNOSIS — Z800 Family history of malignant neoplasm of digestive organs: Secondary | ICD-10-CM | POA: Insufficient documentation

## 2020-06-21 DIAGNOSIS — I10 Essential (primary) hypertension: Secondary | ICD-10-CM | POA: Diagnosis not present

## 2020-06-21 DIAGNOSIS — K140 Glossitis: Secondary | ICD-10-CM | POA: Diagnosis not present

## 2020-06-21 HISTORY — PX: MINOR EXCISION OF ORAL LESION: SHX6466

## 2020-06-21 SURGERY — MINOR EXCISION OF ORAL LESION
Anesthesia: LOCAL | Site: Mouth

## 2020-06-21 MED ORDER — LIDOCAINE-EPINEPHRINE 1 %-1:100000 IJ SOLN
INTRAMUSCULAR | Status: AC
  Filled 2020-06-21: qty 2

## 2020-06-21 MED ORDER — AMOXICILLIN 875 MG PO TABS: 875.0000 mg | ORAL_TABLET | Freq: Two times a day (BID) | ORAL | 0 refills | Status: DC

## 2020-06-21 MED ORDER — CEFAZOLIN (ANCEF) 1 G IV SOLR: INTRAVENOUS | Status: DC | PRN

## 2020-06-21 MED ORDER — SILVER NITRATE-POT NITRATE 75-25 % EX MISC
CUTANEOUS | Status: AC
  Filled 2020-06-21: qty 10

## 2020-06-21 MED ORDER — CEFAZOLIN SODIUM-DEXTROSE 2-4 GM/100ML-% IV SOLN
2.0000 g | INTRAVENOUS | Status: AC
  Administered 2020-06-21: 2 g via INTRAVENOUS

## 2020-06-21 MED ORDER — BACITRACIN ZINC 500 UNIT/GM EX OINT
TOPICAL_OINTMENT | CUTANEOUS | Status: AC
  Filled 2020-06-21: qty 0.9

## 2020-06-21 MED ORDER — CHLORHEXIDINE GLUCONATE CLOTH 2 % EX PADS: 6.0000 | MEDICATED_PAD | Freq: Once | CUTANEOUS | Status: DC

## 2020-06-21 MED ORDER — CEFAZOLIN SODIUM-DEXTROSE 2-4 GM/100ML-% IV SOLN
INTRAVENOUS | Status: AC
  Filled 2020-06-21: qty 100

## 2020-06-21 MED ORDER — LIDOCAINE-EPINEPHRINE 1 %-1:100000 IJ SOLN
INTRAMUSCULAR | Status: DC | PRN
  Administered 2020-06-21: 7 mL

## 2020-06-21 SURGICAL SUPPLY — 40 items
BLADE SURG 15 STRL LF DISP TIS (BLADE) ×1 IMPLANT
BLADE SURG 15 STRL SS (BLADE) ×2
CANISTER SUCT 1200ML W/VALVE (MISCELLANEOUS) ×2 IMPLANT
CAUTERY EYE LOW TEMP 1300F FIN (OPHTHALMIC RELATED) IMPLANT
CLEANER CAUTERY TIP 5X5 PAD (MISCELLANEOUS) IMPLANT
COVER WAND RF STERILE (DRAPES) IMPLANT
DECANTER SPIKE VIAL GLASS SM (MISCELLANEOUS) ×2 IMPLANT
ELECT COATED BLADE 2.86 ST (ELECTRODE) ×2 IMPLANT
ELECT REM PT RETURN 9FT ADLT (ELECTROSURGICAL) ×2
ELECTRODE REM PT RTRN 9FT ADLT (ELECTROSURGICAL) ×1 IMPLANT
GAUZE 4X4 16PLY RFD (DISPOSABLE) IMPLANT
GLOVE SURG MICRO LTX SZ7.5 (GLOVE) ×2 IMPLANT
GLOVE SURG POLYISO LF SZ8 (GLOVE) ×2 IMPLANT
GOWN STRL REUS W/ TWL LRG LVL3 (GOWN DISPOSABLE) ×1 IMPLANT
GOWN STRL REUS W/TWL 2XL LVL3 (GOWN DISPOSABLE) ×2 IMPLANT
GOWN STRL REUS W/TWL LRG LVL3 (GOWN DISPOSABLE) ×2
NEEDLE PRECISIONGLIDE 27X1.5 (NEEDLE) ×2 IMPLANT
NS IRRIG 1000ML POUR BTL (IV SOLUTION) ×2 IMPLANT
PACK BASIN DAY SURGERY FS (CUSTOM PROCEDURE TRAY) ×2 IMPLANT
PAD CLEANER CAUTERY TIP 5X5 (MISCELLANEOUS)
PENCIL SMOKE EVACUATOR (MISCELLANEOUS) ×2 IMPLANT
SPONGE INTESTINAL PEANUT (DISPOSABLE) IMPLANT
SUCTION FRAZIER HANDLE 10FR (MISCELLANEOUS)
SUCTION TUBE FRAZIER 10FR DISP (MISCELLANEOUS) IMPLANT
SUT CHROMIC 3 0 PS 2 (SUTURE) IMPLANT
SUT CHROMIC 4 0 P 3 18 (SUTURE) IMPLANT
SUT ETHILON 5 0 P 3 18 (SUTURE)
SUT ETHILON 6 0 P 1 (SUTURE) IMPLANT
SUT NYLON ETHILON 5-0 P-3 1X18 (SUTURE) IMPLANT
SUT PLAIN 5 0 P 3 18 (SUTURE) IMPLANT
SUT SILK 4 0 P 3 (SUTURE) ×2 IMPLANT
SUT SILK 4 0 TIES 17X18 (SUTURE) IMPLANT
SUT VIC AB 4-0 P-3 18XBRD (SUTURE) ×1 IMPLANT
SUT VIC AB 4-0 P3 18 (SUTURE) ×2
SUT VIC AB 4-0 SH 18 (SUTURE) ×2 IMPLANT
SYR BULB EAR ULCER 3OZ GRN STR (SYRINGE) IMPLANT
SYR CONTROL 10ML LL (SYRINGE) ×2 IMPLANT
TOWEL GREEN STERILE FF (TOWEL DISPOSABLE) ×2 IMPLANT
TUBE CONNECTING 20X1/4 (TUBING) ×2 IMPLANT
YANKAUER SUCT BULB TIP NO VENT (SUCTIONS) IMPLANT

## 2020-06-21 NOTE — Brief Op Note (Signed)
06/21/2020  8:11 AM  PATIENT:  Stacy Wagner  80 y.o. female  PRE-OPERATIVE DIAGNOSIS:  SQUAMOUS CELL CARCINOMA OF TONGUE  POST-OPERATIVE DIAGNOSIS:  SQUAMOUS CELL CARCINOMA OF TONGUE  PROCEDURE:  Procedure(s): EXCISION OF TONGUE LESION WITH CLOSURE (N/A)  SURGEON:  Surgeon(s) and Role:    * Rozetta Nunnery, MD - Primary  PHYSICIAN ASSISTANT:   ASSISTANTS: none   ANESTHESIA:   local  EBL:  minimal   BLOOD ADMINISTERED:none  DRAINS: none   LOCAL MEDICATIONS USED:  XYLOCAINE with epi 7 cc  SPECIMEN:  Source of Specimen:  right lateral tongue  DISPOSITION OF SPECIMEN:  PATHOLOGY  COUNTS:  YES  TOURNIQUET:  * No tourniquets in log *  DICTATION: .Other Dictation: Dictation Number [id number]  PLAN OF CARE: Discharge to home after PACU  PATIENT DISPOSITION:  PACU - hemodynamically stable.   Delay start of Pharmacological VTE agent (>24hrs) due to surgical blood loss or risk of bleeding: yes

## 2020-06-21 NOTE — Discharge Instructions (Signed)
Take your regular meds. Take tylenol , ibuprofen and throat lozenges or sprays for sore throat as needed Also  Amoxicillin 875 mg bid for the next week Return to see Dr Lucia Gaskins next Wednesday at 11:45 Call office if you have any problems or questions.  [phone]

## 2020-06-21 NOTE — Op Note (Signed)
NAME: Stacy Wagner, Stacy Wagner MEDICAL RECORD NO: [id number] ACCOUNT NO: [id number] DATE OF BIRTH: 25-Dec-1940 FACILITY: [facility] LOCATION: [facility] PHYSICIAN: Leonides Sake. Lucia Gaskins, MD  Operative Report   DATE OF PROCEDURE: 06/21/2020  PREOPERATIVE DIAGNOSIS:  Squamous cell carcinoma of the right lateral tongue.  POSTOPERATIVE DIAGNOSIS:  Squamous cell carcinoma of the right lateral tongue.  OPERATION PERFORMED:  Partial right lateral glossectomy with closure.  SURGEON:  [name], MD  ANESTHESIA:  1% Xylocaine with epinephrine, 7 mL.  ESTIMATED BLOOD LOSS:  Minimal.  COMPLICATIONS:  None.  BRIEF CLINICAL NOTE:  The patient is a 80 year old female who initially had a partial glossectomy performed 2-1/2 years ago for squamous cell carcinoma of the right lateral tongue.  She has been followed regularly and on recent followup a couple of weeks  ago, she had a persistent sore just anterior to the previous excision site and biopsy performed in the office revealed squamous cell carcinoma with a 2 mm depth of invasion.  She is taken to the operating room at this time for wide excision of the  biopsy site.  The initial lesion was less than a centimeter in size.  DESCRIPTION OF PROCEDURE:  The patient was brought to the operating room.  The area was evaluated where the previous biopsy site was performed.  This was on the right lateral undersurface of the tongue adjacent to dentition on the right lower premolars.   The right lateral tongue was injected with 6-7 mL of Xylocaine with epinephrine for local anesthesia.  The area was then sharply excised with a scalpel with approximate 5-6 mm margins around the previous excision site.  Depth was also carried out into the  muscle and the deep portion was excised with the cautery going down about 5 to 6 mm.  Specimen was excised with minimal bleeding.  The specimen was then marked with a long suture at the anterior margin or 12 o'clock margin, short  suture at the posterior  margin or the 6 o'clock margin and a long short suture at the lateral margin or dorsal margin at the 9 o'clock position.  The defect was closed with interrupted 4-0 Vicryl sutures x5.  The patient tolerated this well.  DISPOSITION:  The patient is discharged home later this morning.  Instructed to take Tylenol and ibuprofen p.r.n. pain.  Also called in amoxicillin 875 mg b.i.d. for a week.  She will follow up in my office in 8-9 days for recheck and review pathology.   NIK D: 06/21/2020 8:18:03 am T: 06/21/2020 10:18:00 am  JOB: [id number]/ [id number]

## 2020-06-21 NOTE — Interval H&P Note (Signed)
History and Physical Interval Note:  06/21/2020 7:27 AM  Stacy Wagner  has presented today for surgery, with the diagnosis of SQUAMOUS CELL CARCINOMA OF TONGUE.  The various methods of treatment have been discussed with the patient and family. After consideration of risks, benefits and other options for treatment, the patient has consented to  Procedure(s): EXCISION OF TONGUE LESION WITH CLOSURE (N/A) as a surgical intervention.  The patient's history has been reviewed, patient examined, no change in status, stable for surgery.  I have reviewed the patient's chart and labs.  Questions were answered to the patient's satisfaction.     [name]

## 2020-06-22 ENCOUNTER — Encounter (HOSPITAL_BASED_OUTPATIENT_CLINIC_OR_DEPARTMENT_OTHER): Payer: Self-pay | Admitting: Otolaryngology

## 2020-06-22 LAB — SURGICAL PATHOLOGY

## 2020-06-27 DIAGNOSIS — M545 Low back pain, unspecified: Secondary | ICD-10-CM | POA: Diagnosis not present

## 2020-06-29 ENCOUNTER — Other Ambulatory Visit: Payer: Self-pay

## 2020-06-29 ENCOUNTER — Ambulatory Visit (INDEPENDENT_AMBULATORY_CARE_PROVIDER_SITE_OTHER): Payer: Medicare Other | Admitting: Otolaryngology

## 2020-06-29 VITALS — Temp 97.0°F

## 2020-06-29 DIAGNOSIS — Z4889 Encounter for other specified surgical aftercare: Secondary | ICD-10-CM

## 2020-06-29 NOTE — Progress Notes (Signed)
HPI: Stacy Wagner is a 80 y.o. female who presents 8 days s/p partial right glossectomy for squamous cell carcinoma.  Final pathology report revealed invasive squamous cell carcinoma as well as carcinoma in situ.  Depth of invasion was 45mm.  Margins were all negative.  She is doing well.  Past Medical History:  Diagnosis Date  . AC (acromioclavicular) joint bone spurs   . Arthritis    "lower back" (01/15/2018)  . Back pain   . Bursitis disorder    bil shoulders  . Cataracts, bilateral   . Chronic lower back pain   . Complication of anesthesia    some nausea in past with anesthesia, pt. also recalls having the "tube removed too early & her her throat was full of mucous, states that she could move any part of her body)  . Family history of adverse reaction to anesthesia    "daughter gets PONV" (01/15/2018)  . GERD (gastroesophageal reflux disease)   . Hearing loss    bil / no hearing aids  . Hypertension   . Hypothyroidism   . Idiopathic scoliosis   . Leg pain   . Lumbar radiculopathy   . Lumbar stenosis with neurogenic claudication   . Migraine    "used to have them all the time; none in 10 yrs" (01/15/2018)  . Mouth sores   . Neck pain   . OA (osteoarthritis)    Multiple injections in lumbar region   . PONV (postoperative nausea and vomiting)   . Prolapse of female bladder, acquired   . Sagittal plane imbalance   . Skin cancer 11/2017   "left thigh"  . Thyroid disease   . Tongue cancer ([place])    12-2017   Past Surgical History:  Procedure Laterality Date  . ABDOMINAL EXPOSURE N/A 08/04/2019   Procedure: ABDOMINAL EXPOSURE;  Surgeon: Angelia Mould, MD;  Location: [facility] OR;  Service: Vascular;  Laterality: N/A;  . ABDOMINAL HYSTERECTOMY     uterine prolapse  . ABDOMINOPLASTY     tummy tuck Emilio Aspen excess skin arms and legs  . ANTERIOR LATERAL LUMBAR FUSION 4 LEVELS Right 08/04/2019   Procedure: Lumbar One-Two, Lumbar Two-Three, Lumbar Three-Four, Lumbar Four-Five   Anterolateral Lumbar Interbody Fusion;  Surgeon: Erline Levine, MD;  Location: [place];  Service: Neurosurgery;  Laterality: Right;  . ANTERIOR LUMBAR FUSION N/A 08/04/2019   Procedure: Lumbar Five - Sacral One  Anterior Lumbar Interbody Fusion;  Surgeon: Erline Levine, MD;  Location: [place];  Service: Neurosurgery;  Laterality: N/A;  Dr Deitra Mayo to assist  . APPENDECTOMY    . APPLICATION OF INTRAOPERATIVE CT SCAN N/A 08/07/2019   Procedure: APPLICATION OF INTRAOPERATIVE CT SCAN;  Surgeon: Erline Levine, MD;  Location: [place];  Service: Neurosurgery;  Laterality: N/A;  . BACK SURGERY    . BREAST SURGERY Right 1980   removal of a nodule- post trauma, benign pathology  . CARPAL TUNNEL RELEASE Right   . CATARACT EXTRACTION, BILATERAL    . COLONOSCOPY    . GLOSSECTOMY N/A 01/15/2018   Procedure: PARTIAL GLOSSECTOMY;  Surgeon: Rozetta Nunnery, MD;  Location: [facility] OR;  Service: ENT;  Laterality: N/A;  . LUMBAR SPINE SURGERY  1988   /w Dr. Joya Salm- reports that it was in the lumbar region, unsure of date.  Marland Kitchen MINOR EXCISION OF ORAL LESION N/A 06/21/2020   Procedure: EXCISION OF TONGUE LESION WITH CLOSURE;  Surgeon: Rozetta Nunnery, MD;  Location: [place];  Service: ENT;  Laterality: N/A;  . NASAL POLYP EXCISION    . PARTIAL GLOSSECTOMY  01/15/2018  . POSTERIOR LUMBAR FUSION 4 LEVEL N/A 08/07/2019   Procedure: Thoracic Ten to pelvis fixation;  Surgeon: Erline Levine, MD;  Location: [place];  Service: Neurosurgery;  Laterality: N/A;  Thoracic Ten to pelvis fixation  . SKIN CANCER EXCISION Left 11/2017   "thigh"  . TONGUE SURGERY    . TONSILLECTOMY    . TUMMY TUCK     Social History   Socioeconomic History  . Marital status: Married    Spouse name: Not on file  . Number of children: Not on file  . Years of education: Not on file  . Highest education level: Not on file  Occupational History  . Occupation: RETIRED  Tobacco Use  . Smoking status: Never Smoker  .  Smokeless tobacco: Never Used  Vaping Use  . Vaping Use: Never used  Substance and Sexual Activity  . Alcohol use: Not Currently    Alcohol/week: 0.0 standard drinks  . Drug use: Never  . Sexual activity: Not Currently    Partners: Male    Birth control/protection: Surgical  Other Topics Concern  . Not on file  Social History Narrative  . Not on file   Social Determinants of Health   Financial Resource Strain: Not on file  Food Insecurity: Not on file  Transportation Needs: Not on file  Physical Activity: Not on file  Stress: Not on file  Social Connections: Not on file   Family History  Problem Relation Age of Onset  . Cancer Mother        ?lung cancer  . Hypertension Sister   . Diabetes Father   . Stomach cancer Cousin   . Rectal cancer Neg Hx   . Esophageal cancer Neg Hx   . Colon cancer Neg Hx    Allergies  Allergen Reactions  . Codeine Nausea Only   Prior to Admission medications   Medication Sig Start Date End Date Taking? Authorizing Provider  acetaminophen (TYLENOL) 325 MG tablet Take 1-2 tablets (325-650 mg total) by mouth every 4 (four) hours as needed for mild pain. 08/25/19   Love, Ivan Anchors, PA-C  amoxicillin (AMOXIL) 875 MG tablet Take 1 tablet (875 mg total) by mouth 2 (two) times daily. 06/21/20   Rozetta Nunnery, MD  Ascorbic Acid (VITAMIN C PO) Take 1 tablet by mouth daily.     [provider]  b complex vitamins tablet Take 1 tablet by mouth daily.    [provider]  calcium citrate (CALCITRATE - DOSED IN MG ELEMENTAL CALCIUM) 950 (200 Ca) MG tablet Take 1 tablet (200 mg of elemental calcium total) by mouth daily. 08/26/19   Love, Ivan Anchors, PA-C  diphenhydrAMINE (BENADRYL) 25 MG tablet Take 25 mg by mouth at bedtime as needed for sleep.    [provider]  estradiol (ESTRACE) 0.5 MG tablet Take 1 tablet (0.5 mg total) by mouth daily. 01/01/20   Burchette, Alinda Sierras, MD  levothyroxine (SYNTHROID) 75 MCG tablet Take one  tablet by mouth each morning. 01/01/20   Burchette, Alinda Sierras, MD  Multiple Vitamins-Minerals (HAIR/SKIN/NAILS/BIOTIN PO) Take 1 tablet by mouth daily.    [provider]  Multiple Vitamins-Minerals (MULTIVITAMIN WITH MINERALS) tablet Take 1 tablet by mouth daily.    [provider]  Multiple Vitamins-Minerals (OSTEO COMPLEX PO) Take 1 tablet by mouth daily.     [provider]  triamcinolone cream (KENALOG) 0.1 % Apply 1  application topically 2 (two) times daily as needed (irritation).  07/06/19   [provider]  triamterene-hydrochlorothiazide (MAXZIDE) 75-50 MG tablet Take 1 tablet by mouth daily with breakfast. 01/01/20 01/30/21  Burchette, Alinda Sierras, MD  VITAMIN D PO Take 1 tablet by mouth daily.     [provider]     Physical Exam: Excision site is healing nicely.  Vicryl sutures are still in place.  She has slight separation of the wound posteriorly but no signs of significant infection. No palpable adenopathy in the neck.   Assessment: S/p excision of T1N0 squamous cell carcinoma of the right lateral tongue.  Negative margins.  Plan: She will follow-up in 1 month for recheck   Radene Journey, MD

## 2020-07-04 DIAGNOSIS — M545 Low back pain, unspecified: Secondary | ICD-10-CM | POA: Diagnosis not present

## 2020-07-07 DIAGNOSIS — M545 Low back pain, unspecified: Secondary | ICD-10-CM | POA: Diagnosis not present

## 2020-07-12 DIAGNOSIS — M545 Low back pain, unspecified: Secondary | ICD-10-CM | POA: Diagnosis not present

## 2020-07-14 DIAGNOSIS — M545 Low back pain, unspecified: Secondary | ICD-10-CM | POA: Diagnosis not present

## 2020-07-19 DIAGNOSIS — M545 Low back pain, unspecified: Secondary | ICD-10-CM | POA: Diagnosis not present

## 2020-07-21 DIAGNOSIS — M545 Low back pain, unspecified: Secondary | ICD-10-CM | POA: Diagnosis not present

## 2020-07-28 ENCOUNTER — Encounter (INDEPENDENT_AMBULATORY_CARE_PROVIDER_SITE_OTHER): Payer: Medicare Other | Admitting: Otolaryngology

## 2020-08-04 DIAGNOSIS — M545 Low back pain, unspecified: Secondary | ICD-10-CM | POA: Diagnosis not present

## 2020-08-24 ENCOUNTER — Other Ambulatory Visit: Payer: Self-pay

## 2020-08-24 ENCOUNTER — Ambulatory Visit (INDEPENDENT_AMBULATORY_CARE_PROVIDER_SITE_OTHER): Payer: Medicare Other | Admitting: Otolaryngology

## 2020-08-24 DIAGNOSIS — Z85810 Personal history of malignant neoplasm of tongue: Secondary | ICD-10-CM

## 2020-08-24 NOTE — Progress Notes (Signed)
HPI: Stacy Wagner is a 80 y.o. female who returns today for evaluation of right lateral T1N0 squamous cell carcinoma status post excision 2 months ago.  She is doing well except for complaints of occasional burning sensation in this area..  She is also in the process of getting a dental or mouth guard to prevent the tongue from hitting the teeth.  Past Medical History:  Diagnosis Date   AC (acromioclavicular) joint bone spurs    Arthritis    "lower back" (01/15/2018)   Back pain    Bursitis disorder    bil shoulders   Cataracts, bilateral    Chronic lower back pain    Complication of anesthesia    some nausea in past with anesthesia, pt. also recalls having the "tube removed too early & her her throat was full of mucous, states that she could move any part of her body)   Family history of adverse reaction to anesthesia    "daughter gets PONV" (01/15/2018)   GERD (gastroesophageal reflux disease)    Hearing loss    bil / no hearing aids   Hypertension    Hypothyroidism    Idiopathic scoliosis    Leg pain    Lumbar radiculopathy    Lumbar stenosis with neurogenic claudication    Migraine    "used to have them all the time; none in 10 yrs" (01/15/2018)   Mouth sores    Neck pain    OA (osteoarthritis)    Multiple injections in lumbar region    PONV (postoperative nausea and vomiting)    Prolapse of female bladder, acquired    Sagittal plane imbalance    Skin cancer 11/2017   "left thigh"   Thyroid disease    Tongue cancer ([place])    12-2017   Past Surgical History:  Procedure Laterality Date   ABDOMINAL EXPOSURE N/A 08/04/2019   Procedure: ABDOMINAL EXPOSURE;  Surgeon: Angelia Mould, MD;  Location: [facility] OR;  Service: Vascular;  Laterality: N/A;   ABDOMINAL HYSTERECTOMY     uterine prolapse   ABDOMINOPLASTY     tummy tuck Emilio Aspen excess skin arms and legs   ANTERIOR LATERAL LUMBAR FUSION 4 LEVELS Right 08/04/2019   Procedure: Lumbar One-Two, Lumbar Two-Three,  Lumbar Three-Four, Lumbar Four-Five  Anterolateral Lumbar Interbody Fusion;  Surgeon: Erline Levine, MD;  Location: [place];  Service: Neurosurgery;  Laterality: Right;   ANTERIOR LUMBAR FUSION N/A 08/04/2019   Procedure: Lumbar Five - Sacral One  Anterior Lumbar Interbody Fusion;  Surgeon: Erline Levine, MD;  Location: [place];  Service: Neurosurgery;  Laterality: N/A;  Dr Deitra Mayo to assist   APPENDECTOMY     APPLICATION OF INTRAOPERATIVE CT SCAN N/A 08/07/2019   Procedure: APPLICATION OF INTRAOPERATIVE CT SCAN;  Surgeon: Erline Levine, MD;  Location: [place];  Service: Neurosurgery;  Laterality: N/A;   BACK SURGERY     BREAST SURGERY Right 1980   removal of a nodule- post trauma, benign pathology   CARPAL TUNNEL RELEASE Right    CATARACT EXTRACTION, BILATERAL     COLONOSCOPY     GLOSSECTOMY N/A 01/15/2018   Procedure: PARTIAL GLOSSECTOMY;  Surgeon: Rozetta Nunnery, MD;  Location: [facility] OR;  Service: ENT;  Laterality: N/A;   LUMBAR SPINE SURGERY  1988   /w Dr. Joya Salm- reports that it was in the lumbar region, unsure of date.   MINOR EXCISION OF ORAL LESION N/A 06/21/2020   Procedure: EXCISION OF TONGUE LESION WITH CLOSURE;  Surgeon: [name]  E, MD;  Location: [place];  Service: ENT;  Laterality: N/A;   NASAL POLYP EXCISION     PARTIAL GLOSSECTOMY  01/15/2018   POSTERIOR LUMBAR FUSION 4 LEVEL N/A 08/07/2019   Procedure: Thoracic Ten to pelvis fixation;  Surgeon: Erline Levine, MD;  Location: [place];  Service: Neurosurgery;  Laterality: N/A;  Thoracic Ten to pelvis fixation   SKIN CANCER EXCISION Left 11/2017   "thigh"   TONGUE SURGERY     TONSILLECTOMY     TUMMY TUCK     Social History   Socioeconomic History   Marital status: Married    Spouse name: Not on file   Number of children: Not on file   Years of education: Not on file   Highest education level: Not on file  Occupational History   Occupation: RETIRED  Tobacco Use   Smoking status: Never    Smokeless tobacco: Never  Vaping Use   Vaping Use: Never used  Substance and Sexual Activity   Alcohol use: Not Currently    Alcohol/week: 0.0 standard drinks   Drug use: Never   Sexual activity: Not Currently    Partners: Male    Birth control/protection: Surgical  Other Topics Concern   Not on file  Social History Narrative   Not on file   Social Determinants of Health   Financial Resource Strain: Not on file  Food Insecurity: Not on file  Transportation Needs: Not on file  Physical Activity: Not on file  Stress: Not on file  Social Connections: Not on file   Family History  Problem Relation Age of Onset   Cancer Mother        ?lung cancer   Hypertension Sister    Diabetes Father    Stomach cancer Cousin    Rectal cancer Neg Hx    Esophageal cancer Neg Hx    Colon cancer Neg Hx    Allergies  Allergen Reactions   Codeine Nausea Only   Prior to Admission medications   Medication Sig Start Date End Date Taking? Authorizing Provider  acetaminophen (TYLENOL) 325 MG tablet Take 1-2 tablets (325-650 mg total) by mouth every 4 (four) hours as needed for mild pain. 08/25/19   Love, Ivan Anchors, PA-C  amoxicillin (AMOXIL) 875 MG tablet Take 1 tablet (875 mg total) by mouth 2 (two) times daily. 06/21/20   Rozetta Nunnery, MD  Ascorbic Acid (VITAMIN C PO) Take 1 tablet by mouth daily.     [provider]  b complex vitamins tablet Take 1 tablet by mouth daily.    [provider]  calcium citrate (CALCITRATE - DOSED IN MG ELEMENTAL CALCIUM) 950 (200 Ca) MG tablet Take 1 tablet (200 mg of elemental calcium total) by mouth daily. 08/26/19   Love, Ivan Anchors, PA-C  diphenhydrAMINE (BENADRYL) 25 MG tablet Take 25 mg by mouth at bedtime as needed for sleep.    [provider]  estradiol (ESTRACE) 0.5 MG tablet Take 1 tablet (0.5 mg total) by mouth daily. 01/01/20   Burchette, Alinda Sierras, MD  levothyroxine (SYNTHROID) 75 MCG tablet Take one tablet by mouth each  morning. 01/01/20   Burchette, Alinda Sierras, MD  Multiple Vitamins-Minerals (HAIR/SKIN/NAILS/BIOTIN PO) Take 1 tablet by mouth daily.    [provider]  Multiple Vitamins-Minerals (MULTIVITAMIN WITH MINERALS) tablet Take 1 tablet by mouth daily.    [provider]  Multiple Vitamins-Minerals (OSTEO COMPLEX PO) Take 1 tablet by mouth daily.     [provider]  triamcinolone cream (KENALOG) 0.1 % Apply 1 application topically 2 (two) times daily as needed (irritation).  07/06/19   [provider]  triamterene-hydrochlorothiazide (MAXZIDE) 75-50 MG tablet Take 1 tablet by mouth daily with breakfast. 01/01/20 01/30/21  Burchette, Alinda Sierras, MD  VITAMIN D PO Take 1 tablet by mouth daily.     [provider]     Positive ROS: Otherwise negative  All other systems have been reviewed and were otherwise negative with the exception of those mentioned in the HPI and as above.  Physical Exam: Constitutional: Alert, well-appearing, no acute distress Ears: External ears without lesions or tenderness. Ear canals are clear bilaterally with intact, clear TMs.  Nasal: External nose without lesions. Clear nasal passages Oral: Lips and gums without lesions. Tongue and palate mucosa without lesions. Posterior oropharynx clear.  On examination of the tongue she has a small defect where the resection was previously performed but this is healing nicely with no residual leukoplakia or ulceration noted.  The area is soft to palpation. Neck: No palpable adenopathy or masses.  Neck and submandibular region reveals no palpable adenopathy on the right side. Respiratory: Breathing comfortably  Skin: No facial/neck lesions or rash noted.  Procedures  Assessment 2 months status post excision of a T1 N0 squamous cell carcinoma of the right lateral tongue.  She is doing well.  Plan: She will follow-up in 3 months for recheck.   Radene Journey, MD

## 2020-08-25 DIAGNOSIS — M419 Scoliosis, unspecified: Secondary | ICD-10-CM | POA: Diagnosis not present

## 2020-08-30 ENCOUNTER — Ambulatory Visit (INDEPENDENT_AMBULATORY_CARE_PROVIDER_SITE_OTHER): Payer: Medicare Other | Admitting: Family Medicine

## 2020-08-30 ENCOUNTER — Encounter: Payer: Self-pay | Admitting: Family Medicine

## 2020-08-30 ENCOUNTER — Other Ambulatory Visit: Payer: Self-pay

## 2020-08-30 VITALS — BP 116/68 | HR 82 | Temp 97.6°F | Ht 68.0 in | Wt 160.4 lb

## 2020-08-30 DIAGNOSIS — L282 Other prurigo: Secondary | ICD-10-CM

## 2020-08-30 MED ORDER — TRIAMCINOLONE ACETONIDE 0.1 % EX CREA: 1.0000 "application " | TOPICAL_CREAM | Freq: Two times a day (BID) | CUTANEOUS | 1 refills | Status: AC | PRN

## 2020-08-30 NOTE — Patient Instructions (Signed)
Try some over the counter Zyrtec or  Allegra once daily for the itching  May also use Benadryl at night.  Avoid heat which can worsen the rash.

## 2020-08-30 NOTE — Progress Notes (Signed)
Established Patient Office Visit  Subjective:  Patient ID: Stacy Wagner, female    DOB: [birth year]  Age: 80 y.o. MRN: [id number]  CC:  Chief Complaint  Patient presents with   skin breaking out on arms and ankles swollen    HPI Stacy Wagner presents for pruritic rash involving her arms and legs and forearms for the past several days.  She states that this seemed to be worse after mowing last week.  She does not use any sunblock.  She took some Benadryl at night which may have helped last week.  She states she was unable to find Benadryl store recently.  Has not tried any nonsedating antihistamines or any topicals.  She states that she has noted some slightly raised urticarial type lesions at times.  Rash seem to be worse with heat.  No recent fever.  No recent change of medication.  No new soaps or detergents.  Past Medical History:  Diagnosis Date   AC (acromioclavicular) joint bone spurs    Arthritis    "lower back" (01/15/2018)   Back pain    Bursitis disorder    bil shoulders   Cataracts, bilateral    Chronic lower back pain    Complication of anesthesia    some nausea in past with anesthesia, pt. also recalls having the "tube removed too early & her her throat was full of mucous, states that she could move any part of her body)   Family history of adverse reaction to anesthesia    "daughter gets PONV" (01/15/2018)   GERD (gastroesophageal reflux disease)    Hearing loss    bil / no hearing aids   Hypertension    Hypothyroidism    Idiopathic scoliosis    Leg pain    Lumbar radiculopathy    Lumbar stenosis with neurogenic claudication    Migraine    "used to have them all the time; none in 10 yrs" (01/15/2018)   Mouth sores    Neck pain    OA (osteoarthritis)    Multiple injections in lumbar region    PONV (postoperative nausea and vomiting)    Prolapse of female bladder, acquired    Sagittal plane imbalance    Skin cancer 11/2017   "left thigh"    Thyroid disease    Tongue cancer ([place])    12-2017    Past Surgical History:  Procedure Laterality Date   ABDOMINAL EXPOSURE N/A 08/04/2019   Procedure: ABDOMINAL EXPOSURE;  Surgeon: Angelia Mould, MD;  Location: [facility] OR;  Service: Vascular;  Laterality: N/A;   ABDOMINAL HYSTERECTOMY     uterine prolapse   ABDOMINOPLASTY     tummy tuck Emilio Aspen excess skin arms and legs   ANTERIOR LATERAL LUMBAR FUSION 4 LEVELS Right 08/04/2019   Procedure: Lumbar One-Two, Lumbar Two-Three, Lumbar Three-Four, Lumbar Four-Five  Anterolateral Lumbar Interbody Fusion;  Surgeon: Erline Levine, MD;  Location: [place];  Service: Neurosurgery;  Laterality: Right;   ANTERIOR LUMBAR FUSION N/A 08/04/2019   Procedure: Lumbar Five - Sacral One  Anterior Lumbar Interbody Fusion;  Surgeon: Erline Levine, MD;  Location: [place];  Service: Neurosurgery;  Laterality: N/A;  Dr Deitra Mayo to assist   APPENDECTOMY     APPLICATION OF INTRAOPERATIVE CT SCAN N/A 08/07/2019   Procedure: APPLICATION OF INTRAOPERATIVE CT SCAN;  Surgeon: Erline Levine, MD;  Location: [place];  Service: Neurosurgery;  Laterality: N/A;   BACK SURGERY     BREAST SURGERY Right 1980   removal  of a nodule- post trauma, benign pathology   CARPAL TUNNEL RELEASE Right    CATARACT EXTRACTION, BILATERAL     COLONOSCOPY     GLOSSECTOMY N/A 01/15/2018   Procedure: PARTIAL GLOSSECTOMY;  Surgeon: Rozetta Nunnery, MD;  Location: [facility] OR;  Service: ENT;  Laterality: N/A;   LUMBAR SPINE SURGERY  1988   /w Dr. Joya Salm- reports that it was in the lumbar region, unsure of date.   MINOR EXCISION OF ORAL LESION N/A 06/21/2020   Procedure: EXCISION OF TONGUE LESION WITH CLOSURE;  Surgeon: Rozetta Nunnery, MD;  Location: [place];  Service: ENT;  Laterality: N/A;   NASAL POLYP EXCISION     PARTIAL GLOSSECTOMY  01/15/2018   POSTERIOR LUMBAR FUSION 4 LEVEL N/A 08/07/2019   Procedure: Thoracic Ten to pelvis fixation;  Surgeon: Erline Levine,  MD;  Location: [place];  Service: Neurosurgery;  Laterality: N/A;  Thoracic Ten to pelvis fixation   SKIN CANCER EXCISION Left 11/2017   "thigh"   TONGUE SURGERY     TONSILLECTOMY     TUMMY TUCK      Family History  Problem Relation Age of Onset   Cancer Mother        ?lung cancer   Hypertension Sister    Diabetes Father    Stomach cancer Cousin    Rectal cancer Neg Hx    Esophageal cancer Neg Hx    Colon cancer Neg Hx     Social History   Socioeconomic History   Marital status: Married    Spouse name: Not on file   Number of children: Not on file   Years of education: Not on file   Highest education level: Not on file  Occupational History   Occupation: RETIRED  Tobacco Use   Smoking status: Never   Smokeless tobacco: Never  Vaping Use   Vaping Use: Never used  Substance and Sexual Activity   Alcohol use: Not Currently    Alcohol/week: 0.0 standard drinks   Drug use: Never   Sexual activity: Not Currently    Partners: Male    Birth control/protection: Surgical  Other Topics Concern   Not on file  Social History Narrative   Not on file   Social Determinants of Health   Financial Resource Strain: Not on file  Food Insecurity: Not on file  Transportation Needs: Not on file  Physical Activity: Not on file  Stress: Not on file  Social Connections: Not on file  Intimate Partner Violence: Not on file    Outpatient Medications Prior to Visit  Medication Sig Dispense Refill   acetaminophen (TYLENOL) 325 MG tablet Take 1-2 tablets (325-650 mg total) by mouth every 4 (four) hours as needed for mild pain.     Ascorbic Acid (VITAMIN C PO) Take 1 tablet by mouth daily.      b complex vitamins tablet Take 1 tablet by mouth daily.     calcium citrate (CALCITRATE - DOSED IN MG ELEMENTAL CALCIUM) 950 (200 Ca) MG tablet Take 1 tablet (200 mg of elemental calcium total) by mouth daily.     diphenhydrAMINE (BENADRYL) 25 MG tablet Take 25 mg by mouth at bedtime as needed for  sleep.     estradiol (ESTRACE) 0.5 MG tablet Take 1 tablet (0.5 mg total) by mouth daily. 90 tablet 3   levothyroxine (SYNTHROID) 75 MCG tablet Take one tablet by mouth each morning. 90 tablet 3   Multiple Vitamins-Minerals (HAIR/SKIN/NAILS/BIOTIN PO) Take 1 tablet by  mouth daily.     Multiple Vitamins-Minerals (MULTIVITAMIN WITH MINERALS) tablet Take 1 tablet by mouth daily.     Multiple Vitamins-Minerals (OSTEO COMPLEX PO) Take 1 tablet by mouth daily.      triamterene-hydrochlorothiazide (MAXZIDE) 75-50 MG tablet Take 1 tablet by mouth daily with breakfast. 90 tablet 3   VITAMIN D PO Take 1 tablet by mouth daily.      triamcinolone cream (KENALOG) 0.1 % Apply 1 application topically 2 (two) times daily as needed (irritation).      amoxicillin (AMOXIL) 875 MG tablet Take 1 tablet (875 mg total) by mouth 2 (two) times daily. (Patient not taking: Reported on 08/30/2020) 14 tablet 0   No facility-administered medications prior to visit.    Allergies  Allergen Reactions   Codeine Nausea Only    ROS Review of Systems  Constitutional:  Negative for chills and fever.  Skin:  Positive for rash.     Objective:    Physical Exam Vitals reviewed.  Constitutional:      Appearance: Normal appearance.  Cardiovascular:     Rate and Rhythm: Normal rate and regular rhythm.  Skin:    Comments: She has some scattered slightly raised minimally visible erythematous papules.  Nonscaly.  No vesicles.  No pustules.  Nontender.  These are scattered on the arms and forearms bilaterally as well as lower legs  Neurological:     Mental Status: She is alert.    BP 116/68 (BP Location: Right Arm, Patient Position: Sitting, Cuff Size: Normal)   Pulse 82   Temp 97.6 F (36.4 C) (Oral)   Ht 5\' 8"  (1.727 m)   Wt 160 lb 6.4 oz (72.8 kg)   SpO2 98%   BMI 24.39 kg/m  Wt Readings from Last 3 Encounters:  08/30/20 160 lb 6.4 oz (72.8 kg)  06/14/20 150 lb (68 kg)  01/01/20 150 lb 14.4 oz (68.4 kg)      There are no preventive care reminders to display for this patient.  There are no preventive care reminders to display for this patient.  Lab Results  Component Value Date   TSH 2.49 12/07/2019   Lab Results  Component Value Date   WBC 11.8 (H) 08/24/2019   HGB 10.2 (L) 08/24/2019   HCT 31.6 (L) 08/24/2019   MCV 94.6 08/24/2019   PLT 328 08/24/2019   Lab Results  Component Value Date   NA 139 08/24/2019   K 4.6 08/24/2019   CO2 31 08/24/2019   GLUCOSE 115 (H) 08/24/2019   BUN 23 08/24/2019   CREATININE 0.82 08/24/2019   BILITOT 0.6 08/11/2019   ALKPHOS 66 08/11/2019   AST 26 08/11/2019   ALT 22 08/11/2019   PROT 5.7 (L) 08/11/2019   ALBUMIN 2.4 (L) 08/11/2019   CALCIUM 8.5 (L) 08/24/2019   ANIONGAP 8 08/24/2019   GFR 70.76 10/21/2018   Lab Results  Component Value Date   CHOL 206 (H) 10/21/2018   Lab Results  Component Value Date   HDL 45.30 10/21/2018   Lab Results  Component Value Date   LDLCALC 140 (H) 10/21/2018   Lab Results  Component Value Date   TRIG 100.0 10/21/2018   Lab Results  Component Value Date   CHOLHDL 5 10/21/2018   No results found for: HGBA1C    Assessment & Plan:    Nonspecific pruritic rash.  Etiology unclear  -Recommend avoidance of sun and heat is much as possible -Avoid scratching as much as possible -Consider over-the-counter antihistamine such  as Zyrtec or Allegra for daytime use and may continue Benadryl at night -Triamcinolone 0.1% cream prescription sent in to use twice daily as needed  Meds ordered this encounter  Medications   triamcinolone cream (KENALOG) 0.1 %    Sig: Apply 1 application topically 2 (two) times daily as needed (irritation).    Dispense:  80 g    Refill:  1    Follow-up: No follow-ups on file.    Carolann Littler, MD

## 2020-09-14 DIAGNOSIS — M545 Low back pain, unspecified: Secondary | ICD-10-CM | POA: Diagnosis not present

## 2020-09-14 DIAGNOSIS — M4120 Other idiopathic scoliosis, site unspecified: Secondary | ICD-10-CM | POA: Diagnosis not present

## 2020-09-14 DIAGNOSIS — I10 Essential (primary) hypertension: Secondary | ICD-10-CM | POA: Diagnosis not present

## 2020-09-24 DIAGNOSIS — M419 Scoliosis, unspecified: Secondary | ICD-10-CM | POA: Diagnosis not present

## 2020-10-25 DIAGNOSIS — M419 Scoliosis, unspecified: Secondary | ICD-10-CM | POA: Diagnosis not present

## 2020-11-22 ENCOUNTER — Other Ambulatory Visit: Payer: Self-pay

## 2020-11-22 ENCOUNTER — Ambulatory Visit (INDEPENDENT_AMBULATORY_CARE_PROVIDER_SITE_OTHER): Payer: Medicare Other | Admitting: Otolaryngology

## 2020-11-22 DIAGNOSIS — Z85819 Personal history of malignant neoplasm of unspecified site of lip, oral cavity, and pharynx: Secondary | ICD-10-CM

## 2020-11-22 NOTE — Progress Notes (Signed)
HPI: Stacy Wagner is a 80 y.o. female who returns today for evaluation of T1 N0 squamous cell carcinoma of the right lateral tongue..  This was initially excised in November 2019.  She subsequent developed a recurrence in the same general area and had a second excision performed in April 2022.  She has done well since this excision which was totally excised with negative margins.  She does complain of some slight pain or discomfort in the region of excision.  Past Medical History:  Diagnosis Date   AC (acromioclavicular) joint bone spurs    Arthritis    "lower back" (01/15/2018)   Back pain    Bursitis disorder    bil shoulders   Cataracts, bilateral    Chronic lower back pain    Complication of anesthesia    some nausea in past with anesthesia, pt. also recalls having the "tube removed too early & her her throat was full of mucous, states that she could move any part of her body)   Family history of adverse reaction to anesthesia    "daughter gets PONV" (01/15/2018)   GERD (gastroesophageal reflux disease)    Hearing loss    bil / no hearing aids   Hypertension    Hypothyroidism    Idiopathic scoliosis    Leg pain    Lumbar radiculopathy    Lumbar stenosis with neurogenic claudication    Migraine    "used to have them all the time; none in 10 yrs" (01/15/2018)   Mouth sores    Neck pain    OA (osteoarthritis)    Multiple injections in lumbar region    PONV (postoperative nausea and vomiting)    Prolapse of female bladder, acquired    Sagittal plane imbalance    Skin cancer 11/2017   "left thigh"   Thyroid disease    Tongue cancer ([place])    12-2017   Past Surgical History:  Procedure Laterality Date   ABDOMINAL EXPOSURE N/A 08/04/2019   Procedure: ABDOMINAL EXPOSURE;  Surgeon: Angelia Mould, MD;  Location: [facility] OR;  Service: Vascular;  Laterality: N/A;   ABDOMINAL HYSTERECTOMY     uterine prolapse   ABDOMINOPLASTY     tummy tuck Emilio Aspen excess skin arms and  legs   ANTERIOR LATERAL LUMBAR FUSION 4 LEVELS Right 08/04/2019   Procedure: Lumbar One-Two, Lumbar Two-Three, Lumbar Three-Four, Lumbar Four-Five  Anterolateral Lumbar Interbody Fusion;  Surgeon: Erline Levine, MD;  Location: [place];  Service: Neurosurgery;  Laterality: Right;   ANTERIOR LUMBAR FUSION N/A 08/04/2019   Procedure: Lumbar Five - Sacral One  Anterior Lumbar Interbody Fusion;  Surgeon: Erline Levine, MD;  Location: [place];  Service: Neurosurgery;  Laterality: N/A;  Dr Deitra Mayo to assist   APPENDECTOMY     APPLICATION OF INTRAOPERATIVE CT SCAN N/A 08/07/2019   Procedure: APPLICATION OF INTRAOPERATIVE CT SCAN;  Surgeon: Erline Levine, MD;  Location: [place];  Service: Neurosurgery;  Laterality: N/A;   BACK SURGERY     BREAST SURGERY Right 1980   removal of a nodule- post trauma, benign pathology   CARPAL TUNNEL RELEASE Right    CATARACT EXTRACTION, BILATERAL     COLONOSCOPY     GLOSSECTOMY N/A 01/15/2018   Procedure: PARTIAL GLOSSECTOMY;  Surgeon: Rozetta Nunnery, MD;  Location[facility] OR;  Service: ENT;  Laterality: N/A;   LUMBAR SPINE SURGERY  1988   /w Dr. Joya Salm- reports that it was in the lumbar region, unsure of date.   MINOR  EXCISION OF ORAL LESION N/A 06/21/2020   Procedure: EXCISION OF TONGUE LESION WITH CLOSURE;  Surgeon: Rozetta Nunnery, MD;  Location: [place];  Service: ENT;  Laterality: N/A;   NASAL POLYP EXCISION     PARTIAL GLOSSECTOMY  01/15/2018   POSTERIOR LUMBAR FUSION 4 LEVEL N/A 08/07/2019   Procedure: Thoracic Ten to pelvis fixation;  Surgeon: Erline Levine, MD;  Location: [place];  Service: Neurosurgery;  Laterality: N/A;  Thoracic Ten to pelvis fixation   SKIN CANCER EXCISION Left 11/2017   "thigh"   TONGUE SURGERY     TONSILLECTOMY     TUMMY TUCK     Social History   Socioeconomic History   Marital status: Married    Spouse name: Not on file   Number of children: Not on file   Years of education: Not on file   Highest  education level: Not on file  Occupational History   Occupation: RETIRED  Tobacco Use   Smoking status: Never   Smokeless tobacco: Never  Vaping Use   Vaping Use: Never used  Substance and Sexual Activity   Alcohol use: Not Currently    Alcohol/week: 0.0 standard drinks   Drug use: Never   Sexual activity: Not Currently    Partners: Male    Birth control/protection: Surgical  Other Topics Concern   Not on file  Social History Narrative   Not on file   Social Determinants of Health   Financial Resource Strain: Not on file  Food Insecurity: Not on file  Transportation Needs: Not on file  Physical Activity: Not on file  Stress: Not on file  Social Connections: Not on file   Family History  Problem Relation Age of Onset   Cancer Mother        ?lung cancer   Hypertension Sister    Diabetes Father    Stomach cancer Cousin    Rectal cancer Neg Hx    Esophageal cancer Neg Hx    Colon cancer Neg Hx    Allergies  Allergen Reactions   Codeine Nausea Only   Prior to Admission medications   Medication Sig Start Date End Date Taking? Authorizing Provider  acetaminophen (TYLENOL) 325 MG tablet Take 1-2 tablets (325-650 mg total) by mouth every 4 (four) hours as needed for mild pain. 08/25/19   Love, Ivan Anchors, PA-C  amoxicillin (AMOXIL) 875 MG tablet Take 1 tablet (875 mg total) by mouth 2 (two) times daily. Patient not taking: Reported on 08/30/2020 06/21/20   Rozetta Nunnery, MD  Ascorbic Acid (VITAMIN C PO) Take 1 tablet by mouth daily.     [provider]  b complex vitamins tablet Take 1 tablet by mouth daily.    [provider]  calcium citrate (CALCITRATE - DOSED IN MG ELEMENTAL CALCIUM) 950 (200 Ca) MG tablet Take 1 tablet (200 mg of elemental calcium total) by mouth daily. 08/26/19   Love, Ivan Anchors, PA-C  diphenhydrAMINE (BENADRYL) 25 MG tablet Take 25 mg by mouth at bedtime as needed for sleep.    [provider]  estradiol (ESTRACE) 0.5 MG  tablet Take 1 tablet (0.5 mg total) by mouth daily. 01/01/20   Burchette, Alinda Sierras, MD  levothyroxine (SYNTHROID) 75 MCG tablet Take one tablet by mouth each morning. 01/01/20   Burchette, Alinda Sierras, MD  Multiple Vitamins-Minerals (HAIR/SKIN/NAILS/BIOTIN PO) Take 1 tablet by mouth daily.    [provider]  Multiple Vitamins-Minerals (MULTIVITAMIN WITH MINERALS) tablet Take 1 tablet by  mouth daily.    [provider]  Multiple Vitamins-Minerals (OSTEO COMPLEX PO) Take 1 tablet by mouth daily.     [provider]  triamcinolone cream (KENALOG) 0.1 % Apply 1 application topically 2 (two) times daily as needed (irritation). 08/30/20   Burchette, Alinda Sierras, MD  triamterene-hydrochlorothiazide (MAXZIDE) 75-50 MG tablet Take 1 tablet by mouth daily with breakfast. 01/01/20 01/30/21  Burchette, Alinda Sierras, MD  VITAMIN D PO Take 1 tablet by mouth daily.     [provider]     Positive ROS: Otherwise negative  All other systems have been reviewed and were otherwise negative with the exception of those mentioned in the HPI and as above.  Physical Exam: Constitutional: Alert, well-appearing, no acute distress Ears: External ears without lesions or tenderness. Ear canals are clear bilaterally with intact, clear TMs.  Nasal: External nose without lesions. . Clear nasal passages Oral: Lips and gums without lesions.  On close examination of the right lateral tongue.  There is no evidence of dysplasia or ulceration.  Palpation of this area reveals some scar tissue which is soft otherwise.  No palpable nodules.  Indirect laryngoscopy revealed a clear base of tongue vallecula and epiglottis. Neck: No palpable adenopathy or masses.  Do not appreciate any right submandibular or enlarged nodes along the jugular chain of lymph nodes. Respiratory: Breathing comfortably  Skin: No facial/neck lesions or rash noted.  Procedures  Assessment: Patient doing well 6 months status post surgical  excision of right lateral tongue squamous cell carcinoma T1N0.  Plan: Reassured patient of clear exam today. Recommended recheck or follow-up in 4 months.  Discussed with her that unfortunately I will be retiring and suggested follow-up with Dr. Constance Holster at [facility].   Radene Journey, MD

## 2020-11-25 DIAGNOSIS — M419 Scoliosis, unspecified: Secondary | ICD-10-CM | POA: Diagnosis not present

## 2020-12-25 DIAGNOSIS — M419 Scoliosis, unspecified: Secondary | ICD-10-CM | POA: Diagnosis not present

## 2021-01-11 DIAGNOSIS — H02834 Dermatochalasis of left upper eyelid: Secondary | ICD-10-CM | POA: Diagnosis not present

## 2021-01-11 DIAGNOSIS — H40013 Open angle with borderline findings, low risk, bilateral: Secondary | ICD-10-CM | POA: Diagnosis not present

## 2021-01-11 DIAGNOSIS — H26493 Other secondary cataract, bilateral: Secondary | ICD-10-CM | POA: Diagnosis not present

## 2021-01-11 DIAGNOSIS — H04123 Dry eye syndrome of bilateral lacrimal glands: Secondary | ICD-10-CM | POA: Diagnosis not present

## 2021-01-13 DIAGNOSIS — Z1231 Encounter for screening mammogram for malignant neoplasm of breast: Secondary | ICD-10-CM | POA: Diagnosis not present

## 2021-01-13 LAB — HM MAMMOGRAPHY

## 2021-01-16 ENCOUNTER — Encounter: Payer: Self-pay | Admitting: Family Medicine

## 2021-01-30 ENCOUNTER — Ambulatory Visit (INDEPENDENT_AMBULATORY_CARE_PROVIDER_SITE_OTHER): Payer: Medicare Other | Admitting: Family Medicine

## 2021-01-30 ENCOUNTER — Encounter: Payer: Self-pay | Admitting: Family Medicine

## 2021-01-30 VITALS — BP 130/80 | HR 79 | Temp 97.5°F | Ht 68.0 in | Wt 173.1 lb

## 2021-01-30 DIAGNOSIS — R739 Hyperglycemia, unspecified: Secondary | ICD-10-CM

## 2021-01-30 DIAGNOSIS — R6889 Other general symptoms and signs: Secondary | ICD-10-CM

## 2021-01-30 DIAGNOSIS — E039 Hypothyroidism, unspecified: Secondary | ICD-10-CM

## 2021-01-30 DIAGNOSIS — I10 Essential (primary) hypertension: Secondary | ICD-10-CM

## 2021-01-30 LAB — BASIC METABOLIC PANEL
BUN: 23 mg/dL (ref 6–23)
CO2: 29 mEq/L (ref 19–32)
Calcium: 9.5 mg/dL (ref 8.4–10.5)
Chloride: 104 mEq/L (ref 96–112)
Creatinine, Ser: 0.85 mg/dL (ref 0.40–1.20)
GFR: 65.69 mL/min (ref >60.00)
Glucose, Bld: 84 mg/dL (ref 70–99)
Potassium: 4.3 mEq/L (ref 3.5–5.1)
Sodium: 140 mEq/L (ref 135–145)

## 2021-01-30 LAB — HEMOGLOBIN A1C: Hgb A1c MFr Bld: 5.6 % (ref 4.6–6.5)

## 2021-01-30 LAB — TSH: TSH: 4.19 u[IU]/mL (ref 0.35–5.50)

## 2021-01-30 MED ORDER — TRIAMTERENE-HCTZ 75-50 MG PO TABS: 1.0000 | ORAL_TABLET | Freq: Every day | ORAL | 3 refills | Status: DC

## 2021-01-30 MED ORDER — LEVOTHYROXINE SODIUM 75 MCG PO TABS: ORAL_TABLET | ORAL | 3 refills | Status: DC

## 2021-01-30 MED ORDER — ESTRADIOL 0.5 MG PO TABS: 0.5000 mg | ORAL_TABLET | Freq: Every day | ORAL | 3 refills | Status: DC

## 2021-01-30 NOTE — Progress Notes (Signed)
Established Patient Office Visit  Subjective:  Patient ID: Stacy Wagner, female    DOB: 19-Jan-1941  Age: 80 y.o. MRN: [id number]  CC:  Chief Complaint  Patient presents with   Medication Refill    HPI Stacy Wagner presents for medical follow-up.  She has hypertension treated with Maxide.  Blood pressures been stable.  She has had slightly low potassium occasionally in the past.  She takes levothyroxine for hypothyroidism.  She has had some recent cold intolerance.  For the most part compliant with therapy.  She has been on low-dose Estrace for years and has been reluctant to stop.  She does get regular mammograms.  No history of any clotting issues.  She states she has a "sweet tooth ".  She has gained some weight during the past year.  She attributes this to poor dietary compliance.  Somewhat less physically active.  She has history of tongue cancer.  She has been followed closely by ENT.  Her ENT physician is retiring.  She will be transitioning to another ENT surgeon soon.  Past Medical History:  Diagnosis Date   AC (acromioclavicular) joint bone spurs    Arthritis    "lower back" (01/15/2018)   Back pain    Bursitis disorder    bil shoulders   Cataracts, bilateral    Chronic lower back pain    Complication of anesthesia    some nausea in past with anesthesia, pt. also recalls having the "tube removed too early & her her throat was full of mucous, states that she could move any part of her body)   Family history of adverse reaction to anesthesia    "daughter gets PONV" (01/15/2018)   GERD (gastroesophageal reflux disease)    Hearing loss    bil / no hearing aids   Hypertension    Hypothyroidism    Idiopathic scoliosis    Leg pain    Lumbar radiculopathy    Lumbar stenosis with neurogenic claudication    Migraine    "used to have them all the time; none in 10 yrs" (01/15/2018)   Mouth sores    Neck pain    OA (osteoarthritis)    Multiple injections in  lumbar region    PONV (postoperative nausea and vomiting)    Prolapse of female bladder, acquired    Sagittal plane imbalance    Skin cancer 11/2017   "left thigh"   Thyroid disease    Tongue cancer ([place])    12-2017    Past Surgical History:  Procedure Laterality Date   ABDOMINAL EXPOSURE N/A 08/04/2019   Procedure: ABDOMINAL EXPOSURE;  Surgeon: Angelia Mould, MD;  Location: [facility] OR;  Service: Vascular;  Laterality: N/A;   ABDOMINAL HYSTERECTOMY     uterine prolapse   ABDOMINOPLASTY     tummy tuck Emilio Aspen excess skin arms and legs   ANTERIOR LATERAL LUMBAR FUSION 4 LEVELS Right 08/04/2019   Procedure: Lumbar One-Two, Lumbar Two-Three, Lumbar Three-Four, Lumbar Four-Five  Anterolateral Lumbar Interbody Fusion;  Surgeon: Erline Levine, MD;  Location: [place];  Service: Neurosurgery;  Laterality: Right;   ANTERIOR LUMBAR FUSION N/A 08/04/2019   Procedure: Lumbar Five - Sacral One  Anterior Lumbar Interbody Fusion;  Surgeon: Erline Levine, MD;  Location: [place];  Service: Neurosurgery;  Laterality: N/A;  Dr Deitra Mayo to assist   APPENDECTOMY     APPLICATION OF INTRAOPERATIVE CT SCAN N/A 08/07/2019   Procedure: APPLICATION OF INTRAOPERATIVE CT SCAN;  Surgeon: Erline Levine, MD;  Location: [place] OR;  Service: Neurosurgery;  Laterality: N/A;   BACK SURGERY     BREAST SURGERY Right 1980   removal of a nodule- post trauma, benign pathology   CARPAL TUNNEL RELEASE Right    CATARACT EXTRACTION, BILATERAL     COLONOSCOPY     GLOSSECTOMY N/A 01/15/2018   Procedure: PARTIAL GLOSSECTOMY;  Surgeon: Rozetta Nunnery, MD;  Location: [facility] OR;  Service: ENT;  Laterality: N/A;   LUMBAR SPINE SURGERY  1988   /w Dr. Joya Salm- reports that it was in the lumbar region, unsure of date.   MINOR EXCISION OF ORAL LESION N/A 06/21/2020   Procedure: EXCISION OF TONGUE LESION WITH CLOSURE;  Surgeon: Rozetta Nunnery, MD;  Location: [place];  Service: ENT;  Laterality: N/A;   NASAL  POLYP EXCISION     PARTIAL GLOSSECTOMY  01/15/2018   POSTERIOR LUMBAR FUSION 4 LEVEL N/A 08/07/2019   Procedure: Thoracic Ten to pelvis fixation;  Surgeon: Erline Levine, MD;  Location: [place];  Service: Neurosurgery;  Laterality: N/A;  Thoracic Ten to pelvis fixation   SKIN CANCER EXCISION Left 11/2017   "thigh"   TONGUE SURGERY     TONSILLECTOMY     TUMMY TUCK      Family History  Problem Relation Age of Onset   Cancer Mother        ?lung cancer   Hypertension Sister    Diabetes Father    Stomach cancer Cousin    Rectal cancer Neg Hx    Esophageal cancer Neg Hx    Colon cancer Neg Hx     Social History   Socioeconomic History   Marital status: Married    Spouse name: Not on file   Number of children: Not on file   Years of education: Not on file   Highest education level: Not on file  Occupational History   Occupation: RETIRED  Tobacco Use   Smoking status: Never   Smokeless tobacco: Never  Vaping Use   Vaping Use: Never used  Substance and Sexual Activity   Alcohol use: Not Currently    Alcohol/week: 0.0 standard drinks   Drug use: Never   Sexual activity: Not Currently    Partners: Male    Birth control/protection: Surgical  Other Topics Concern   Not on file  Social History Narrative   Not on file   Social Determinants of Health   Financial Resource Strain: Not on file  Food Insecurity: Not on file  Transportation Needs: Not on file  Physical Activity: Not on file  Stress: Not on file  Social Connections: Not on file  Intimate Partner Violence: Not on file    Outpatient Medications Prior to Visit  Medication Sig Dispense Refill   acetaminophen (TYLENOL) 325 MG tablet Take 1-2 tablets (325-650 mg total) by mouth every 4 (four) hours as needed for mild pain.     Ascorbic Acid (VITAMIN C PO) Take 1 tablet by mouth daily.      b complex vitamins tablet Take 1 tablet by mouth daily.     calcium citrate (CALCITRATE - DOSED IN MG ELEMENTAL CALCIUM) 950  (200 Ca) MG tablet Take 1 tablet (200 mg of elemental calcium total) by mouth daily.     diphenhydrAMINE (BENADRYL) 25 MG tablet Take 25 mg by mouth at bedtime as needed for sleep.     Multiple Vitamins-Minerals (HAIR/SKIN/NAILS/BIOTIN PO) Take 1 tablet by mouth daily.     Multiple Vitamins-Minerals (MULTIVITAMIN WITH MINERALS) tablet  Take 1 tablet by mouth daily.     Multiple Vitamins-Minerals (OSTEO COMPLEX PO) Take 1 tablet by mouth daily.      triamcinolone cream (KENALOG) 0.1 % Apply 1 application topically 2 (two) times daily as needed (irritation). 80 g 1   VITAMIN D PO Take 1 tablet by mouth daily.      amoxicillin (AMOXIL) 875 MG tablet Take 1 tablet (875 mg total) by mouth 2 (two) times daily. 14 tablet 0   estradiol (ESTRACE) 0.5 MG tablet Take 1 tablet (0.5 mg total) by mouth daily. 90 tablet 3   levothyroxine (SYNTHROID) 75 MCG tablet Take one tablet by mouth each morning. 90 tablet 3   triamterene-hydrochlorothiazide (MAXZIDE) 75-50 MG tablet Take 1 tablet by mouth daily with breakfast. 90 tablet 3   No facility-administered medications prior to visit.    Allergies  Allergen Reactions   Codeine Nausea Only    ROS Review of Systems  Constitutional:  Negative for appetite change, fever and unexpected weight change.  Respiratory:  Negative for cough and shortness of breath.   Cardiovascular:  Negative for chest pain.  Gastrointestinal:  Negative for abdominal pain.  Endocrine: Positive for cold intolerance. Negative for polydipsia and polyuria.  Neurological:  Negative for dizziness and weakness.  Hematological:  Negative for adenopathy.     Objective:    Physical Exam Vitals reviewed.  Cardiovascular:     Rate and Rhythm: Normal rate and regular rhythm.  Pulmonary:     Effort: Pulmonary effort is normal.     Breath sounds: Normal breath sounds.  Musculoskeletal:     Right lower leg: No edema.     Left lower leg: No edema.  Lymphadenopathy:     Cervical: No  cervical adenopathy.  Neurological:     Mental Status: She is alert.    BP 130/80 (BP Location: Left Arm, Patient Position: Sitting, Cuff Size: Normal)   Pulse 79   Temp (!) 97.5 F (36.4 C) (Oral)   Ht 5\' 8"  (1.727 m)   Wt 173 lb 1.6 oz (78.5 kg)   SpO2 98%   BMI 26.32 kg/m  Wt Readings from Last 3 Encounters:  01/30/21 173 lb 1.6 oz (78.5 kg)  08/30/20 160 lb 6.4 oz (72.8 kg)  06/14/20 150 lb (68 kg)     Health Maintenance Due  Topic Date Due   Hepatitis C Screening  Never done   Zoster Vaccines- Shingrix (1 of 2) Never done   COVID-19 Vaccine (3 - Pfizer risk series) 04/25/2019    There are no preventive care reminders to display for this patient.  Lab Results  Component Value Date   TSH 2.49 12/07/2019   Lab Results  Component Value Date   WBC 11.8 (H) 08/24/2019   HGB 10.2 (L) 08/24/2019   HCT 31.6 (L) 08/24/2019   MCV 94.6 08/24/2019   PLT 328 08/24/2019   Lab Results  Component Value Date   NA 139 08/24/2019   K 4.6 08/24/2019   CO2 31 08/24/2019   GLUCOSE 115 (H) 08/24/2019   BUN 23 08/24/2019   CREATININE 0.82 08/24/2019   BILITOT 0.6 08/11/2019   ALKPHOS 66 08/11/2019   AST 26 08/11/2019   ALT 22 08/11/2019   PROT 5.7 (L) 08/11/2019   ALBUMIN 2.4 (L) 08/11/2019   CALCIUM 8.5 (L) 08/24/2019   ANIONGAP 8 08/24/2019   GFR 70.76 10/21/2018   Lab Results  Component Value Date   CHOL 206 (H) 10/21/2018   Lab Results  Component Value Date   HDL 45.30 10/21/2018   Lab Results  Component Value Date   LDLCALC 140 (H) 10/21/2018   Lab Results  Component Value Date   TRIG 100.0 10/21/2018   Lab Results  Component Value Date   CHOLHDL 5 10/21/2018   No results found for: HGBA1C    Assessment & Plan:   #1 hypertension stable.  Refill Maxide for 1 year.  Check basic metabolic panel.  #2 hypothyroidism.  Patient has had some recent weight gain and cold intolerance.  Recheck TSH.  Refill levothyroxine for 1 year.  #3 postmenopausal.   Patient has longstanding history of hot flashes.  She has been on low-dose estrogen and reluctant to stop.  She is getting regular mammograms.  Refilled Estrace low-dose 0.5 mg 1 daily for 1 year  #4 health maintenance.  Offered flu vaccine but she declines.  She is debating whether to get bivalent COVID-vaccine   Meds ordered this encounter  Medications   levothyroxine (SYNTHROID) 75 MCG tablet    Sig: Take one tablet by mouth each morning.    Dispense:  90 tablet    Refill:  3   triamterene-hydrochlorothiazide (MAXZIDE) 75-50 MG tablet    Sig: Take 1 tablet by mouth daily with breakfast.    Dispense:  90 tablet    Refill:  3   estradiol (ESTRACE) 0.5 MG tablet    Sig: Take 1 tablet (0.5 mg total) by mouth daily.    Dispense:  90 tablet    Refill:  3    Follow-up: No follow-ups on file.    Carolann Littler, MD

## 2021-02-28 DIAGNOSIS — D0471 Carcinoma in situ of skin of right lower limb, including hip: Secondary | ICD-10-CM | POA: Diagnosis not present

## 2021-02-28 DIAGNOSIS — Z85828 Personal history of other malignant neoplasm of skin: Secondary | ICD-10-CM | POA: Diagnosis not present

## 2021-02-28 DIAGNOSIS — D485 Neoplasm of uncertain behavior of skin: Secondary | ICD-10-CM | POA: Diagnosis not present

## 2021-02-28 DIAGNOSIS — L570 Actinic keratosis: Secondary | ICD-10-CM | POA: Diagnosis not present

## 2021-05-02 DIAGNOSIS — B078 Other viral warts: Secondary | ICD-10-CM | POA: Diagnosis not present

## 2021-05-02 DIAGNOSIS — L821 Other seborrheic keratosis: Secondary | ICD-10-CM | POA: Diagnosis not present

## 2021-05-02 DIAGNOSIS — Z85828 Personal history of other malignant neoplasm of skin: Secondary | ICD-10-CM | POA: Diagnosis not present

## 2021-05-02 DIAGNOSIS — L814 Other melanin hyperpigmentation: Secondary | ICD-10-CM | POA: Diagnosis not present

## 2021-05-23 DIAGNOSIS — C029 Malignant neoplasm of tongue, unspecified: Secondary | ICD-10-CM | POA: Diagnosis not present

## 2021-07-17 DIAGNOSIS — D0472 Carcinoma in situ of skin of left lower limb, including hip: Secondary | ICD-10-CM | POA: Diagnosis not present

## 2021-07-17 DIAGNOSIS — L570 Actinic keratosis: Secondary | ICD-10-CM | POA: Diagnosis not present

## 2021-07-17 DIAGNOSIS — L814 Other melanin hyperpigmentation: Secondary | ICD-10-CM | POA: Diagnosis not present

## 2021-07-17 DIAGNOSIS — L821 Other seborrheic keratosis: Secondary | ICD-10-CM | POA: Diagnosis not present

## 2021-07-17 DIAGNOSIS — D692 Other nonthrombocytopenic purpura: Secondary | ICD-10-CM | POA: Diagnosis not present

## 2021-08-03 DIAGNOSIS — D0472 Carcinoma in situ of skin of left lower limb, including hip: Secondary | ICD-10-CM | POA: Diagnosis not present

## 2021-08-24 DIAGNOSIS — C029 Malignant neoplasm of tongue, unspecified: Secondary | ICD-10-CM | POA: Diagnosis not present

## 2021-09-18 DIAGNOSIS — D0472 Carcinoma in situ of skin of left lower limb, including hip: Secondary | ICD-10-CM | POA: Diagnosis not present

## 2021-09-18 DIAGNOSIS — Z85828 Personal history of other malignant neoplasm of skin: Secondary | ICD-10-CM | POA: Diagnosis not present

## 2021-10-12 DIAGNOSIS — C029 Malignant neoplasm of tongue, unspecified: Secondary | ICD-10-CM | POA: Diagnosis not present

## 2021-11-20 DIAGNOSIS — L821 Other seborrheic keratosis: Secondary | ICD-10-CM | POA: Diagnosis not present

## 2021-11-20 DIAGNOSIS — B078 Other viral warts: Secondary | ICD-10-CM | POA: Diagnosis not present

## 2021-11-20 DIAGNOSIS — B353 Tinea pedis: Secondary | ICD-10-CM | POA: Diagnosis not present

## 2021-11-20 DIAGNOSIS — L570 Actinic keratosis: Secondary | ICD-10-CM | POA: Diagnosis not present

## 2021-12-12 DIAGNOSIS — C029 Malignant neoplasm of tongue, unspecified: Secondary | ICD-10-CM | POA: Diagnosis not present

## 2021-12-21 ENCOUNTER — Other Ambulatory Visit: Payer: Self-pay | Admitting: Family Medicine

## 2022-01-11 ENCOUNTER — Other Ambulatory Visit: Payer: Self-pay | Admitting: Family Medicine

## 2022-01-17 DIAGNOSIS — H04123 Dry eye syndrome of bilateral lacrimal glands: Secondary | ICD-10-CM | POA: Diagnosis not present

## 2022-01-17 DIAGNOSIS — H40013 Open angle with borderline findings, low risk, bilateral: Secondary | ICD-10-CM | POA: Diagnosis not present

## 2022-01-17 DIAGNOSIS — H26493 Other secondary cataract, bilateral: Secondary | ICD-10-CM | POA: Diagnosis not present

## 2022-01-17 DIAGNOSIS — H35363 Drusen (degenerative) of macula, bilateral: Secondary | ICD-10-CM | POA: Diagnosis not present

## 2022-01-22 DIAGNOSIS — Z1231 Encounter for screening mammogram for malignant neoplasm of breast: Secondary | ICD-10-CM | POA: Diagnosis not present

## 2022-01-22 LAB — HM MAMMOGRAPHY

## 2022-01-23 ENCOUNTER — Encounter: Payer: Self-pay | Admitting: Family Medicine

## 2022-01-23 DIAGNOSIS — B078 Other viral warts: Secondary | ICD-10-CM | POA: Diagnosis not present

## 2022-01-23 DIAGNOSIS — L720 Epidermal cyst: Secondary | ICD-10-CM | POA: Diagnosis not present

## 2022-01-23 DIAGNOSIS — L858 Other specified epidermal thickening: Secondary | ICD-10-CM | POA: Diagnosis not present

## 2022-01-23 DIAGNOSIS — L820 Inflamed seborrheic keratosis: Secondary | ICD-10-CM | POA: Diagnosis not present

## 2022-02-21 ENCOUNTER — Other Ambulatory Visit: Payer: Self-pay | Admitting: Family Medicine

## 2022-02-27 DIAGNOSIS — C029 Malignant neoplasm of tongue, unspecified: Secondary | ICD-10-CM | POA: Diagnosis not present

## 2022-03-07 DIAGNOSIS — Z780 Asymptomatic menopausal state: Secondary | ICD-10-CM | POA: Diagnosis not present

## 2022-03-07 LAB — HM DEXA SCAN

## 2022-03-08 ENCOUNTER — Encounter: Payer: Self-pay | Admitting: Family Medicine

## 2022-04-03 ENCOUNTER — Encounter: Payer: Self-pay | Admitting: Family Medicine

## 2022-04-03 ENCOUNTER — Ambulatory Visit (INDEPENDENT_AMBULATORY_CARE_PROVIDER_SITE_OTHER): Payer: Medicare Other | Admitting: Family Medicine

## 2022-04-03 VITALS — BP 178/82 | HR 95 | Temp 97.8°F | Ht 68.0 in | Wt 184.8 lb

## 2022-04-03 DIAGNOSIS — I10 Essential (primary) hypertension: Secondary | ICD-10-CM

## 2022-04-03 DIAGNOSIS — Z780 Asymptomatic menopausal state: Secondary | ICD-10-CM | POA: Diagnosis not present

## 2022-04-03 DIAGNOSIS — F4329 Adjustment disorder with other symptoms: Secondary | ICD-10-CM

## 2022-04-03 DIAGNOSIS — E039 Hypothyroidism, unspecified: Secondary | ICD-10-CM

## 2022-04-03 LAB — BASIC METABOLIC PANEL
BUN: 21 mg/dL (ref 6–23)
CO2: 24 mEq/L (ref 19–32)
Calcium: 10.4 mg/dL (ref 8.4–10.5)
Chloride: 100 mEq/L (ref 96–112)
Creatinine, Ser: 0.86 mg/dL (ref 0.40–1.20)
GFR: 64.24 mL/min (ref >60.00)
Glucose, Bld: 76 mg/dL (ref 70–99)
Potassium: 3.7 mEq/L (ref 3.5–5.1)
Sodium: 139 mEq/L (ref 135–145)

## 2022-04-03 LAB — TSH: TSH: 3.95 u[IU]/mL (ref 0.35–5.50)

## 2022-04-03 MED ORDER — SERTRALINE HCL 50 MG PO TABS: ORAL_TABLET | ORAL | 3 refills | Status: DC

## 2022-04-03 MED ORDER — LEVOTHYROXINE SODIUM 75 MCG PO TABS: 75.0000 ug | ORAL_TABLET | Freq: Every morning | ORAL | 3 refills | Status: DC

## 2022-04-03 MED ORDER — TRIAMTERENE-HCTZ 75-50 MG PO TABS: 1.0000 | ORAL_TABLET | Freq: Every day | ORAL | 3 refills | Status: DC

## 2022-04-03 MED ORDER — ESTRADIOL 0.5 MG PO TABS: 0.5000 mg | ORAL_TABLET | Freq: Every day | ORAL | 3 refills | Status: DC

## 2022-04-03 MED ORDER — AMLODIPINE BESYLATE 5 MG PO TABS: 5.0000 mg | ORAL_TABLET | Freq: Every day | ORAL | 3 refills | Status: DC

## 2022-04-03 NOTE — Progress Notes (Signed)
Established Patient Office Visit  Subjective   Patient ID: Stacy Wagner, female    DOB: 30-Aug-1940  Age: 82 y.o. MRN: [id number]  Chief Complaint  Patient presents with   Medication Consultation    HPI   Stacy Wagner is seen today for medical follow-up.  She has history of tongue cancer, hypertension, GERD, hypothyroidism, osteoarthritis.  She recently had established with a different ENT after her previous ENT retired.  She had noticed a recent sore on her tongue and was being scheduled for biopsy.  She started herself on B12 replacement and states that her sore tongue has basically resolved.  Her current medications are levothyroxine 75 mcg daily, estradiol 0.5 mg once daily, and Maxide 75/50 daily.  Her blood pressure is up today but she states this is generally well-controlled in the past.  She is overdue for labs.  She is reluctant to discontinue estradiol.  No history of blood clots.  Recent mammogram normal.  Recent DEXA scan was excellent and normal  Her greatest stress by far is the fact that her husband has dementia.  She is busy with him 24/7.  She has the stress of him being at her side constantly and he has been very reluctant to let her get out of sight which creates great stress for her.  She feels like she needs a respit at times.  She states she needs something "for her nerves ".  She does have some crying spells frequently and depression symptoms but mostly increased anxiety.  Low frustration tolerance at times.  Past Medical History:  Diagnosis Date   AC (acromioclavicular) joint bone spurs    Arthritis    "lower back" (01/15/2018)   Back pain    Bursitis disorder    bil shoulders   Cataracts, bilateral    Chronic lower back pain    Complication of anesthesia    some nausea in past with anesthesia, pt. also recalls having the "tube removed too early & her her throat was full of mucous, states that she could move any part of her body)   Family history of adverse  reaction to anesthesia    "daughter gets PONV" (01/15/2018)   GERD (gastroesophageal reflux disease)    Hearing loss    bil / no hearing aids   Hypertension    Hypothyroidism    Idiopathic scoliosis    Leg pain    Lumbar radiculopathy    Lumbar stenosis with neurogenic claudication    Migraine    "used to have them all the time; none in 10 yrs" (01/15/2018)   Mouth sores    Neck pain    OA (osteoarthritis)    Multiple injections in lumbar region    PONV (postoperative nausea and vomiting)    Prolapse of female bladder, acquired    Sagittal plane imbalance    Skin cancer 11/2017   "left thigh"   Thyroid disease    Tongue cancer ([place])    12-2017   Past Surgical History:  Procedure Laterality Date   ABDOMINAL EXPOSURE N/A 08/04/2019   Procedure: ABDOMINAL EXPOSURE;  Surgeon: Angelia Mould, MD;  Location: [facility] OR;  Service: Vascular;  Laterality: N/A;   ABDOMINAL HYSTERECTOMY     uterine prolapse   ABDOMINOPLASTY     tummy tuck Emilio Aspen excess skin arms and legs   ANTERIOR LATERAL LUMBAR FUSION 4 LEVELS Right 08/04/2019   Procedure: Lumbar One-Two, Lumbar Two-Three, Lumbar Three-Four, Lumbar Four-Five  Anterolateral Lumbar Interbody Fusion;  Surgeon: Vertell Limber,  Broadus John, MD;  Location: [place];  Service: Neurosurgery;  Laterality: Right;   ANTERIOR LUMBAR FUSION N/A 08/04/2019   Procedure: Lumbar Five - Sacral One  Anterior Lumbar Interbody Fusion;  Surgeon: Erline Levine, MD;  Location: [place];  Service: Neurosurgery;  Laterality: N/A;  Dr Deitra Mayo to assist   APPENDECTOMY     APPLICATION OF INTRAOPERATIVE CT SCAN N/A 08/07/2019   Procedure: APPLICATION OF INTRAOPERATIVE CT SCAN;  Surgeon: Erline Levine, MD;  Location: [place];  Service: Neurosurgery;  Laterality: N/A;   BACK SURGERY     BREAST SURGERY Right 1980   removal of a nodule- post trauma, benign pathology   CARPAL TUNNEL RELEASE Right    CATARACT EXTRACTION, BILATERAL     COLONOSCOPY     GLOSSECTOMY N/A 01/15/2018    Procedure: PARTIAL GLOSSECTOMY;  Surgeon: Rozetta Nunnery, MD;  Location: [facility] OR;  Service: ENT;  Laterality: N/A;   LUMBAR SPINE SURGERY  1988   /w Dr. Joya Salm- reports that it was in the lumbar region, unsure of date.   MINOR EXCISION OF ORAL LESION N/A 06/21/2020   Procedure: EXCISION OF TONGUE LESION WITH CLOSURE;  Surgeon: Rozetta Nunnery, MD;  Location: [place];  Service: ENT;  Laterality: N/A;   NASAL POLYP EXCISION     PARTIAL GLOSSECTOMY  01/15/2018   POSTERIOR LUMBAR FUSION 4 LEVEL N/A 08/07/2019   Procedure: Thoracic Ten to pelvis fixation;  Surgeon: Erline Levine, MD;  Location: [place];  Service: Neurosurgery;  Laterality: N/A;  Thoracic Ten to pelvis fixation   SKIN CANCER EXCISION Left 11/2017   "thigh"   TONGUE SURGERY     TONSILLECTOMY     TUMMY TUCK      reports that she has never smoked. She has never used smokeless tobacco. She reports that she does not currently use alcohol. She reports that she does not use drugs. family history includes Cancer in her mother; Diabetes in her father; Hypertension in her sister; Stomach cancer in her cousin. Allergies  Allergen Reactions   Codeine Nausea Only    Review of Systems  Constitutional:  Negative for malaise/fatigue.  Eyes:  Negative for blurred vision.  Respiratory:  Negative for shortness of breath.   Cardiovascular:  Negative for chest pain.  Neurological:  Negative for dizziness, weakness and headaches.  Psychiatric/Behavioral:  Positive for depression. Negative for suicidal ideas. The patient is nervous/anxious.       Objective:     BP (!) 178/82 (BP Location: Right Arm, Cuff Size: Normal)   Pulse 95   Temp 97.8 F (36.6 C) (Oral)   Ht '5\' 8"'$  (1.727 m)   Wt 184 lb 12.8 oz (83.8 kg)   SpO2 98%   BMI 28.10 kg/m    Physical Exam Vitals reviewed.  Constitutional:      Appearance: She is well-developed.  Eyes:     Pupils: Pupils are equal, round, and reactive to light.  Neck:      Thyroid: No thyromegaly.     Vascular: No JVD.  Cardiovascular:     Rate and Rhythm: Normal rate and regular rhythm.     Heart sounds:     No gallop.  Pulmonary:     Effort: Pulmonary effort is normal. No respiratory distress.     Breath sounds: Normal breath sounds. No wheezing or rales.  Musculoskeletal:     Cervical back: Neck supple.     Right lower leg: No edema.     Left lower  leg: No edema.  Neurological:     Mental Status: She is alert.      No results found for any visits on 04/03/22.    The ASCVD Risk score (Arnett DK, et al., 2019) failed to calculate for the following reasons:   Cannot find a previous HDL lab   Cannot find a previous total cholesterol lab    Assessment & Plan:   #1 hypothyroidism.  Overdue for labs.  Recheck TSH.  Refill levothyroxine 75 mcg daily  #2 postmenopausal.  Patient on estradiol for many years.  Recent DEXA scan excellent.  Recent mammogram normal.  No history of DVTs.  We refilled estradiol 0.5 mg once daily.  #3 hypertension  Very poorly controlled.  Repeat today right arm seated after rest 178/82.  Continue Maxide.  Add amlodipine 5 mg daily.  Check basic metabolic panel.  Low-sodium diet.  Set up 1 month follow-up to reassess  #4 stress and anxiety.  She has 24/7 responsibilities with her husband has dementia.  She is requesting something for depression and anxiety symptoms.  No suicidal ideation.  We discussed trial of sertraline 50 mg 1/2 tablet daily for 1 week then titrate to 50 mg daily and reassess in 3 to 4 weeks.  Handout on caregiver guide for Alzheimer's given   Return in about 1 month (around 05/02/2022).    Carolann Littler, MD

## 2022-05-02 ENCOUNTER — Encounter: Payer: Self-pay | Admitting: Family Medicine

## 2022-05-02 ENCOUNTER — Ambulatory Visit (INDEPENDENT_AMBULATORY_CARE_PROVIDER_SITE_OTHER): Payer: Medicare Other | Admitting: Family Medicine

## 2022-05-02 VITALS — BP 138/68 | HR 90 | Temp 97.7°F | Ht 68.0 in | Wt 190.2 lb

## 2022-05-02 DIAGNOSIS — F321 Major depressive disorder, single episode, moderate: Secondary | ICD-10-CM | POA: Diagnosis not present

## 2022-05-02 DIAGNOSIS — R635 Abnormal weight gain: Secondary | ICD-10-CM | POA: Diagnosis not present

## 2022-05-02 DIAGNOSIS — I10 Essential (primary) hypertension: Secondary | ICD-10-CM | POA: Diagnosis not present

## 2022-05-02 NOTE — Patient Instructions (Signed)
Blood pressure much improved  Try to lose some weight, as discussed.   Let's plan on 6 month follow up.

## 2022-05-02 NOTE — Progress Notes (Signed)
Established Patient Office Visit  Subjective   Patient ID: Stacy Wagner, female    DOB: 03/19/1940  Age: 82 y.o. MRN: [id number]  Chief Complaint  Patient presents with   Medical Management of Chronic Issues    HPI   Stacy Wagner is seen for medical follow-up.  Refer to note from last visit on 2-6/24 for details.  She had hypertension poorly controlled and we added amlodipine 5 mg daily to her Maxide.  Not monitoring regularly at home.  Denies any side effects.  Blood pressure much improved today.  No headaches.  No dizziness.  No alcohol use.  She was complaining of increased stress and anxiety symptoms and also frequent crying spells.  No suicidal ideation.  Stress of watching her husband who has Alzheimer's disease 24/7.  We started sertraline and she has seen great improvement at 50 mg daily.  No further crying spells.  Feels less anxious.  She is very pleased overall with this.  She has had some recent weight gain and knows she needs to lose some weight.  She attributes this mostly to high carb diet and not getting much consistent exercise.  He has history of tongue cancer and is followed by ENT regularly.  No recent new lesions.  Past Medical History:  Diagnosis Date   AC (acromioclavicular) joint bone spurs    Arthritis    "lower back" (01/15/2018)   Back pain    Bursitis disorder    bil shoulders   Cataracts, bilateral    Chronic lower back pain    Complication of anesthesia    some nausea in past with anesthesia, pt. also recalls having the "tube removed too early & her her throat was full of mucous, states that she could move any part of her body)   Family history of adverse reaction to anesthesia    "daughter gets PONV" (01/15/2018)   GERD (gastroesophageal reflux disease)    Hearing loss    bil / no hearing aids   Hypertension    Hypothyroidism    Idiopathic scoliosis    Leg pain    Lumbar radiculopathy    Lumbar stenosis with neurogenic claudication     Migraine    "used to have them all the time; none in 10 yrs" (01/15/2018)   Mouth sores    Neck pain    OA (osteoarthritis)    Multiple injections in lumbar region    PONV (postoperative nausea and vomiting)    Prolapse of female bladder, acquired    Sagittal plane imbalance    Skin cancer 11/2017   "left thigh"   Thyroid disease    Tongue cancer ([place])    12-2017   Past Surgical History:  Procedure Laterality Date   ABDOMINAL EXPOSURE N/A 08/04/2019   Procedure: ABDOMINAL EXPOSURE;  Surgeon: Angelia Mould, MD;  Location: [facility] OR;  Service: Vascular;  Laterality: N/A;   ABDOMINAL HYSTERECTOMY     uterine prolapse   ABDOMINOPLASTY     tummy tuck Emilio Aspen excess skin arms and legs   ANTERIOR LATERAL LUMBAR FUSION 4 LEVELS Right 08/04/2019   Procedure: Lumbar One-Two, Lumbar Two-Three, Lumbar Three-Four, Lumbar Four-Five  Anterolateral Lumbar Interbody Fusion;  Surgeon: Erline Levine, MD;  Location: [place];  Service: Neurosurgery;  Laterality: Right;   ANTERIOR LUMBAR FUSION N/A 08/04/2019   Procedure: Lumbar Five - Sacral One  Anterior Lumbar Interbody Fusion;  Surgeon: Erline Levine, MD;  Location: [place];  Service: Neurosurgery;  Laterality: N/A;  Dr  Deitra Mayo to assist   APPENDECTOMY     APPLICATION OF INTRAOPERATIVE CT SCAN N/A 08/07/2019   Procedure: APPLICATION OF INTRAOPERATIVE CT SCAN;  Surgeon: Erline Levine, MD;  Location: [place];  Service: Neurosurgery;  Laterality: N/A;   BACK SURGERY     BREAST SURGERY Right 1980   removal of a nodule- post trauma, benign pathology   CARPAL TUNNEL RELEASE Right    CATARACT EXTRACTION, BILATERAL     COLONOSCOPY     GLOSSECTOMY N/A 01/15/2018   Procedure: PARTIAL GLOSSECTOMY;  Surgeon: Rozetta Nunnery, MD;  Location: [facility] OR;  Service: ENT;  Laterality: N/A;   LUMBAR SPINE SURGERY  1988   /w Dr. Joya Salm- reports that it was in the lumbar region, unsure of date.   MINOR EXCISION OF ORAL LESION N/A 06/21/2020   Procedure:  EXCISION OF TONGUE LESION WITH CLOSURE;  Surgeon: Rozetta Nunnery, MD;  Location: [place];  Service: ENT;  Laterality: N/A;   NASAL POLYP EXCISION     PARTIAL GLOSSECTOMY  01/15/2018   POSTERIOR LUMBAR FUSION 4 LEVEL N/A 08/07/2019   Procedure: Thoracic Ten to pelvis fixation;  Surgeon: Erline Levine, MD;  Location: [place];  Service: Neurosurgery;  Laterality: N/A;  Thoracic Ten to pelvis fixation   SKIN CANCER EXCISION Left 11/2017   "thigh"   TONGUE SURGERY     TONSILLECTOMY     TUMMY TUCK      reports that she has never smoked. She has never used smokeless tobacco. She reports that she does not currently use alcohol. She reports that she does not use drugs. family history includes Cancer in her mother; Diabetes in her father; Hypertension in her sister; Stomach cancer in her cousin. Allergies  Allergen Reactions   Codeine Nausea Only    Review of Systems  Constitutional:  Negative for malaise/fatigue.  Eyes:  Negative for blurred vision.  Respiratory:  Negative for shortness of breath.   Cardiovascular:  Negative for chest pain.  Neurological:  Negative for dizziness, weakness and headaches.      Objective:     BP 138/68 (BP Location: Left Arm, Cuff Size: Normal)   Pulse 90   Temp 97.7 F (36.5 C) (Oral)   Ht '5\' 8"'$  (1.727 m)   Wt 190 lb 3.2 oz (86.3 kg)   SpO2 98%   BMI 28.92 kg/m    Physical Exam Vitals reviewed.  Constitutional:      Appearance: She is well-developed.  Eyes:     Pupils: Pupils are equal, round, and reactive to light.  Neck:     Thyroid: No thyromegaly.     Vascular: No JVD.  Cardiovascular:     Rate and Rhythm: Normal rate and regular rhythm.     Heart sounds:     No gallop.  Pulmonary:     Effort: Pulmonary effort is normal. No respiratory distress.     Breath sounds: Normal breath sounds. No wheezing or rales.  Musculoskeletal:     Cervical back: Neck supple.     Right lower leg: No edema.     Left lower leg: No  edema.  Neurological:     Mental Status: She is alert.      No results found for any visits on 05/02/22.    The ASCVD Risk score (Arnett DK, et al., 2019) failed to calculate for the following reasons:   Cannot find a previous HDL lab   Cannot find a previous total cholesterol lab  Assessment & Plan:   #1 hypertension much improved with recent addition of amlodipine 5 mg daily to her Maxide.  Continue current dosage.  We have strongly recommend she try to lose some weight which should help further.  Reassess in 6 months. Also stressed importance of regular exercise such as walking  #2 stress and anxiety symptoms.  She also had some depression symptoms as well all improved with sertraline 50 mg daily.  Continue current dosage.  #3 weight gain.  We discussed strategies for losing body fat with strategies including intermittent fasting, overall reduction in high glycemic foods, and prolonged exercise as tolerated.   Carolann Littler, MD

## 2022-06-04 ENCOUNTER — Other Ambulatory Visit: Payer: Self-pay | Admitting: Family Medicine

## 2022-07-24 DIAGNOSIS — B353 Tinea pedis: Secondary | ICD-10-CM | POA: Diagnosis not present

## 2022-07-24 DIAGNOSIS — L570 Actinic keratosis: Secondary | ICD-10-CM | POA: Diagnosis not present

## 2022-07-31 ENCOUNTER — Other Ambulatory Visit: Payer: Self-pay | Admitting: Family Medicine

## 2022-08-23 DIAGNOSIS — C029 Malignant neoplasm of tongue, unspecified: Secondary | ICD-10-CM | POA: Diagnosis not present

## 2022-08-27 ENCOUNTER — Encounter (HOSPITAL_COMMUNITY): Payer: Self-pay | Admitting: Otolaryngology

## 2022-08-27 NOTE — Progress Notes (Signed)
Spoke with pt for pre-op call. Pt denies cardiac history or Diabetes. Pt is treated for HTN.   Shower instructions given to pt and she voiced understanding.   Pt's husband, Stacy Wagner will be here with pt, but he has Alzheimer's and pt's friend Stacy Wagner will be with him while pt is in surgery.

## 2022-08-27 NOTE — Anesthesia Preprocedure Evaluation (Signed)
Anesthesia Evaluation  Patient identified by MRN, date of birth, ID band Patient awake    Reviewed: Allergy & Precautions, NPO status , Patient's Chart, lab work & pertinent test results  History of Anesthesia Complications (+) PONV and history of anesthetic complications  Airway Mallampati: III  TM Distance: >3 FB Neck ROM: Full    Dental  (+) Missing,    Pulmonary neg pulmonary ROS   Pulmonary exam normal        Cardiovascular hypertension, Pt. on medications Normal cardiovascular exam     Neuro/Psych  Headaches  Anxiety        GI/Hepatic Neg liver ROS,GERD  ,,  Endo/Other  Hypothyroidism    Renal/GU negative Renal ROS  negative genitourinary   Musculoskeletal  (+) Arthritis ,    Abdominal   Peds  Hematology negative hematology ROS (+)   Anesthesia Other Findings Day of surgery medications reviewed with patient.  Reproductive/Obstetrics negative OB ROS                             Anesthesia Physical Anesthesia Plan  ASA: 3  Anesthesia Plan: General   Post-op Pain Management: Tylenol PO (pre-op)*   Induction: Intravenous  PONV Risk Score and Plan: 4 or greater and Treatment may vary due to age or medical condition, Dexamethasone, Ondansetron, Propofol infusion and TIVA  Airway Management Planned: Nasal ETT  Additional Equipment: None  Intra-op Plan:   Post-operative Plan: Extubation in OR  Informed Consent: I have reviewed the patients History and Physical, chart, labs and discussed the procedure including the risks, benefits and alternatives for the proposed anesthesia with the patient or authorized representative who has indicated his/her understanding and acceptance.     Dental advisory given  Plan Discussed with: CRNA  Anesthesia Plan Comments:        Anesthesia Quick Evaluation

## 2022-08-27 NOTE — H&P (Signed)
HPI:  Stacy Wagner is a 82 y.o. female who presents as a return Patient.  Current problem: Oral cancer.  HPI: For about a month now she has had a sore spot in the back of the tongue on the right side. It actually started feeling a little better today. She has been palpating her neck and has not felt any lumps.  PMH/Meds/All/SocHx/FamHx/ROS:  Past Medical History: Diagnosis Date  Cancer (HCC)  Hearing loss  Past Surgical History: Procedure Laterality Date  BACK SURGERY  TONGUE SURGERY  No family history of bleeding disorders, wound healing problems or difficulty with anesthesia.  Social History  Socioeconomic History  Marital status: Married Spouse name: Not on file  Number of children: Not on file  Years of education: Not on file  Highest education level: Not on file Occupational History  Not on file Tobacco Use  Smoking status: Never  Smokeless tobacco: Never Substance and Sexual Activity  Alcohol use: Never  Drug use: Not on file  Sexual activity: Not on file Other Topics Concern  Not on file Social History Narrative  Not on file  Social Determinants of Health  Food Insecurity: Not on file Transportation Needs: Not on file Living Situation: Not on file  Current Outpatient Medications:  b complex vitamins tablet, Take 1 tablet by mouth daily., Disp: , Rfl:  chlorhexidine (PERIDEX) 0.12 % solution, , Disp: , Rfl:  cyclobenzaprine (FLEXERIL) 10 MG tablet, , Disp: , Rfl:  diphenhydrAMINE (BENADRYL) 25 mg tablet, Take 1 tablet (25 mg total) by mouth., Disp: , Rfl:  estradioL (ESTRACE) 0.5 MG tablet, , Disp: , Rfl:  estrogen-methyltestosterone (ESTRATEST) 1.25-2.5 mg per tablet, esterified estrogens-methyltestosterone 1.25 mg-2.5 mg tablet TK 1 T PO D, Disp: , Rfl:  levothyroxine (SYNTHROID) 75 MCG tablet, levothyroxine 75 mcg tablet, Disp: , Rfl:  lidocaine (XYLOCAINE VISCOUS) 2 % Solution oral topical solution, , Disp: , Rfl:  multivitamin with minerals  tablet, Take 1 tablet by mouth daily., Disp: , Rfl:  triamcinolone (KENALOG) 0.1 % cream, , Disp: , Rfl:  triamterene-hydrochlorothiazide (MAXZIDE) 75-50 mg per tablet, triamterene 75 mg-hydrochlorothiazide 50 mg tablet, Disp: , Rfl:   Physical Exam:  Healthy-appearing lady in no distress. Breathing and voice are clear. Oral cavity and pharynx reveals healthy mobile tongue with some patchy leukoplakia with slight firmness and tenderness along the right posterior oral tongue lateral area adjacent to the floor of mouth. There is no deep mass palpable. No other findings in the oral cavity or pharynx. No palpable adenopathy.  Independent Review of Additional Tests or Records: none  Procedures: none  Impression & Plans: Given her history I would recommend we do exam under anesthesia with multiple biopsies and if any are positive then a partial glossectomy with primary closure.

## 2022-08-28 ENCOUNTER — Encounter (HOSPITAL_COMMUNITY)
Admission: RE | Disposition: A | Discharge status: Home / Self Care | Payer: Self-pay | Source: Home / Self Care | Attending: Otolaryngology

## 2022-08-28 ENCOUNTER — Inpatient Hospital Stay (HOSPITAL_COMMUNITY): Payer: Medicare Other | Admitting: Certified Registered Nurse Anesthetist

## 2022-08-28 ENCOUNTER — Encounter (HOSPITAL_COMMUNITY): Payer: Self-pay | Admitting: Otolaryngology

## 2022-08-28 ENCOUNTER — Other Ambulatory Visit: Payer: Self-pay

## 2022-08-28 ENCOUNTER — Ambulatory Visit (HOSPITAL_COMMUNITY)
Admission: RE | Admit: 2022-08-28 | Discharge: 2022-08-28 | Disposition: A | Discharge status: Home / Self Care | Payer: Medicare Other | Attending: Otolaryngology | Admitting: Otolaryngology

## 2022-08-28 DIAGNOSIS — K135 Oral submucous fibrosis: Secondary | ICD-10-CM | POA: Insufficient documentation

## 2022-08-28 DIAGNOSIS — Z79890 Hormone replacement therapy (postmenopausal): Secondary | ICD-10-CM | POA: Diagnosis not present

## 2022-08-28 DIAGNOSIS — E039 Hypothyroidism, unspecified: Secondary | ICD-10-CM | POA: Insufficient documentation

## 2022-08-28 DIAGNOSIS — I10 Essential (primary) hypertension: Secondary | ICD-10-CM | POA: Diagnosis not present

## 2022-08-28 DIAGNOSIS — M545 Low back pain, unspecified: Secondary | ICD-10-CM | POA: Diagnosis not present

## 2022-08-28 DIAGNOSIS — Z79899 Other long term (current) drug therapy: Secondary | ICD-10-CM | POA: Insufficient documentation

## 2022-08-28 DIAGNOSIS — K140 Glossitis: Secondary | ICD-10-CM | POA: Diagnosis not present

## 2022-08-28 DIAGNOSIS — C029 Malignant neoplasm of tongue, unspecified: Secondary | ICD-10-CM | POA: Diagnosis not present

## 2022-08-28 DIAGNOSIS — F419 Anxiety disorder, unspecified: Secondary | ICD-10-CM | POA: Diagnosis not present

## 2022-08-28 DIAGNOSIS — E785 Hyperlipidemia, unspecified: Secondary | ICD-10-CM

## 2022-08-28 DIAGNOSIS — Z85810 Personal history of malignant neoplasm of tongue: Secondary | ICD-10-CM | POA: Insufficient documentation

## 2022-08-28 DIAGNOSIS — C021 Malignant neoplasm of border of tongue: Secondary | ICD-10-CM

## 2022-08-28 DIAGNOSIS — G8929 Other chronic pain: Secondary | ICD-10-CM | POA: Diagnosis not present

## 2022-08-28 DIAGNOSIS — K149 Disease of tongue, unspecified: Secondary | ICD-10-CM | POA: Insufficient documentation

## 2022-08-28 DIAGNOSIS — K219 Gastro-esophageal reflux disease without esophagitis: Secondary | ICD-10-CM | POA: Insufficient documentation

## 2022-08-28 DIAGNOSIS — M1990 Unspecified osteoarthritis, unspecified site: Secondary | ICD-10-CM | POA: Insufficient documentation

## 2022-08-28 DIAGNOSIS — Z85828 Personal history of other malignant neoplasm of skin: Secondary | ICD-10-CM | POA: Insufficient documentation

## 2022-08-28 HISTORY — DX: Anxiety disorder, unspecified: F41.9

## 2022-08-28 LAB — CBC
HCT: 37.7 % (ref 36.0–46.0)
Hemoglobin: 12.3 g/dL (ref 12.0–15.0)
MCH: 29.9 pg (ref 26.0–34.0)
MCHC: 32.6 g/dL (ref 30.0–36.0)
MCV: 91.5 fL (ref 80.0–100.0)
Platelets: 290 10*3/uL (ref 150–400)
RBC: 4.12 MIL/uL (ref 3.87–5.11)
RDW: 13.5 % (ref 11.5–15.5)
WBC: 10.3 10*3/uL (ref 4.0–10.5)
nRBC: 0 % (ref 0.0–0.2)

## 2022-08-28 LAB — BASIC METABOLIC PANEL
Anion gap: 14 (ref 5–15)
BUN: 20 mg/dL (ref 8–23)
CO2: 22 mmol/L (ref 22–32)
Calcium: 8.8 mg/dL — ABNORMAL LOW (ref 8.9–10.3)
Chloride: 101 mmol/L (ref 98–111)
Creatinine, Ser: 1.07 mg/dL — ABNORMAL HIGH (ref 0.44–1.00)
GFR, Estimated: 53 mL/min — ABNORMAL LOW (ref >60)
Glucose, Bld: 91 mg/dL (ref 70–99)
Potassium: 3.5 mmol/L (ref 3.5–5.1)
Sodium: 137 mmol/L (ref 135–145)

## 2022-08-28 SURGERY — GLOSSECTOMY, PARTIAL
Anesthesia: General | Site: Mouth | Laterality: Right

## 2022-08-28 MED ORDER — ONDANSETRON 4 MG PO TBDP: 4.0000 mg | ORAL_TABLET | Freq: Three times a day (TID) | ORAL | 0 refills | Status: AC | PRN

## 2022-08-28 MED ORDER — LACTATED RINGERS IV SOLN: INTRAVENOUS | Status: DC

## 2022-08-28 MED ORDER — OXYCODONE HCL 5 MG/5ML PO SOLN
5.0000 mg | Freq: Once | ORAL | Status: AC | PRN
  Administered 2022-08-28: 5 mg via ORAL

## 2022-08-28 MED ORDER — LIDOCAINE 2% (20 MG/ML) 5 ML SYRINGE
INTRAMUSCULAR | Status: DC | PRN
  Administered 2022-08-28: 100 mg via INTRAVENOUS

## 2022-08-28 MED ORDER — CHLORHEXIDINE GLUCONATE 0.12 % MT SOLN: 15.0000 mL | Freq: Once | OROMUCOSAL | Status: AC

## 2022-08-28 MED ORDER — ACETAMINOPHEN 500 MG PO TABS
ORAL_TABLET | ORAL | Status: AC
  Administered 2022-08-28: 1000 mg via ORAL
  Filled 2022-08-28: qty 2

## 2022-08-28 MED ORDER — LIDOCAINE-EPINEPHRINE 1 %-1:100000 IJ SOLN
INTRAMUSCULAR | Status: AC
  Filled 2022-08-28: qty 1

## 2022-08-28 MED ORDER — ACETAMINOPHEN 500 MG PO TABS: 1000.0000 mg | ORAL_TABLET | Freq: Once | ORAL | Status: AC

## 2022-08-28 MED ORDER — PHENYLEPHRINE HCL-NACL 20-0.9 MG/250ML-% IV SOLN
INTRAVENOUS | Status: DC | PRN
  Administered 2022-08-28: 20 ug/min via INTRAVENOUS

## 2022-08-28 MED ORDER — ROCURONIUM BROMIDE 10 MG/ML (PF) SYRINGE
PREFILLED_SYRINGE | INTRAVENOUS | Status: AC
  Filled 2022-08-28: qty 10

## 2022-08-28 MED ORDER — OXYMETAZOLINE HCL 0.05 % NA SOLN
NASAL | Status: AC
  Filled 2022-08-28: qty 30

## 2022-08-28 MED ORDER — OXYCODONE HCL 5 MG PO TABS: 5.0000 mg | ORAL_TABLET | Freq: Once | ORAL | Status: AC | PRN

## 2022-08-28 MED ORDER — 0.9 % SODIUM CHLORIDE (POUR BTL) OPTIME
TOPICAL | Status: DC | PRN
  Administered 2022-08-28: 1000 mL

## 2022-08-28 MED ORDER — OXYMETAZOLINE HCL 0.05 % NA SOLN: 2.0000 | NASAL | Status: DC

## 2022-08-28 MED ORDER — ONDANSETRON HCL 4 MG/2ML IJ SOLN
INTRAMUSCULAR | Status: DC | PRN
  Administered 2022-08-28: 4 mg via INTRAVENOUS

## 2022-08-28 MED ORDER — CLINDAMYCIN PHOSPHATE 600 MG/50ML IV SOLN
600.0000 mg | Freq: Once | INTRAVENOUS | Status: AC
  Administered 2022-08-28: 600 mg via INTRAVENOUS

## 2022-08-28 MED ORDER — SUGAMMADEX SODIUM 200 MG/2ML IV SOLN
INTRAVENOUS | Status: DC | PRN
  Administered 2022-08-28: 162.4 mg via INTRAVENOUS

## 2022-08-28 MED ORDER — ROCURONIUM BROMIDE 10 MG/ML (PF) SYRINGE
PREFILLED_SYRINGE | INTRAVENOUS | Status: DC | PRN
  Administered 2022-08-28: 10 mg via INTRAVENOUS
  Administered 2022-08-28: 50 mg via INTRAVENOUS
  Administered 2022-08-28: 10 mg via INTRAVENOUS

## 2022-08-28 MED ORDER — HYDROCODONE-ACETAMINOPHEN 7.5-325 MG/15ML PO SOLN: 15.0000 mL | Freq: Four times a day (QID) | ORAL | 0 refills | Status: DC | PRN

## 2022-08-28 MED ORDER — FENTANYL CITRATE (PF) 100 MCG/2ML IJ SOLN
INTRAMUSCULAR | Status: AC
  Filled 2022-08-28: qty 2

## 2022-08-28 MED ORDER — DEXAMETHASONE SODIUM PHOSPHATE 10 MG/ML IJ SOLN
INTRAMUSCULAR | Status: AC
  Filled 2022-08-28: qty 1

## 2022-08-28 MED ORDER — PROPOFOL 500 MG/50ML IV EMUL
INTRAVENOUS | Status: DC | PRN
  Administered 2022-08-28: 100 ug/kg/min via INTRAVENOUS

## 2022-08-28 MED ORDER — CLINDAMYCIN PHOSPHATE 600 MG/50ML IV SOLN
INTRAVENOUS | Status: AC
  Filled 2022-08-28: qty 50

## 2022-08-28 MED ORDER — FENTANYL CITRATE (PF) 100 MCG/2ML IJ SOLN
25.0000 ug | INTRAMUSCULAR | Status: DC | PRN
  Administered 2022-08-28 (×3): 25 ug via INTRAVENOUS

## 2022-08-28 MED ORDER — CHLORHEXIDINE GLUCONATE 0.12 % MT SOLN
OROMUCOSAL | Status: AC
  Administered 2022-08-28: 15 mL via OROMUCOSAL
  Filled 2022-08-28: qty 15

## 2022-08-28 MED ORDER — CLINDAMYCIN HCL 300 MG PO CAPS: 300.0000 mg | ORAL_CAPSULE | Freq: Three times a day (TID) | ORAL | 0 refills | Status: DC

## 2022-08-28 MED ORDER — DEXAMETHASONE SODIUM PHOSPHATE 10 MG/ML IJ SOLN
INTRAMUSCULAR | Status: DC | PRN
  Administered 2022-08-28: 10 mg via INTRAVENOUS

## 2022-08-28 MED ORDER — LIDOCAINE 2% (20 MG/ML) 5 ML SYRINGE
INTRAMUSCULAR | Status: AC
  Filled 2022-08-28: qty 5

## 2022-08-28 MED ORDER — ONDANSETRON HCL 4 MG/2ML IJ SOLN
INTRAMUSCULAR | Status: AC
  Filled 2022-08-28: qty 2

## 2022-08-28 MED ORDER — PROPOFOL 10 MG/ML IV BOLUS
INTRAVENOUS | Status: DC | PRN
  Administered 2022-08-28: 140 mg via INTRAVENOUS

## 2022-08-28 MED ORDER — PROPOFOL 10 MG/ML IV BOLUS
INTRAVENOUS | Status: AC
  Filled 2022-08-28: qty 20

## 2022-08-28 MED ORDER — ORAL CARE MOUTH RINSE: 15.0000 mL | Freq: Once | OROMUCOSAL | Status: AC

## 2022-08-28 MED ORDER — FENTANYL CITRATE (PF) 250 MCG/5ML IJ SOLN
INTRAMUSCULAR | Status: DC | PRN
  Administered 2022-08-28 (×2): 25 ug via INTRAVENOUS
  Administered 2022-08-28: 100 ug via INTRAVENOUS

## 2022-08-28 MED ORDER — FENTANYL CITRATE (PF) 250 MCG/5ML IJ SOLN
INTRAMUSCULAR | Status: AC
  Filled 2022-08-28: qty 5

## 2022-08-28 MED ORDER — OXYMETAZOLINE HCL 0.05 % NA SOLN
NASAL | Status: AC
  Administered 2022-08-28: 2 via NASAL
  Filled 2022-08-28: qty 30

## 2022-08-28 MED ORDER — HYDROCODONE-ACETAMINOPHEN 7.5-325 MG PO TABS: 1.0000 | ORAL_TABLET | Freq: Four times a day (QID) | ORAL | 0 refills | Status: DC | PRN

## 2022-08-28 MED ORDER — OXYCODONE HCL 5 MG/5ML PO SOLN
ORAL | Status: AC
  Filled 2022-08-28: qty 5

## 2022-08-28 SURGICAL SUPPLY — 50 items
APPLIER CLIP 9.375 MED OPEN (MISCELLANEOUS)
APR CLP MED 9.3 20 MLT OPN (MISCELLANEOUS)
ATTRACTOMAT 16X20 MAGNETIC DRP (DRAPES) IMPLANT
BAG COUNTER SPONGE SURGICOUNT (BAG) ×1 IMPLANT
BAG SPNG CNTER NS LX DISP (BAG)
BLADE SURG 15 STRL LF DISP TIS (BLADE) IMPLANT
BLADE SURG 15 STRL SS (BLADE) ×1
CANISTER SUCT 3000ML PPV (MISCELLANEOUS) ×1 IMPLANT
CLEANER TIP ELECTROSURG 2X2 (MISCELLANEOUS) ×1 IMPLANT
CLIP APPLIE 9.375 MED OPEN (MISCELLANEOUS) IMPLANT
CNTNR URN SCR LID CUP LEK RST (MISCELLANEOUS) ×1 IMPLANT
CONT SPEC 4OZ STRL OR WHT (MISCELLANEOUS) ×1
COVER SURGICAL LIGHT HANDLE (MISCELLANEOUS) ×1 IMPLANT
DRAPE HALF SHEET 40X57 (DRAPES) ×2 IMPLANT
ELECT COATED BLADE 2.86 ST (ELECTRODE) ×1 IMPLANT
ELECT REM PT RETURN 9FT ADLT (ELECTROSURGICAL) ×1
ELECTRODE REM PT RTRN 9FT ADLT (ELECTROSURGICAL) ×1 IMPLANT
FORCEPS BIPOLAR SPETZLER 8 1.0 (NEUROSURGERY SUPPLIES) IMPLANT
GAUZE 4X4 16PLY ~~LOC~~+RFID DBL (SPONGE) ×1 IMPLANT
GAUZE SPONGE 4X4 12PLY STRL (GAUZE/BANDAGES/DRESSINGS) IMPLANT
GLOVE ECLIPSE 7.5 STRL STRAW (GLOVE) ×1 IMPLANT
GOWN STRL REUS W/ TWL LRG LVL3 (GOWN DISPOSABLE) ×2 IMPLANT
GOWN STRL REUS W/TWL LRG LVL3 (GOWN DISPOSABLE) ×2
KIT BASIN OR (CUSTOM PROCEDURE TRAY) ×1 IMPLANT
KIT TURNOVER KIT B (KITS) ×1 IMPLANT
LOCATOR NERVE 3 VOLT (DISPOSABLE) IMPLANT
NDL PRECISIONGLIDE 27X1.5 (NEEDLE) ×1 IMPLANT
NEEDLE PRECISIONGLIDE 27X1.5 (NEEDLE) ×1 IMPLANT
NS IRRIG 1000ML POUR BTL (IV SOLUTION) ×1 IMPLANT
PAD ARMBOARD 7.5X6 YLW CONV (MISCELLANEOUS) ×1 IMPLANT
PENCIL FOOT CONTROL (ELECTRODE) ×1 IMPLANT
POSITIONER HEAD DONUT 9IN (MISCELLANEOUS) ×1 IMPLANT
SPONGE INTESTINAL PEANUT (DISPOSABLE) IMPLANT
SPONGE T-LAP 18X18 ~~LOC~~+RFID (SPONGE) ×1 IMPLANT
SURGILUBE 2OZ TUBE FLIPTOP (MISCELLANEOUS) IMPLANT
SUT CHROMIC 3 0 PS 2 (SUTURE) ×3 IMPLANT
SUT SILK 0 FSL (SUTURE) ×1 IMPLANT
SUT SILK 2 0 PERMA HAND 18 BK (SUTURE) IMPLANT
SUT SILK 3 0 REEL (SUTURE) IMPLANT
SUT SILK 3 0 SH CR/8 (SUTURE) ×1 IMPLANT
SUT SILK 4 0 REEL (SUTURE) ×1 IMPLANT
SUT VIC AB 3-0 SH 27 (SUTURE) ×4
SUT VIC AB 3-0 SH 27XBRD (SUTURE) ×2 IMPLANT
SUT VIC AB 4-0 RB1 18 (SUTURE) IMPLANT
TOWEL GREEN STERILE FF (TOWEL DISPOSABLE) ×1 IMPLANT
TRAY ENT MC OR (CUSTOM PROCEDURE TRAY) ×1 IMPLANT
TRAY FOLEY MTR SLVR 14FR STAT (SET/KITS/TRAYS/PACK) IMPLANT
TUBE CONNECTING 12X1/4 (SUCTIONS) ×1 IMPLANT
UNDERPAD 30X36 HEAVY ABSORB (UNDERPADS AND DIAPERS) ×1 IMPLANT
WATER STERILE IRR 1000ML POUR (IV SOLUTION) ×1 IMPLANT

## 2022-08-28 NOTE — Transfer of Care (Signed)
Immediate Anesthesia Transfer of Care Note  Patient: Stacy Wagner  Procedure(s) Performed: PARTIAL GLOSSECTOMY WITH FROZEN SECTION (Right: Mouth)  Patient Location: PACU  Anesthesia Type:General  Level of Consciousness: awake and alert   Airway & Oxygen Therapy: Patient Spontanous Breathing and Patient connected to nasal cannula oxygen  Post-op Assessment: Report given to RN and Post -op Vital signs reviewed and stable  Post vital signs: Reviewed and stable  Last Vitals:  Vitals Value Taken Time  BP 121/64 08/28/22 1200  Temp 36.7 C 08/28/22 1157  Pulse 87 08/28/22 1210  Resp 17 08/28/22 1210  SpO2 95 % 08/28/22 1210  Vitals shown include unvalidated device data.  Last Pain:  Vitals:   08/28/22 1200  TempSrc:   PainSc: 0-No pain         Complications: No notable events documented.

## 2022-08-28 NOTE — Interval H&P Note (Signed)
History and Physical Interval Note:  08/28/2022 9:52 AM  Stacy Wagner  has presented today for surgery, with the diagnosis of Tongue cancer.  The various methods of treatment have been discussed with the patient and family. After consideration of risks, benefits and other options for treatment, the patient has consented to  Procedure(s): PARTIAL GLOSSECTOMY WITH FROZEN SECTION (Right) as a surgical intervention.  The patient's history has been reviewed, patient examined, no change in status, stable for surgery.  I have reviewed the patient's chart and labs.  Questions were answered to the patient's satisfaction.     Serena Colonel

## 2022-08-28 NOTE — Op Note (Addendum)
OPERATIVE REPORT  DATE OF SURGERY: 08/28/2022  PATIENT:  Stacy Wagner,  82 y.o. female  PRE-OPERATIVE DIAGNOSIS: Tongue lesion, right  POST-OPERATIVE DIAGNOSIS:  Tongue lesion, right  PROCEDURE:  Procedure(s): PARTIAL GLOSSECTOMY WITH FROZEN SECTION  SURGEON:  Susy Frizzle, MD  ASSISTANTS: None  ANESTHESIA:   General   EBL: 50 ml  DRAINS: None  LOCAL MEDICATIONS USED:  None  SPECIMEN: Right partial glossectomy, margins oriented with sutures, frozen sections all margins were negative.  COUNTS:  Correct  PROCEDURE DETAILS: The patient was taken to the operating room and placed on the operating table in the supine position. Following induction of general endotracheal anesthesia, using nasotracheal tube, the mouth was draped in a standard fashion.  Bite block was used on the left side.  A penetrating towel clip was used in the tip of the tongue to retract the tongue.  The lesion was identified on the right side.  Electrocautery was used to mark the proposed mucosal incisions all around allowing for adequate margins.  Entire defect was measured approximately 5 cm in greatest dimension.  Electrocautery was then used to perform the complete dissection.  Bipolar cautery was used for vessels when visualized and 4-0 silk ties were used on branches of the lingual artery.  The entire specimen was removed oriented and sent for frozen section analysis.  The defect was then reapproximated with interrupted inverted 3-0 Vicryl sutures.  The oral cavity and oropharynx were suctioned and irrigated.  Patient was awakened extubated and transferred to recovery in stable condition.    PATIENT DISPOSITION:  To PACU, stable

## 2022-08-28 NOTE — Anesthesia Postprocedure Evaluation (Signed)
Anesthesia Post Note  Patient: JANESHA DADDIO  Procedure(s) Performed: PARTIAL GLOSSECTOMY WITH FROZEN SECTION (Right: Mouth)     Patient location during evaluation: PACU Anesthesia Type: General Level of consciousness: awake and alert Pain management: pain level controlled Vital Signs Assessment: post-procedure vital signs reviewed and stable Respiratory status: spontaneous breathing, nonlabored ventilation and respiratory function stable Cardiovascular status: blood pressure returned to baseline Postop Assessment: no apparent nausea or vomiting Anesthetic complications: no   No notable events documented.  Last Vitals:  Vitals:   08/28/22 1300 08/28/22 1315  BP: (!) 133/56 125/65  Pulse: 90 87  Resp: 15 20  Temp:  36.7 C  SpO2: 93% 93%    Last Pain:  Vitals:   08/28/22 1300  TempSrc:   PainSc: 5                  Shanda Howells

## 2022-08-28 NOTE — Anesthesia Procedure Notes (Signed)
Procedure Name: Intubation Date/Time: 08/28/2022 10:13 AM  Performed by: Cy Blamer, CRNAPre-anesthesia Checklist: Patient identified, Emergency Drugs available, Suction available and Patient being monitored Patient Re-evaluated:Patient Re-evaluated prior to induction Oxygen Delivery Method: Circle system utilized Preoxygenation: Pre-oxygenation with 100% oxygen Induction Type: IV induction Ventilation: Mask ventilation without difficulty Laryngoscope Size: Glidescope and 3 Grade View: Grade I Nasal Tubes: Nasal prep performed and Right Tube size: 7.0 mm Number of attempts: 1 Placement Confirmation: ETT inserted through vocal cords under direct vision, positive ETCO2 and breath sounds checked- equal and bilateral Tube secured with: Tape Dental Injury: Teeth and Oropharynx as per pre-operative assessment  Comments: Surgeon request for nasal ETT, advanced nasal ETT into right nare using 16Fr red rubber catheter, visualized placement using Glidescope; lubricated prior to atraumatic placement.

## 2022-08-28 NOTE — Discharge Instructions (Signed)
Rinse mouth with salt water 3 times daily.  Resume diet as tolerated.  Brush teeth as you normally do.

## 2022-09-04 DIAGNOSIS — C029 Malignant neoplasm of tongue, unspecified: Secondary | ICD-10-CM | POA: Diagnosis not present

## 2022-09-05 LAB — SURGICAL PATHOLOGY

## 2022-09-18 DIAGNOSIS — C029 Malignant neoplasm of tongue, unspecified: Secondary | ICD-10-CM | POA: Diagnosis not present

## 2022-09-21 DIAGNOSIS — D692 Other nonthrombocytopenic purpura: Secondary | ICD-10-CM | POA: Diagnosis not present

## 2022-09-21 DIAGNOSIS — B353 Tinea pedis: Secondary | ICD-10-CM | POA: Diagnosis not present

## 2022-09-21 DIAGNOSIS — B078 Other viral warts: Secondary | ICD-10-CM | POA: Diagnosis not present

## 2022-09-21 DIAGNOSIS — C44319 Basal cell carcinoma of skin of other parts of face: Secondary | ICD-10-CM | POA: Diagnosis not present

## 2022-09-21 DIAGNOSIS — L570 Actinic keratosis: Secondary | ICD-10-CM | POA: Diagnosis not present

## 2022-10-23 DIAGNOSIS — C029 Malignant neoplasm of tongue, unspecified: Secondary | ICD-10-CM | POA: Diagnosis not present

## 2022-10-23 DIAGNOSIS — K9189 Other postprocedural complications and disorders of digestive system: Secondary | ICD-10-CM | POA: Diagnosis not present

## 2022-11-06 DIAGNOSIS — C44319 Basal cell carcinoma of skin of other parts of face: Secondary | ICD-10-CM | POA: Diagnosis not present

## 2022-11-19 ENCOUNTER — Other Ambulatory Visit: Payer: Self-pay | Admitting: Family Medicine

## 2022-11-20 DIAGNOSIS — Z85828 Personal history of other malignant neoplasm of skin: Secondary | ICD-10-CM | POA: Diagnosis not present

## 2022-11-20 DIAGNOSIS — L570 Actinic keratosis: Secondary | ICD-10-CM | POA: Diagnosis not present

## 2022-11-20 DIAGNOSIS — L821 Other seborrheic keratosis: Secondary | ICD-10-CM | POA: Diagnosis not present

## 2022-11-20 DIAGNOSIS — D045 Carcinoma in situ of skin of trunk: Secondary | ICD-10-CM | POA: Diagnosis not present

## 2022-12-03 ENCOUNTER — Encounter: Payer: Self-pay | Admitting: Internal Medicine

## 2022-12-03 ENCOUNTER — Ambulatory Visit (INDEPENDENT_AMBULATORY_CARE_PROVIDER_SITE_OTHER): Payer: Medicare Other | Admitting: Internal Medicine

## 2022-12-03 VITALS — BP 131/63 | HR 100 | Temp 97.9°F | Ht 68.0 in | Wt 167.6 lb

## 2022-12-03 DIAGNOSIS — B369 Superficial mycosis, unspecified: Secondary | ICD-10-CM

## 2022-12-03 DIAGNOSIS — K047 Periapical abscess without sinus: Secondary | ICD-10-CM

## 2022-12-03 MED ORDER — AMOXICILLIN-POT CLAVULANATE 875-125 MG PO TABS
1.0000 | ORAL_TABLET | Freq: Two times a day (BID) | ORAL | 0 refills | Status: AC
Start: 2022-12-03 — End: ?

## 2022-12-03 MED ORDER — CICLOPIROX OLAMINE 0.77 % EX CREA
TOPICAL_CREAM | Freq: Two times a day (BID) | CUTANEOUS | 3 refills | Status: AC
Start: 2022-12-03 — End: ?

## 2022-12-03 NOTE — Progress Notes (Signed)
[facility]: [phone]   -- Medical Office Visit --  Patient:  Stacy Wagner      Age: 82 y.o.       Sex:  female  Date:   12/03/2022 Patient Care Team: Stacy Covey, MD as PCP - General (Family Medicine) Today's Healthcare Provider: Lula Olszewski, MD      Assessment & Plan Dermal mycosis She exhibits red, swollen legs with a rash-like appearance, indicative of a suspected fungal infection (dermal mycosis) due to dry skin and the condition's chronic nature. We will prescribe Cyclopirox antifungal ointment, to be applied twice daily on the feet and legs. Should there be no improvement with topical treatment, we will consider oral antifungal medication. Dental abscess She is experiencing recurrent pain in the tongue following surgery for cancer, with a possible dental infection due to decayed teeth. We will prescribe Augmentin to address the potential dental infection and arrange a follow-up with Dr. Caryl Wagner for further evaluation.  This is quite painful but she declined / deferred / expressed a preference to not move forward with any pain medication(s) which I offered.  General Health Maintenance   We will continue her current medications, excluding Hydrocodone/Acetaminophen, and check in with Dr. Caryl Wagner next week to assess the effectiveness of the treatment plan.       Diagnoses and all orders for this visit: Dermal mycosis -     ciclopirox (LOPROX) 0.77 % cream; Apply topically 2 (two) times daily. Dental abscess -     amoxicillin-clavulanate (AUGMENTIN) 875-125 MG tablet; Take 1 tablet by mouth 2 (two) times daily.  Recommended follow-up: 1 week Dr. Caryl Wagner, 2 days Dr. Pollyann Wagner ENT Shared to both        Subjective   82 y.o. female who has Hypothyroidism; VITAMIN D DEFICIENCY; Hyperlipidemia; SYNDROME, CARPAL TUNNEL; LABYRINTHITIS, CHRONIC; Essential hypertension; OSTEOARTHRITIS; DEGENERATIVE JOINT DISEASE, CERVICAL SPINE; NECK PAIN;  INSOMNIA; EDEMA; WEIGHT GAIN; HEADACHE; FREQUENCY, URINARY; UNS ADVRS EFF OTH RX MEDICINAL&BIOLOGICAL SBSTNC; Obesity (BMI 30-39.9); GERD (gastroesophageal reflux disease); Dizziness and giddiness; Nausea without vomiting; Postmenopausal; Bursitis of left shoulder; Cancer of lateral margin of anterior two-thirds of tongue (HCC); Colon adenomas; Idiopathic scoliosis of thoracolumbar region; Scoliosis of lumbosacral spine; Dysesthesia RLE; Constipation due to neurogenic bowel; Leucocytosis; Acute blood loss anemia; Insomnia; and Osteoarthritis of left knee on their problem list. Her reasons/main concerns/chief complaints for today's office visit are Red and swollen legs   ------------------------------------------------------------------------------------------------------------------------ AI-Extracted: Discussed the use of AI scribe software for clinical note transcription with the patient, who gave verbal consent to proceed.  History of Present Illness   The patient, Stacy Wagner, presents with a chief complaint of red and swollen legs. The condition has been ongoing for a couple of weeks, with the left leg being more affected. The patient describes the appearance as a rash, but notes that it does not itch. The redness and swelling seem to improve as the day progresses. The patient also reports a history of dry, cracked heels, which she manages with Neosporin and bandages.  In addition to the leg issues, the patient has been experiencing pain in her tongue, which has been operated on three times due to cancer. The pain had been improving post-surgery, but has recently worsened, causing discomfort when eating solid foods.  The patient's medication regimen includes doxycycline and lidocaine solution for her throat. She denies taking hydrocodone and acetaminophen. The patient also takes medication for depression, which she attributes to the stress of caring for her husband who has  Alzheimer's disease.  The  patient denies any recent falls, changes in medication, or issues with her liver. She also denies any alcohol consumption. The patient has a history of back surgery and currently has a rod in her back, which limits her ability to reach her feet.      She has a past medical history of AC (acromioclavicular) joint bone spurs, Anxiety, Arthritis, Back pain, Bursitis disorder, Cataracts, bilateral, Chronic lower back pain, Complication of anesthesia, Family history of adverse reaction to anesthesia, GERD (gastroesophageal reflux disease), Hearing loss, Hypertension, Hypothyroidism, Idiopathic scoliosis, Leg pain, Lumbar radiculopathy, Lumbar stenosis with neurogenic claudication, Migraine, Mouth sores, Neck pain, OA (osteoarthritis), PONV (postoperative nausea and vomiting), Prolapse of female bladder, acquired, Sagittal plane imbalance, Skin cancer (11/2017), Thyroid disease, and Tongue cancer (HCC).  Problem list overviews that were updated at today's visit: No problems updated. Current Outpatient Medications on File Prior to Visit  Medication Sig   acetaminophen (TYLENOL) 325 MG tablet Take 1-2 tablets (325-650 mg total) by mouth every 4 (four) hours as needed for mild pain.   amLODipine (NORVASC) 5 MG tablet Take 1 tablet (5 mg total) by mouth daily.   cyanocobalamin (VITAMIN B12) 1000 MCG tablet Take 1,000 mcg by mouth daily.   diphenhydrAMINE (BENADRYL) 25 MG tablet Take 50 mg by mouth at bedtime.   estradiol (ESTRACE) 0.5 MG tablet Take 1 tablet (0.5 mg total) by mouth daily.   ibuprofen (ADVIL) 200 MG tablet Take 400 mg by mouth at bedtime.   levothyroxine (SYNTHROID) 75 MCG tablet Take 1 tablet (75 mcg total) by mouth every morning.   lidocaine (XYLOCAINE) 2 % solution SMARTSIG:5 Milliliter(s) By Mouth PRN   Lysine 1000 MG TABS Take 500 mg by mouth daily.   Magnesium 400 MG CAPS Take 400 mg by mouth at bedtime.   Multiple Vitamins-Minerals (MULTIVITAMIN WITH MINERALS) tablet Take 1 tablet by  mouth daily.   ondansetron (ZOFRAN-ODT) 4 MG disintegrating tablet Take 1 tablet (4 mg total) by mouth every 8 (eight) hours as needed for nausea or vomiting.   sertraline (ZOLOFT) 50 MG tablet TAKE 1/2 TABLET BY MOUTH EVERY DAY FOR 7 DAYS, THEN INCREASE TO 1 TABLET DAILY   triamcinolone cream (KENALOG) 0.1 % Apply 1 application topically 2 (two) times daily as needed (irritation).   triamterene-hydrochlorothiazide (MAXZIDE) 75-50 MG tablet Take 1 tablet by mouth daily with breakfast.   HYDROcodone-acetaminophen (HYCET) 7.5-325 mg/15 ml solution Take 15 mLs by mouth every 6 (six) hours as needed for moderate pain. (Patient not taking: Reported on 12/03/2022)   HYDROcodone-acetaminophen (NORCO) 7.5-325 MG tablet Take 1 tablet by mouth every 6 (six) hours as needed for moderate pain. (Patient not taking: Reported on 12/03/2022)   No current facility-administered medications on file prior to visit.   Medications Discontinued During This Encounter  Medication Reason   clindamycin (CLEOCIN) 300 MG capsule Completed Course   doxycycline (VIBRAMYCIN) 100 MG capsule Completed Course     Objective   Physical Exam  BP 131/63 (BP Location: Right Arm, Patient Position: Sitting)   Pulse 100   Temp 97.9 F (36.6 C) (Temporal)   Ht 5\' 8"  (1.727 m)   Wt 167 lb 9.6 oz (76 kg)   SpO2 98%   BMI 25.48 kg/m  Wt Readings from Last 10 Encounters:  12/03/22 167 lb 9.6 oz (76 kg)  08/28/22 179 lb (81.2 kg)  05/02/22 190 lb 3.2 oz (86.3 kg)  04/03/22 184 lb 12.8 oz (83.8 kg)  01/30/21 173 lb 1.6  oz (78.5 kg)  08/30/20 160 lb 6.4 oz (72.8 kg)  06/14/20 150 lb (68 kg)  01/01/20 150 lb 14.4 oz (68.4 kg)  12/07/19 158 lb 12.8 oz (72 kg)  08/13/19 158 lb 11.7 oz (72 kg)   Vital signs reviewed.  Nursing notes reviewed. Weight trend reviewed. Abnormalities and Problem-Specific physical exam findings:  Physical Exam   VITALS: BP- 131/63 SKIN: Erythema and edema on legs, dry, cracked skin on heels suggestive of  dermal mycosis.       General Appearance:  No acute distress appreciable.   Well-groomed, healthy-appearing female.  Well proportioned with no abnormal fat distribution.  Good muscle tone. Pulmonary:  Normal work of breathing at rest, no respiratory distress apparent. SpO2: 98 %  Musculoskeletal: All extremities are intact.  Neurological:  Awake, alert, oriented, and engaged.  No obvious focal neurological deficits or cognitive impairments.  Sensorium seems unclouded.   Speech is clear and coherent with logical content. Psychiatric:  Appropriate mood, pleasant and cooperative demeanor, thoughtful and engaged during the exam Photographs Taken 12/03/2022 :     Results            No results found for any visits on 12/03/22.  Admission on 08/28/2022, Discharged on 08/28/2022  Component Date Value   Sodium 08/28/2022 137    Potassium 08/28/2022 3.5    Chloride 08/28/2022 101    CO2 08/28/2022 22    Glucose, Bld 08/28/2022 91    BUN 08/28/2022 20    Creatinine, Ser 08/28/2022 1.07 (H)    Calcium 08/28/2022 8.8 (L)    GFR, Estimated 08/28/2022 53 (L)    Anion gap 08/28/2022 14    WBC 08/28/2022 10.3    RBC 08/28/2022 4.12    Hemoglobin 08/28/2022 12.3    HCT 08/28/2022 37.7    MCV 08/28/2022 91.5    MCH 08/28/2022 29.9    MCHC 08/28/2022 32.6    RDW 08/28/2022 13.5    Platelets 08/28/2022 290    nRBC 08/28/2022 0.0    SURGICAL PATHOLOGY 08/28/2022                     Value:SURGICAL PATHOLOGY CASE: [id number] PATIENT: Idelle Leech Surgical Pathology Report     Clinical History: tongue cancer (cm)     FINAL MICROSCOPIC DIAGNOSIS:  A. TONGUE, RIGHT, PARTIAL GLOSSECTOMY: - Squamous epithelium with focal high-grade dysplasia - Fibrosis and inflammation consistent with previous procedure related changes - Negative for invasive carcinoma - All margins negative for dysplasia, invasive carcinoma (see comment)  COMMENT:  This case was reviewed with Dr. Luisa Hart  who agrees with the above interpretation.     INTRAOPERATIVE CONSULTATION:  A.  Right partial glossectomy, stitch marks double long anterior, single long superior, single short inferior, double short posterior: "Frozen margins negative for invasive carcinoma." Intraoperative diagnosis rendered by Dr. Luisa Hart at 11:40 AM on 08/28/2022.  GROSS DESCRIPTION:  Specimen: Received fresh is "right partial glossectomy" Inking scheme: Red = superior, blue = inferior, green                          = anterior, black = posterior, and yellow = deep. Size: 4.5 cm from anterior to posterior by 2.7 cm from superior to inferior by 1.2 cm from superficial to deep Lesion: There is a white-tan, firm plaque-like area on the superficial surface measuring 1.5 x 1.4 cm.  Sectioning reveals no deep invasion, grossly. Block Summary: A1 frozen section of radial  to anterior margin A2 frozen section of radial to posterior margin A3 frozen section of superior/deep margin A4 frozen section of inferior/deep margin A5 additional anterior margin A6-A7 cross-sections to superior, inferior, and deep A8 additional posterior margin Renette Butters, 08/29/2022)   Final Diagnosis performed by Clifton James, MD.   Electronically signed 09/05/2022 Technical component performed at [employer]. [facility], [address] [place], [place] [address].  Professional component performed at [facility], [address], [place], [place] [address].  Immunohistochemistry Technical compo                         nent (if applicable) was performed at [facility]. [address], [address], [place], [place] [address].   IMMUNOHISTOCHEMISTRY DISCLAIMER (if applicable): Some of these immunohistochemical stains may have been developed and the performance characteristics determine by [facility]. Some may not have been cleared or approved by the U.S. Food and Drug Administration. The FDA  has determined that such clearance or approval is not necessary. This test is used for clinical purposes. It should not be regarded as investigational or for research. This laboratory is certified under the Clinical Laboratory Improvement Amendments of 1988 (CLIA-88) as qualified to perform high complexity clinical laboratory testing.  The controls stained appropriately.   IHC stains are performed on formalin fixed, paraffin embedded tissue using a 3,3"diaminobenzidine (DAB) chromogen and Leica Bond Autostainer System. The staining intensity of the nucleus is score manuall                         y and is reported as the percentage of tumor cell nuclei demonstrating specific nuclear staining. The specimens are fixed in 10% Neutral Formalin for at least 6 hours and up to 72hrs. These tests are validated on decalcified tissue. Results should be interpreted with caution given the possibility of false negative results on decalcified specimens. Antibody Clones are as follows ER-clone 38F, PR-clone 16, Ki67- clone MM1. Some of these immunohistochemical stains may have been developed and the performance characteristics determined by [facility].   Office Visit on 04/03/2022  Component Date Value   TSH 04/03/2022 3.95    Sodium 04/03/2022 139    Potassium 04/03/2022 3.7    Chloride 04/03/2022 100    CO2 04/03/2022 24    Glucose, Bld 04/03/2022 76    BUN 04/03/2022 21    Creatinine, Ser 04/03/2022 0.86    GFR 04/03/2022 64.24    Calcium 04/03/2022 10.4   Scanned Document on 03/08/2022  Component Date Value   HM Dexa Scan 03/07/2022 Osteoporosis   Abstract on 01/23/2022  Component Date Value   HM Mammogram 01/22/2022 0-4 [facility]    No image results found.   No results found.  No results found.     Additional Info: This encounter employed real-time, collaborative documentation. The patient actively reviewed and updated their medical record on a shared screen, ensuring  transparency and facilitating joint problem-solving for the problem list, overview, and plan. This approach promotes accurate, informed care. The treatment plan was discussed and reviewed in detail, including medication safety, potential side effects, and all patient questions. We confirmed understanding and comfort with the plan. Follow-up instructions were established, including contacting the office for any concerns, returning if symptoms worsen, persist, or new symptoms develop, and precautions for potential emergency department visits.

## 2022-12-03 NOTE — Patient Instructions (Signed)
VISIT SUMMARY:  Dear [name], during your visit, we discussed your concerns about the redness and swelling in your legs, the pain in your tongue, and your ongoing management of depression. We suspect that the rash on your legs may be due to a fungal infection, and the pain in your tongue/back of throat/neck could be due to a dental infection.   YOUR PLAN:  -LOWER EXTREMITY RASH: We believe the redness and swelling in your legs might be due to a fungal infection. We will treat this with an antifungal ointment called Cyclopirox, which you should apply twice daily on your feet and legs. If there's no improvement, we may consider an oral antifungal medication.  We had offered to try it but you wanted to wait and check what the ointment can do.  We are not certain on the rash on the legs being fungal but your feet certainly seem to have a fungal infection that looks like it may be spreading.  -ORAL PAIN: The pain in your tongue, which has been operated on due to cancer, might be due to a dental infection. We will prescribe a medication called Augmentin to treat this potential infection and arrange a follow-up with Dr. Caryl Never for further evaluation.  You also have visit wit Dr. Pollyann Kennedy in 2 days to be evaluated by ENT- we couldn't see any abnormality except for dental disease with our examination tools available today.  INSTRUCTIONS:  Please apply the Cyclopirox ointment twice daily on your feet and legs. Start taking the Augmentin as prescribed for your potential dental infection. Continue your current medication for depression. Please check in with Dr. Caryl Never next week to assess the effectiveness of the treatment plan.

## 2022-12-06 DIAGNOSIS — C029 Malignant neoplasm of tongue, unspecified: Secondary | ICD-10-CM | POA: Diagnosis not present

## 2022-12-17 DIAGNOSIS — C029 Malignant neoplasm of tongue, unspecified: Secondary | ICD-10-CM | POA: Diagnosis not present

## 2023-01-04 DIAGNOSIS — C029 Malignant neoplasm of tongue, unspecified: Secondary | ICD-10-CM | POA: Diagnosis not present

## 2023-01-21 DIAGNOSIS — H04123 Dry eye syndrome of bilateral lacrimal glands: Secondary | ICD-10-CM | POA: Diagnosis not present

## 2023-01-21 DIAGNOSIS — H40013 Open angle with borderline findings, low risk, bilateral: Secondary | ICD-10-CM | POA: Diagnosis not present

## 2023-01-21 DIAGNOSIS — H26493 Other secondary cataract, bilateral: Secondary | ICD-10-CM | POA: Diagnosis not present

## 2023-01-21 DIAGNOSIS — H35363 Drusen (degenerative) of macula, bilateral: Secondary | ICD-10-CM | POA: Diagnosis not present

## 2023-01-29 DIAGNOSIS — D1801 Hemangioma of skin and subcutaneous tissue: Secondary | ICD-10-CM | POA: Diagnosis not present

## 2023-01-29 DIAGNOSIS — L814 Other melanin hyperpigmentation: Secondary | ICD-10-CM | POA: Diagnosis not present

## 2023-01-29 DIAGNOSIS — L821 Other seborrheic keratosis: Secondary | ICD-10-CM | POA: Diagnosis not present

## 2023-01-29 DIAGNOSIS — L570 Actinic keratosis: Secondary | ICD-10-CM | POA: Diagnosis not present

## 2023-01-29 DIAGNOSIS — Z85828 Personal history of other malignant neoplasm of skin: Secondary | ICD-10-CM | POA: Diagnosis not present

## 2023-01-29 DIAGNOSIS — Z1231 Encounter for screening mammogram for malignant neoplasm of breast: Secondary | ICD-10-CM | POA: Diagnosis not present

## 2023-01-29 LAB — HM MAMMOGRAPHY

## 2023-02-26 ENCOUNTER — Other Ambulatory Visit: Payer: Self-pay | Admitting: Family Medicine

## 2023-03-28 ENCOUNTER — Other Ambulatory Visit: Payer: Self-pay | Admitting: Family Medicine

## 2023-06-18 ENCOUNTER — Other Ambulatory Visit: Payer: Self-pay | Admitting: Family Medicine

## 2023-08-13 ENCOUNTER — Other Ambulatory Visit: Payer: Self-pay | Admitting: Family Medicine

## 2023-09-09 ENCOUNTER — Ambulatory Visit: Admitting: Family Medicine

## 2023-09-09 ENCOUNTER — Encounter: Payer: Self-pay | Admitting: Family Medicine

## 2023-09-09 ENCOUNTER — Ambulatory Visit: Payer: Self-pay | Admitting: Family Medicine

## 2023-09-09 VITALS — BP 134/66 | HR 83 | Temp 98.0°F | Wt 177.0 lb

## 2023-09-09 DIAGNOSIS — I10 Essential (primary) hypertension: Secondary | ICD-10-CM | POA: Diagnosis not present

## 2023-09-09 DIAGNOSIS — R5383 Other fatigue: Secondary | ICD-10-CM

## 2023-09-09 DIAGNOSIS — E785 Hyperlipidemia, unspecified: Secondary | ICD-10-CM | POA: Diagnosis not present

## 2023-09-09 DIAGNOSIS — Z833 Family history of diabetes mellitus: Secondary | ICD-10-CM

## 2023-09-09 DIAGNOSIS — E039 Hypothyroidism, unspecified: Secondary | ICD-10-CM | POA: Diagnosis not present

## 2023-09-09 LAB — CBC WITH DIFFERENTIAL/PLATELET
Basophils Absolute: 0.1 K/uL (ref 0.0–0.1)
Basophils Relative: 0.8 % (ref 0.0–3.0)
Eosinophils Absolute: 1.1 K/uL — ABNORMAL HIGH (ref 0.0–0.7)
Eosinophils Relative: 12.4 % — ABNORMAL HIGH (ref 0.0–5.0)
HCT: 39.7 % (ref 36.0–46.0)
Hemoglobin: 13.3 g/dL (ref 12.0–15.0)
Lymphocytes Relative: 16 % (ref 12.0–46.0)
Lymphs Abs: 1.4 K/uL (ref 0.7–4.0)
MCHC: 33.4 g/dL (ref 30.0–36.0)
MCV: 92.3 fl (ref 78.0–100.0)
Monocytes Absolute: 0.9 K/uL (ref 0.1–1.0)
Monocytes Relative: 10.2 % (ref 3.0–12.0)
Neutro Abs: 5.2 K/uL (ref 1.4–7.7)
Neutrophils Relative %: 60.6 % (ref 43.0–77.0)
Platelets: 298 K/uL (ref 150.0–400.0)
RBC: 4.3 Mil/uL (ref 3.87–5.11)
RDW: 13.6 % (ref 11.5–15.5)
WBC: 8.6 K/uL (ref 4.0–10.5)

## 2023-09-09 LAB — COMPREHENSIVE METABOLIC PANEL WITH GFR
ALT: 17 U/L (ref 0–35)
AST: 24 U/L (ref 0–37)
Albumin: 4.3 g/dL (ref 3.5–5.2)
Alkaline Phosphatase: 95 U/L (ref 39–117)
BUN: 26 mg/dL — ABNORMAL HIGH (ref 6–23)
CO2: 29 meq/L (ref 19–32)
Calcium: 9.8 mg/dL (ref 8.4–10.5)
Chloride: 101 meq/L (ref 96–112)
Creatinine, Ser: 0.96 mg/dL (ref 0.40–1.20)
GFR: 55.73 mL/min — ABNORMAL LOW (ref >60.00)
Glucose, Bld: 88 mg/dL (ref 70–99)
Potassium: 4.4 meq/L (ref 3.5–5.1)
Sodium: 138 meq/L (ref 135–145)
Total Bilirubin: 0.5 mg/dL (ref 0.2–1.2)
Total Protein: 7.8 g/dL (ref 6.0–8.3)

## 2023-09-09 LAB — HEMOGLOBIN A1C: Hgb A1c MFr Bld: 6 % (ref 4.6–6.5)

## 2023-09-09 LAB — TSH: TSH: 3.64 u[IU]/mL (ref 0.35–5.50)

## 2023-09-09 MED ORDER — SERTRALINE HCL 50 MG PO TABS: ORAL_TABLET | ORAL | 3 refills | Status: AC

## 2023-09-09 MED ORDER — TRIAMTERENE-HCTZ 75-50 MG PO TABS: 1.0000 | ORAL_TABLET | Freq: Every day | ORAL | 3 refills | Status: AC

## 2023-09-09 MED ORDER — ESTRADIOL 0.5 MG PO TABS: 0.5000 mg | ORAL_TABLET | Freq: Every day | ORAL | 3 refills | Status: AC

## 2023-09-09 MED ORDER — LEVOTHYROXINE SODIUM 75 MCG PO TABS: 75.0000 ug | ORAL_TABLET | Freq: Every day | ORAL | 3 refills | Status: AC

## 2023-09-09 NOTE — Progress Notes (Signed)
 Established Patient Office Visit  Subjective   Patient ID: Stacy Wagner, female    DOB: [birth year]  Age: 83 y.o. MRN: [id number]  No chief complaint on file.   HPI   Stacy Wagner is seen for medical follow-up.  She just lost her husband 3 months ago and this has been a very difficult time for her.  He did have dementia.  They just gone to bed and soon after laying down he complained of severe pain in his upper back and then became unconscious.  EMS was called out and they did regain a heartbeat but eventually lost him.  No autopsy done.  Question was acute MI versus other possibilities such as thoracic aneurysm rupture.  Patient does have very supportive church family.  Chronic problems include hypothyroidism, hypertension, history of cancer of the tongue, osteoarthritis.  Medications reviewed.  She is compliant with all.  Needs refills of all of her medications today.  Overdue for labs.  Past Medical History:  Diagnosis Date   AC (acromioclavicular) joint bone spurs    Anxiety    Arthritis    lower back (01/15/2018)   Back pain    Bursitis disorder    bil shoulders   Cataracts, bilateral    Chronic lower back pain    Complication of anesthesia    some nausea in past with anesthesia, pt. also recalls having the tube removed too early & her her throat was full of mucous, states that she could move any part of her body)   Family history of adverse reaction to anesthesia    daughter gets PONV (01/15/2018)   GERD (gastroesophageal reflux disease)    Hearing loss    bil / no hearing aids   Hypertension    Hypothyroidism    Idiopathic scoliosis    Leg pain    Lumbar radiculopathy    Lumbar stenosis with neurogenic claudication    Migraine    used to have them all the time; none in 10 yrs (01/15/2018)   Mouth sores    Neck pain    OA (osteoarthritis)    Multiple injections in lumbar region    PONV (postoperative nausea and vomiting)    Prolapse of female bladder,  acquired    Sagittal plane imbalance    Skin cancer 11/2017   left thigh   Thyroid  disease    Tongue cancer (HCC)    12-2017   Past Surgical History:  Procedure Laterality Date   ABDOMINAL EXPOSURE N/A 08/04/2019   Procedure: ABDOMINAL EXPOSURE;  Surgeon: Eliza Lonni RAMAN, MD;  Location: [facility] OR;  Service: Vascular;  Laterality: N/A;   ABDOMINAL HYSTERECTOMY     uterine prolapse   ABDOMINOPLASTY     tummy tuck librado excess skin arms and legs   ANTERIOR LATERAL LUMBAR FUSION 4 LEVELS Right 08/04/2019   Procedure: Lumbar One-Two, Lumbar Two-Three, Lumbar Three-Four, Lumbar Four-Five  Anterolateral Lumbar Interbody Fusion;  Surgeon: Unice Pac, MD;  [facility];  Service: Neurosurgery;  Laterality: Right;   ANTERIOR LUMBAR FUSION N/A 08/04/2019   Procedure: Lumbar Five - Sacral One  Anterior Lumbar Interbody Fusion;  Surgeon: Unice Pac, MD;  Location: [facility] OR;  Service: Neurosurgery;  Laterality: N/A;  Dr Lonni Eliza to assist   APPENDECTOMY     APPLICATION OF INTRAOPERATIVE CT SCAN N/A 08/07/2019   Procedure: APPLICATION OF INTRAOPERATIVE CT SCAN;  Surgeon: Unice Pac, MD;  Location: [facility] OR;  Service: Neurosurgery;  Laterality: N/A;   BACK SURGERY  BREAST SURGERY Right 1980   removal of a nodule- post trauma, benign pathology   CARPAL TUNNEL RELEASE Right    CATARACT EXTRACTION, BILATERAL     COLONOSCOPY     GLOSSECTOMY N/A 01/15/2018   Procedure: PARTIAL GLOSSECTOMY;  Surgeon: Ethyl Lonni BRAVO, MD;  Location: [facility] OR;  Service: ENT;  Laterality: N/A;   LUMBAR SPINE SURGERY  1988   /w Dr. Leeann- reports that it was in the lumbar region, unsure of date.   MINOR EXCISION OF ORAL LESION N/A 06/21/2020   Procedure: EXCISION OF TONGUE LESION WITH CLOSURE;  Surgeon: Ethyl Lonni BRAVO, MD;  Location: [facility];  Service: ENT;  Laterality: N/A;   NASAL POLYP EXCISION     PARTIAL GLOSSECTOMY  01/15/2018   POSTERIOR LUMBAR FUSION 4 LEVEL N/A  08/07/2019   Procedure: Thoracic Ten to pelvis fixation;  Surgeon: Unice Pac, MD;  Location: [facility] OR;  Service: Neurosurgery;  Laterality: N/A;  Thoracic Ten to pelvis fixation   SKIN CANCER EXCISION Left 11/2017   thigh   TONGUE SURGERY     TONSILLECTOMY     TUMMY TUCK      reports that she has never smoked. She has never been exposed to tobacco smoke. She has never used smokeless tobacco. She reports that she does not currently use alcohol. She reports that she does not use drugs. family history includes Cancer in her mother; Diabetes in her father; Hypertension in her sister; Stomach cancer in her cousin. Allergies  Allergen Reactions   Codeine Nausea Only    Review of Systems  Constitutional:  Negative for chills, fever, malaise/fatigue and weight loss.  Eyes:  Negative for blurred vision.  Respiratory:  Negative for shortness of breath.   Cardiovascular:  Negative for chest pain.  Neurological:  Negative for dizziness, weakness and headaches.      Objective:     BP 134/66 (BP Location: Left Arm, Patient Position: Sitting, Cuff Size: Normal)   Pulse 83   Temp 98 F (36.7 C) (Oral)   Wt 177 lb (80.3 kg)   SpO2 95%   BMI 26.91 kg/m  BP Readings from Last 3 Encounters:  09/09/23 134/66  12/03/22 131/63  08/28/22 125/65   Wt Readings from Last 3 Encounters:  09/09/23 177 lb (80.3 kg)  12/03/22 167 lb 9.6 oz (76 kg)  08/28/22 179 lb (81.2 kg)      Physical Exam Vitals reviewed.  Constitutional:      General: She is not in acute distress.    Appearance: She is well-developed. She is not ill-appearing.  Eyes:     Pupils: Pupils are equal, round, and reactive to light.  Neck:     Thyroid : No thyromegaly.     Vascular: No JVD.  Cardiovascular:     Rate and Rhythm: Normal rate and regular rhythm.     Heart sounds:     No gallop.  Pulmonary:     Effort: Pulmonary effort is normal. No respiratory distress.     Breath sounds: Normal breath sounds. No wheezing  or rales.  Musculoskeletal:     Cervical back: Neck supple.     Right lower leg: No edema.     Left lower leg: No edema.  Lymphadenopathy:     Cervical: No cervical adenopathy.  Neurological:     Mental Status: She is alert.      No results found for any visits on 09/09/23.    The ASCVD Risk score (Arnett DK, et  al., 2019) failed to calculate for the following reasons:   The 2019 ASCVD risk score is only valid for ages 62 to 55    Assessment & Plan:   Problem List Items Addressed This Visit       Unprioritized   Essential hypertension   Relevant Medications   triamterene -hydrochlorothiazide  (MAXZIDE ) 75-50 MG tablet   Other Relevant Orders   CMP   Hyperlipidemia   Relevant Medications   triamterene -hydrochlorothiazide  (MAXZIDE ) 75-50 MG tablet   Hypothyroidism - Primary   Relevant Medications   levothyroxine  (SYNTHROID ) 75 MCG tablet   Other Relevant Orders   TSH   Other Visit Diagnoses       Fatigue, unspecified type       Relevant Orders   CBC with Differential/Platelet     Family history of diabetes mellitus       Relevant Orders   Hemoglobin A1c     Patient presents today for medical follow-up.  Multiple chronic problems as above.  Overdue for labs. - Refilled all of her regular medications for 1 year - Check labs as above - She did have 1 recent fall.  We discussed life alert systems and she will consider  Return in about 6 months (around 03/11/2024).    Wolm Scarlet, MD

## 2023-09-23 DIAGNOSIS — D485 Neoplasm of uncertain behavior of skin: Secondary | ICD-10-CM | POA: Diagnosis not present

## 2023-09-23 DIAGNOSIS — Z85828 Personal history of other malignant neoplasm of skin: Secondary | ICD-10-CM | POA: Diagnosis not present

## 2023-09-23 DIAGNOSIS — C44722 Squamous cell carcinoma of skin of right lower limb, including hip: Secondary | ICD-10-CM | POA: Diagnosis not present

## 2023-09-23 DIAGNOSIS — L989 Disorder of the skin and subcutaneous tissue, unspecified: Secondary | ICD-10-CM | POA: Diagnosis not present

## 2023-09-23 DIAGNOSIS — L821 Other seborrheic keratosis: Secondary | ICD-10-CM | POA: Diagnosis not present

## 2023-09-23 DIAGNOSIS — L820 Inflamed seborrheic keratosis: Secondary | ICD-10-CM | POA: Diagnosis not present

## 2023-09-23 DIAGNOSIS — L570 Actinic keratosis: Secondary | ICD-10-CM | POA: Diagnosis not present

## 2023-09-23 DIAGNOSIS — D0472 Carcinoma in situ of skin of left lower limb, including hip: Secondary | ICD-10-CM | POA: Diagnosis not present

## 2023-09-23 DIAGNOSIS — D1801 Hemangioma of skin and subcutaneous tissue: Secondary | ICD-10-CM | POA: Diagnosis not present

## 2023-10-23 DIAGNOSIS — D485 Neoplasm of uncertain behavior of skin: Secondary | ICD-10-CM | POA: Diagnosis not present

## 2023-10-23 DIAGNOSIS — L988 Other specified disorders of the skin and subcutaneous tissue: Secondary | ICD-10-CM | POA: Diagnosis not present

## 2023-11-04 DIAGNOSIS — M25562 Pain in left knee: Secondary | ICD-10-CM | POA: Diagnosis not present

## 2023-11-18 ENCOUNTER — Ambulatory Visit: Payer: Self-pay

## 2023-11-18 NOTE — Telephone Encounter (Signed)
 FYI Only or Action Required?: Action required by provider: request for appointment.  Patient was last seen in primary care on 09/09/2023 by Micheal Wolm ORN, MD.  Called Nurse Triage reporting Urinary Frequency, Dizziness, and Otalgia.  Symptoms began a week ago.  Interventions attempted: Rest, hydration, or home remedies.  Symptoms are: gradually worsening.  Triage Disposition: See Physician Within 24 Hours  Patient/caregiver understands and will follow disposition?: No, refuses disposition   Copied from CRM [id number]. Topic: Clinical - Red Word Triage >> Nov 18, 2023 12:54 PM Viola F wrote: Patient has been having dizziness, loss of balance and strong odor in urine - she thinks she a kidney or gall bladder infection Reason for Disposition  [1] NO dizziness now AND [2] one or more stroke risk factors (i.e., hypertension, diabetes, prior stroke/TIA/heart attack)  Bad or foul-smelling urine  Answer Assessment - Initial Assessment Questions Additional info: Patient insisting on PCP appointment to evaluated vertigo, urinary odor, next available with pcp is not until October 1, patient states she would like to see him then, this writer explained with her symptoms she needs evaluation before next available with pcp. Patient became upset this Clinical research associate was advising other provider or urgent care for evaluation and states I wish I didn't call. Advised message being sent to pcp to see if ok to schedule his next available on Oct 1 or if she could be worked in sooner. Patient asks for return call to her call number [phone]    1. DESCRIPTION: Describe your dizziness.     Head swimming 2. VERTIGO: Do you feel like either you or the room is spinning or tilting?      spinning 3. LIGHTHEADED: Do you feel lightheaded? (e.g., somewhat faint, woozy, weak upon standing)     Swimming feeling  4. SEVERITY: How bad is it?  Can you walk?     mild 5. ONSET:  When did the dizziness  begin?     2 days 6. AGGRAVATING FACTORS: Does anything make it worse? (e.g., standing, change in head position)     Changing position 7. CAUSE: What do you think is causing the dizziness?     vertigo 8. RECURRENT SYMPTOM: Have you had dizziness before? If Yes, ask: When was the last time? What happened that time?     yes 9. OTHER SYMPTOMS: Do you have any other symptoms? (e.g., earache, headache, numbness, tinnitus, vomiting, weakness)     Left ear pain  Answer Assessment - Initial Assessment Questions 1. SYMPTOM: What's the main symptom you're concerned about? (e.g., frequency, incontinence)     Strong odor  2. ONSET: When did the  odor  start?     Several days 3. PAIN: Is there any pain? If Yes, ask: How bad is it? (Scale: 1-10; mild, moderate, severe)     Denies  4. CAUSE: What do you think is causing the symptoms?     uti 5. OTHER SYMPTOMS: Do you have any other symptoms? (e.g., blood in urine, fever, flank pain, pain with urination)     Denies  6. PREGNANCY: Is there any chance you are pregnant? When was your last menstrual period?  Protocols used: Urinary Symptoms-A-AH, Dizziness - Vertigo-A-AH

## 2023-11-19 ENCOUNTER — Encounter: Payer: Self-pay | Admitting: Internal Medicine

## 2023-11-19 ENCOUNTER — Ambulatory Visit (INDEPENDENT_AMBULATORY_CARE_PROVIDER_SITE_OTHER): Admitting: Internal Medicine

## 2023-11-19 VITALS — BP 120/70 | HR 76 | Temp 97.8°F | Wt 180.5 lb

## 2023-11-19 DIAGNOSIS — R0981 Nasal congestion: Secondary | ICD-10-CM

## 2023-11-19 DIAGNOSIS — R300 Dysuria: Secondary | ICD-10-CM | POA: Diagnosis not present

## 2023-11-19 LAB — POC URINALSYSI DIPSTICK (AUTOMATED)
Bilirubin, UA: NEGATIVE
Blood, UA: NEGATIVE
Glucose, UA: NEGATIVE
Ketones, UA: NEGATIVE
Leukocytes, UA: NEGATIVE
Nitrite, UA: NEGATIVE
Protein, UA: NEGATIVE
Spec Grav, UA: 1.01 (ref 1.010–1.025)
Urobilinogen, UA: 0.2 U/dL
pH, UA: 7.5 (ref 5.0–8.0)

## 2023-11-19 NOTE — Progress Notes (Signed)
 Established Patient Office Visit     CC/Reason for Visit: Discuss 2 acute concerns  HPI: Stacy Wagner is a 83 y.o. female who is coming in today for the above mentioned reasons.   #1 since the beginning of the year she has been experiencing rhinorrhea, nasal congestion and frontal headaches with a sensation of lightheadedness, not true vertigo.  2.  She has noticed a foul odor to her urine and in the past week has developed dysuria.  Past Medical/Surgical History: Past Medical History:  Diagnosis Date   AC (acromioclavicular) joint bone spurs    Anxiety    Arthritis    lower back (01/15/2018)   Back pain    Bursitis disorder    bil shoulders   Cataracts, bilateral    Chronic lower back pain    Complication of anesthesia    some nausea in past with anesthesia, pt. also recalls having the tube removed too early & her her throat was full of mucous, states that she could move any part of her body)   Family history of adverse reaction to anesthesia    daughter gets PONV (01/15/2018)   GERD (gastroesophageal reflux disease)    Hearing loss    bil / no hearing aids   Hypertension    Hypothyroidism    Idiopathic scoliosis    Leg pain    Lumbar radiculopathy    Lumbar stenosis with neurogenic claudication    Migraine    used to have them all the time; none in 10 yrs (01/15/2018)   Mouth sores    Neck pain    OA (osteoarthritis)    Multiple injections in lumbar region    PONV (postoperative nausea and vomiting)    Prolapse of female bladder, acquired    Sagittal plane imbalance    Skin cancer 11/2017   left thigh   Thyroid  disease    Tongue cancer (HCC)    12-2017    Past Surgical History:  Procedure Laterality Date   ABDOMINAL EXPOSURE N/A 08/04/2019   Procedure: ABDOMINAL EXPOSURE;  Surgeon: Eliza Lonni RAMAN, MD;  Location: [facility] OR;  Service: Vascular;  Laterality: N/A;   ABDOMINAL HYSTERECTOMY     uterine prolapse   ABDOMINOPLASTY      tummy tuck librado excess skin arms and legs   ANTERIOR LATERAL LUMBAR FUSION 4 LEVELS Right 08/04/2019   Procedure: Lumbar One-Two, Lumbar Two-Three, Lumbar Three-Four, Lumbar Four-Five  Anterolateral Lumbar Interbody Fusion;  Surgeon: Unice Pac, MD;  Location: [facility] OR;  Service: Neurosurgery;  Laterality: Right;   ANTERIOR LUMBAR FUSION N/A 08/04/2019   Procedure: Lumbar Five - Sacral One  Anterior Lumbar Interbody Fusion;  Surgeon: Unice Pac, MD;  Location: [facility] OR;  Service: Neurosurgery;  Laterality: N/A;  Dr Lonni Eliza to assist   APPENDECTOMY     APPLICATION OF INTRAOPERATIVE CT SCAN N/A 08/07/2019   Procedure: APPLICATION OF INTRAOPERATIVE CT SCAN;  Surgeon: Unice Pac, MD;  Location: [facility] OR;  Service: Neurosurgery;  Laterality: N/A;   BACK SURGERY     BREAST SURGERY Right 1980   removal of a nodule- post trauma, benign pathology   CARPAL TUNNEL RELEASE Right    CATARACT EXTRACTION, BILATERAL     COLONOSCOPY     GLOSSECTOMY N/A 01/15/2018   Procedure: PARTIAL GLOSSECTOMY;  Surgeon: Ethyl Lonni BRAVO, MD;  Location: [facility] OR;  Service: ENT;  Laterality: N/A;   LUMBAR SPINE SURGERY  1988   /w Dr. Leeann- reports that it was  in the lumbar region, unsure of date.   MINOR EXCISION OF ORAL LESION N/A 06/21/2020   Procedure: EXCISION OF TONGUE LESION WITH CLOSURE;  Surgeon: Ethyl Lonni BRAVO, MD;  Location: [facility];  Service: ENT;  Laterality: N/A;   NASAL POLYP EXCISION     PARTIAL GLOSSECTOMY  01/15/2018   POSTERIOR LUMBAR FUSION 4 LEVEL N/A 08/07/2019   Procedure: Thoracic Ten to pelvis fixation;  Surgeon: Unice Pac, MD;  Location: [facility] OR;  Service: Neurosurgery;  Laterality: N/A;  Thoracic Ten to pelvis fixation   SKIN CANCER EXCISION Left 11/2017   thigh   TONGUE SURGERY     TONSILLECTOMY     TUMMY TUCK      Social History:  reports that she has never smoked. She has never been exposed to tobacco smoke. She has never used smokeless tobacco.  She reports that she does not currently use alcohol. She reports that she does not use drugs.  Allergies: Allergies  Allergen Reactions   Codeine Nausea Only    Family History:  Family History  Problem Relation Age of Onset   Cancer Mother        ?lung cancer   Hypertension Sister    Diabetes Father    Stomach cancer Cousin    Rectal cancer Neg Hx    Esophageal cancer Neg Hx    Colon cancer Neg Hx      Current Outpatient Medications:    acetaminophen  (TYLENOL ) 325 MG tablet, Take 1-2 tablets (325-650 mg total) by mouth every 4 (four) hours as needed for mild pain., Disp: , Rfl:    amLODipine  (NORVASC ) 5 MG tablet, TAKE 1 TABLET(5 MG) BY MOUTH DAILY, Disp: 30 tablet, Rfl: 0   amoxicillin -clavulanate (AUGMENTIN ) 875-125 MG tablet, Take 1 tablet by mouth 2 (two) times daily., Disp: 20 tablet, Rfl: 0   ciclopirox  (LOPROX ) 0.77 % cream, Apply topically 2 (two) times daily., Disp: 90 g, Rfl: 3   cyanocobalamin (VITAMIN B12) 1000 MCG tablet, Take 1,000 mcg by mouth daily., Disp: , Rfl:    diphenhydrAMINE  (BENADRYL ) 25 MG tablet, Take 50 mg by mouth at bedtime., Disp: , Rfl:    estradiol  (ESTRACE ) 0.5 MG tablet, Take 1 tablet (0.5 mg total) by mouth daily., Disp: 90 tablet, Rfl: 3   ibuprofen  (ADVIL ) 200 MG tablet, Take 400 mg by mouth at bedtime., Disp: , Rfl:    levothyroxine  (SYNTHROID ) 75 MCG tablet, Take 1 tablet (75 mcg total) by mouth daily before breakfast., Disp: 90 tablet, Rfl: 3   lidocaine  (XYLOCAINE ) 2 % solution, SMARTSIG:5 Milliliter(s) By Mouth PRN, Disp: , Rfl:    Lysine  1000 MG TABS, Take 500 mg by mouth daily., Disp: , Rfl:    Magnesium 400 MG CAPS, Take 400 mg by mouth at bedtime., Disp: , Rfl:    Multiple Vitamins-Minerals (MULTIVITAMIN WITH MINERALS) tablet, Take 1 tablet by mouth daily., Disp: , Rfl:    ondansetron  (ZOFRAN -ODT) 4 MG disintegrating tablet, Take 1 tablet (4 mg total) by mouth every 8 (eight) hours as needed for nausea or vomiting., Disp: 20 tablet,  Rfl: 0   sertraline  (ZOLOFT ) 50 MG tablet, Take one tablet by mouth once daily., Disp: 90 tablet, Rfl: 3   triamcinolone  cream (KENALOG ) 0.1 %, Apply 1 application topically 2 (two) times daily as needed (irritation)., Disp: 80 g, Rfl: 1   triamterene -hydrochlorothiazide  (MAXZIDE ) 75-50 MG tablet, Take 1 tablet by mouth daily with breakfast., Disp: 90 tablet, Rfl: 3  Review of Systems:  Negative unless  indicated in HPI.   Physical Exam: Vitals:   11/19/23 1516  BP: 120/70  Pulse: 76  Temp: 97.8 F (36.6 C)  TempSrc: Oral  SpO2: 98%  Weight: 180 lb 8 oz (81.9 kg)    Body mass index is 27.44 kg/m.    Impression and Plan:  Dysuria -     POCT Urinalysis Dipstick (Automated)  Sinus congestion   - Suspect her symptoms are related to allergies and sinus congestion and headache.  Have advised daily use of antihistamine as well as Flonase spray. -In office urine dipstick is clear, no need for antibiotics.  Time spent:31 minutes reviewing chart, interviewing and examining patient and formulating plan of care.     Tully Theophilus Andrews, MD [facility]

## 2023-12-09 DIAGNOSIS — M1712 Unilateral primary osteoarthritis, left knee: Secondary | ICD-10-CM | POA: Diagnosis not present

## 2023-12-16 DIAGNOSIS — M1712 Unilateral primary osteoarthritis, left knee: Secondary | ICD-10-CM | POA: Diagnosis not present

## 2023-12-24 DIAGNOSIS — M1712 Unilateral primary osteoarthritis, left knee: Secondary | ICD-10-CM | POA: Diagnosis not present

## 2024-01-03 DIAGNOSIS — M25512 Pain in left shoulder: Secondary | ICD-10-CM | POA: Diagnosis not present

## 2024-01-27 DIAGNOSIS — H40013 Open angle with borderline findings, low risk, bilateral: Secondary | ICD-10-CM | POA: Diagnosis not present

## 2024-01-27 DIAGNOSIS — H04123 Dry eye syndrome of bilateral lacrimal glands: Secondary | ICD-10-CM | POA: Diagnosis not present

## 2024-01-27 DIAGNOSIS — H26493 Other secondary cataract, bilateral: Secondary | ICD-10-CM | POA: Diagnosis not present

## 2024-01-27 DIAGNOSIS — H35363 Drusen (degenerative) of macula, bilateral: Secondary | ICD-10-CM | POA: Diagnosis not present

## 2024-02-04 DIAGNOSIS — Z1231 Encounter for screening mammogram for malignant neoplasm of breast: Secondary | ICD-10-CM | POA: Diagnosis not present

## 2024-02-04 LAB — HM MAMMOGRAPHY

## 2024-02-06 ENCOUNTER — Encounter: Payer: Self-pay | Admitting: Family Medicine
